# Patient Record
Sex: Female | Born: 1950 | Race: White | Hispanic: No | Marital: Married | Smoking: Never smoker
Health system: Southern US, Community
[De-identification: ages and names within clinical notes are randomized; demographics above are authoritative.]

## PROBLEM LIST (undated history)

## (undated) DIAGNOSIS — E781 Pure hyperglyceridemia: Secondary | ICD-10-CM

## (undated) DIAGNOSIS — H269 Unspecified cataract: Secondary | ICD-10-CM

## (undated) DIAGNOSIS — F329 Major depressive disorder, single episode, unspecified: Secondary | ICD-10-CM

## (undated) DIAGNOSIS — Z87442 Personal history of urinary calculi: Secondary | ICD-10-CM

## (undated) DIAGNOSIS — E119 Type 2 diabetes mellitus without complications: Secondary | ICD-10-CM

## (undated) DIAGNOSIS — L409 Psoriasis, unspecified: Secondary | ICD-10-CM

## (undated) DIAGNOSIS — G709 Myoneural disorder, unspecified: Secondary | ICD-10-CM

## (undated) DIAGNOSIS — I10 Essential (primary) hypertension: Secondary | ICD-10-CM

## (undated) DIAGNOSIS — K635 Polyp of colon: Secondary | ICD-10-CM

## (undated) DIAGNOSIS — K760 Fatty (change of) liver, not elsewhere classified: Secondary | ICD-10-CM

## (undated) DIAGNOSIS — K219 Gastro-esophageal reflux disease without esophagitis: Secondary | ICD-10-CM

## (undated) DIAGNOSIS — C801 Malignant (primary) neoplasm, unspecified: Secondary | ICD-10-CM

## (undated) DIAGNOSIS — K589 Irritable bowel syndrome without diarrhea: Secondary | ICD-10-CM

## (undated) DIAGNOSIS — E785 Hyperlipidemia, unspecified: Secondary | ICD-10-CM

## (undated) DIAGNOSIS — E669 Obesity, unspecified: Secondary | ICD-10-CM

## (undated) DIAGNOSIS — C4490 Unspecified malignant neoplasm of skin, unspecified: Secondary | ICD-10-CM

## (undated) DIAGNOSIS — T7840XA Allergy, unspecified, initial encounter: Secondary | ICD-10-CM

## (undated) DIAGNOSIS — R911 Solitary pulmonary nodule: Secondary | ICD-10-CM

## (undated) DIAGNOSIS — J449 Chronic obstructive pulmonary disease, unspecified: Secondary | ICD-10-CM

## (undated) DIAGNOSIS — R011 Cardiac murmur, unspecified: Secondary | ICD-10-CM

## (undated) DIAGNOSIS — M1990 Unspecified osteoarthritis, unspecified site: Secondary | ICD-10-CM

## (undated) DIAGNOSIS — F32A Depression, unspecified: Secondary | ICD-10-CM

## (undated) DIAGNOSIS — F419 Anxiety disorder, unspecified: Secondary | ICD-10-CM

## (undated) DIAGNOSIS — N393 Stress incontinence (female) (male): Secondary | ICD-10-CM

## (undated) HISTORY — DX: Personal history of urinary calculi: Z87.442

## (undated) HISTORY — PX: UPPER GI ENDOSCOPY: SHX6162

## (undated) HISTORY — DX: Cardiac murmur, unspecified: R01.1

## (undated) HISTORY — PX: NASAL SINUS SURGERY: SHX719

## (undated) HISTORY — DX: Allergy, unspecified, initial encounter: T78.40XA

## (undated) HISTORY — DX: Essential (primary) hypertension: I10

## (undated) HISTORY — DX: Irritable bowel syndrome, unspecified: K58.9

## (undated) HISTORY — DX: Myoneural disorder, unspecified: G70.9

## (undated) HISTORY — DX: Depression, unspecified: F32.A

## (undated) HISTORY — DX: Chronic obstructive pulmonary disease, unspecified: J44.9

## (undated) HISTORY — DX: Stress incontinence (female) (male): N39.3

## (undated) HISTORY — PX: TONSILLECTOMY: SUR1361

## (undated) HISTORY — DX: Anxiety disorder, unspecified: F41.9

## (undated) HISTORY — DX: Unspecified cataract: H26.9

## (undated) HISTORY — DX: Obesity, unspecified: E66.9

## (undated) HISTORY — DX: Unspecified osteoarthritis, unspecified site: M19.90

## (undated) HISTORY — DX: Hyperlipidemia, unspecified: E78.5

## (undated) HISTORY — PX: VENTRAL HERNIA REPAIR: SHX424

## (undated) HISTORY — DX: Polyp of colon: K63.5

## (undated) HISTORY — DX: Major depressive disorder, single episode, unspecified: F32.9

## (undated) HISTORY — DX: Psoriasis, unspecified: L40.9

## (undated) HISTORY — PX: SHOULDER SURGERY: SHX246

## (undated) HISTORY — PX: MOUTH SURGERY: SHX715

## (undated) HISTORY — PX: HERNIA REPAIR: SHX51

## (undated) HISTORY — DX: Fatty (change of) liver, not elsewhere classified: K76.0

## (undated) HISTORY — DX: Gastro-esophageal reflux disease without esophagitis: K21.9

---

## 1999-03-19 ENCOUNTER — Other Ambulatory Visit: Admission: RE | Admit: 1999-03-19 | Discharge: 1999-03-19 | Payer: Self-pay | Admitting: *Deleted

## 2000-12-16 ENCOUNTER — Encounter: Admission: RE | Admit: 2000-12-16 | Discharge: 2000-12-16 | Payer: Self-pay | Admitting: Family Medicine

## 2000-12-16 ENCOUNTER — Encounter: Payer: Self-pay | Admitting: Family Medicine

## 2002-05-27 ENCOUNTER — Encounter: Admission: RE | Admit: 2002-05-27 | Discharge: 2002-05-27 | Payer: Self-pay | Admitting: Family Medicine

## 2002-05-27 ENCOUNTER — Encounter: Payer: Self-pay | Admitting: Family Medicine

## 2003-07-15 ENCOUNTER — Encounter: Admission: RE | Admit: 2003-07-15 | Discharge: 2003-07-15 | Payer: Self-pay | Admitting: Family Medicine

## 2003-07-15 ENCOUNTER — Encounter: Payer: Self-pay | Admitting: Family Medicine

## 2003-11-16 ENCOUNTER — Ambulatory Visit (HOSPITAL_COMMUNITY): Admission: RE | Admit: 2003-11-16 | Discharge: 2003-11-16 | Payer: Self-pay | Admitting: Chiropractic Medicine

## 2005-05-30 ENCOUNTER — Ambulatory Visit: Payer: Self-pay | Admitting: Internal Medicine

## 2005-06-03 ENCOUNTER — Ambulatory Visit: Payer: Self-pay | Admitting: Cardiology

## 2005-06-03 ENCOUNTER — Encounter: Payer: Self-pay | Admitting: Internal Medicine

## 2005-06-14 ENCOUNTER — Ambulatory Visit: Payer: Self-pay | Admitting: Internal Medicine

## 2005-06-20 ENCOUNTER — Ambulatory Visit: Payer: Self-pay | Admitting: Internal Medicine

## 2005-07-25 ENCOUNTER — Ambulatory Visit: Payer: Self-pay | Admitting: Internal Medicine

## 2005-12-30 ENCOUNTER — Encounter: Admission: RE | Admit: 2005-12-30 | Discharge: 2005-12-30 | Payer: Self-pay | Admitting: Family Medicine

## 2007-01-01 ENCOUNTER — Encounter: Admission: RE | Admit: 2007-01-01 | Discharge: 2007-01-01 | Payer: Self-pay | Admitting: Family Medicine

## 2008-02-17 ENCOUNTER — Encounter (INDEPENDENT_AMBULATORY_CARE_PROVIDER_SITE_OTHER): Payer: Self-pay | Admitting: General Surgery

## 2008-02-17 ENCOUNTER — Ambulatory Visit (HOSPITAL_COMMUNITY): Admission: RE | Admit: 2008-02-17 | Discharge: 2008-02-18 | Payer: Self-pay | Admitting: General Surgery

## 2008-03-24 ENCOUNTER — Encounter: Payer: Self-pay | Admitting: Internal Medicine

## 2008-05-12 ENCOUNTER — Telehealth: Payer: Self-pay | Admitting: Internal Medicine

## 2008-05-17 DIAGNOSIS — E7800 Pure hypercholesterolemia, unspecified: Secondary | ICD-10-CM | POA: Insufficient documentation

## 2008-05-17 DIAGNOSIS — E785 Hyperlipidemia, unspecified: Secondary | ICD-10-CM | POA: Insufficient documentation

## 2008-05-17 DIAGNOSIS — K7689 Other specified diseases of liver: Secondary | ICD-10-CM

## 2008-05-17 DIAGNOSIS — I10 Essential (primary) hypertension: Secondary | ICD-10-CM | POA: Insufficient documentation

## 2008-05-17 DIAGNOSIS — N39498 Other specified urinary incontinence: Secondary | ICD-10-CM | POA: Insufficient documentation

## 2008-05-17 DIAGNOSIS — K649 Unspecified hemorrhoids: Secondary | ICD-10-CM | POA: Insufficient documentation

## 2008-05-17 DIAGNOSIS — L408 Other psoriasis: Secondary | ICD-10-CM | POA: Insufficient documentation

## 2008-05-17 DIAGNOSIS — R197 Diarrhea, unspecified: Secondary | ICD-10-CM | POA: Insufficient documentation

## 2008-05-17 DIAGNOSIS — K219 Gastro-esophageal reflux disease without esophagitis: Secondary | ICD-10-CM | POA: Insufficient documentation

## 2008-05-17 DIAGNOSIS — M1990 Unspecified osteoarthritis, unspecified site: Secondary | ICD-10-CM | POA: Insufficient documentation

## 2008-05-18 ENCOUNTER — Ambulatory Visit: Payer: Self-pay | Admitting: Internal Medicine

## 2008-05-18 DIAGNOSIS — K589 Irritable bowel syndrome without diarrhea: Secondary | ICD-10-CM | POA: Insufficient documentation

## 2008-06-04 HISTORY — PX: COLONOSCOPY: SHX174

## 2008-06-07 ENCOUNTER — Ambulatory Visit: Payer: Self-pay | Admitting: Internal Medicine

## 2008-06-07 ENCOUNTER — Encounter: Payer: Self-pay | Admitting: Internal Medicine

## 2008-06-07 DIAGNOSIS — K635 Polyp of colon: Secondary | ICD-10-CM

## 2008-06-07 HISTORY — DX: Polyp of colon: K63.5

## 2008-06-09 ENCOUNTER — Encounter: Payer: Self-pay | Admitting: Internal Medicine

## 2008-07-12 ENCOUNTER — Telehealth: Payer: Self-pay | Admitting: Internal Medicine

## 2008-08-01 ENCOUNTER — Ambulatory Visit: Payer: Self-pay | Admitting: Cardiology

## 2008-08-04 ENCOUNTER — Ambulatory Visit: Payer: Self-pay

## 2008-08-18 ENCOUNTER — Ambulatory Visit: Payer: Self-pay | Admitting: Cardiology

## 2008-10-13 ENCOUNTER — Encounter: Admission: RE | Admit: 2008-10-13 | Discharge: 2008-10-13 | Payer: Self-pay | Admitting: Internal Medicine

## 2009-11-13 ENCOUNTER — Encounter: Payer: Self-pay | Admitting: Internal Medicine

## 2010-06-19 ENCOUNTER — Ambulatory Visit: Payer: Self-pay

## 2010-12-06 NOTE — Medication Information (Signed)
Summary: Pantoprazole-Approved/[facility]  Pantoprazole-Approved/[facility]   Imported By: Sherian Rein 11/20/2009 09:50:54  _____________________________________________________________________  External Attachment:    Type:   Image     Comment:   External Document

## 2011-03-19 NOTE — Op Note (Signed)
Abigail [name], Abigail [name] NO.:  [id number]   MEDICAL RECORD NO.:  [id number]          PATIENT TYPE:  AMB   LOCATION:  DAY                          FACILITY:  [facility]   PHYSICIAN:  Adolph Pollack, M.D.DATE OF BIRTH:  Jun 02, 1951   DATE OF PROCEDURE:  02/17/2008  DATE OF DISCHARGE:                               OPERATIVE REPORT   PREOP DIAGNOSIS:  Ventral hernia.   POSTOPERATIVE DIAGNOSIS:  Ventral hernia.   PROCEDURE:  Laparoscopic ventral hernia repair with mesh.   SURGEON:  Adolph Pollack, M.D.   ANESTHESIA:  General.   INDICATIONS:  This is a 60 year old female who has had a primary ventral  hernia containing some fat and mesenteric transverse colon.  It is  becoming larger and she is now presents for repair.  We discussed the  procedure, risks and aftercare preoperatively.   TECHNIQUE:  She is seen in the holding area and then brought to the  operating room, placed supine on the operating room table and a general  anesthetic was administered.  A Foley catheter was placed in the bladder  and the abdominal wall was sterilely prepped and draped.  A 5 mm  incision was made in the left upper quadrant and using a 5 mm OptiVu  trocar access was gained to the peritoneal cavity and pneumoperitoneum  was created.  The laparoscope was inserted and no underlying visceral  injury or bleeding was noted.  I could see the hernia defect in the  epigastric area of abdominal wall containing a part of  mid transverse  colon and omentum.  A 10 mm trocar was placed in the left mid lateral  abdomen and using careful blunt dissection I grasped the omentum and was  able to reduce the hernia contents in its entirety which included part  of the transverse colon as well as omentum.  I then used the harmonic  scalpel to divide the falciform ligament and dissect it free from the  anterior abdominal wall.  I examined the transverse colon and omentum  and no bleeding or bowel injury  was noted.   Using a spinal needle I marked the four quadrants of the hernia and  measured 4 cm away from this.  I then brought a 15 x 20 cm piece of  Parietex mesh with a nonadherent barrier one side with nonadherent  barrier  into the field and cut it to appropriate size and placed four  anchoring sutures of #1 Novofil at the four quadrants of the mesh.  The  mesh was hydrated and then placed into the abdominal cavity and then  positioned so that the rough side was facing the abdominal wall and the  smooth nonadherent side was facing the viscera.  I made four stab  incisions in four quadrants in the abdominal wall and brought up the  anchoring sutures across the fascial bridge and tied these down  initially anchoring the mesh to the abdominal wall with adequate  coverage of the hernia and good overlap.  I then used spiral tacker to  further anchor the mesh with  an outer and inner layer.  I had reduced  and excised some of the hernia sac and this was sent to pathology.   Following this I inspected all quadrants and the hernia repair and the  area underneath it and no bleeding or visceral injury was noted.  I then  released the CO2 gas and watched the mesh approximate the omentum.  The  trocars were removed.   All incisions were then closed with 4-0 Monocryl subcuticular stitches.  Steri-Strips and sterile dressings were applied.   Of note was that she had some problems with her O2 saturations during  anesthesia and had some wheezing.  This was controlled with some  Ventolin.  She subsequently was extubated and taken to the recovery room  in satisfactory condition.      Adolph Pollack, M.D.  Electronically Signed     TJR/MEDQ  D:  02/17/2008  T:  02/17/2008  Job:  [id number]   cc:   Hedwig Morton. Juanda Chance, MD  [address]  [place]  [place] [address]

## 2011-03-19 NOTE — Assessment & Plan Note (Signed)
[facility]                            CARDIOLOGY OFFICE NOTE   Abigail [name], Abigail [name]                        MRN:          [id number]  DATE:08/18/2008                            DOB:          03-28-1951    Abigail [name] returns today for her dyspnea on exertion and multiple  cardiac risk factors.   We performed an exercise rest stress Myoview.  Her EF was 75% with no  scar ischemia.   However, her blood pressure jumped up to 208 systolic with a baseline of  130.  This was within the first stage of her treadmill.  Her exercise  tolerance is also significantly reduced at 5 minutes.   I have explained her that her symptoms of dyspnea on exertion is  multifactorial.  Some of this may be due to the hypertensive response,  but most of this from deconditioning in her weight.   At this point in time, I have added amlodipine 5 mg daily for __________  her hypertensive response.  This will also help her hypertension.  She  remained on Crestor with Dr. Vear Clock, which I highly approved.  She  will also stay on aspirin 81 mg a day.   Her blood pressure today was 135/75, pulse 87 and regular.   PLAN:  1. Add amlodipine 5 mg a day.  2. See [place] back p.r.n.  3. Continued risk factor modification with Dr. Loma Sender.     Thomas C. Daleen Squibb, MD, [facility]  Electronically Signed    TCW/MedQ  DD: 08/18/2008  DT: 08/18/2008  Job #: [id number]   cc:   Loma Sender

## 2011-03-19 NOTE — Assessment & Plan Note (Signed)
[facility]                            CARDIOLOGY OFFICE NOTE   NAME:WYRICKCassiopeia, Florentino                        MRN:          [id number]  DATE:08/01/2008                            DOB:          [birth year]    I was asked by Dr. Loma Sender to consult on [facility] with  dyspnea on exertion and chest pain.   HISTORY OF PRESENT ILLNESS:  Ms. Renbarger is a delightful 60 year old  married white female from [facility] who has been having some dyspnea  on exertion and some atypical chest pain.  She climbs up and down stairs  all day doing laundry.  She said she gets fairly short of breath.   She has had some sharp stabbing pain in the mid sternum and also under  her left breast.  These are usually intermittent, not exertion related,  and last only a few seconds.   She denies any orthopnea, PND, or peripheral edema.   She has multiple cardiac risk factors including age, hypertension,  hyperlipidemia, obesity, and sedentary lifestyle.  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   PAST MEDICAL HISTORY:  She does not smoke.  She does not drink.   She has had no history of DYE allergy.   CURRENT MEDICATIONS:  1. Crestor 10 mg a day.  2. Pantoprazole 40 mg a day.  3. Lisinopril and hydrochlorothiazide 20/12.5 daily.   She has no known drug allergies.   SURGICAL HISTORY:  1. Sinus surgery in 2002.  2. Shoulder surgery in 2007.  3. Abdominal hernia in 2009.   FAMILY HISTORY:  Negative for premature coronary artery disease in first-  degree relatives.   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  She  drives for [employer].  She is married and has 2 children.   REVIEW OF SYSTEMS:  History of seasonal allergies, hay fever,  constipation, stomach and bowel disorder, and reflux.  There is no other  positive review of systems.  Please refer to the HPI for others.   PHYSICAL EXAMINATION:   VITAL SIGNS:  Her blood pressure is 140/80.  Her  pulse is 73 and regular.  Her weight is 222.  She is 5 feet 2.  HEENT:  Normocephalic, atraumatic.  PERRLA.  Extraocular movements  intact.  Sclerae are clear.  Facial symmetry is normal.  NECK:  Carotids were equal bilateral without bruits.  No JVD.  Thyroid  is not enlarged.  Neck is supple.  Oral mucosa is normal.  LUNGS:  Clear to auscultation and percussion.  HEART:  A nondisplaced PMI.  Normal S1 and S2.  No gallop, rub, or  murmur.  ABDOMEN:  Soft.  Good bowel sounds.  No obvious midline bruit or  pulsatile mass.  EXTREMITIES:  Some minimal varicose veins.  No sign of DVT.  Pulses were  brisk, both dorsalis pedis and posterior tibial.  NEURO:  Intact.  SKIN:  Unremarkable.   Electrocardiogram shows sinus rhythm with poor progression in the  anterior pericardium.  ASSESSMENT AND PLAN:  1. Dyspnea on exertion.  2. Atypical chest pain.  3. Ms. Belli has multiple cardiac risk factors.   PLAN:  Exercise rest stress Myoview to reproduce her symptoms to see and  to rule out any obstructive coronary artery disease.     Thomas C. Daleen Squibb, MD, [facility]  Electronically Signed    TCW/MedQ  DD: 08/01/2008  DT: 08/02/2008  Job #: [id number]   cc:   Loma Sender

## 2011-03-20 ENCOUNTER — Ambulatory Visit: Payer: Self-pay | Admitting: Internal Medicine

## 2011-07-30 LAB — CBC
HCT: 41.5
MCV: 85
Platelets: 209
RBC: 4.88
WBC: 7

## 2011-07-30 LAB — DIFFERENTIAL
Basophils Absolute: 0
Basophils Relative: 0
Eosinophils Absolute: 0.2
Eosinophils Relative: 3
Lymphocytes Relative: 29
Lymphs Abs: 2
Monocytes Absolute: 0.5
Monocytes Relative: 7
Neutro Abs: 4.2
Neutrophils Relative %: 61

## 2011-07-30 LAB — COMPREHENSIVE METABOLIC PANEL WITH GFR
ALT: 47 — ABNORMAL HIGH
AST: 45 — ABNORMAL HIGH
Albumin: 3.7
Alkaline Phosphatase: 68
BUN: 11
CO2: 27
Calcium: 9.4
Chloride: 105
Creatinine, Ser: 0.68
GFR calc non Af Amer: >60
Glucose, Bld: 109 — ABNORMAL HIGH
Potassium: 4
Sodium: 141
Total Bilirubin: 1.2
Total Protein: 6.8

## 2011-10-14 ENCOUNTER — Ambulatory Visit (INDEPENDENT_AMBULATORY_CARE_PROVIDER_SITE_OTHER): Payer: BC Managed Care – PPO

## 2011-10-14 DIAGNOSIS — R0602 Shortness of breath: Secondary | ICD-10-CM

## 2011-10-14 DIAGNOSIS — K219 Gastro-esophageal reflux disease without esophagitis: Secondary | ICD-10-CM

## 2011-10-14 DIAGNOSIS — R05 Cough: Secondary | ICD-10-CM

## 2012-04-10 ENCOUNTER — Other Ambulatory Visit: Payer: Self-pay | Admitting: Physician Assistant

## 2012-04-10 ENCOUNTER — Ambulatory Visit (INDEPENDENT_AMBULATORY_CARE_PROVIDER_SITE_OTHER): Payer: BC Managed Care – PPO | Admitting: Family Medicine

## 2012-04-10 VITALS — BP 150/81 | HR 96 | Temp 98.6°F | Resp 18 | Ht 64.0 in | Wt 214.0 lb

## 2012-04-10 DIAGNOSIS — L408 Other psoriasis: Secondary | ICD-10-CM

## 2012-04-10 DIAGNOSIS — L409 Psoriasis, unspecified: Secondary | ICD-10-CM

## 2012-04-10 DIAGNOSIS — K219 Gastro-esophageal reflux disease without esophagitis: Secondary | ICD-10-CM

## 2012-04-10 DIAGNOSIS — J029 Acute pharyngitis, unspecified: Secondary | ICD-10-CM

## 2012-04-10 DIAGNOSIS — R509 Fever, unspecified: Secondary | ICD-10-CM

## 2012-04-10 DIAGNOSIS — K589 Irritable bowel syndrome without diarrhea: Secondary | ICD-10-CM

## 2012-04-10 DIAGNOSIS — J329 Chronic sinusitis, unspecified: Secondary | ICD-10-CM

## 2012-04-10 MED ORDER — OMEPRAZOLE 20 MG PO CPDR: 20.0000 mg | DELAYED_RELEASE_CAPSULE | Freq: Every day | ORAL | Status: DC

## 2012-04-10 MED ORDER — DICYCLOMINE HCL 20 MG PO TABS: 20.0000 mg | ORAL_TABLET | Freq: Four times a day (QID) | ORAL | Status: DC

## 2012-04-10 MED ORDER — AMOXICILLIN 875 MG PO TABS: 875.0000 mg | ORAL_TABLET | Freq: Two times a day (BID) | ORAL | Status: AC

## 2012-04-10 MED ORDER — BETAMETHASONE DIPROPIONATE 0.05 % EX LOTN: TOPICAL_LOTION | CUTANEOUS | Status: DC

## 2012-04-10 NOTE — Progress Notes (Signed)
This 60 year old woman with several problems. First of all she complains of fever overnight with sore throat and cough. She says that her throat and bronchial tubes overall. She's had a cough in the past and been diagnosed with pneumonia but has been doing well since she had her GERD treated by Dr. Chilton Si.  Patient also has IBS and would like a refill on his eye Cyclomen. She's had minimal nausea, no blood per rectum, and no abdominal pain.  Objective: Patient is in no acute distress HEENT: Unremarkable except for mucopurulent nasal discharge, she does have a rash in her left and right ear canals with scaling Chest: Clear to auscultation Heart: Regular no murmur Abdomen: Soft nontender Extremities: No edema Skin: Diffuse psoriatic rash.  Assessment: Mild psoriasis, bothersome in the ear on the right. URI symptoms consistent with the onset of bronchitis which she's had repeatedly in the past. GERD is adequately treated with the omeprazole and the IBS is adequately treated with the dicyclomine.  Plan: Amoxicillin for the upper respiratory infection symptoms Refill dicyclomine and omeprazole Betamethasone solution to the ear for psoriasis 1. GERD (gastroesophageal reflux disease)  omeprazole (PRILOSEC) 20 MG capsule  2. IBS (irritable bowel syndrome)  dicyclomine (BENTYL) 20 MG tablet  3. Sinusitis  amoxicillin (AMOXIL) 875 MG tablet  4. Fever  amoxicillin (AMOXIL) 875 MG tablet  5. Sore throat    6. Psoriasis  betamethasone dipropionate 0.05 % lotion

## 2012-04-15 NOTE — Progress Notes (Signed)
Completed prior auth for pt's omeprazole over the phone and received approval through 04/15/13. Faxed approval notice to pharm

## 2012-07-13 ENCOUNTER — Telehealth: Payer: Self-pay | Admitting: Internal Medicine

## 2012-07-13 DIAGNOSIS — Z8719 Personal history of other diseases of the digestive system: Secondary | ICD-10-CM

## 2012-07-13 DIAGNOSIS — K589 Irritable bowel syndrome without diarrhea: Secondary | ICD-10-CM

## 2012-07-13 DIAGNOSIS — K760 Fatty (change of) liver, not elsewhere classified: Secondary | ICD-10-CM

## 2012-07-13 NOTE — Telephone Encounter (Signed)
Please get CBC,C-met, amy, lipase before her appointment

## 2012-07-13 NOTE — Telephone Encounter (Signed)
Left a message for patient to call me. 

## 2012-07-13 NOTE — Telephone Encounter (Signed)
Patient calling to report pain in stomach LUQ , acid reflux and diarrhea alternating with constipation. States she may have diarrhea then is ok for 2-3 weeks then it comes back. She takes Dicyclomine prn for diarrhea which helps. She is taking Zantac prn acid reflux. She is interested in getting OV set up to get "check out and get medications refills" since she has not been here is quiet some time.Hx IBS, GERD Scheduled patient on 07/28/12 at 2:30 PM with Dr. Juanda Chance. Does she need any labs prior to OV?

## 2012-07-14 NOTE — Telephone Encounter (Signed)
Labs in [facility] and patient aware.

## 2012-07-17 ENCOUNTER — Encounter: Payer: Self-pay | Admitting: *Deleted

## 2012-07-23 ENCOUNTER — Other Ambulatory Visit (INDEPENDENT_AMBULATORY_CARE_PROVIDER_SITE_OTHER): Payer: Self-pay

## 2012-07-23 DIAGNOSIS — K589 Irritable bowel syndrome without diarrhea: Secondary | ICD-10-CM

## 2012-07-23 DIAGNOSIS — Z8719 Personal history of other diseases of the digestive system: Secondary | ICD-10-CM

## 2012-07-23 DIAGNOSIS — K760 Fatty (change of) liver, not elsewhere classified: Secondary | ICD-10-CM

## 2012-07-23 DIAGNOSIS — K7689 Other specified diseases of liver: Secondary | ICD-10-CM

## 2012-07-23 LAB — CBC WITH DIFFERENTIAL/PLATELET
Basophils Absolute: 0 10*3/uL (ref 0.0–0.1)
Lymphocytes Relative: 31.9 % (ref 12.0–46.0)
Lymphs Abs: 2.3 10*3/uL (ref 0.7–4.0)
Monocytes Relative: 7.5 % (ref 3.0–12.0)
Neutrophils Relative %: 54.9 % (ref 43.0–77.0)
Platelets: 197 10*3/uL (ref 150.0–400.0)
RDW: 12.8 % (ref 11.5–14.6)
WBC: 7.1 10*3/uL (ref 4.5–10.5)

## 2012-07-23 LAB — COMPREHENSIVE METABOLIC PANEL
ALT: 39 U/L — ABNORMAL HIGH (ref 0–35)
Albumin: 3.9 g/dL (ref 3.5–5.2)
CO2: 25 mEq/L (ref 19–32)
Chloride: 104 mEq/L (ref 96–112)
GFR: 86.92 mL/min (ref >60.00)
Potassium: 3.6 mEq/L (ref 3.5–5.1)
Sodium: 139 mEq/L (ref 135–145)
Total Bilirubin: 0.7 mg/dL (ref 0.3–1.2)
Total Protein: 6.8 g/dL (ref 6.0–8.3)

## 2012-07-28 ENCOUNTER — Ambulatory Visit (INDEPENDENT_AMBULATORY_CARE_PROVIDER_SITE_OTHER): Payer: Self-pay | Admitting: Internal Medicine

## 2012-07-28 ENCOUNTER — Encounter: Payer: Self-pay | Admitting: Internal Medicine

## 2012-07-28 VITALS — BP 170/88 | HR 96 | Ht 62.75 in | Wt 218.0 lb

## 2012-07-28 DIAGNOSIS — K760 Fatty (change of) liver, not elsewhere classified: Secondary | ICD-10-CM

## 2012-07-28 DIAGNOSIS — K219 Gastro-esophageal reflux disease without esophagitis: Secondary | ICD-10-CM

## 2012-07-28 DIAGNOSIS — K7689 Other specified diseases of liver: Secondary | ICD-10-CM

## 2012-07-28 DIAGNOSIS — R932 Abnormal findings on diagnostic imaging of liver and biliary tract: Secondary | ICD-10-CM

## 2012-07-28 DIAGNOSIS — K3189 Other diseases of stomach and duodenum: Secondary | ICD-10-CM

## 2012-07-28 MED ORDER — FLUCONAZOLE 100 MG PO TABS: 100.0000 mg | ORAL_TABLET | Freq: Every day | ORAL | Status: DC

## 2012-07-28 MED ORDER — OMEPRAZOLE 40 MG PO CPDR: 40.0000 mg | DELAYED_RELEASE_CAPSULE | Freq: Every day | ORAL | Status: DC

## 2012-07-28 NOTE — Progress Notes (Signed)
Abigail [name] [birth year] MRN [id number]  History of Present Illness:  This is a 61 year old white female with an gastroesophageal reflux exacerbation  with dyspepsia, bloating, cough and heartburn. She also has a history of irritable bowel syndrome with alternating diarrhea and constipation for which she takes Bentyl 20 mg twice a day. She was given Prilosec 20 mg a day by Dr. Chilton [name] (out of the prescription). She is currently taking ranitidine 150 mg when necessary. We have seen her in the past for ventral hernia which was repaired by Dr. Abbey [name] in 2009. She had a colonoscopy in August 2009 with findings of a hyperplastic polyp and lipoma. A CT scan of the abdomen in July 2006 showed fatty liver and a left renal adenoma as well as a thickened gallbladder wall, supraventral umbilical hernia and right inguinal hernia. Her recent blood tests showed mild elevation of AST at 37 and ALT at 39, with normal hemoglobin of 15 and hematocrit at 41.5 and normal amylase and lipase.   Past Medical History  Diagnosis Date  . Hemorrhoids   . Psoriasis   . Arthritis   . Female stress incontinence   . Fatty liver   . Hypertension   . Hyperlipidemia   . GERD (gastroesophageal reflux disease)   . IBS (irritable bowel syndrome)   . Hyperplastic colon polyp   . COPD (chronic obstructive pulmonary disease)   . Obesity    Past Surgical History  Procedure Date  . Shoulder surgery     right  . Ventral hernia repair   . Nasal sinus surgery   . Tonsillectomy   . Mouth surgery     reports that she has never smoked. She has never used smokeless tobacco. She reports that she does not drink alcohol or use illicit drugs. family history includes Breast cancer in her mother; Colon polyps in her maternal aunt; and Heart disease in her maternal grandfather. No Known Allergies      Review of Systems: Denies dysphagia. Positive for cough and regurgitation. Positive for diarrhea constipation  The remainder of the  10 point ROS is negative except as outlined in H&P   Physical Exam: General appearance  Well developed, in no distress. Overweight Eyes- non icteric. HEENT nontraumatic, normocephalic. Mouth no lesions, tongue papillated, no cheilosis. Neck supple without adenopathy, thyroid not enlarged, no carotid bruits, no JVD. Lungs Clear to auscultation bilaterally. Cor normal S1, normal S2, regular rhythm, no murmur,  quiet precordium. Abdomen: Obese, soft with tenderness in midepigastrium and left lower quadrant. Normoactive bowel sounds. No mass. Rectal: External hemorrhoidal tags tender anal canal. Soft Hemoccult negative stool. Extremities no pedal edema. Skin psoriasis on the buttocks and the abdomen, extensive candida dermatitis under both breast  Neurological alert and oriented x 3. Psychological normal mood and affect.  Assessment and Plan:  Problem #1 Increased gastroesophageal reflux poorly controlled on ranitidine 150 mg a day. We will switch her to omeprazole 40 mg daily and have advised her to follow strict antireflux measures which includes weight loss. We will schedule her for an upper endoscopy. Because of bloating and dyspepsia and abnormal thickened gallbladder wall on a previous CT scan of the abdomen, we will proceed with an upper abdominal ultrasound.  Problem #2 Fatty liver by history documented on a CT scan of the abdomen in 2006. Mild elevation of transaminases are consistent with fatty liver. There are no stigmata of chronic liver disease on her physical exam.  Problem #3 Colorectal screening. Her last colonoscopy was in  August 2009. Patient had a hyperplastic polyp. A recall colonoscopy will be due 7-10 years from that date.  Problem #4 Irritable bowel syndrome. Patient is to continue Bentyl 20 mg twice a day.    07/28/2012 Abigail [name]

## 2012-07-28 NOTE — Patient Instructions (Addendum)
You have been scheduled for an endoscopy with propofol. Please follow written instructions given to you at your visit today. If you use inhalers (even only as needed), please bring them with you on the day of your procedure. You have been scheduled for an abdominal ultrasound at [facility] (1st floor of hospital) on 07-31-2012 at 9:30 am. Please arrive 15 minutes prior to your appointment for registration. Make certain not to have anything to eat or drink 6 hours prior to your appointment. Should you need to reschedule your appointment, please contact radiology at [phone]. We have sent the following medications to your pharmacy for you to pick up at your convenience:Diflucan and Omeprazole     CC: Dr Loma Sender, Dr Denny Levy

## 2012-07-31 ENCOUNTER — Ambulatory Visit (HOSPITAL_COMMUNITY)
Admission: RE | Admit: 2012-07-31 | Discharge: 2012-07-31 | Disposition: A | Discharge status: Home / Self Care | Payer: BC Managed Care – PPO | Source: Ambulatory Visit | Attending: Internal Medicine | Admitting: Internal Medicine

## 2012-07-31 DIAGNOSIS — K7689 Other specified diseases of liver: Secondary | ICD-10-CM | POA: Insufficient documentation

## 2012-07-31 DIAGNOSIS — R1013 Epigastric pain: Secondary | ICD-10-CM

## 2012-07-31 DIAGNOSIS — R932 Abnormal findings on diagnostic imaging of liver and biliary tract: Secondary | ICD-10-CM

## 2012-07-31 DIAGNOSIS — K219 Gastro-esophageal reflux disease without esophagitis: Secondary | ICD-10-CM

## 2012-08-12 ENCOUNTER — Ambulatory Visit (AMBULATORY_SURGERY_CENTER): Payer: BC Managed Care – PPO | Admitting: Internal Medicine

## 2012-08-12 ENCOUNTER — Other Ambulatory Visit: Payer: Self-pay | Admitting: Internal Medicine

## 2012-08-12 ENCOUNTER — Encounter: Payer: Self-pay | Admitting: Internal Medicine

## 2012-08-12 VITALS — BP 153/87 | HR 75 | Temp 98.6°F | Resp 16 | Ht 62.0 in | Wt 218.0 lb

## 2012-08-12 DIAGNOSIS — K219 Gastro-esophageal reflux disease without esophagitis: Secondary | ICD-10-CM

## 2012-08-12 DIAGNOSIS — K2960 Other gastritis without bleeding: Secondary | ICD-10-CM

## 2012-08-12 DIAGNOSIS — K2900 Acute gastritis without bleeding: Secondary | ICD-10-CM

## 2012-08-12 MED ORDER — SODIUM CHLORIDE 0.9 % IV SOLN: 500.0000 mL | INTRAVENOUS | Status: DC

## 2012-08-12 NOTE — Progress Notes (Signed)
Patient did not experience any of the following events: a burn prior to discharge; a fall within the facility; wrong site/side/patient/procedure/implant event; or a hospital transfer or hospital admission upon discharge from the facility. (G8907) Patient did not have preoperative order for IV antibiotic SSI prophylaxis. (G8918)  

## 2012-08-12 NOTE — Patient Instructions (Addendum)
Handouts were given to your care partner on gastritis and GERD.  You may resume your medications today.  Please call if any questions or concerns.  Continue your acid reducing medication per Dr. Juanda Chance.    YOU HAD AN ENDOSCOPIC PROCEDURE TODAY AT THE [facility]: Refer to the procedure report that was given to you for any specific questions about what was found during the examination.  If the procedure report does not answer your questions, please call your gastroenterologist to clarify.  If you requested that your care partner not be given the details of your procedure findings, then the procedure report has been included in a sealed envelope for you to review at your convenience later.  YOU SHOULD EXPECT: Some feelings of bloating in the abdomen. Passage of more gas than usual.  Walking can help get rid of the air that was put into your GI tract during the procedure and reduce the bloating. If you had a lower endoscopy (such as a colonoscopy or flexible sigmoidoscopy) you may notice spotting of blood in your stool or on the toilet paper. If you underwent a bowel prep for your procedure, then you may not have a normal bowel movement for a few days.  DIET: Your first meal following the procedure should be a light meal and then it is ok to progress to your normal diet.  A half-sandwich or bowl of soup is an example of a good first meal.  Heavy or fried foods are harder to digest and may make you feel nauseous or bloated.  Likewise meals heavy in dairy and vegetables can cause extra gas to form and this can also increase the bloating.  Drink plenty of fluids but you should avoid alcoholic beverages for 24 hours.  ACTIVITY: Your care partner should take you home directly after the procedure.  You should plan to take it easy, moving slowly for the rest of the day.  You can resume normal activity the day after the procedure however you should NOT DRIVE or use heavy machinery for 24 hours (because  of the sedation medicines used during the test).    SYMPTOMS TO REPORT IMMEDIATELY: A gastroenterologist can be reached at any hour.  During normal business hours, 8:30 AM to 5:00 PM Monday through Friday, call [phone].  After hours and on weekends, please call the GI answering service at [phone] who will take a message and have the physician on call contact you.     Following upper endoscopy (EGD)  Vomiting of blood or coffee ground material  New chest pain or pain under the shoulder blades  Painful or persistently difficult swallowing  New shortness of breath  Fever of 100F or higher  Black, tarry-looking stools  FOLLOW UP: If any biopsies were taken you will be contacted by phone or by letter within the next 1-3 weeks.  Call your gastroenterologist if you have not heard about the biopsies in 3 weeks.  Our staff will call the home number listed on your records the next business day following your procedure to check on you and address any questions or concerns that you may have at that time regarding the information given to you following your procedure. This is a courtesy call and so if there is no answer at the home number and we have not heard from you through the emergency physician on call, we will assume that you have returned to your regular daily activities without incident.  SIGNATURES/CONFIDENTIALITY: You and/or your  care partner have signed paperwork which will be entered into your electronic medical record.  These signatures attest to the fact that that the information above on your After Visit Summary has been reviewed and is understood.  Full responsibility of the confidentiality of this discharge information lies with you and/or your care-partner.

## 2012-08-12 NOTE — Op Note (Signed)
[facility] [address] [place], [address]   ENDOSCOPY PROCEDURE REPORT  PATIENT: Modest, Draeger  MR#: [id number] BIRTHDATE: 11/05/1950 , 61  yrs. old GENDER: Female ENDOSCOPIST: Hart Carwin, MD REFERRED BY:  Elvina Sidle, M.D. PROCEDURE DATE:  08/12/2012 PROCEDURE:  EGD w/ biopsy ASA CLASS:     Class II INDICATIONS:  dyspepsia.   heartburn.   improved since Prilosec 40 mg started, abd.  sono is negative. MEDICATIONS: MAC sedation, administered by CRNA and Propofol (Diprivan) 250 mg IV TOPICAL ANESTHETIC: Cetacaine Spray  DESCRIPTION OF PROCEDURE: After the risks benefits and alternatives of the procedure were thoroughly explained, informed consent was obtained.  The LB GIF-H180 [id number] endoscope was introduced through the mouth and advanced to the second portion of the duodenum. Without limitations.  The instrument was slowly withdrawn as the mucosa was fully examined.    Esophagus  Irregular z-line. , biopsies taken to r/o barrett's esophagus  STOMACH: Mild gastritis (inflammation) was found.  A biopsy was performed using cold forceps.  Sample obtained for helicobacter pylori testing.  Retroflexed views revealed no abnormalities. The scope was then withdrawn from the patient and the procedure completed.  COMPLICATIONS: There were no complications. ENDOSCOPIC IMPRESSION: 1.   Irregular z-line , biopsies taken to r/o barrett's esophagus 2.   Gastritis (inflammation) was found; biopsy to r/o H.pylori  RECOMMENDATIONS: 1.  Await pathology results 2.  continue PPI 3.  anti-reflux regimen to be follow  REPEAT EXAM:    no  recall  eSigned:  Hart Carwin, MD 08/12/2012 8:33 AM   CC:  PATIENT NAME:  Daryn, Pisani MR#: [id number]

## 2012-08-12 NOTE — Progress Notes (Signed)
No complaints noted in the recovery room. Maw   

## 2012-08-13 ENCOUNTER — Telehealth: Payer: Self-pay | Admitting: *Deleted

## 2012-08-13 NOTE — Telephone Encounter (Signed)
No identifier, no answer. Message left to call if questions or concerns.

## 2012-08-17 ENCOUNTER — Encounter: Payer: Self-pay | Admitting: Internal Medicine

## 2012-10-05 ENCOUNTER — Ambulatory Visit (HOSPITAL_COMMUNITY)
Admission: RE | Admit: 2012-10-05 | Discharge: 2012-10-05 | Disposition: A | Discharge status: Home / Self Care | Payer: BC Managed Care – PPO | Source: Ambulatory Visit | Attending: Family Medicine | Admitting: Family Medicine

## 2012-10-05 ENCOUNTER — Ambulatory Visit (INDEPENDENT_AMBULATORY_CARE_PROVIDER_SITE_OTHER): Payer: BC Managed Care – PPO | Admitting: Family Medicine

## 2012-10-05 ENCOUNTER — Other Ambulatory Visit: Payer: Self-pay | Admitting: *Deleted

## 2012-10-05 VITALS — BP 160/96 | HR 87 | Temp 98.1°F | Resp 18 | Wt 218.0 lb

## 2012-10-05 DIAGNOSIS — M79609 Pain in unspecified limb: Secondary | ICD-10-CM | POA: Insufficient documentation

## 2012-10-05 DIAGNOSIS — M79669 Pain in unspecified lower leg: Secondary | ICD-10-CM

## 2012-10-05 DIAGNOSIS — S86819A Strain of other muscle(s) and tendon(s) at lower leg level, unspecified leg, initial encounter: Secondary | ICD-10-CM

## 2012-10-05 DIAGNOSIS — S86119A Strain of other muscle(s) and tendon(s) of posterior muscle group at lower leg level, unspecified leg, initial encounter: Secondary | ICD-10-CM

## 2012-10-05 DIAGNOSIS — S838X9A Sprain of other specified parts of unspecified knee, initial encounter: Secondary | ICD-10-CM

## 2012-10-05 MED ORDER — TRAMADOL HCL 50 MG PO TABS: 50.0000 mg | ORAL_TABLET | Freq: Three times a day (TID) | ORAL | Status: DC | PRN

## 2012-10-05 NOTE — Patient Instructions (Signed)
Go to come in hospital 11:15 AM to get your ultrasound  Be cautious with avoiding excessive use of the leg, especially on stairs. Use a cane if necessary.  If the ibuprofen upsets her stomach, switch to Tylenol extra strength 2 tablets 3 times daily.  Use the tramadol for severe pain only  Return at any time if worse, otherwise in about one week if you're not improving.

## 2012-10-05 NOTE — Progress Notes (Signed)
Subjective: Patient has been having problems with some tightness of the muscle in the right calf. 9 days ago she ran up a bank in front of her house and felt a pop in the calf. It is continued to hurt quite a lot. It aches when she is often at night a little bit worse. She has no history of blood clots.  Objective: Medial thighs nontender. The calf is quite tender, especially of the low to mid gastrocnemius muscle. There is bruising down around the ankle. She can dorsiflex without problems, but plantar flexion causes pain. Motor strength is good  Assessment: Tear of gastrocnemius muscle Calf pain  Plan: Ultrasound calf to make sure she does not have a clot since she continues to hurt her  She has a history of gastritis, has been taking one ibuprofen 3 times a day. I told her she can do that but it might tend to upset her stomach if we put her on a higher dose of it. Therefore give her some Tylenol for when it is hurting badly. Advance activity cautiously. She is 24 steps or home, so needs to be slow and careful with this

## 2012-10-05 NOTE — Progress Notes (Signed)
VASCULAR LAB PRELIMINARY  PRELIMINARY  PRELIMINARY  PRELIMINARY  Right lower extremity venous Doppler completed.    Preliminary report:  There is no DVT or SVT noted in the right lower extremity.  There is a possible muscle tear mid calf with no active bleeding noted.  Katharine Rochefort, 10/05/2012, 11:56 AM

## 2012-10-09 ENCOUNTER — Telehealth: Payer: Self-pay | Admitting: Internal Medicine

## 2012-10-09 NOTE — Telephone Encounter (Signed)
Left a message for patient to call me. 

## 2012-10-09 NOTE — Telephone Encounter (Signed)
I agree with increasing Prilosec to 20 mg po bid, #60, 3 refills.

## 2012-10-09 NOTE — Telephone Encounter (Signed)
Patient had EGD on 08/12/12 and was told she had gastritis. She has been taking Prilosec 40 mg/daily. She is having acid reflux and nausea during the night. She reports one episode of reflux during the day. She will increase her Prilosec to BID. Please, advise of any other suggestions.

## 2012-10-12 MED ORDER — OMEPRAZOLE 40 MG PO CPDR: 40.0000 mg | DELAYED_RELEASE_CAPSULE | Freq: Two times a day (BID) | ORAL | Status: DC

## 2012-10-12 NOTE — Telephone Encounter (Signed)
Patient notified of Dr. Brodie's recommendations. 

## 2012-10-12 NOTE — Telephone Encounter (Signed)
Left a message for patient to call me. New rx sent to patient's pharmacy.

## 2013-02-18 ENCOUNTER — Telehealth: Payer: Self-pay | Admitting: Internal Medicine

## 2013-02-18 NOTE — Telephone Encounter (Signed)
Patient wants to decrease to Prilosec daily instead of BID.  She will try this and if this does not help she will return to BID

## 2013-05-20 ENCOUNTER — Ambulatory Visit (INDEPENDENT_AMBULATORY_CARE_PROVIDER_SITE_OTHER): Payer: BC Managed Care – PPO | Admitting: Family Medicine

## 2013-05-20 ENCOUNTER — Observation Stay (HOSPITAL_COMMUNITY)
Admission: EM | Admit: 2013-05-20 | Discharge: 2013-05-21 | Disposition: A | Discharge status: Home / Self Care | Payer: BC Managed Care – PPO | Attending: Cardiovascular Disease | Admitting: Cardiovascular Disease

## 2013-05-20 ENCOUNTER — Emergency Department (HOSPITAL_COMMUNITY): Payer: BC Managed Care – PPO

## 2013-05-20 ENCOUNTER — Observation Stay (HOSPITAL_COMMUNITY): Payer: BC Managed Care – PPO

## 2013-05-20 ENCOUNTER — Ambulatory Visit: Payer: BC Managed Care – PPO

## 2013-05-20 ENCOUNTER — Encounter (HOSPITAL_COMMUNITY): Payer: Self-pay | Admitting: *Deleted

## 2013-05-20 VITALS — BP 148/88 | HR 90 | Temp 97.9°F | Resp 18 | Ht 62.0 in | Wt 214.2 lb

## 2013-05-20 DIAGNOSIS — Z7982 Long term (current) use of aspirin: Secondary | ICD-10-CM | POA: Insufficient documentation

## 2013-05-20 DIAGNOSIS — R7989 Other specified abnormal findings of blood chemistry: Secondary | ICD-10-CM | POA: Diagnosis present

## 2013-05-20 DIAGNOSIS — H9209 Otalgia, unspecified ear: Secondary | ICD-10-CM | POA: Insufficient documentation

## 2013-05-20 DIAGNOSIS — R911 Solitary pulmonary nodule: Secondary | ICD-10-CM | POA: Insufficient documentation

## 2013-05-20 DIAGNOSIS — Z79899 Other long term (current) drug therapy: Secondary | ICD-10-CM | POA: Insufficient documentation

## 2013-05-20 DIAGNOSIS — R079 Chest pain, unspecified: Secondary | ICD-10-CM

## 2013-05-20 DIAGNOSIS — R0602 Shortness of breath: Secondary | ICD-10-CM | POA: Insufficient documentation

## 2013-05-20 DIAGNOSIS — R42 Dizziness and giddiness: Secondary | ICD-10-CM

## 2013-05-20 DIAGNOSIS — R202 Paresthesia of skin: Secondary | ICD-10-CM

## 2013-05-20 DIAGNOSIS — H9203 Otalgia, bilateral: Secondary | ICD-10-CM

## 2013-05-20 DIAGNOSIS — R0789 Other chest pain: Principal | ICD-10-CM | POA: Insufficient documentation

## 2013-05-20 DIAGNOSIS — J449 Chronic obstructive pulmonary disease, unspecified: Secondary | ICD-10-CM | POA: Insufficient documentation

## 2013-05-20 DIAGNOSIS — I10 Essential (primary) hypertension: Secondary | ICD-10-CM

## 2013-05-20 DIAGNOSIS — J4489 Other specified chronic obstructive pulmonary disease: Secondary | ICD-10-CM | POA: Insufficient documentation

## 2013-05-20 DIAGNOSIS — R9431 Abnormal electrocardiogram [ECG] [EKG]: Secondary | ICD-10-CM

## 2013-05-20 DIAGNOSIS — E781 Pure hyperglyceridemia: Secondary | ICD-10-CM | POA: Insufficient documentation

## 2013-05-20 DIAGNOSIS — R739 Hyperglycemia, unspecified: Secondary | ICD-10-CM

## 2013-05-20 HISTORY — DX: Malignant (primary) neoplasm, unspecified: C80.1

## 2013-05-20 HISTORY — DX: Unspecified malignant neoplasm of skin, unspecified: C44.90

## 2013-05-20 HISTORY — DX: Pure hyperglyceridemia: E78.1

## 2013-05-20 HISTORY — DX: Solitary pulmonary nodule: R91.1

## 2013-05-20 HISTORY — DX: Type 2 diabetes mellitus without complications: E11.9

## 2013-05-20 LAB — CBC WITH DIFFERENTIAL/PLATELET
Basophils Relative: 0 % (ref 0–1)
Eosinophils Absolute: 0.2 10*3/uL (ref 0.0–0.7)
HCT: 40.1 % (ref 36.0–46.0)
Hemoglobin: 14.7 g/dL (ref 12.0–15.0)
MCH: 30.5 pg (ref 26.0–34.0)
MCHC: 36.7 g/dL — ABNORMAL HIGH (ref 30.0–36.0)
Monocytes Absolute: 0.4 10*3/uL (ref 0.1–1.0)
Monocytes Relative: 7 % (ref 3–12)
Neutrophils Relative %: 54 % (ref 43–77)
RDW: 12.3 % (ref 11.5–15.5)

## 2013-05-20 LAB — BASIC METABOLIC PANEL
BUN: 11 mg/dL (ref 6–23)
Creatinine, Ser: 0.54 mg/dL (ref 0.50–1.10)
GFR calc non Af Amer: >90 mL/min (ref >90)

## 2013-05-20 LAB — POCT GLYCOSYLATED HEMOGLOBIN (HGB A1C): Hemoglobin A1C: 6.6

## 2013-05-20 LAB — LIPID PANEL
HDL: 41 mg/dL (ref >39)
Total CHOL/HDL Ratio: 6.5 Ratio

## 2013-05-20 LAB — POCT CBC
Lymph, poc: 2 (ref 0.6–3.4)
MCH, POC: 29.7 pg (ref 27–31.2)
MCHC: 33.1 g/dL (ref 31.8–35.4)
MID (cbc): 0.4 (ref 0–0.9)
MPV: 10.2 fL (ref 0–99.8)
POC MID %: 7.1 %M (ref 0–12)
Platelet Count, POC: 191 10*3/uL (ref 142–424)
WBC: 5.9 10*3/uL (ref 4.6–10.2)

## 2013-05-20 LAB — COMPREHENSIVE METABOLIC PANEL
ALT: 28 U/L (ref 0–35)
AST: 30 U/L (ref 0–37)
CO2: 25 mEq/L (ref 19–32)
Creat: 0.63 mg/dL (ref 0.50–1.10)
Total Bilirubin: 0.8 mg/dL (ref 0.3–1.2)

## 2013-05-20 LAB — GLUCOSE, CAPILLARY: Glucose-Capillary: 121 mg/dL — ABNORMAL HIGH (ref 70–99)

## 2013-05-20 LAB — D-DIMER, QUANTITATIVE: D-Dimer, Quant: 1.51 ug/mL-FEU — ABNORMAL HIGH (ref 0.00–0.48)

## 2013-05-20 LAB — TROPONIN I
Troponin I: <0.3 ng/mL (ref <0.30)
Troponin I: <0.3 ng/mL (ref <0.30)

## 2013-05-20 LAB — TSH: TSH: 1.088 u[IU]/mL (ref 0.350–4.500)

## 2013-05-20 LAB — POCT I-STAT TROPONIN I: Troponin i, poc: 0 ng/mL (ref 0.00–0.08)

## 2013-05-20 MED ORDER — AMOXICILLIN-POT CLAVULANATE 875-125 MG PO TABS
1.0000 | ORAL_TABLET | Freq: Two times a day (BID) | ORAL | Status: DC
  Administered 2013-05-20 – 2013-05-21 (×2): 1 via ORAL
  Filled 2013-05-20 (×3): qty 1

## 2013-05-20 MED ORDER — AMOXICILLIN 250 MG PO CHEW
875.0000 mg | CHEWABLE_TABLET | Freq: Two times a day (BID) | ORAL | Status: DC
  Filled 2013-05-20: qty 4

## 2013-05-20 MED ORDER — ASPIRIN 81 MG PO TABS: 81.0000 mg | ORAL_TABLET | Freq: Every day | ORAL | Status: DC

## 2013-05-20 MED ORDER — ASPIRIN EC 81 MG PO TBEC
81.0000 mg | DELAYED_RELEASE_TABLET | Freq: Every day | ORAL | Status: DC
  Administered 2013-05-21: 81 mg via ORAL
  Filled 2013-05-20: qty 1

## 2013-05-20 MED ORDER — NITROGLYCERIN 0.4 MG SL SUBL: 0.4000 mg | SUBLINGUAL_TABLET | SUBLINGUAL | Status: DC | PRN

## 2013-05-20 MED ORDER — HYDROCHLOROTHIAZIDE 12.5 MG PO CAPS
12.5000 mg | ORAL_CAPSULE | Freq: Every day | ORAL | Status: DC
  Administered 2013-05-20 – 2013-05-21 (×2): 12.5 mg via ORAL
  Filled 2013-05-20 (×2): qty 1

## 2013-05-20 MED ORDER — LISINOPRIL-HYDROCHLOROTHIAZIDE 10-12.5 MG PO TABS: 1.0000 | ORAL_TABLET | Freq: Every day | ORAL | Status: DC

## 2013-05-20 MED ORDER — DICYCLOMINE HCL 20 MG PO TABS
20.0000 mg | ORAL_TABLET | Freq: Four times a day (QID) | ORAL | Status: DC | PRN
  Filled 2013-05-20: qty 1

## 2013-05-20 MED ORDER — HEPARIN SODIUM (PORCINE) 5000 UNIT/ML IJ SOLN
5000.0000 [IU] | Freq: Three times a day (TID) | INTRAMUSCULAR | Status: DC
  Administered 2013-05-20 – 2013-05-21 (×2): 5000 [IU] via SUBCUTANEOUS
  Filled 2013-05-20 (×5): qty 1

## 2013-05-20 MED ORDER — ASPIRIN 81 MG PO CHEW: 324.0000 mg | CHEWABLE_TABLET | ORAL | Status: DC

## 2013-05-20 MED ORDER — LISINOPRIL 10 MG PO TABS
10.0000 mg | ORAL_TABLET | Freq: Every day | ORAL | Status: DC
  Administered 2013-05-20 – 2013-05-21 (×2): 10 mg via ORAL
  Filled 2013-05-20 (×2): qty 1

## 2013-05-20 MED ORDER — SODIUM CHLORIDE 0.9 % IJ SOLN
3.0000 mL | Freq: Two times a day (BID) | INTRAMUSCULAR | Status: DC
  Administered 2013-05-20: 3 mL via INTRAVENOUS

## 2013-05-20 MED ORDER — SODIUM CHLORIDE 0.9 % IV SOLN: 250.0000 mL | INTRAVENOUS | Status: DC | PRN

## 2013-05-20 MED ORDER — SODIUM CHLORIDE 0.9 % IV SOLN: INTRAVENOUS | Status: DC

## 2013-05-20 MED ORDER — ONDANSETRON HCL 4 MG/2ML IJ SOLN: 4.0000 mg | Freq: Four times a day (QID) | INTRAMUSCULAR | Status: DC | PRN

## 2013-05-20 MED ORDER — AMOXICILLIN 875 MG PO TABS: 875.0000 mg | ORAL_TABLET | Freq: Two times a day (BID) | ORAL | Status: DC

## 2013-05-20 MED ORDER — SODIUM CHLORIDE 0.9 % IJ SOLN: 3.0000 mL | INTRAMUSCULAR | Status: DC | PRN

## 2013-05-20 MED ORDER — ACETAMINOPHEN 325 MG PO TABS: 650.0000 mg | ORAL_TABLET | ORAL | Status: DC | PRN

## 2013-05-20 MED ORDER — IOHEXOL 350 MG/ML SOLN
100.0000 mL | Freq: Once | INTRAVENOUS | Status: AC | PRN
  Administered 2013-05-20: 100 mL via INTRAVENOUS

## 2013-05-20 MED ORDER — PANTOPRAZOLE SODIUM 40 MG PO TBEC
80.0000 mg | DELAYED_RELEASE_TABLET | Freq: Two times a day (BID) | ORAL | Status: DC
  Administered 2013-05-20 – 2013-05-21 (×2): 80 mg via ORAL
  Filled 2013-05-20 (×2): qty 2

## 2013-05-20 NOTE — Patient Instructions (Signed)
Can follow up with me after you are evaluated in the hospital to discuss your elevated blood sugar and tingling in feet, and to recheck your ears.

## 2013-05-20 NOTE — ED Notes (Signed)
Patient transported to X-ray 

## 2013-05-20 NOTE — ED Notes (Signed)
From Urgent care- C/o intermittent left side CP with radiation into LUE & neck x 6 months. Given NTG SL x1 with some relief & ASA 324mg 

## 2013-05-20 NOTE — Progress Notes (Signed)
Subjective:    Patient ID: Abigail [name], female    DOB: 05/09/1951, 62 y.o.   MRN: [id number]  HPI Abigail [name] is a 62 y.o. female Here for multiple concerns today. I am identified as PCP (last ov with me for cough in 2012), prior pt of Dr. Loma Sender, but no recent ov.     Main concern - tingling in toes - off and on for awhile - 6 months, and wants to have blood sugar checked.  No recent fasting labs. No known hx of DM2.  Is fasting this am.  Occasional blurry vision. no change in urination.    Chest pain - L side of breast - past 6 months. Takes Prilosec 1-2 times per day. Heartburn has been controlled. Hx of gastritis. Not able to reproduce pushing on it, but some soreness with movement at times. No direct FH of CAD - only in grandparent..   FH of breast cancer - mom, last MMG 3 years ago - normal.  Scheduled for 05/31/13. No n/v/sob.  Does note chest pain with exertion at times. Can last all day at times.  Sharp with breath at times. No heaviness/pressure.  Sweating all the time, not specifically associated with chest sx's. Does do a lot of lifting - seems more sore afterwards.  No hx of PUD. Takes 81mg  ASA QD, not taken yet today.   Hx of HTN - missed dose 2 days ago. Home bp's - 140-160/90's. Hx of heart murmur - unknown cause "told not to worry about it"   Hx of psoriasis - itching in ears, causing swelling in ears - sometimes flares to soreness, feels like infection in ears - past few weeks. Has dermatologist.  Has steroid cream? R greater than L ear, soreness. Has been using steroid cream - did not bring here today.   Vertigo - with ear infections in past - few spells few weeks ago - none today.  Prior sx's resolved with "dizziness" medicine.   Review of Systems  Constitutional: Positive for diaphoresis (chronic.). Negative for fever and chills.  HENT: Positive for hearing loss (feels blocked. ) and ear pain.   Respiratory: Negative for cough and shortness of breath.     Cardiovascular: Positive for chest pain. Negative for leg swelling.  Gastrointestinal: Negative for abdominal pain.  Genitourinary: Positive for frequency (chronic. ).  Skin: Negative for rash.       Objective:   Physical Exam  Vitals reviewed. Constitutional: She is oriented to person, place, and time. She appears well-developed and well-nourished.  HENT:  Head: Normocephalic and atraumatic.  Right Ear: Tympanic membrane is not erythematous. A middle ear effusion is present.  Left Ear: Tympanic membrane is not erythematous. A middle ear effusion is present.  Ears:  Mouth/Throat: Oropharynx is clear and moist.  Eyes: Conjunctivae and EOM are normal. Pupils are equal, round, and reactive to light.  Neck: Carotid bruit is not present. No thyromegaly present.  Cardiovascular: Normal rate, regular rhythm and intact distal pulses.   Murmur (2-3/6 at LUSB, systolic.) heard. Pulmonary/Chest: Effort normal and breath sounds normal. She exhibits tenderness. She exhibits no mass. Left breast exhibits no inverted nipple and no mass.    Abdominal: Soft. She exhibits no pulsatile midline mass. There is no tenderness.  Neurological: She is alert and oriented to person, place, and time.  Skin: Skin is warm and dry.  Psychiatric: She has a normal mood and affect. Her behavior is normal.    EKG: SR, probable  LVH, possible old inferior infarct. No prior EKG available for review.   [facility] reading (PRIMARY) by  Dr. Abel Presto: cardiomegaly. Decreased inspiration.   Results for orders placed in visit on 05/20/13  POCT CBC      Result Value Range   WBC 5.9  4.6 - 10.2 K/uL   Lymph, poc 2.0  0.6 - 3.4   POC LYMPH PERCENT 33.8  10 - 50 %L   MID (cbc) 0.4  0 - 0.9   POC MID % 7.1  0 - 12 %M   POC Granulocyte 3.5  2 - 6.9   Granulocyte percent 59.1  37 - 80 %G   RBC 5.22  4.04 - 5.48 M/uL   Hemoglobin 15.5  12.2 - 16.2 g/dL   HCT, POC 14.7  82.9 - 47.9 %   MCV 89.7  80 - 97 fL   MCH, POC 29.7   27 - 31.2 pg   MCHC 33.1  31.8 - 35.4 g/dL   RDW, POC 56.2     Platelet Count, POC 191  142 - 424 K/uL   MPV 10.2  0 - 99.8 fL  GLUCOSE, POCT (MANUAL RESULT ENTRY)      Result Value Range   POC Glucose 113 (*) 70 - 99 mg/dl  POCT GLYCOSYLATED HEMOGLOBIN (HGB A1C)      Result Value Range   Hemoglobin A1C 6.6          Assessment & Plan:  Abigail [name] is a 62 y.o. female  Chest pain - Plan: EKG 12-Lead, Comprehensive metabolic panel, TSH, DG Chest 2 View, Lipid panel  Ear pain, bilateral - Plan: POCT CBC, amoxicillin (AMOXIL) 875 MG tablet  Dizziness and giddiness - Plan: POCT glucose (manual entry)  HTN (hypertension)  Nonspecific abnormal electrocardiogram (ECG) (EKG)  Hyperglycemia - Plan: POCT glycosylated hemoglobin (Hb A1C)  Numbness and tingling in left arm  Otitis media, bilateral - Plan: amoxicillin (AMOXIL) 875 MG tablet   Chest pain - longstanding, past 6 months, but concerning with hx of exertional component, underlying uncontrolled HTN., and hyperglycemia, DM2 liklely with A1c 6.6. Initially planned eval at cardiology office today, but on discussion of plan - states in addition to chest pain (6/10), now has tingling going into neck and down L arm. Also notes tingling in L leg. Placed on monitor, O2nc, aspirin 324mg  chewable at 1015,  called EMS for transport to ER.   Ear pain, ETD vs early AOM with effusion - start amoxicillin 875mg  BID. contineu topical cream per derm recommendations, and recheck if not improving in next 1 week.   Dizziness - asx currently - prior vertigo. RTC/911/er precautions given if returns.    1030 addendum: BP 148/88.  EMS on scene - transfer of care.  They may give NTG in route. Charge nurse advised at [facility].    Patient Instructions  Can follow up with me after you are evaluated in the hospital to discuss your elevated blood sugar and tingling in feet, and to recheck your ears.

## 2013-05-20 NOTE — H&P (Signed)
History and Physical   Patient ID: Abigail [name] MRN: [id number], DOB/AGE: May 05, 1951 62 y.o. Date of Encounter: 05/20/2013  Primary Physician: Shade Flood, MD Primary Cardiologist: Daleen Squibb  Chief Complaint:  Chest pain on exertion w/ associated SOB.  HPI: Ms. Abigail [name] is a 62 y.o. y/o female w/ PMHx of HTN, HLD, GERD, fatty liver, COPD, IBS, Psoriasis, and obesity, presents to the ED w/ complaints of chest pain and associated SOB. She says she has noticed this pain off and on for 6 months or so, more associated with exertion, however she has also had it at rest. She describes it more as sharp in nature in the left sternal area that is also associated with movement and deep inspiration. The episode she had today was about a 6/10 in severity with associated numbness and tingling in his left arm. She describes SOB that is more with exertion and also associated with her pain. She denies any nausea, vomiting, dizziness, lightheadedness, LOC, diaphoresis, fever, or chills. She also does not claim to have any prior history with clotting issue or a family history of PE, DVT, or clotting problems.   Past Medical History  Diagnosis Date  . Hemorrhoids   . Psoriasis   . Arthritis   . Female stress incontinence   . Fatty liver   . Hypertension   . Hyperlipidemia   . GERD (gastroesophageal reflux disease)   . IBS (irritable bowel syndrome)   . Hyperplastic colon polyp   . COPD (chronic obstructive pulmonary disease)   . Obesity     Surgical History:  Past Surgical History  Procedure Laterality Date  . Shoulder surgery      right  . Ventral hernia repair    . Nasal sinus surgery    . Tonsillectomy    . Mouth surgery    . Hernia repair      I have reviewed the patient's current medications. Prior to Admission medications   Medication Sig Start Date End Date Taking? Authorizing Provider  aspirin 81 MG tablet Take 81 mg by mouth daily.   Yes Historical Provider, MD  dicyclomine  (BENTYL) 20 MG tablet Take 20 mg by mouth every 6 (six) hours as needed.    Yes Historical Provider, MD  lisinopril-hydrochlorothiazide (PRINZIDE,ZESTORETIC) 10-12.5 MG per tablet Take 1 tablet by mouth daily.     Yes Historical Provider, MD  omeprazole (PRILOSEC) 40 MG capsule Take 1 capsule (40 mg total) by mouth 2 (two) times daily. 10/12/12  Yes Hart Carwin, MD   Scheduled Meds:  Continuous Infusions: . sodium chloride     PRN Meds:.nitroGLYCERIN  Allergies: No Known Allergies  History   Social History  . Marital Status: Married    Spouse Name: N/A    Number of Children: 2  . Years of Education: N/A   Occupational History  . retired    Social History Main Topics  . Smoking status: Never Smoker   . Smokeless tobacco: Never Used  . Alcohol Use: No  . Drug Use: No  . Sexually Active: Yes    Birth Control/ Protection: None   Other Topics Concern  . Not on file   Social History Narrative  . No narrative on file    Family History  Problem Relation Age of Onset  . Heart disease Maternal Grandfather   . Colon polyps Maternal Aunt   . Breast cancer Mother   . Cancer Mother    Family Status  Relation Status Death Age  .  Mother Deceased   . Daughter Alive   . Son Alive     Review of Systems:  General: Denies fever, chills, diaphoresis, appetite change and fatigue.  HEENT: Denies change in vision, eye pain, redness, hearing loss, congestion, sore throat, rhinorrhea, sneezing, mouth sores, trouble swallowing, neck pain, neck stiffness and tinnitus.   Respiratory: SOB more with exertion. Denies cough or wheezing.   Cardiovascular: Claims to have chest pain, mostly with exertion. Denies palpitations and leg swelling.  Gastrointestinal: Denies nausea, vomiting, abdominal pain, diarrhea, constipation, blood in stool and abdominal distention.  Endocrine: Denies hot or cold intolerance, sweats, polyuria, polydipsia. Musculoskeletal: Denies myalgias, back pain, joint  swelling, arthralgias and gait problem.  Skin: Complains of ongoing problems with psoriasis, that she says are mostly present on her back. No other complaints of rashes or bruising. Neurological: Describes some numbness and tingling in her left arm associated with her chest pain. Denies dizziness, seizures, syncope, weakness, lightheadedness, and headaches.  Hematological: Denies adenopathy,easy bruising, personal or family bleeding history.  Psychiatric/Behavioral: Denies mood changes, confusion, nervousness, sleep disturbance and agitation.  Physical Exam: Blood pressure 143/70, pulse 62, temperature 98.6 F (37 C), temperature source Oral, resp. rate 14, height 5\' 2"  (1.575 m), weight 214 lb (97.07 kg), SpO2 97.00%. General: Vital signs reviewed.  Patient is a well-developed and well-nourished, in no acute distress and cooperative with exam. Alert and oriented x3.  Head: Normocephalic and atraumatic. Nose: No erythema or drainage noted.  Turbinates normal. Mouth: No erythema, exudates, sores, or ulcerations. Moist mucus membranes. Eyes: PERRL, EOMI, conjunctivae normal, No scleral icterus.  Neck: Supple, trachea midline, normal ROM, No JVD, masses, thyromegaly, or carotid bruit present.  Cardiovascular: RRR, S1 normal, S2 norma. II/VI systolic murmur present. No gallops, or rubs. Pulmonary/Chest: Normal respiratory effort, CTAB, no wheezes. Mild crackles in the lower lung fields. Abdominal: Soft. Non-tender, non-distended, bowel sounds are normal, no masses, organomegaly, or guarding present.  Musculoskeletal: No joint deformities, erythema, or stiffness, ROM full and no nontender. Extremities: No swelling or edema,  pulses symmetric and intact bilaterally. No cyanosis or clubbing. Hematology: no cervical, inginal, or axillary adenopathy.  Neurological: A&O x3, Strength is normal and symmetric bilaterally, cranial nerve II-XII are grossly intact, no focal motor deficit, sensory intact to  light touch bilaterally.  Skin: Warm, dry and intact. Psoriatic rash resent on the back. Psychiatric: Normal mood and affect. speech and behavior is normal. Cognition and memory are normal.   Labs:   Lab Results  Component Value Date   WBC 5.5 05/20/2013   HGB 14.7 05/20/2013   HCT 40.1 05/20/2013   MCV 83.2 05/20/2013   PLT 158 05/20/2013   No results found for this basename: INR,  in the last 72 hours  Recent Labs Lab 05/20/13 1130  NA 140  K 3.6  CL 104  CO2 24  BUN 11  CREATININE 0.54  CALCIUM 9.1  GLUCOSE 124*    Recent Labs  05/20/13 1128  TROPONINI <0.30    Recent Labs  05/20/13 1136  TROPIPOC 0.00   No results found for this basename: CHOL, HDL, LDLCALC, TRIG   No results found for this basename: probnp   No results found for this basename: DDIMER    Radiology/Studies: Dg Chest 2 View  05/20/2013   *RADIOLOGY REPORT*  Clinical Data: Chest pain  CHEST - 2 VIEW  Comparison: 02/15/2008  Findings: Mild left lower lobe airspace disease may represent pneumonia or atelectasis.  Right lung is clear.  Negative  for heart failure or effusion.  IMPRESSION: Left lower lobe atelectasis or pneumonia.   Original Report Authenticated By: Janeece Riggers, M.D.   Dg Chest 2 View  05/20/2013   *RADIOLOGY REPORT*  Clinical Data: Chest pain and shortness of breath.  CHEST - 2 VIEW  Comparison: No priors.  Findings: Lung volumes are low.  There are some basilar opacities favored to reflect subsegmental atelectasis.  No definite acute consolidated airspace disease.  No pleural effusions.  No evidence of pulmonary edema.  Heart size is normal.  Mediastinal contours are unremarkable.  Atherosclerosis in the thoracic aorta.  IMPRESSION: 1.  Low lung volumes with bibasilar subsegmental atelectasis. 2.  Atherosclerosis.  Clinically significant discrepancy from primary report, if provided: None   Original Report Authenticated By: Trudie Reed, M.D.     Cardiac Cath: No prior  catheterization.  Echo: None  ECG: NSR @ 73 BPM, Q-waves in II and aVF.  ASSESSMENT AND PLAN:   62 y.o. y/o female w/ PMHx of HTN, HLD, GERD, fatty liver, COPD, IBS, Psoriasis, and obesity, presents to the ED w/ complaints of chest pain and associated SOB. Her chest pain is a very atypical presentation and is most likely not cardiac related and her troponins have been negative up to this point.. However, due to her presentation it is important to rule out cardiac causes. At this point, we reccommend a Myoview stress test and a 2D ECHO, to assess the function of her heart at this point, especially given the presence of a systolic murmur heard on physical exam.  Signed, Lars Masson, MD 05/20/2013 2:07 PM  Patient examined chart reviewed. Discussed care with resident and family. Atypical pain last 6 months. No acute ECG changes and POC enzymes negative. Has SEM on exam.  Patient can walk well.  Will have myovue and echo in hospital.  Weight 214 sufficiently low for one day protocol per nuclear tech.    Charlton Haws

## 2013-05-20 NOTE — Progress Notes (Signed)
Rec'd a call from RN re: elevated d-dimer. Will order CTA chest.  Lab Results  Component Value Date   DDIMER 1.51* 05/20/2013

## 2013-05-20 NOTE — ED Notes (Signed)
Reports left side CP intermittent x 6 months. Sometimes occurs while at rest, episodes sometimes lasting more than 6 hrs. Describes pain as achey & at times sharp & at times radiation into LUE & left side neck. Denies SOB, n/v, diaphoresis. Increased pain with deep breaths, palpation & mvmt. States CP 6/10 then decreased to 2/10 after NTG x1.  Reports noticed CP upon awakening this morning.

## 2013-05-20 NOTE — ED Provider Notes (Signed)
History    CSN: [id number] Arrival date & time 05/20/13  1058  First MD Initiated Contact with Patient 05/20/13 1106     Chief Complaint  Patient presents with  . Chest Pain   (Consider location/radiation/quality/duration/timing/severity/associated sxs/prior Treatment) Patient is a 62 y.o. female presenting with chest pain. The history is provided by the patient and the EMS personnel.  Chest Pain Associated symptoms comment:  Abigail [name] is a 62 y.o. female h/o HTN here with exertional chest pain for the past month associated with SOB.  She denies diaphoresis or emesis.  Today, patient also experienced some L hand numbness/tingling which brought her to an urgent care an subsequently this ED.  Pain lasts a couple of hours and resolves on it own, it does sometimes come on at rest.  She denies fevers, cough, leg swelling/pain, or history of clots.  ROS is otherwise negative.  She received ASA and NTG en route which dropped her pain from 6 to 2.  Past Medical History  Diagnosis Date  . Hemorrhoids   . Psoriasis   . Arthritis   . Female stress incontinence   . Fatty liver   . Hypertension   . Hyperlipidemia   . GERD (gastroesophageal reflux disease)   . IBS (irritable bowel syndrome)   . Hyperplastic colon polyp   . COPD (chronic obstructive pulmonary disease)   . Obesity    Past Surgical History  Procedure Laterality Date  . Shoulder surgery      right  . Ventral hernia repair    . Nasal sinus surgery    . Tonsillectomy    . Mouth surgery    . Hernia repair     Family History  Problem Relation Age of Onset  . Heart disease Maternal Grandfather   . Colon polyps Maternal Aunt   . Breast cancer Mother   . Cancer Mother    History  Substance Use Topics  . Smoking status: Never Smoker   . Smokeless tobacco: Never Used  . Alcohol Use: No   OB History   Grav Para Term Preterm Abortions TAB SAB Ect Mult Living                 Review of Systems  Cardiovascular:  Positive for chest pain.  10 Systems reviewed and are negative for acute change except as noted in the HPI.   Allergies  Review of patient's allergies indicates no known allergies.  Home Medications   Current Outpatient Rx  Name  Route  Sig  Dispense  Refill  . amoxicillin (AMOXIL) 875 MG tablet   Oral   Take 1 tablet (875 mg total) by mouth 2 (two) times daily.   20 tablet   0   . aspirin 81 MG tablet   Oral   Take 81 mg by mouth daily.         Marland Kitchen dicyclomine (BENTYL) 20 MG tablet   Oral   Take 20 mg by mouth every 6 (six) hours.         Marland Kitchen lisinopril-hydrochlorothiazide (PRINZIDE,ZESTORETIC) 10-12.5 MG per tablet   Oral   Take 1 tablet by mouth daily.           Marland Kitchen omeprazole (PRILOSEC) 40 MG capsule   Oral   Take 1 capsule (40 mg total) by mouth 2 (two) times daily.   60 capsule   3   . traMADol (ULTRAM) 50 MG tablet   Oral   Take 1 tablet (50 mg total)  by mouth every 8 (eight) hours as needed for pain.   20 tablet   0    BP 156/59  Temp(Src) 98.6 F (37 C) (Oral)  Resp 18  Ht 5\' 2"  (1.575 m)  Wt 214 lb (97.07 kg)  BMI 39.13 kg/m2  SpO2 96% Physical Exam  Nursing note and vitals reviewed. Constitutional: She is oriented to person, place, and time. She appears well-developed and well-nourished. No distress.  HENT:  Head: Normocephalic and atraumatic.  Nose: Nose normal.  Mouth/Throat: Oropharynx is clear and moist. No oropharyngeal exudate.  Eyes: Conjunctivae and EOM are normal. Pupils are equal, round, and reactive to light. No scleral icterus.  Neck: Normal range of motion. Neck supple. No JVD present. No tracheal deviation present. No thyromegaly present.  Cardiovascular: Normal rate and regular rhythm.  Exam reveals no gallop and no friction rub.   Murmur heard. Pulmonary/Chest: Effort normal and breath sounds normal. No respiratory distress. She has no wheezes. She exhibits no tenderness.  Abdominal: Soft. Bowel sounds are normal. She exhibits no  distension and no mass. There is no tenderness. There is no rebound and no guarding.  Musculoskeletal: Normal range of motion. She exhibits no edema and no tenderness.  Lymphadenopathy:    She has no cervical adenopathy.  Neurological: She is alert and oriented to person, place, and time. No cranial nerve deficit.  Skin: Skin is warm and dry. Rash noted. No erythema. No pallor.  psoriatic rash noted on back    ED Course  Procedures (including critical care time) Labs Reviewed  CBC WITH DIFFERENTIAL  BASIC METABOLIC PANEL  TROPONIN I   Dg Chest 2 View  05/20/2013   *RADIOLOGY REPORT*  Clinical Data: Chest pain and shortness of breath.  CHEST - 2 VIEW  Comparison: No priors.  Findings: Lung volumes are low.  There are some basilar opacities favored to reflect subsegmental atelectasis.  No definite acute consolidated airspace disease.  No pleural effusions.  No evidence of pulmonary edema.  Heart size is normal.  Mediastinal contours are unremarkable.  Atherosclerosis in the thoracic aorta.  IMPRESSION: 1.  Low lung volumes with bibasilar subsegmental atelectasis. 2.  Atherosclerosis.  Clinically significant discrepancy from primary report, if provided: None   Original Report Authenticated By: Trudie Reed, M.D.   No diagnosis found.  Date: 05/20/2013  Rate: 73  Rhythm: normal sinus rhythm  QRS Axis: left  Intervals: normal  ST/T Wave abnormalities: normal  Conduction Disutrbances:none  Narrative Interpretation: Q waves present III aVF  Old EKG Reviewed: unchanged   MDM  Patient received aspirin and NTG en route, which dropped her pain from 6 to 2.  Patient has story concern ing for ACS.  Rec inpatient admission for continued work up.  1250 cards PA will evaluate patient at bedside for admission  1445 cards has accepted the patient for admission  Tomasita Crumble, MD 05/20/13 1444

## 2013-05-20 NOTE — Progress Notes (Addendum)
Per primary care note, pt was recommended to start amoxicillin 875mg  BID for ?ETD vs early AOM. [facility] does not carry an 875mg  tablet thus will use chewable tablets as inpatient but will need RX for 875mg  tab at discharge. Dayna Dunn PA-C

## 2013-05-21 ENCOUNTER — Observation Stay (HOSPITAL_COMMUNITY): Payer: BC Managed Care – PPO

## 2013-05-21 ENCOUNTER — Encounter (HOSPITAL_COMMUNITY): Payer: Self-pay | Admitting: Physician Assistant

## 2013-05-21 DIAGNOSIS — R911 Solitary pulmonary nodule: Secondary | ICD-10-CM | POA: Diagnosis present

## 2013-05-21 DIAGNOSIS — R7989 Other specified abnormal findings of blood chemistry: Secondary | ICD-10-CM | POA: Diagnosis present

## 2013-05-21 DIAGNOSIS — R072 Precordial pain: Secondary | ICD-10-CM

## 2013-05-21 DIAGNOSIS — R079 Chest pain, unspecified: Secondary | ICD-10-CM

## 2013-05-21 LAB — BASIC METABOLIC PANEL
Calcium: 9.6 mg/dL (ref 8.4–10.5)
GFR calc Af Amer: >90 mL/min (ref >90)
GFR calc non Af Amer: >90 mL/min (ref >90)
Potassium: 3.7 mEq/L (ref 3.5–5.1)
Sodium: 138 mEq/L (ref 135–145)

## 2013-05-21 LAB — TROPONIN I: Troponin I: <0.3 ng/mL (ref <0.30)

## 2013-05-21 LAB — CBC
MCH: 30.2 pg (ref 26.0–34.0)
MCHC: 35.8 g/dL (ref 30.0–36.0)
Platelets: 171 10*3/uL (ref 150–400)

## 2013-05-21 LAB — LIPID PANEL: HDL: 33 mg/dL — ABNORMAL LOW (ref >39)

## 2013-05-21 MED ORDER — AMOXICILLIN 875 MG PO TABS: 875.0000 mg | ORAL_TABLET | Freq: Two times a day (BID) | ORAL | Status: DC

## 2013-05-21 MED ORDER — TECHNETIUM TC 99M SESTAMIBI GENERIC - CARDIOLITE
30.0000 | Freq: Once | INTRAVENOUS | Status: AC | PRN
  Administered 2013-05-21: 30 via INTRAVENOUS

## 2013-05-21 MED ORDER — TECHNETIUM TC 99M SESTAMIBI GENERIC - CARDIOLITE
10.0000 | Freq: Once | INTRAVENOUS | Status: AC | PRN
  Administered 2013-05-21: 10 via INTRAVENOUS

## 2013-05-21 NOTE — ED Provider Notes (Signed)
I saw and evaluated the patient, reviewed the resident's note and I agree with the findings and plan. 62 yo F with HTN and exertional chest pain for several months. Mild pressure in ED which improve with meds. Given risk factors and concern for CAD will admit to cardiology.   Loren Racer, MD 05/21/13 [phone]

## 2013-05-21 NOTE — Progress Notes (Signed)
Stress myoview performed. Pt informed of CT findings and A1C 6.6 as outpatient. Dayna Dunn PA-C

## 2013-05-21 NOTE — Progress Notes (Signed)
*  PRELIMINARY RESULTS* Echocardiogram 2D Echocardiogram has been performed.  Abigail [name] 05/21/2013, 2:43 PM

## 2013-05-21 NOTE — Progress Notes (Signed)
UR Completed Joban Colledge Graves-Bigelow, RN,BSN [phone]  

## 2013-05-21 NOTE — Discharge Summary (Signed)
Discharge Summary   Patient ID: Abigail [name] MRN: [id number], DOB/AGE: [birth year] 64 y.o. Admit date: 05/20/2013 D/C date:     05/21/2013  Primary Cardiologist: Wall  Primary Discharge Diagnoses:  1. Chest pain, noncardiac - stress test negative this admission - CT angio negative for PE 2. HTN 3. 4mm left lung nodule by CT this admission 4. Ear pain ? ETD vs AOM 5. Hypertriglyceridemia  Secondary Discharge Diagnoses:  1. Hemorrhoids 2. Psoriasis 3. Arthritis 4. Female stress incontinence 5. Fatty liver 6. Hyperlipidemia 7. GERD 8. IBS 9. Hyperplastic colon polyp 10. COPD 11. Obesity 12. Cancer 13. Skin cancer  Hospital Course: Abigail [name] is a 62 y/o F with history of HTN, HLD, GERD, fatty liver, COPD, IBS, Psoriasis, and obesity who presented to the ER from urgent care with complaints of chest pain and associated SOB. She has noticed the pain off and on for 6 months, more associated with exertion, but has also had it at rest. She described it as sharp, left-sided, associated with movement and deep inspiration. On the day of admission she had an episode that was 6/0 in severity with associated SOB & left arm numbness/tingling. She denied any nausea, vomiting, dizziness, lightheadedness, LOC, diaphoresis, fever, or chills, prior history with clotting issue or a family history of PE, or DVT. EKG showed NSR @ 73 BPM, Q-waves in II and aVF. Initial troponin was negative and she was admitted to the hospital for further rule out. CXR suggested left lower lobe atelectasis or pneumonia - however, she was afebrile, WBC was normal, and did not have a cough. Due to pleuritic nature of chest pain, d-dimer was obtained which was elevated at 1.51. CT angio was performed which was negative for PE, but did show a 4mm nonspecific left lung nodule. Per radiology report, if the patient is at high risk for bronchogenic carcinoma, follow-up chest CT at 1 year is recommended. If the patient is at low risk,  no follow-up is needed. The patient was informed of this finding and instructed to discuss further evaluation with her PCP. Troponins remained completely negative. She underwent exercise nuclear stress testing and reached target HR. She had mild SOB but no chest discomfort. She did become hypertensive with the study thus tracer was injected at stage 3, but had not yet received her morning Lisinopril/HCTZ. The stress test was negative for any evidence of prior infarction or exercise-induced ischemia. EF was 69%. Due to presence of systolic murmur on exam, 2D echo was obtained showing aortic valve sclerosis without stenosis, and calcified mitral annulus. The images were quite limited but suggested that LV function was OK (as confirmed by nuclear stress testing). Today she is feeling well. Dr. Myrtis Ser has seen & examined her today and feels she is stable for discharge.   Of note, during her urgent care visit the day of admission it was recommended she be treated with amoxicillin 875mg  BID for question of acute otitis media. She was given a prescription to complete a 10 day course. Hemoglobin A1C was also drawn as an outpatient, elevated at 6.6 indicating new diagnosis of diabetes. Triglyceride level was also quite high. The patient was instructed to follow-up with PCP for these issues. She was also instructed to monitor blood pressure at home (given the evidence of hypertension during exercise) and alert her doctor if she is finding higher readings.  Discharge Vitals: Blood pressure 115/60, pulse 87, temperature 98.2 F (36.8 C), temperature source Oral, resp. rate 17, height 5\' 2"  (  1.575 m), weight 213 lb 1.6 oz (96.662 kg), SpO2 96.00%.  Labs: Lab Results  Component Value Date   WBC 5.3 05/21/2013   HGB 14.9 05/21/2013   HCT 41.6 05/21/2013   MCV 84.4 05/21/2013   PLT 171 05/21/2013    Recent Labs Lab 05/20/13 0926  05/21/13 0520  NA 139  < > 138  K 4.2  < > 3.7  CL 105  < > 101  CO2 25  < > 27  BUN  12  < > 13  CREATININE 0.63  < > 0.60  CALCIUM 9.3  < > 9.6  PROT 6.9  --   --   BILITOT 0.8  --   --   ALKPHOS 101  --   --   ALT 28  --   --   AST 30  --   --   GLUCOSE 136*  < > 175*  < > = values in this interval not displayed.  Recent Labs  05/20/13 1128 05/20/13 1659 05/20/13 2252 05/21/13 0520  TROPONINI <0.30 <0.30 <0.30 <0.30   Lab Results  Component Value Date   CHOL 271* 05/21/2013   HDL 33* 05/21/2013   LDLCALC UNABLE TO CALCULATE IF TRIGLYCERIDE OVER 400 mg/dL 0/45/4098   TRIG 119* 1/47/8295   Lab Results  Component Value Date   DDIMER 1.51* 05/20/2013    Diagnostic Studies/Procedures   Dg Chest 2 View 05/20/2013   *RADIOLOGY REPORT*  Clinical Data: Chest pain  CHEST - 2 VIEW  Comparison: 02/15/2008  Findings: Mild left lower lobe airspace disease may represent pneumonia or atelectasis.  Right lung is clear.  Negative for heart failure or effusion.  IMPRESSION: Left lower lobe atelectasis or pneumonia.   Original Report Authenticated By: Janeece Riggers, M.D.   Ct Angio Chest Pe W/cm &/or Wo Cm 05/20/2013   *RADIOLOGY REPORT*  Clinical Data: Shortness of breath and chest pain  CT ANGIOGRAPHY CHEST  Technique:  Multidetector CT imaging of the chest using the standard protocol during bolus administration of intravenous contrast. Multiplanar reconstructed images including MIPs were obtained and reviewed to evaluate the vascular anatomy.  Contrast: OMNIPAQUE IOHEXOL 350 MG/ML SOLN  Comparison: Chest radiograph 05/20/2013  Findings: The study has some technical limitations due to motion artifact but there is no evidence for pulmonary embolism.  Images of the upper abdomen are unremarkable.  No significant pericardial or pleural fluid.  No significant chest lymphadenopathy.  The trachea and mainstem bronchi are patent.  There is a 4 mm nodule in the left upper lobe on sequence #6, image 35.  There is mild dependent lung atelectasis.  No large areas of consolidation or  airspace disease.  No acute bony abnormality.  IMPRESSION: No acute chest abnormality.  No evidence for pulmonary embolism.  4 mm nodule in the left lung is nonspecific.  If the patient is at high risk for bronchogenic carcinoma, follow-up chest CT at 1 year is recommended.  If the patient is at low risk, no follow-up is needed.  This recommendation follows the consensus statement: Guidelines for Management of Small Pulmonary Nodules Detected on CT Scans:  A Statement from the Fleischner Society as published in Radiology 2005; 237:395-400.     Original Report Authenticated By: Richarda Overlie, M.D.   Nm Myocar Multi W/spect W/wall Motion / Ef 05/21/2013   *RADIOLOGY REPORT*  Clinical Data:  Chest pain, hypertension, hyperlipidemia, history of COPD and obesity  MYOCARDIAL IMAGING WITH SPECT (REST AND EXERCISE) GATED LEFT VENTRICULAR WALL  MOTION STUDY LEFT VENTRICULAR EJECTION FRACTION  Technique:  Standard myocardial SPECT imaging was performed after resting intravenous injection of  10 mCi Tc-44m sestamibi. Subsequently, exercise tolerance test was performed by the patient under the supervision of the Cardiology staff.  At peak-stress, 30 mCi Tc-35m sestamibiwas injected intravenously and standard myocardial SPECT imaging was performed.   Of note, the patient was able to achieve target heart rate (achieved heart rate - 136 beats per minute; target heart rate - 134 beats per minute) and was able to exercise without recurrence of chest pain.  Quantitative gated imaging was also performed to evaluate left ventricular wall motion, and estimate left ventricular ejection fraction.  Comparison:  Chest CT - 05/20/2013  Findings:  Review of the rotational raw images demonstrates mild to moderate breast attenuation.  There is very minimal patient motion on both the rest and stress images.  Mild grossly symmetric GI activity is identified on both the provided rest and stress images.  SPECT imaging demonstrates relative homogeneous  distribution of injector radiotracer throughout the left ventricular myocardium. There are no discrete areas of prior infarction or mismatched perfusion to suggest ischemia.  Quantitative gated analysis shows normal wall motion.  The resting left ventricular ejection fraction is 69% with end- diastolic volume of 63 ml and end-systolic volume of 20 ml.  IMPRESSION: 1.  No scintigraphic evidence of prior infarction or exercise- induced ischemia.  Note, the patient was able to achieve target heart rate. 2.  Normal wall motion.  Ejection fraction - 69%.   Original Report Authenticated By: Tacey Ruiz, MD    Discharge Medications     Medication List         amoxicillin 875 MG tablet  Commonly known as:  AMOXIL  Take 1 tablet (875 mg total) by mouth 2 (two) times daily.     aspirin 81 MG tablet  Take 81 mg by mouth daily.     dicyclomine 20 MG tablet  Commonly known as:  BENTYL  Take 20 mg by mouth every 6 (six) hours as needed.     lisinopril-hydrochlorothiazide 10-12.5 MG per tablet  Commonly known as:  PRINZIDE,ZESTORETIC  Take 1 tablet by mouth daily.     omeprazole 40 MG capsule  Commonly known as:  PRILOSEC  Take 1 capsule (40 mg total) by mouth 2 (two) times daily.        Disposition   The patient will be discharged in stable condition to home. Discharge Orders   Future Orders Complete By Expires     Diet - low sodium heart healthy  As directed     Discharge instructions  As directed     Comments:      Please monitor your blood pressure at home and call your doctor if it tends to run higher than 130 on the top number or 80 on the bottom number.    Increase activity slowly  As directed       Follow-up Information   Follow up with GREENE,Qianna Clagett R, MD. (Your chest CT showed a small 4mm lung nodule. This may or may not require follow-up imaging in 1 year. Your A1C was also 6.6 indicating probable diabetes. Your triglycerides are also high. Please call primary doctor to  discuss in a followup appointment.)    Contact information:   [address] [place] [address] [phone]       Follow up with Valera Castle, MD. (The office will call you for a follow-up appointment)  Contact information:   [address] [place] [address] [phone]         Duration of Discharge Encounter: Greater than 30 minutes including physician and PA time.  Signed, Ronie Spies PA-C 05/21/2013, 8:10 PM Patient seen and examined. I agree with the assessment and plan as detailed above. See also my additional thoughts below.   See my progress note from today. I reviewed all data with Dayna Dunn PA-C. Pt.s ready for discahrge.  Willa Rough, MD, [facility] 05/24/2013 7:06 AM

## 2013-05-21 NOTE — Progress Notes (Addendum)
Patient ID: Abigail [name], female   DOB: 02-10-1951, 62 y.o.   MRN: [id number]   SUBJECTIVE: Troponins are negative. D-dimer was elevated. CT scan showed no pulmonary embolus. There was a very small pulmonary nodule. If there is increased risk she will need a followup CT scan in one year. The plan is for 2-D echo and nuclear study in the hospital.  Patient says she's feeling better. She says she has had very significant urinary output. At this point I cannot find any recording of this in the computer.   Filed Vitals:   05/20/13 1600 05/20/13 1627 05/20/13 2106 05/21/13 0509  BP: 134/82 150/70 150/67 124/57  Pulse: 76 71 81 75  Temp:  97.9 F (36.6 C) 97.5 F (36.4 C) 98.3 F (36.8 C)  TempSrc:  Oral Axillary Oral  Resp: 20 20    Height:  5\' 2"  (1.575 m)    Weight:  213 lb 1.6 oz (96.662 kg)  213 lb 1.6 oz (96.662 kg)  SpO2: 97% 94% 95% 95%   No intake or output data in the 24 hours ending 05/21/13 0936  LABS: Basic Metabolic Panel:  Recent Labs  14/78/29 1130 05/21/13 0520  NA 140 138  K 3.6 3.7  CL 104 101  CO2 24 27  GLUCOSE 124* 175*  BUN 11 13  CREATININE 0.54 0.60  CALCIUM 9.1 9.6   Liver Function Tests:  Recent Labs  05/20/13 0926  AST 30  ALT 28  ALKPHOS 101  BILITOT 0.8  PROT 6.9  ALBUMIN 4.3   No results found for this basename: LIPASE, AMYLASE,  in the last 72 hours CBC:  Recent Labs  05/20/13 1130 05/21/13 0520  WBC 5.5 5.3  NEUTROABS 2.9  --   HGB 14.7 14.9  HCT 40.1 41.6  MCV 83.2 84.4  PLT 158 171   Cardiac Enzymes:  Recent Labs  05/20/13 1659 05/20/13 2252 05/21/13 0520  TROPONINI <0.30 <0.30 <0.30   BNP: No components found with this basename: POCBNP,  D-Dimer:  Recent Labs  05/20/13 1659  DDIMER 1.51*   Hemoglobin A1C:  Recent Labs  05/20/13 0957  HGBA1C 6.6   Fasting Lipid Panel:  Recent Labs  05/21/13 0520  CHOL 271*  HDL 33*  LDLCALC UNABLE TO CALCULATE IF TRIGLYCERIDE OVER 400 mg/dL  TRIG 562*   CHOLHDL 8.2   Thyroid Function Tests:  Recent Labs  05/20/13 0926  TSH 1.088    RADIOLOGY: Dg Chest 2 View  05/20/2013   *RADIOLOGY REPORT*  Clinical Data: Chest pain  CHEST - 2 VIEW  Comparison: 02/15/2008  Findings: Mild left lower lobe airspace disease may represent pneumonia or atelectasis.  Right lung is clear.  Negative for heart failure or effusion.  IMPRESSION: Left lower lobe atelectasis or pneumonia.   Original Report Authenticated By: Janeece Riggers, M.D.   Dg Chest 2 View  05/20/2013   *RADIOLOGY REPORT*  Clinical Data: Chest pain and shortness of breath.  CHEST - 2 VIEW  Comparison: No priors.  Findings: Lung volumes are low.  There are some basilar opacities favored to reflect subsegmental atelectasis.  No definite acute consolidated airspace disease.  No pleural effusions.  No evidence of pulmonary edema.  Heart size is normal.  Mediastinal contours are unremarkable.  Atherosclerosis in the thoracic aorta.  IMPRESSION: 1.  Low lung volumes with bibasilar subsegmental atelectasis. 2.  Atherosclerosis.  Clinically significant discrepancy from primary report, if provided: None   Original Report Authenticated By: Trudie Reed, M.D.  Ct Angio Chest Pe W/cm &/or Wo Cm  05/20/2013   *RADIOLOGY REPORT*  Clinical Data: Shortness of breath and chest pain  CT ANGIOGRAPHY CHEST  Technique:  Multidetector CT imaging of the chest using the standard protocol during bolus administration of intravenous contrast. Multiplanar reconstructed images including MIPs were obtained and reviewed to evaluate the vascular anatomy.  Contrast: OMNIPAQUE IOHEXOL 350 MG/ML SOLN  Comparison: Chest radiograph 05/20/2013  Findings: The study has some technical limitations due to motion artifact but there is no evidence for pulmonary embolism.  Images of the upper abdomen are unremarkable.  No significant pericardial or pleural fluid.  No significant chest lymphadenopathy.  The trachea and mainstem bronchi are  patent.  There is a 4 mm nodule in the left upper lobe on sequence #6, image 35.  There is mild dependent lung atelectasis.  No large areas of consolidation or airspace disease.  No acute bony abnormality.  IMPRESSION: No acute chest abnormality.  No evidence for pulmonary embolism.  4 mm nodule in the left lung is nonspecific.  If the patient is at high risk for bronchogenic carcinoma, follow-up chest CT at 1 year is recommended.  If the patient is at low risk, no follow-up is needed.  This recommendation follows the consensus statement: Guidelines for Management of Small Pulmonary Nodules Detected on CT Scans:  A Statement from the Fleischner Society as published in Radiology 2005; 237:395-400.     Original Report Authenticated By: Richarda Overlie, M.D.    PHYSICAL EXAM patient is oriented to person time and place. Affect is normal. There is no jugulovenous distention. Lungs reveal scattered rhonchi. I believe there are rales at the bases. The abdomen is soft. There is no significant peripheral edema. Cardiac exam reveals S1 and S2.   TELEMETRY: I have reviewed telemetry today May 21, 2013. There is sinus rhythm. There has been some sinus tachycardia.   ASSESSMENT AND PLAN:    Chest pain   Workup is underway with plans for 2-D echo and nuclear stress today.  I wonder if some of her symptoms could possibly be from volume overload. We will await the results of her echo and nuclear study for more information.      Pulmonary nodule     If she is at increased risk she will need a followup scan in one year.    Elevated d-dimer    CT scan has shown no pulmonary embolus. No further workup.   Willa Rough 05/21/2013 9:36 AM

## 2013-05-28 ENCOUNTER — Ambulatory Visit (INDEPENDENT_AMBULATORY_CARE_PROVIDER_SITE_OTHER): Payer: BC Managed Care – PPO | Admitting: Family Medicine

## 2013-05-28 VITALS — BP 122/74 | HR 78 | Temp 97.8°F | Resp 18 | Ht 63.0 in | Wt 213.0 lb

## 2013-05-28 DIAGNOSIS — R739 Hyperglycemia, unspecified: Secondary | ICD-10-CM

## 2013-05-28 DIAGNOSIS — R079 Chest pain, unspecified: Secondary | ICD-10-CM

## 2013-05-28 DIAGNOSIS — H9209 Otalgia, unspecified ear: Secondary | ICD-10-CM

## 2013-05-28 DIAGNOSIS — R911 Solitary pulmonary nodule: Secondary | ICD-10-CM

## 2013-05-28 DIAGNOSIS — E781 Pure hyperglyceridemia: Secondary | ICD-10-CM

## 2013-05-28 DIAGNOSIS — R7309 Other abnormal glucose: Secondary | ICD-10-CM

## 2013-05-28 DIAGNOSIS — H9203 Otalgia, bilateral: Secondary | ICD-10-CM

## 2013-05-28 MED ORDER — FREESTYLE SYSTEM KIT: 1.0000 | PACK | Status: AC | PRN

## 2013-05-28 NOTE — Progress Notes (Signed)
Subjective:    Patient ID: Abigail Abigail [name], Abigail [name]    DOB: [birth year], 62 y.o.   MRN: [id number]  HPI Abigail Abigail [name] is a 62 y.o. Abigail [name] See last ov- 05/20/13 - sent to ER for chest pain - longstanding, past 6 months, but concerning at that time with hx of exertional component, underlying uncontrolled HTN., and hyperglycemia. Admitted for chest pain, but troponins negative. d-dimer was obtained which was elevated at 1.51. CT angio was performed which was negative for PE, but did show a 4mm nonspecific left lung nodule. Per radiology report, if the patient is at high risk for bronchogenic carcinoma, follow-up chest CT at 1 year is recommended. If the patient is at low risk, no follow-up is needed. She underwent exercise nuclear stress testing. The stress test was negative for any evidence of prior infarction or exercise-induced ischemia. EF was 69%. Due to presence of systolic murmur on exam, 2D echo was obtained showing aortic valve sclerosis without stenosis, and calcified mitral annulus. The images were quite limited but suggested that LV function was OK (as confirmed by nuclear stress testing). Still some soreness in chest. Has MMG scheduled this Monday.  Also noted to have hypertriglyceridemia - 947 on 05/21/13 in hospital. Prior off cholesterol meds for 6-8 months.  Restarted  Zocor 20mg  qd after hospital discharge. Some aches at times, not as bad as Lipitor. Notes in shoulders and back at times. walking around farm, up and down stairs at times.  Elevated glucose, with Hgb A1c 6.6 at last ov. Glucose 113 at that time.  Had been drinking sodas.   Lung nodule - nonsmoker, but has had 2nd hand smoke over the years. planning on repeat CT in 1 year.   Ear pain, ETD vs early AOM with effusion at last ov - rx  amoxicillin 875mg  BID - still taking this.  Seems better.  Still some sinus drainage. No fever. Still feels some fluid. No current allergy meds. Seen by ENT in past for this.     Review of Systems    Constitutional: Negative for fever and chills.  HENT: Positive for ear pain (fullness. ). Negative for ear discharge.   Respiratory: Negative for shortness of breath and wheezing.   Cardiovascular: Positive for chest pain (intremittent. no worsening. ).  Gastrointestinal: Negative for abdominal pain.  Genitourinary: Negative for difficulty urinating.  Musculoskeletal: Positive for myalgias.       Objective:   Physical Exam  Vitals reviewed. Constitutional: She is oriented to person, place, and time. She appears well-developed and well-nourished.  HENT:  Head: Normocephalic and atraumatic.  Right Ear: Hearing normal. A middle ear effusion is present.  Left Ear: Hearing normal. A middle ear effusion (clear - white effusion bilaterally, no tm erythema, canal clear. ) is present.  Eyes: Conjunctivae and EOM are normal. Pupils are equal, round, and reactive to light.  Neck: Carotid bruit is not present.  Cardiovascular: Normal rate, regular rhythm, normal heart sounds and intact distal pulses.   Pulmonary/Chest: Effort normal and breath sounds normal.    Abdominal: Soft. She exhibits no pulsatile midline mass. There is no tenderness.  Neurological: She is alert and oriented to person, place, and time.  Skin: Skin is warm and dry.  Psychiatric: She has a normal mood and affect. Her behavior is normal.        Assessment & Plan:  Abigail Abigail [name] is a 62 y.o. Abigail [name] Hyperglycemia - Plan: glucose monitoring kit (FREESTYLE) monitoring kit - rx given, prior  A1C 6.6. Early DM2/prediabetes. Plan on diet control for now, work on weight loss keep record of home cbg's and recheck to retest fasting glucose/  Chest pain - stale. Improved. S/p stress test, chest CT - reassuring overall, unlikely cardiac source.  Suspect chest wall pain, but has MMG pending next week as well.  Sx care with tylenol as needed, rtc precautions.   Otalgia of both ears - still on amoxicillin. If not clearing in next  week, may need ent eval.  Lung nodule seen on imaging study - likely incidental finding, nonsmoker, but possible secondary smoke exposure prior. Discussed repeat imagine in 1 year as possible recommendation.   Hypertriglyceridemia - had been off statin.  Restarted in hospital, plan on repeat fasting labs in 4-6 weeks.   Meds ordered this encounter  . glucose monitoring kit (FREESTYLE) monitoring kit    Sig: 1 each by Does not apply route as needed for other. Dispense one glucometer of choice, testing strips/lancets for once per day glucose checks.  QS for 1 month, 11 refills.    Dispense:  1 each    Refill:  0    Patient Instructions  Check your blood sugar once per day - fasting or 2 hours after meals. claritin over the counter once per day for ears - we can recheck this in 1 week. Tylenol if needed for chest wall pain. Return to the clinic or go to the nearest emergency room if any of your symptoms worsen or new symptoms occur. Cut back on soda use.

## 2013-05-28 NOTE — Patient Instructions (Signed)
Check your blood sugar once per day - fasting or 2 hours after meals. claritin over the counter once per day for ears - we can recheck this in 1 week. Tylenol if needed for chest wall pain. Return to the clinic or go to the nearest emergency room if any of your symptoms worsen or new symptoms occur. Cut back on soda use.

## 2013-05-31 ENCOUNTER — Ambulatory Visit: Payer: Self-pay | Admitting: Family Medicine

## 2013-05-31 LAB — HM MAMMOGRAPHY

## 2013-06-03 ENCOUNTER — Telehealth: Payer: Self-pay

## 2013-06-03 NOTE — Telephone Encounter (Signed)
Pt would like to know when she should check her blood sugar. Best# [phone]

## 2013-06-05 NOTE — Telephone Encounter (Signed)
I printed this information on her AVS, but here it is again if she needs it: Check your blood sugar once per day - fasting or 2 hours after meals. She can just pick one of these times every few days to minimize the frequency of testing.  4-5 readings per week is fine, but at various times above.

## 2013-06-05 NOTE — Telephone Encounter (Signed)
Patient notified and voiced understanding.

## 2013-06-15 ENCOUNTER — Encounter: Payer: Self-pay | Admitting: Radiology

## 2013-07-03 ENCOUNTER — Other Ambulatory Visit: Payer: Self-pay | Admitting: Internal Medicine

## 2013-07-09 ENCOUNTER — Ambulatory Visit: Payer: BC Managed Care – PPO

## 2013-07-09 ENCOUNTER — Ambulatory Visit (INDEPENDENT_AMBULATORY_CARE_PROVIDER_SITE_OTHER): Payer: BC Managed Care – PPO | Admitting: Family Medicine

## 2013-07-09 VITALS — BP 142/78 | HR 82 | Temp 98.2°F | Resp 16 | Ht 62.7 in | Wt 213.8 lb

## 2013-07-09 DIAGNOSIS — M542 Cervicalgia: Secondary | ICD-10-CM

## 2013-07-09 DIAGNOSIS — M791 Myalgia, unspecified site: Secondary | ICD-10-CM

## 2013-07-09 DIAGNOSIS — IMO0001 Reserved for inherently not codable concepts without codable children: Secondary | ICD-10-CM

## 2013-07-09 DIAGNOSIS — R739 Hyperglycemia, unspecified: Secondary | ICD-10-CM

## 2013-07-09 DIAGNOSIS — E785 Hyperlipidemia, unspecified: Secondary | ICD-10-CM

## 2013-07-09 DIAGNOSIS — R7309 Other abnormal glucose: Secondary | ICD-10-CM

## 2013-07-09 LAB — COMPREHENSIVE METABOLIC PANEL
ALT: 30 U/L (ref 0–35)
AST: 30 U/L (ref 0–37)
Albumin: 4.2 g/dL (ref 3.5–5.2)
Alkaline Phosphatase: 87 U/L (ref 39–117)
Potassium: 4 mEq/L (ref 3.5–5.3)
Sodium: 141 mEq/L (ref 135–145)
Total Protein: 6.6 g/dL (ref 6.0–8.3)

## 2013-07-09 LAB — GLUCOSE, POCT (MANUAL RESULT ENTRY): POC Glucose: 149 mg/dl — AB (ref 70–99)

## 2013-07-09 LAB — POCT GLYCOSYLATED HEMOGLOBIN (HGB A1C): Hemoglobin A1C: 6.5

## 2013-07-09 LAB — LIPID PANEL: LDL Cholesterol: 99 mg/dL (ref 0–99)

## 2013-07-09 LAB — CK: Total CK: 91 U/L (ref 7–177)

## 2013-07-09 MED ORDER — METFORMIN HCL 500 MG PO TABS: 500.0000 mg | ORAL_TABLET | Freq: Every day | ORAL | Status: DC

## 2013-07-09 NOTE — Progress Notes (Signed)
Subjective:    Patient ID: Abigail [name], female    DOB: 03/20/1951, 62 y.o.   MRN: [id number]  HPI Abigail [name] is a 62 y.o. female  Hypertriglyceridemia - 947 on 05/21/13 in hospital. Prior off cholesterol meds for 6-8 months.  restarted Zocor in July.  Fasting since last night. Taking simvastatin 20mg  qd.  Feels fatigued with this. Has had some neck and shoulder soreness. Taking tylenol every day.  Has taken crestor in past - some soreness in muscles, but less than lipitor. Not sure if better than current med.  No new abd pain.   Hyperglycemia/prediabetes. Hgb A1c 6.6 at 05/20/13 ov. Home meter: fasting: 133, 145, 153, 163.  Highest: 191.  2 hour PP about the same. Lowest 104.  Trying to change diet, less sodas, more water. Weight 213 last ov and today.  Reading book on diabetes.  Doing more walking for exercise.   Lung nodule -4mm nonspecific left lung nodule on 7/14 CT.see prior ov - nonsmoker, but has had 2nd hand smoke over the years. planning on repeat CT in 1 year.   Neck pain - longstanding. For years.  Noted before chol med.  Pain in shoulders, no arm weakness. Takes tylenol about once per day.    Review of Systems  Constitutional: Negative for fever and chills.  Respiratory: Negative for cough and shortness of breath.   Cardiovascular: Negative for chest pain.  Gastrointestinal: Positive for abdominal pain (intermittent, no recent changes. ).       Lower abdomen/bladder area  Genitourinary: Negative for difficulty urinating.  Musculoskeletal: Positive for arthralgias (neck to shoulders as above. ). Negative for back pain.  Skin: Negative for rash.       Objective:   Physical Exam  Vitals reviewed. Constitutional: She is oriented to person, place, and time. She appears well-developed and well-nourished.  HENT:  Head: Normocephalic and atraumatic.  Mouth/Throat: Oropharynx is clear and moist.  Eyes: Conjunctivae and EOM are normal. Pupils are equal, round, and reactive to  light.  Neck: Normal range of motion. No thyromegaly present.  Cardiovascular: Normal rate, regular rhythm, normal heart sounds and intact distal pulses.   No murmur heard. Pulmonary/Chest: Effort normal. She has no wheezes. She has no rales.  Abdominal: Soft. Normal appearance. She exhibits no abdominal bruit and no pulsatile midline mass. There is no tenderness.  Musculoskeletal:       Cervical back: She exhibits decreased range of motion, tenderness (mid c spine, slight spasm paraspinals to trapezius. ) and spasm.  Neurological: She is alert and oriented to person, place, and time.  Microfilament testing WNL bilat feet  Skin: Skin is warm and dry.  Psychiatric: She has a normal mood and affect. Her behavior is normal.   [facility] reading (PRIMARY) by  Dr. Neva Seat: C spine: degenerative changes upper greater than lower.   Results for orders placed in visit on 07/09/13  GLUCOSE, POCT (MANUAL RESULT ENTRY)      Result Value Range   POC Glucose 149 (*) 70 - 99 mg/dl  POCT GLYCOSYLATED HEMOGLOBIN (HGB A1C)      Result Value Range   Hemoglobin A1C 6.5        Assessment & Plan:  Abigail [name] is a 62 y.o. female Hyperglycemia - Plan: POCT glucose (manual entry), POCT glycosylated hemoglobin (Hb A1C) - prediabetes. improved diet, exercise - discussed continuing these changes, but may benefit from metformin 500mg  qd.   Hyperlipidemia - Plan: Comprehensive metabolic panel, Lipid panel pending.  Continue simvastatin 20mg  qd. CK level pending, but doubt neck pain from statin.  Consider Crestor if levels elevated, but may have had SE's of this in past. If stable, will need refill of simvastatin.   Neck pain - Plan: DG Cervical Spine Complete.  Degenerative disease. Trial of heat, gentle stretches/ROM, tylenol up to 4 times per day. If not improving in next 2 weeks, consider PT or ortho eval.   Meds ordered this encounter  Medications  . glucose blood (FREESTYLE LITE) test strip    Sig: 1 each by  Other route daily. Use as instructed  . DISCONTD: metFORMIN (GLUCOPHAGE) 500 MG tablet    Sig: Take 1 tablet (500 mg total) by mouth daily with breakfast.    Dispense:  180 tablet    Refill:  3  . metFORMIN (GLUCOPHAGE) 500 MG tablet    Sig: Take 1 tablet (500 mg total) by mouth daily with breakfast.    Dispense:  90 tablet    Refill:  1   Patient Instructions  You should receive a call or letter about your lab results within the next week to 10 days.  Start metformin once per day for blood sugar.  Keep working on diet/exercise.  Try tylenol over the counter up to 4 times per day, heat,, gentle range of motion and see the back care manual for other information. If not improving in next few weeks, can call to discuss physical therapy or orthopaedic doctor.

## 2013-07-09 NOTE — Patient Instructions (Signed)
You should receive a call or letter about your lab results within the next week to 10 days.  Start metformin once per day for blood sugar.  Keep working on diet/exercise.  Try tylenol over the counter up to 4 times per day, heat,, gentle range of motion and see the back care manual for other information. If not improving in next few weeks, can call to discuss physical therapy or orthopaedic doctor.

## 2013-07-22 ENCOUNTER — Telehealth: Payer: Self-pay

## 2013-07-22 NOTE — Telephone Encounter (Signed)
Patient wanted to ask Dr. Neva Seat about some stomach pain she has been experiencing with the Metformin she has been given. Please advise.   Best: [phone]

## 2013-07-23 NOTE — Telephone Encounter (Signed)
The gi discomfort should resolve. She should take metformin with a full meal. Have called patient to advise. No answer.

## 2013-07-24 NOTE — Telephone Encounter (Signed)
Pt states that she has been taking the Metformin for 1 and 1/2 weeks and she has been having alternating constipation and diarrhea.  She takes it after she eats.  Her fasting sugars have been running between 130s-140s.

## 2013-07-26 ENCOUNTER — Telehealth: Payer: Self-pay

## 2013-07-26 NOTE — Telephone Encounter (Signed)
Called patient to advise. Left message for her to call me back  

## 2013-07-26 NOTE — Telephone Encounter (Signed)
PT STATES AMY HAD CALLED HER EARLIER THIS MORNING AND SHE IS RETURNING THE CALL. PLEASE CALL [phone]

## 2013-07-26 NOTE — Telephone Encounter (Signed)
Abigail [name]   Patient RT your call   [phone]

## 2013-07-26 NOTE — Telephone Encounter (Signed)
It is not uncommon to have some GI intolerance to the metformin.can try to decrease to 1/2 pill per day or change timing of dose.   If still intolerant to this med, can discontinue for now and really work on diet, with recheck in next month or two with record of home blood sugar readings.

## 2013-07-27 NOTE — Telephone Encounter (Signed)
Patient advised to decrease the metformin to 1/2 a pill with her largest meal. When her body adjusts to this she can increase to 1 pill with her largest meal. She is advised to call me back if she can not tolerate the 1/2 pill dose. She agrees.

## 2013-10-06 ENCOUNTER — Ambulatory Visit: Payer: BC Managed Care – PPO | Admitting: Podiatry

## 2013-12-06 DIAGNOSIS — K2940 Chronic atrophic gastritis without bleeding: Secondary | ICD-10-CM | POA: Insufficient documentation

## 2013-12-09 ENCOUNTER — Encounter: Payer: Self-pay | Admitting: Internal Medicine

## 2013-12-09 NOTE — Progress Notes (Signed)
Patient's omeprazole twice daily has been approved untl 11/2014. Approval # is [id number].

## 2014-03-15 ENCOUNTER — Other Ambulatory Visit: Payer: Self-pay | Admitting: Internal Medicine

## 2014-04-21 ENCOUNTER — Other Ambulatory Visit: Payer: Self-pay | Admitting: Internal Medicine

## 2014-05-12 ENCOUNTER — Ambulatory Visit (INDEPENDENT_AMBULATORY_CARE_PROVIDER_SITE_OTHER): Payer: BC Managed Care – PPO | Admitting: Family Medicine

## 2014-05-12 VITALS — BP 144/82 | HR 84 | Temp 98.4°F | Resp 16 | Ht 63.0 in | Wt 207.0 lb

## 2014-05-12 DIAGNOSIS — R059 Cough, unspecified: Secondary | ICD-10-CM

## 2014-05-12 DIAGNOSIS — R911 Solitary pulmonary nodule: Secondary | ICD-10-CM

## 2014-05-12 DIAGNOSIS — I10 Essential (primary) hypertension: Secondary | ICD-10-CM

## 2014-05-12 DIAGNOSIS — R209 Unspecified disturbances of skin sensation: Secondary | ICD-10-CM

## 2014-05-12 DIAGNOSIS — E785 Hyperlipidemia, unspecified: Secondary | ICD-10-CM

## 2014-05-12 DIAGNOSIS — R202 Paresthesia of skin: Secondary | ICD-10-CM

## 2014-05-12 DIAGNOSIS — R05 Cough: Secondary | ICD-10-CM

## 2014-05-12 DIAGNOSIS — J309 Allergic rhinitis, unspecified: Secondary | ICD-10-CM

## 2014-05-12 DIAGNOSIS — R7309 Other abnormal glucose: Secondary | ICD-10-CM

## 2014-05-12 DIAGNOSIS — R7303 Prediabetes: Secondary | ICD-10-CM

## 2014-05-12 DIAGNOSIS — R739 Hyperglycemia, unspecified: Secondary | ICD-10-CM

## 2014-05-12 LAB — COMPREHENSIVE METABOLIC PANEL
ALBUMIN: 4.5 g/dL (ref 3.5–5.2)
ALT: 27 U/L (ref 0–35)
AST: 25 U/L (ref 0–37)
Alkaline Phosphatase: 81 U/L (ref 39–117)
BUN: 14 mg/dL (ref 6–23)
CALCIUM: 9.2 mg/dL (ref 8.4–10.5)
CHLORIDE: 106 meq/L (ref 96–112)
CO2: 20 meq/L (ref 19–32)
Creat: 0.64 mg/dL (ref 0.50–1.10)
GLUCOSE: 128 mg/dL — AB (ref 70–99)
POTASSIUM: 4 meq/L (ref 3.5–5.3)
Sodium: 140 mEq/L (ref 135–145)
Total Bilirubin: 0.6 mg/dL (ref 0.2–1.2)
Total Protein: 6.4 g/dL (ref 6.0–8.3)

## 2014-05-12 LAB — POCT GLYCOSYLATED HEMOGLOBIN (HGB A1C): Hemoglobin A1C: 6.3

## 2014-05-12 LAB — LIPID PANEL
CHOLESTEROL: 241 mg/dL — AB (ref 0–200)
HDL: 42 mg/dL (ref >39)
LDL Cholesterol: 145 mg/dL — ABNORMAL HIGH (ref 0–99)
Total CHOL/HDL Ratio: 5.7 Ratio
Triglycerides: 270 mg/dL — ABNORMAL HIGH (ref <150)
VLDL: 54 mg/dL — ABNORMAL HIGH (ref 0–40)

## 2014-05-12 LAB — GLUCOSE, POCT (MANUAL RESULT ENTRY)

## 2014-05-12 LAB — TSH: TSH: 0.702 u[IU]/mL (ref 0.350–4.500)

## 2014-05-12 MED ORDER — METFORMIN HCL 500 MG PO TABS: 500.0000 mg | ORAL_TABLET | Freq: Every day | ORAL | Status: DC

## 2014-05-12 MED ORDER — FLUTICASONE PROPIONATE 50 MCG/ACT NA SUSP: 2.0000 | Freq: Every day | NASAL | Status: DC

## 2014-05-12 NOTE — Patient Instructions (Addendum)
You should receive a call or letter about your lab results within the next week to 10 days. Can determine then if cholesterol med recommended.  Start flonase nasal spray and claritin once per needed for allergies.  If cough not improving in next 4-6 weeks - return to discuss further.  Return in next 4-6 weeks to discuss tingling in toes further, as well as other concerns at that time. Blood sugar improving. Continue diet changes, exercise 150 minutes per week.   We will schedule repeat chest CT for lung nodule.  Return to the clinic or go to the nearest emergency room if any of your symptoms worsen or new symptoms occur.

## 2014-05-12 NOTE — Progress Notes (Signed)
Subjective:    Patient ID: Abigail [name], female    DOB: [birth year], 63 y.o.   MRN: [id number]  HPI Abigail [name] is a 63 y.o. female YSH:UOHFGB,MSXJDBZ R, MD  Here for multiple concerns.   Prediabetes/Hyperglycemia - A1c 6.5 in 07/2013. Started metformin 572m QD, then later decreased to 1/2 per day d/t GI intolerance, then able to increase back to full pill.  Home blood sugars 128-168 on 1/2 pill. Not checking since on whole pill.  Weight down from 213 to 207 today.  No regular exercise at this point, but walking up steps throughout day and walking around farm. Cut back on sugar containing drinks, more water, more veggies. Fast food once per week.  Occasional diarrhea every few days.   Started on Humira from dermatologist - CZannie Kehron LLander For her psoriasis.  1 shot every other week. Concerned about possible side effects- slight cough every now and then.  Started in beginning of spring. Has had some sinus issues at times. Occasional phlegm.    Hyperlipidemia - on Zocor 297mqd prior, but off meds at times prior to last ov. Neck and shoulder soreness at last ov. CK level normal, Lft's normal. Stopped taking about 4 months ago.  "Didn't like it", but no specific side effects. Concerned about what heard on TV. Fasting today,   Lab Results  Component Value Date   CHOL 184 07/09/2013   HDL 44 07/09/2013   LDLCALC 99 07/09/2013   TRIG 205* 07/09/2013   CHOLHDL 4.2 07/09/2013   Lung nodule -24m38monspecific left lung nodule on 7/14 CT. see prior ov - nonsmoker, but has had 2nd hand smoke over the years. planning on repeat CT in 1 year. Has not had this scheduled yet.   Patient Active Problem List   Diagnosis Date Noted  . 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 Pulmonary nodule 05/21/2013  . Chest pain 05/20/2013  . IRRITABLE BOWEL SYNDROME 05/18/2008  . HYPERLIPIDEMIA 05/17/2008  . HYPERTENSION 05/17/2008  . HEMORRHOIDS 05/17/2008  . GERD 05/17/2008  . FATTY LIVER DISEASE 05/17/2008    . PSORIASIS 05/17/2008  . ARTHRITIS 05/17/2008  . DIARRHEA 05/17/2008  . STRESS INCONTINENCE 05/17/2008   Past Medical History  Diagnosis Date  . Hemorrhoids   . Psoriasis   . Arthritis   . Female stress incontinence   . Fatty liver   . Hypertension   . Hyperlipidemia   . GERD (gastroesophageal reflux disease)   . IBS (irritable bowel syndrome)   . Hyperplastic colon polyp   . COPD (chronic obstructive pulmonary disease)   . Obesity   . Cancer   . Skin cancer   . Lung nodule     a. 24mm66mft lung nodule by CT 05/2013.  . HyMarland Kitchenertriglyceridemia   . Diabetes mellitus     a. A1c 6.6 in 05/2013 indicating new dx.  . Heart murmur    Past Surgical History  Procedure Laterality Date  . Shoulder surgery      right  . Ventral hernia repair    . Nasal sinus surgery    . Tonsillectomy    . Mouth surgery    . Hernia repair     No Known Allergies Prior to Admission medications   Medication Sig Start Date End Date Taking? Authorizing Provider  aspirin 81 MG tablet Take 81 mg by mouth daily.   Yes Historical Provider, MD  dicyclomine (BENTYL) 20 MG tablet Take 20 mg by mouth every 6 (six) hours  as needed.     Historical Provider, MD  glucose blood (FREESTYLE LITE) test strip 1 each by Other route daily. Use as instructed   Yes Historical Provider, MD  glucose monitoring kit (FREESTYLE) monitoring kit 1 each by Does not apply route as needed for other. Dispense one glucometer of choice, testing strips/lancets for once per day glucose checks.  QS for 1 month, 11 refills. 05/28/13  Yes Wendie Agreste, MD  lisinopril-hydrochlorothiazide (PRINZIDE,ZESTORETIC) 10-12.5 MG per tablet Take 1 tablet by mouth daily.     Yes Historical Provider, MD  metFORMIN (GLUCOPHAGE) 500 MG tablet Take 1 tablet (500 mg total) by mouth daily with breakfast. 07/09/13  Yes Wendie Agreste, MD  omeprazole (PRILOSEC) 40 MG capsule TAKE ONE CAPSULE TWICE A DAY 03/15/14  Yes Lafayette Dragon, MD   History   Social  History  . Marital Status: Married    Spouse Name: N/A    Number of Children: 2  . Years of Education: N/A   Occupational History  . retired    Social History Main Topics  . Smoking status: Never Smoker   . Smokeless tobacco: Never Used  . Alcohol Use: No  . Drug Use: No  . Sexual Activity: Yes    Birth Control/ Protection: None   Other Topics Concern  . Not on file   Social History Narrative  . No narrative on file       Review of Systems  Constitutional: Negative for fatigue and unexpected weight change.  HENT: Positive for congestion and sinus pressure.   Respiratory: Positive for cough. Negative for chest tightness and shortness of breath.   Cardiovascular: Negative for chest pain, palpitations and leg swelling.  Gastrointestinal: Negative for abdominal pain and blood in stool.  Neurological: Negative for dizziness, syncope, light-headedness and headaches.       Tingling/cramps in feet at times.        Objective:   Physical Exam  Vitals reviewed. Constitutional: She is oriented to person, place, and time. She appears well-developed and well-nourished.  HENT:  Head: Normocephalic and atraumatic.  Eyes: Conjunctivae and EOM are normal. Pupils are equal, round, and reactive to light.  Neck: Carotid bruit is not present.  Cardiovascular: Normal rate, regular rhythm, normal heart sounds and intact distal pulses.   Pulmonary/Chest: Effort normal and breath sounds normal.  Abdominal: Soft. She exhibits no pulsatile midline mass. There is no tenderness.  Neurological: She is alert and oriented to person, place, and time.  Skin: Skin is warm and dry.  Psychiatric: She has a normal mood and affect. Her behavior is normal.   Filed Vitals:   05/12/14 0828  BP: 144/82  Pulse: 84  Temp: 98.4 F (36.9 C)  Resp: 16  Height: 5' 3" (1.6 m)  Weight: 207 lb (93.895 kg)  SpO2: 95%    Results for orders placed in visit on 05/12/14  GLUCOSE, POCT (MANUAL RESULT ENTRY)        Result Value Ref Range   POC Glucose 184f 70 - 99 mg/dl  POCT GLYCOSYLATED HEMOGLOBIN (HGB A1C)      Result Value Ref Range   Hemoglobin A1C 6.3         Assessment & Plan:   Abigail ROEBUCKis a 63y.o. female Hyperlipemia - Plan: POCT glucose (manual entry), POCT glycosylated hemoglobin (Hb A1C), Comprehensive metabolic panel, Lipid panel, TSH - lipids pending. Calculate ASCVD risk off meds, then can discuss med options.   Essential hypertension -  Plan: Comprehensive metabolic panel, Lipid panel, TSH - stable/borderline. No new changes.   Tingling - Plan: TSH.  Follow up to discuss further.   Prediabetes/Hyperglycemia - Plan: metFORMIN (GLUCOPHAGE) 500 MG tablet   - Plan: POCT glucose (manual entry), POCT glycosylated hemoglobin (Hb A1C) - improved - cont metformin, wt loss, exercise.   Cough - Plan: Comprehensive metabolic panel, Lipid panel, TSH - allergic rhinitis possible. claritin otc, and flonase if needed.  If cough persists - consider ACE-I cough. rtc precautions.   Lung nodule - Plan: CT Chest Wo Contrast - to recheck.  No he moptysis, nt sweats, f/c.   Recheck in 4-6 weeks.    Meds ordered this encounter  Medications  . metFORMIN (GLUCOPHAGE) 500 MG tablet    Sig: Take 1 tablet (500 mg total) by mouth daily with breakfast.    Dispense:  90 tablet    Refill:  1   Patient Instructions  You should receive a call or letter about your lab results within the next week to 10 days. Can determine then if cholesterol med recommended.  Start flonase nasal spray and claritin once per needed for allergies.  If cough not improving in next 4-6 weeks - return to discuss further.  Return in next 4-6 weeks to discuss tingling in toes further, as well as other concerns at that time. Blood sugar improving. Continue diet changes, exercise 150 minutes per week.   We will schedule repeat chest CT for lung nodule.  Return to the clinic or go to the nearest emergency room if any of  your symptoms worsen or new symptoms occur.

## 2014-05-13 ENCOUNTER — Telehealth: Payer: Self-pay

## 2014-05-13 NOTE — Telephone Encounter (Signed)
Please call [facility]  At [phone] with id number [id number] to give clinicals for ct chest wo

## 2014-05-17 NOTE — Telephone Encounter (Signed)
Spoke to [employer], this has already been authorized.

## 2014-05-23 ENCOUNTER — Ambulatory Visit
Admission: RE | Admit: 2014-05-23 | Discharge: 2014-05-23 | Disposition: A | Discharge status: Home / Self Care | Payer: BC Managed Care – PPO | Source: Ambulatory Visit | Attending: Family Medicine | Admitting: Family Medicine

## 2014-05-25 ENCOUNTER — Other Ambulatory Visit: Payer: Self-pay | Admitting: Family Medicine

## 2014-05-25 DIAGNOSIS — E785 Hyperlipidemia, unspecified: Secondary | ICD-10-CM

## 2014-05-25 MED ORDER — ROSUVASTATIN CALCIUM 10 MG PO TABS: 10.0000 mg | ORAL_TABLET | Freq: Every day | ORAL | Status: DC

## 2014-06-14 DIAGNOSIS — Z09 Encounter for follow-up examination after completed treatment for conditions other than malignant neoplasm: Secondary | ICD-10-CM | POA: Diagnosis not present

## 2014-06-14 DIAGNOSIS — Z79899 Other long term (current) drug therapy: Secondary | ICD-10-CM | POA: Diagnosis not present

## 2014-06-14 DIAGNOSIS — L408 Other psoriasis: Secondary | ICD-10-CM | POA: Diagnosis not present

## 2014-07-19 ENCOUNTER — Other Ambulatory Visit: Payer: Self-pay

## 2014-07-19 MED ORDER — GLUCOSE BLOOD VI STRP: ORAL_STRIP | Status: DC

## 2014-08-17 ENCOUNTER — Ambulatory Visit: Payer: Self-pay | Admitting: Family Medicine

## 2014-08-17 DIAGNOSIS — Z1231 Encounter for screening mammogram for malignant neoplasm of breast: Secondary | ICD-10-CM | POA: Diagnosis not present

## 2014-08-22 DIAGNOSIS — H2513 Age-related nuclear cataract, bilateral: Secondary | ICD-10-CM | POA: Diagnosis not present

## 2014-08-22 DIAGNOSIS — E119 Type 2 diabetes mellitus without complications: Secondary | ICD-10-CM | POA: Diagnosis not present

## 2014-08-29 ENCOUNTER — Encounter (HOSPITAL_COMMUNITY): Payer: Self-pay | Admitting: Pharmacy Technician

## 2014-09-05 ENCOUNTER — Encounter (HOSPITAL_COMMUNITY): Admission: RE | Admit: 2014-09-05 | Payer: BC Managed Care – PPO | Source: Ambulatory Visit

## 2014-09-13 DIAGNOSIS — Z09 Encounter for follow-up examination after completed treatment for conditions other than malignant neoplasm: Secondary | ICD-10-CM | POA: Diagnosis not present

## 2014-09-13 DIAGNOSIS — L409 Psoriasis, unspecified: Secondary | ICD-10-CM | POA: Diagnosis not present

## 2014-09-13 DIAGNOSIS — Z5181 Encounter for therapeutic drug level monitoring: Secondary | ICD-10-CM | POA: Diagnosis not present

## 2014-09-13 DIAGNOSIS — L570 Actinic keratosis: Secondary | ICD-10-CM | POA: Diagnosis not present

## 2014-09-13 DIAGNOSIS — Z79899 Other long term (current) drug therapy: Secondary | ICD-10-CM | POA: Diagnosis not present

## 2014-09-26 DIAGNOSIS — H2512 Age-related nuclear cataract, left eye: Secondary | ICD-10-CM | POA: Diagnosis not present

## 2014-09-27 NOTE — Patient Instructions (Signed)
Your procedure is scheduled on: 10/06/2014  Report to [facility] at  79  AM.  Call this number if you have problems the morning of surgery: [phone]   Do not eat food or drink liquids :After Midnight.      Take these medicines the morning of surgery with A SIP OF WATER: lisinopril, prilosec   Do not wear jewelry, make-up or nail polish.  Do not wear lotions, powders, or perfumes.   Do not shave 48 hours prior to surgery.  Do not bring valuables to the hospital.  Contacts, dentures or bridgework may not be worn into surgery.  Leave suitcase in the car. After surgery it may be brought to your room.  For patients admitted to the hospital, checkout time is 11:00 AM the day of discharge.   Patients discharged the day of surgery will not be allowed to drive home.  :     Please read over the following fact sheets that you were given: Coughing and Deep Breathing, Surgical Site Infection Prevention, Anesthesia Post-op Instructions and Care and Recovery After Surgery    Cataract A cataract is a clouding of the lens of the eye. When a lens becomes cloudy, vision is reduced based on the degree and nature of the clouding. Many cataracts reduce vision to some degree. Some cataracts make people more near-sighted as they develop. Other cataracts increase glare. Cataracts that are ignored and become worse can sometimes look white. The white color can be seen through the pupil. CAUSES   Aging. However, cataracts may occur at any age, even in newborns.   Certain drugs.   Trauma to the eye.   Certain diseases such as diabetes.   Specific eye diseases such as chronic inflammation inside the eye or a sudden attack of a rare form of glaucoma.   Inherited or acquired medical problems.  SYMPTOMS   Gradual, progressive drop in vision in the affected eye.   Severe, rapid visual loss. This most often happens when trauma is the cause.  DIAGNOSIS  To detect a cataract, an eye doctor examines the lens.  Cataracts are best diagnosed with an exam of the eyes with the pupils enlarged (dilated) by drops.  TREATMENT  For an early cataract, vision may improve by using different eyeglasses or stronger lighting. If that does not help your vision, surgery is the only effective treatment. A cataract needs to be surgically removed when vision loss interferes with your everyday activities, such as driving, reading, or watching TV. A cataract may also have to be removed if it prevents examination or treatment of another eye problem. Surgery removes the cloudy lens and usually replaces it with a substitute lens (intraocular lens, IOL).  At a time when both you and your doctor agree, the cataract will be surgically removed. If you have cataracts in both eyes, only one is usually removed at a time. This allows the operated eye to heal and be out of danger from any possible problems after surgery (such as infection or poor wound healing). In rare cases, a cataract may be doing damage to your eye. In these cases, your caregiver may advise surgical removal right away. The vast majority of people who have cataract surgery have better vision afterward. HOME CARE INSTRUCTIONS  If you are not planning surgery, you may be asked to do the following:  Use different eyeglasses.   Use stronger or brighter lighting.   Ask your eye doctor about reducing your medicine dose or changing medicines  if it is thought that a medicine caused your cataract. Changing medicines does not make the cataract go away on its own.   Become familiar with your surroundings. Poor vision can lead to injury. Avoid bumping into things on the affected side. You are at a higher risk for tripping or falling.   Exercise extreme care when driving or operating machinery.   Wear sunglasses if you are sensitive to bright light or experiencing problems with glare.  SEEK IMMEDIATE MEDICAL CARE IF:   You have a worsening or sudden vision loss.   You notice  redness, swelling, or increasing pain in the eye.   You have a fever.  Document Released: 10/21/2005 Document Revised: 10/10/2011 Document Reviewed: 06/14/2011 [facility] Patient Information 2012 [place].PATIENT INSTRUCTIONS POST-ANESTHESIA  IMMEDIATELY FOLLOWING SURGERY:  Do not drive or operate machinery for the first twenty four hours after surgery.  Do not make any important decisions for twenty four hours after surgery or while taking narcotic pain medications or sedatives.  If you develop intractable nausea and vomiting or a severe headache please notify your doctor immediately.  FOLLOW-UP:  Please make an appointment with your surgeon as instructed. You do not need to follow up with anesthesia unless specifically instructed to do so.  WOUND CARE INSTRUCTIONS (if applicable):  Keep a dry clean dressing on the anesthesia/puncture wound site if there is drainage.  Once the wound has quit draining you may leave it open to air.  Generally you should leave the bandage intact for twenty four hours unless there is drainage.  If the epidural site drains for more than 36-48 hours please call the anesthesia department.  QUESTIONS?:  Please feel free to call your physician or the hospital operator if you have any questions, and they will be happy to assist you.

## 2014-09-30 ENCOUNTER — Other Ambulatory Visit (HOSPITAL_COMMUNITY): Payer: Medicare Other

## 2014-10-03 ENCOUNTER — Encounter (HOSPITAL_COMMUNITY)
Admission: RE | Admit: 2014-10-03 | Discharge: 2014-10-03 | Disposition: A | Discharge status: Home / Self Care | Payer: Medicare Other | Source: Ambulatory Visit | Attending: Ophthalmology | Admitting: Ophthalmology

## 2014-10-03 ENCOUNTER — Other Ambulatory Visit: Payer: Self-pay

## 2014-10-03 ENCOUNTER — Encounter (HOSPITAL_COMMUNITY): Payer: Self-pay

## 2014-10-03 DIAGNOSIS — N39498 Other specified urinary incontinence: Secondary | ICD-10-CM | POA: Diagnosis not present

## 2014-10-03 DIAGNOSIS — Z85828 Personal history of other malignant neoplasm of skin: Secondary | ICD-10-CM | POA: Diagnosis not present

## 2014-10-03 DIAGNOSIS — I10 Essential (primary) hypertension: Secondary | ICD-10-CM | POA: Diagnosis not present

## 2014-10-03 DIAGNOSIS — E119 Type 2 diabetes mellitus without complications: Secondary | ICD-10-CM | POA: Diagnosis not present

## 2014-10-03 DIAGNOSIS — L408 Other psoriasis: Secondary | ICD-10-CM | POA: Diagnosis not present

## 2014-10-03 DIAGNOSIS — K589 Irritable bowel syndrome without diarrhea: Secondary | ICD-10-CM | POA: Diagnosis not present

## 2014-10-03 DIAGNOSIS — K219 Gastro-esophageal reflux disease without esophagitis: Secondary | ICD-10-CM | POA: Diagnosis not present

## 2014-10-03 DIAGNOSIS — K2940 Chronic atrophic gastritis without bleeding: Secondary | ICD-10-CM | POA: Diagnosis not present

## 2014-10-03 DIAGNOSIS — M129 Arthropathy, unspecified: Secondary | ICD-10-CM | POA: Diagnosis not present

## 2014-10-03 DIAGNOSIS — Z803 Family history of malignant neoplasm of breast: Secondary | ICD-10-CM | POA: Diagnosis not present

## 2014-10-03 DIAGNOSIS — K649 Unspecified hemorrhoids: Secondary | ICD-10-CM | POA: Diagnosis not present

## 2014-10-03 DIAGNOSIS — E785 Hyperlipidemia, unspecified: Secondary | ICD-10-CM | POA: Diagnosis not present

## 2014-10-03 DIAGNOSIS — H2512 Age-related nuclear cataract, left eye: Secondary | ICD-10-CM | POA: Diagnosis not present

## 2014-10-03 DIAGNOSIS — K7689 Other specified diseases of liver: Secondary | ICD-10-CM | POA: Diagnosis not present

## 2014-10-03 DIAGNOSIS — Z8371 Family history of colonic polyps: Secondary | ICD-10-CM | POA: Diagnosis not present

## 2014-10-03 DIAGNOSIS — R011 Cardiac murmur, unspecified: Secondary | ICD-10-CM | POA: Diagnosis not present

## 2014-10-03 DIAGNOSIS — Z8249 Family history of ischemic heart disease and other diseases of the circulatory system: Secondary | ICD-10-CM | POA: Diagnosis not present

## 2014-10-03 DIAGNOSIS — R911 Solitary pulmonary nodule: Secondary | ICD-10-CM | POA: Diagnosis not present

## 2014-10-03 LAB — BASIC METABOLIC PANEL
Anion gap: 13 (ref 5–15)
BUN: 14 mg/dL (ref 6–23)
CO2: 25 mEq/L (ref 19–32)
CREATININE: 0.66 mg/dL (ref 0.50–1.10)
Calcium: 9 mg/dL (ref 8.4–10.5)
Chloride: 104 mEq/L (ref 96–112)
GFR calc Af Amer: >90 mL/min (ref >90)
Glucose, Bld: 117 mg/dL — ABNORMAL HIGH (ref 70–99)
POTASSIUM: 4.2 meq/L (ref 3.7–5.3)
Sodium: 142 mEq/L (ref 137–147)

## 2014-10-03 LAB — HEMOGLOBIN AND HEMATOCRIT, BLOOD
HCT: 42.3 % (ref 36.0–46.0)
HEMOGLOBIN: 14.5 g/dL (ref 12.0–15.0)

## 2014-10-06 ENCOUNTER — Encounter (HOSPITAL_COMMUNITY): Payer: Self-pay | Admitting: *Deleted

## 2014-10-06 ENCOUNTER — Ambulatory Visit (HOSPITAL_COMMUNITY)
Admission: RE | Admit: 2014-10-06 | Discharge: 2014-10-06 | Disposition: A | Discharge status: Home / Self Care | Payer: Medicare Other | Source: Ambulatory Visit | Attending: Ophthalmology | Admitting: Ophthalmology

## 2014-10-06 ENCOUNTER — Ambulatory Visit (HOSPITAL_COMMUNITY): Payer: Medicare Other | Admitting: Anesthesiology

## 2014-10-06 ENCOUNTER — Encounter (HOSPITAL_COMMUNITY)
Admission: RE | Disposition: A | Discharge status: Home / Self Care | Payer: Self-pay | Source: Ambulatory Visit | Attending: Ophthalmology

## 2014-10-06 DIAGNOSIS — Z8249 Family history of ischemic heart disease and other diseases of the circulatory system: Secondary | ICD-10-CM | POA: Insufficient documentation

## 2014-10-06 DIAGNOSIS — E785 Hyperlipidemia, unspecified: Secondary | ICD-10-CM | POA: Diagnosis not present

## 2014-10-06 DIAGNOSIS — K589 Irritable bowel syndrome without diarrhea: Secondary | ICD-10-CM | POA: Insufficient documentation

## 2014-10-06 DIAGNOSIS — Z8371 Family history of colonic polyps: Secondary | ICD-10-CM | POA: Insufficient documentation

## 2014-10-06 DIAGNOSIS — K649 Unspecified hemorrhoids: Secondary | ICD-10-CM | POA: Diagnosis not present

## 2014-10-06 DIAGNOSIS — N39498 Other specified urinary incontinence: Secondary | ICD-10-CM | POA: Insufficient documentation

## 2014-10-06 DIAGNOSIS — H2512 Age-related nuclear cataract, left eye: Secondary | ICD-10-CM | POA: Diagnosis not present

## 2014-10-06 DIAGNOSIS — K219 Gastro-esophageal reflux disease without esophagitis: Secondary | ICD-10-CM | POA: Diagnosis not present

## 2014-10-06 DIAGNOSIS — M129 Arthropathy, unspecified: Secondary | ICD-10-CM | POA: Insufficient documentation

## 2014-10-06 DIAGNOSIS — K7689 Other specified diseases of liver: Secondary | ICD-10-CM | POA: Insufficient documentation

## 2014-10-06 DIAGNOSIS — Z803 Family history of malignant neoplasm of breast: Secondary | ICD-10-CM | POA: Insufficient documentation

## 2014-10-06 DIAGNOSIS — H259 Unspecified age-related cataract: Secondary | ICD-10-CM | POA: Diagnosis not present

## 2014-10-06 DIAGNOSIS — Z85828 Personal history of other malignant neoplasm of skin: Secondary | ICD-10-CM | POA: Insufficient documentation

## 2014-10-06 DIAGNOSIS — I10 Essential (primary) hypertension: Secondary | ICD-10-CM | POA: Diagnosis not present

## 2014-10-06 DIAGNOSIS — E119 Type 2 diabetes mellitus without complications: Secondary | ICD-10-CM | POA: Insufficient documentation

## 2014-10-06 DIAGNOSIS — R011 Cardiac murmur, unspecified: Secondary | ICD-10-CM | POA: Insufficient documentation

## 2014-10-06 DIAGNOSIS — L408 Other psoriasis: Secondary | ICD-10-CM | POA: Insufficient documentation

## 2014-10-06 DIAGNOSIS — K2940 Chronic atrophic gastritis without bleeding: Secondary | ICD-10-CM | POA: Insufficient documentation

## 2014-10-06 DIAGNOSIS — R911 Solitary pulmonary nodule: Secondary | ICD-10-CM | POA: Insufficient documentation

## 2014-10-06 HISTORY — PX: CATARACT EXTRACTION W/PHACO: SHX586

## 2014-10-06 LAB — GLUCOSE, CAPILLARY: Glucose-Capillary: 134 mg/dL — ABNORMAL HIGH (ref 70–99)

## 2014-10-06 SURGERY — PHACOEMULSIFICATION, CATARACT, WITH IOL INSERTION
Anesthesia: Monitor Anesthesia Care | Site: Eye | Laterality: Left

## 2014-10-06 MED ORDER — LIDOCAINE HCL 3.5 % OP GEL
1.0000 "application " | Freq: Once | OPHTHALMIC | Status: AC
  Administered 2014-10-06: 1 via OPHTHALMIC

## 2014-10-06 MED ORDER — EPINEPHRINE HCL 1 MG/ML IJ SOLN
INTRAMUSCULAR | Status: AC
  Filled 2014-10-06: qty 1

## 2014-10-06 MED ORDER — LACTATED RINGERS IV SOLN
INTRAVENOUS | Status: DC | PRN
Start: 2014-10-06 — End: 2014-10-06
  Administered 2014-10-06: 09:00:00 via INTRAVENOUS

## 2014-10-06 MED ORDER — FENTANYL CITRATE 0.05 MG/ML IJ SOLN
25.0000 ug | INTRAMUSCULAR | Status: AC
  Administered 2014-10-06 (×2): 25 ug via INTRAVENOUS

## 2014-10-06 MED ORDER — LACTATED RINGERS IV SOLN
INTRAVENOUS | Status: DC
  Administered 2014-10-06: 09:00:00 via INTRAVENOUS

## 2014-10-06 MED ORDER — KETOROLAC TROMETHAMINE 0.5 % OP SOLN: 1.0000 [drp] | OPHTHALMIC | Status: DC

## 2014-10-06 MED ORDER — PROVISC 10 MG/ML IO SOLN
INTRAOCULAR | Status: DC | PRN
  Administered 2014-10-06: 0.85 mL via INTRAOCULAR

## 2014-10-06 MED ORDER — BSS IO SOLN
INTRAOCULAR | Status: DC | PRN
  Administered 2014-10-06: 15 mL

## 2014-10-06 MED ORDER — TETRACAINE HCL 0.5 % OP SOLN
1.0000 [drp] | OPHTHALMIC | Status: AC | PRN
  Administered 2014-10-06 (×3): 1 [drp] via OPHTHALMIC

## 2014-10-06 MED ORDER — POVIDONE-IODINE 5 % OP SOLN
OPHTHALMIC | Status: DC | PRN
  Administered 2014-10-06: 1 via OPHTHALMIC

## 2014-10-06 MED ORDER — BSS IO SOLN
INTRAOCULAR | Status: DC | PRN
  Administered 2014-10-06: 500 mL

## 2014-10-06 MED ORDER — MIDAZOLAM HCL 2 MG/2ML IJ SOLN
INTRAMUSCULAR | Status: AC
Start: 2014-10-06 — End: 2014-10-06
  Filled 2014-10-06: qty 2

## 2014-10-06 MED ORDER — NEOMYCIN-POLYMYXIN-DEXAMETH 3.5-10000-0.1 OP SUSP
OPHTHALMIC | Status: DC | PRN
  Administered 2014-10-06: 2 [drp] via OPHTHALMIC

## 2014-10-06 MED ORDER — LIDOCAINE HCL (PF) 1 % IJ SOLN
INTRAMUSCULAR | Status: DC | PRN
  Administered 2014-10-06: .6 mL

## 2014-10-06 MED ORDER — PHENYLEPHRINE HCL 2.5 % OP SOLN
1.0000 [drp] | OPHTHALMIC | Status: AC | PRN
  Administered 2014-10-06 (×3): 1 [drp] via OPHTHALMIC

## 2014-10-06 MED ORDER — CYCLOPENTOLATE-PHENYLEPHRINE 0.2-1 % OP SOLN
1.0000 [drp] | OPHTHALMIC | Status: AC
  Administered 2014-10-06 (×3): 1 [drp] via OPHTHALMIC

## 2014-10-06 MED ORDER — FENTANYL CITRATE 0.05 MG/ML IJ SOLN
INTRAMUSCULAR | Status: AC
  Filled 2014-10-06: qty 2

## 2014-10-06 MED ORDER — MIDAZOLAM HCL 2 MG/2ML IJ SOLN
1.0000 mg | INTRAMUSCULAR | Status: DC | PRN
  Administered 2014-10-06: 2 mg via INTRAVENOUS

## 2014-10-06 SURGICAL SUPPLY — 33 items
CAPSULAR TENSION RING-AMO (OPHTHALMIC RELATED) IMPLANT
CLOTH BEACON ORANGE TIMEOUT ST (SAFETY) ×2 IMPLANT
EYE SHIELD UNIVERSAL CLEAR (GAUZE/BANDAGES/DRESSINGS) ×2 IMPLANT
GLOVE BIO SURGEON STRL SZ 6.5 (GLOVE) IMPLANT
GLOVE BIOGEL PI IND STRL 6.5 (GLOVE) ×2 IMPLANT
GLOVE BIOGEL PI IND STRL 7.0 (GLOVE) IMPLANT
GLOVE BIOGEL PI IND STRL 7.5 (GLOVE) IMPLANT
GLOVE BIOGEL PI INDICATOR 6.5 (GLOVE) ×2
GLOVE BIOGEL PI INDICATOR 7.0 (GLOVE)
GLOVE BIOGEL PI INDICATOR 7.5 (GLOVE)
GLOVE ECLIPSE 6.5 STRL STRAW (GLOVE) IMPLANT
GLOVE ECLIPSE 7.0 STRL STRAW (GLOVE) IMPLANT
GLOVE ECLIPSE 7.5 STRL STRAW (GLOVE) IMPLANT
GLOVE EXAM NITRILE LRG STRL (GLOVE) IMPLANT
GLOVE EXAM NITRILE MD LF STRL (GLOVE) IMPLANT
GLOVE SKINSENSE NS SZ6.5 (GLOVE)
GLOVE SKINSENSE NS SZ7.0 (GLOVE)
GLOVE SKINSENSE STRL SZ6.5 (GLOVE) IMPLANT
GLOVE SKINSENSE STRL SZ7.0 (GLOVE) IMPLANT
KIT VITRECTOMY (OPHTHALMIC RELATED) IMPLANT
PAD ARMBOARD 7.5X6 YLW CONV (MISCELLANEOUS) ×2 IMPLANT
PROC W NO LENS (INTRAOCULAR LENS)
PROC W SPEC LENS (INTRAOCULAR LENS)
PROCESS W NO LENS (INTRAOCULAR LENS) IMPLANT
PROCESS W SPEC LENS (INTRAOCULAR LENS) IMPLANT
RETRACTOR IRIS SIGHTPATH (OPHTHALMIC RELATED) IMPLANT
RING MALYGIN (MISCELLANEOUS) IMPLANT
SIGHTPATH CAT PROC W REG LENS (Ophthalmic Related) ×2 IMPLANT
SYRINGE LUER LOK 1CC (MISCELLANEOUS) ×2 IMPLANT
TAPE SURG TRANSPORE 1 IN (GAUZE/BANDAGES/DRESSINGS) ×1 IMPLANT
TAPE SURGICAL TRANSPORE 1 IN (GAUZE/BANDAGES/DRESSINGS) ×1
VISCOELASTIC ADDITIONAL (OPHTHALMIC RELATED) IMPLANT
WATER STERILE IRR 250ML POUR (IV SOLUTION) ×2 IMPLANT

## 2014-10-06 NOTE — H&P (Signed)
I have reviewed the H&P, the patient was re-examined, and I have identified no interval changes in medical condition and plan of care since the history and physical of record  

## 2014-10-06 NOTE — Anesthesia Procedure Notes (Signed)
Procedure Name: MAC Date/Time: 10/06/2014 9:32 AM Performed by: Andree Elk, AMY A Pre-anesthesia Checklist: Patient identified, Timeout performed, Emergency Drugs available, Suction available and Patient being monitored Oxygen Delivery Method: Nasal cannula

## 2014-10-06 NOTE — Op Note (Signed)
Date of Admission: 10/06/2014  Date of Surgery: 10/06/2014   Pre-Op Dx: Cataract Left Eye  Post-Op Dx: Senile Nuclear Cataract Left  Eye,  Dx Code H25.12  Surgeon: Tonny Branch, M.D.  Assistants: None  Anesthesia: Topical with MAC  Indications: Painless, progressive loss of vision with compromise of daily activities.  Surgery: Cataract Extraction with Intraocular lens Implant Left Eye  Discription: The patient had dilating drops and viscous lidocaine placed into the Left eye in the pre-op holding area. After transfer to the operating room, a time out was performed. The patient was then prepped and draped. Beginning with a 80 degree blade a paracentesis port was made at the surgeon's 2 o'clock position. The anterior chamber was then filled with 1% non-preserved lidocaine. This was followed by filling the anterior chamber with Provisc.  A 2.70mm keratome blade was used to make a clear corneal incision at the temporal limbus.  A bent cystatome needle was used to create a continuous tear capsulotomy. Hydrodissection was performed with balanced salt solution on a Fine canula. The lens nucleus was then removed using the phacoemulsification handpiece. Residual cortex was removed with the I&A handpiece. The anterior chamber and capsular bag were refilled with Provisc. A posterior chamber intraocular lens was placed into the capsular bag with it's injector. The implant was positioned with the Kuglan hook. The Provisc was then removed from the anterior chamber and capsular bag with the I&A handpiece. Stromal hydration of the main incision and paracentesis port was performed with BSS on a Fine canula. The wounds were tested for leak which was negative. The patient tolerated the procedure well. There were no operative complications. The patient was then transferred to the recovery room in stable condition.  Complications: None  Specimen: None  EBL: None  Prosthetic device: Hoya iSert 250, power 21.5 D, SN  [id number].

## 2014-10-06 NOTE — Transfer of Care (Signed)
Immediate Anesthesia Transfer of Care Note  Patient: Abigail [name]  Procedure(s) Performed: Procedure(s) with comments: CATARACT EXTRACTION PHACO AND INTRAOCULAR LENS PLACEMENT LEFT EYE (Left) - CDE 7.35  Patient Location: Short Stay  Anesthesia Type:MAC  Level of Consciousness: awake, alert , oriented and patient cooperative  Airway & Oxygen Therapy: Patient Spontanous Breathing  Post-op Assessment: Report given to PACU RN and Post -op Vital signs reviewed and stable  Post vital signs: Reviewed and stable  Complications: No apparent anesthesia complications

## 2014-10-06 NOTE — Anesthesia Postprocedure Evaluation (Signed)
  Anesthesia Post-op Note  Patient: Abigail [name]  Procedure(s) Performed: Procedure(s) with comments: CATARACT EXTRACTION PHACO AND INTRAOCULAR LENS PLACEMENT LEFT EYE (Left) - CDE 7.35  Patient Location: Short Stay  Anesthesia Type:MAC  Level of Consciousness: awake, alert , oriented and patient cooperative  Airway and Oxygen Therapy: Patient Spontanous Breathing  Post-op Pain: none  Post-op Assessment: Post-op Vital signs reviewed, Patient's Cardiovascular Status Stable, Respiratory Function Stable, Patent Airway, No signs of Nausea or vomiting and Pain level controlled  Post-op Vital Signs: Reviewed and stable  Last Vitals:  Filed Vitals:   10/06/14 0930  BP: 120/68  Pulse:   Temp:   Resp: 19    Complications: No apparent anesthesia complications

## 2014-10-06 NOTE — Discharge Instructions (Signed)

## 2014-10-06 NOTE — Anesthesia Preprocedure Evaluation (Signed)
Anesthesia Evaluation  Patient identified by MRN, date of birth, ID band Patient awake    Reviewed: Allergy & Precautions, H&P , NPO status , Patient's Chart, lab work & pertinent test results  Airway Mallampati: II  TM Distance: >3 FB     Dental  (+) Teeth Intact, Implants   Pulmonary Pneumonia: lung nodule., COPD breath sounds clear to auscultation        Cardiovascular hypertension, Pt. on medications Rhythm:Regular Rate:Normal     Neuro/Psych    GI/Hepatic PUD, GERD-  Medicated,  Endo/Other  diabetes, Type 2, Oral Hypoglycemic Agents  Renal/GU      Musculoskeletal  (+) Arthritis -,   Abdominal   Peds  Hematology   Anesthesia Other Findings   Reproductive/Obstetrics                             Anesthesia Physical Anesthesia Plan  ASA: III  Anesthesia Plan: MAC   Post-op Pain Management:    Induction: Intravenous  Airway Management Planned: Nasal Cannula  Additional Equipment:   Intra-op Plan:   Post-operative Plan:   Informed Consent: I have reviewed the patients History and Physical, chart, labs and discussed the procedure including the risks, benefits and alternatives for the proposed anesthesia with the patient or authorized representative who has indicated his/her understanding and acceptance.     Plan Discussed with:   Anesthesia Plan Comments:         Anesthesia Quick Evaluation  

## 2014-10-07 ENCOUNTER — Encounter (HOSPITAL_COMMUNITY): Payer: Self-pay | Admitting: Ophthalmology

## 2014-10-10 DIAGNOSIS — K219 Gastro-esophageal reflux disease without esophagitis: Secondary | ICD-10-CM | POA: Diagnosis not present

## 2014-10-10 DIAGNOSIS — J329 Chronic sinusitis, unspecified: Secondary | ICD-10-CM | POA: Diagnosis not present

## 2014-10-10 DIAGNOSIS — E119 Type 2 diabetes mellitus without complications: Secondary | ICD-10-CM | POA: Diagnosis not present

## 2014-10-10 DIAGNOSIS — R05 Cough: Secondary | ICD-10-CM | POA: Diagnosis not present

## 2014-10-24 DIAGNOSIS — H2511 Age-related nuclear cataract, right eye: Secondary | ICD-10-CM | POA: Diagnosis not present

## 2014-11-09 ENCOUNTER — Encounter (HOSPITAL_COMMUNITY)
Admission: RE | Admit: 2014-11-09 | Discharge: 2014-11-09 | Disposition: A | Discharge status: Home / Self Care | Payer: Medicare Other | Source: Ambulatory Visit | Attending: Ophthalmology | Admitting: Ophthalmology

## 2014-11-09 ENCOUNTER — Encounter (HOSPITAL_COMMUNITY): Payer: Self-pay

## 2014-11-10 MED ORDER — KETOROLAC TROMETHAMINE 0.5 % OP SOLN: 1.0000 [drp] | OPHTHALMIC | Status: DC

## 2014-11-11 MED ORDER — NEOMYCIN-POLYMYXIN-DEXAMETH 3.5-10000-0.1 OP SUSP
OPHTHALMIC | Status: AC
  Filled 2014-11-11: qty 5

## 2014-11-11 MED ORDER — TETRACAINE HCL 0.5 % OP SOLN
OPHTHALMIC | Status: AC
  Filled 2014-11-11: qty 2

## 2014-11-11 MED ORDER — CYCLOPENTOLATE-PHENYLEPHRINE OP SOLN OPTIME - NO CHARGE
OPHTHALMIC | Status: AC
  Filled 2014-11-11: qty 2

## 2014-11-11 MED ORDER — PHENYLEPHRINE HCL 2.5 % OP SOLN
OPHTHALMIC | Status: AC
Start: 2014-11-11 — End: 2014-11-11
  Filled 2014-11-11: qty 15

## 2014-11-14 ENCOUNTER — Ambulatory Visit (HOSPITAL_COMMUNITY)
Admission: RE | Admit: 2014-11-14 | Discharge: 2014-11-14 | Disposition: A | Discharge status: Home / Self Care | Payer: Medicare Other | Source: Ambulatory Visit | Attending: Ophthalmology | Admitting: Ophthalmology

## 2014-11-14 ENCOUNTER — Ambulatory Visit (HOSPITAL_COMMUNITY): Payer: Medicare Other | Admitting: Anesthesiology

## 2014-11-14 ENCOUNTER — Encounter (HOSPITAL_COMMUNITY)
Admission: RE | Disposition: A | Discharge status: Home / Self Care | Payer: Self-pay | Source: Ambulatory Visit | Attending: Ophthalmology

## 2014-11-14 ENCOUNTER — Encounter (HOSPITAL_COMMUNITY): Payer: Self-pay

## 2014-11-14 DIAGNOSIS — H2511 Age-related nuclear cataract, right eye: Secondary | ICD-10-CM | POA: Diagnosis not present

## 2014-11-14 DIAGNOSIS — H259 Unspecified age-related cataract: Secondary | ICD-10-CM | POA: Diagnosis not present

## 2014-11-14 DIAGNOSIS — H269 Unspecified cataract: Secondary | ICD-10-CM | POA: Diagnosis present

## 2014-11-14 HISTORY — PX: CATARACT EXTRACTION W/PHACO: SHX586

## 2014-11-14 LAB — GLUCOSE, CAPILLARY: GLUCOSE-CAPILLARY: 131 mg/dL — AB (ref 70–99)

## 2014-11-14 SURGERY — PHACOEMULSIFICATION, CATARACT, WITH IOL INSERTION
Anesthesia: Monitor Anesthesia Care | Site: Eye | Laterality: Right

## 2014-11-14 MED ORDER — EPINEPHRINE HCL 1 MG/ML IJ SOLN
INTRAMUSCULAR | Status: AC
  Filled 2014-11-14: qty 1

## 2014-11-14 MED ORDER — NEOMYCIN-POLYMYXIN-DEXAMETH 3.5-10000-0.1 OP SUSP
OPHTHALMIC | Status: DC | PRN
  Administered 2014-11-14: 2 [drp] via OPHTHALMIC

## 2014-11-14 MED ORDER — EPINEPHRINE HCL 1 MG/ML IJ SOLN
INTRAOCULAR | Status: DC | PRN
  Administered 2014-11-14: 11:00:00

## 2014-11-14 MED ORDER — LACTATED RINGERS IV SOLN
INTRAVENOUS | Status: DC | PRN
  Administered 2014-11-14: 10:00:00 via INTRAVENOUS

## 2014-11-14 MED ORDER — ONDANSETRON HCL 4 MG/2ML IJ SOLN: 4.0000 mg | Freq: Once | INTRAMUSCULAR | Status: DC | PRN

## 2014-11-14 MED ORDER — TETRACAINE HCL 0.5 % OP SOLN
1.0000 [drp] | OPHTHALMIC | Status: AC
  Administered 2014-11-14 (×3): 1 [drp] via OPHTHALMIC

## 2014-11-14 MED ORDER — MIDAZOLAM HCL 2 MG/2ML IJ SOLN
1.0000 mg | INTRAMUSCULAR | Status: DC | PRN
  Administered 2014-11-14: 2 mg via INTRAVENOUS

## 2014-11-14 MED ORDER — LIDOCAINE HCL 3.5 % OP GEL: 1.0000 "application " | Freq: Once | OPHTHALMIC | Status: DC

## 2014-11-14 MED ORDER — FENTANYL CITRATE 0.05 MG/ML IJ SOLN
INTRAMUSCULAR | Status: AC
  Filled 2014-11-14: qty 2

## 2014-11-14 MED ORDER — CYCLOPENTOLATE-PHENYLEPHRINE 0.2-1 % OP SOLN
1.0000 [drp] | OPHTHALMIC | Status: AC
  Administered 2014-11-14 (×3): 1 [drp] via OPHTHALMIC

## 2014-11-14 MED ORDER — FENTANYL CITRATE 0.05 MG/ML IJ SOLN
25.0000 ug | INTRAMUSCULAR | Status: DC | PRN
Start: 2014-11-14 — End: 2014-11-14

## 2014-11-14 MED ORDER — PROVISC 10 MG/ML IO SOLN
INTRAOCULAR | Status: DC | PRN
  Administered 2014-11-14: 0.85 mL via INTRAOCULAR

## 2014-11-14 MED ORDER — POVIDONE-IODINE 5 % OP SOLN
OPHTHALMIC | Status: DC | PRN
  Administered 2014-11-14: 1 via OPHTHALMIC

## 2014-11-14 MED ORDER — LACTATED RINGERS IV SOLN
INTRAVENOUS | Status: DC
  Administered 2014-11-14: 10:00:00 via INTRAVENOUS

## 2014-11-14 MED ORDER — FENTANYL CITRATE 0.05 MG/ML IJ SOLN
25.0000 ug | INTRAMUSCULAR | Status: AC
  Administered 2014-11-14 (×2): 25 ug via INTRAVENOUS

## 2014-11-14 MED ORDER — MIDAZOLAM HCL 2 MG/2ML IJ SOLN
INTRAMUSCULAR | Status: AC
  Filled 2014-11-14: qty 2

## 2014-11-14 MED ORDER — PHENYLEPHRINE HCL 2.5 % OP SOLN
1.0000 [drp] | OPHTHALMIC | Status: AC
  Administered 2014-11-14 (×3): 1 [drp] via OPHTHALMIC

## 2014-11-14 MED ORDER — LIDOCAINE HCL (PF) 1 % IJ SOLN
INTRAMUSCULAR | Status: DC | PRN
  Administered 2014-11-14: .4 mL

## 2014-11-14 MED ORDER — BSS IO SOLN
INTRAOCULAR | Status: DC | PRN
  Administered 2014-11-14: 15 mL via INTRAOCULAR

## 2014-11-14 MED ORDER — LIDOCAINE 3.5 % OP GEL OPTIME - NO CHARGE
OPHTHALMIC | Status: DC | PRN
  Administered 2014-11-14: 1 [drp] via OPHTHALMIC

## 2014-11-14 SURGICAL SUPPLY — 33 items
CAPSULAR TENSION RING-AMO (OPHTHALMIC RELATED) IMPLANT
CLOTH BEACON ORANGE TIMEOUT ST (SAFETY) ×2 IMPLANT
EYE SHIELD UNIVERSAL CLEAR (GAUZE/BANDAGES/DRESSINGS) ×2 IMPLANT
GLOVE BIO SURGEON STRL SZ 6.5 (GLOVE) IMPLANT
GLOVE BIOGEL PI IND STRL 6.5 (GLOVE) ×1 IMPLANT
GLOVE BIOGEL PI IND STRL 7.0 (GLOVE) IMPLANT
GLOVE BIOGEL PI IND STRL 7.5 (GLOVE) IMPLANT
GLOVE BIOGEL PI INDICATOR 6.5 (GLOVE) ×1
GLOVE BIOGEL PI INDICATOR 7.0 (GLOVE)
GLOVE BIOGEL PI INDICATOR 7.5 (GLOVE)
GLOVE ECLIPSE 6.5 STRL STRAW (GLOVE) IMPLANT
GLOVE ECLIPSE 7.0 STRL STRAW (GLOVE) IMPLANT
GLOVE ECLIPSE 7.5 STRL STRAW (GLOVE) IMPLANT
GLOVE EXAM NITRILE LRG STRL (GLOVE) ×2 IMPLANT
GLOVE EXAM NITRILE MD LF STRL (GLOVE) IMPLANT
GLOVE SKINSENSE NS SZ6.5 (GLOVE)
GLOVE SKINSENSE NS SZ7.0 (GLOVE)
GLOVE SKINSENSE STRL SZ6.5 (GLOVE) IMPLANT
GLOVE SKINSENSE STRL SZ7.0 (GLOVE) IMPLANT
KIT VITRECTOMY (OPHTHALMIC RELATED) IMPLANT
PAD ARMBOARD 7.5X6 YLW CONV (MISCELLANEOUS) ×2 IMPLANT
PROC W NO LENS (INTRAOCULAR LENS)
PROC W SPEC LENS (INTRAOCULAR LENS)
PROCESS W NO LENS (INTRAOCULAR LENS) IMPLANT
PROCESS W SPEC LENS (INTRAOCULAR LENS) IMPLANT
RETRACTOR IRIS SIGHTPATH (OPHTHALMIC RELATED) IMPLANT
RING MALYGIN (MISCELLANEOUS) IMPLANT
SIGHTPATH CAT PROC W REG LENS (Ophthalmic Related) ×2 IMPLANT
SYRINGE LUER LOK 1CC (MISCELLANEOUS) ×2 IMPLANT
TAPE SURG TRANSPARENT 2IN (GAUZE/BANDAGES/DRESSINGS) ×1 IMPLANT
TAPE TRANSPARENT 2IN (GAUZE/BANDAGES/DRESSINGS) ×1
VISCOELASTIC ADDITIONAL (OPHTHALMIC RELATED) IMPLANT
WATER STERILE IRR 250ML POUR (IV SOLUTION) ×2 IMPLANT

## 2014-11-14 NOTE — Anesthesia Preprocedure Evaluation (Signed)
Anesthesia Evaluation  Patient identified by MRN, date of birth, ID band Patient awake    Reviewed: Allergy & Precautions, H&P , NPO status , Patient's Chart, lab work & pertinent test results  Airway Mallampati: II  TM Distance: >3 FB     Dental  (+) Teeth Intact, Implants   Pulmonary Pneumonia: lung nodule., COPD breath sounds clear to auscultation        Cardiovascular hypertension, Pt. on medications Rhythm:Regular Rate:Normal     Neuro/Psych    GI/Hepatic PUD, GERD-  Medicated,  Endo/Other  diabetes, Type 2, Oral Hypoglycemic Agents  Renal/GU      Musculoskeletal  (+) Arthritis -,   Abdominal   Peds  Hematology   Anesthesia Other Findings   Reproductive/Obstetrics                             Anesthesia Physical Anesthesia Plan  ASA: III  Anesthesia Plan: MAC   Post-op Pain Management:    Induction: Intravenous  Airway Management Planned: Nasal Cannula  Additional Equipment:   Intra-op Plan:   Post-operative Plan:   Informed Consent: I have reviewed the patients History and Physical, chart, labs and discussed the procedure including the risks, benefits and alternatives for the proposed anesthesia with the patient or authorized representative who has indicated his/her understanding and acceptance.     Plan Discussed with:   Anesthesia Plan Comments:         Anesthesia Quick Evaluation

## 2014-11-14 NOTE — Op Note (Signed)
Date of Admission: 11/14/2014  Date of Surgery: 11/14/2014   Pre-Op Dx: Cataract Right Eye  Post-Op Dx: Senile Nuclear Cataract Right  Eye,  Dx Code H25.11  Surgeon: Tonny Branch, M.D.  Assistants: None  Anesthesia: Topical with MAC  Indications: Painless, progressive loss of vision with compromise of daily activities.  Surgery: Cataract Extraction with Intraocular lens Implant Right Eye  Discription: The patient had dilating drops and viscous lidocaine placed into the Right eye in the pre-op holding area. After transfer to the operating room, a time out was performed. The patient was then prepped and draped. Beginning with a 15 degree blade a paracentesis port was made at the surgeon's 2 o'clock position. The anterior chamber was then filled with 1% non-preserved lidocaine. This was followed by filling the anterior chamber with Provisc.  A 2.34mm keratome blade was used to make a clear corneal incision at the temporal limbus.  A bent cystatome needle was used to create a continuous tear capsulotomy. Hydrodissection was performed with balanced salt solution on a Fine canula. The lens nucleus was then removed using the phacoemulsification handpiece. Residual cortex was removed with the I&A handpiece. The anterior chamber and capsular bag were refilled with Provisc. A posterior chamber intraocular lens was placed into the capsular bag with it's injector. The implant was positioned with the Kuglan hook. The Provisc was then removed from the anterior chamber and capsular bag with the I&A handpiece. Stromal hydration of the main incision and paracentesis port was performed with BSS on a Fine canula. The wounds were tested for leak which was negative. The patient tolerated the procedure well. There were no operative complications. The patient was then transferred to the recovery room in stable condition.  Complications: None  Specimen: None  EBL: None  Prosthetic device: Hoya iSert 250, power 22.0 D,  SN [id number].

## 2014-11-14 NOTE — H&P (Signed)
I have reviewed the H&P, the patient was re-examined, and I have identified no interval changes in medical condition and plan of care since the history and physical of record  

## 2014-11-14 NOTE — Discharge Instructions (Signed)

## 2014-11-14 NOTE — Anesthesia Postprocedure Evaluation (Signed)
  Anesthesia Post-op Note  Patient: Abigail [name]  Procedure(s) Performed: Procedure(s) with comments: CATARACT EXTRACTION PHACO AND INTRAOCULAR LENS PLACEMENT RIGHT EYE (Right) - CDE:6.39  Patient Location: Short Stay  Anesthesia Type:MAC  Level of Consciousness: awake, alert  and oriented  Airway and Oxygen Therapy: Patient Spontanous Breathing  Post-op Pain: none  Post-op Assessment: Post-op Vital signs reviewed, Patient's Cardiovascular Status Stable, Respiratory Function Stable, Patent Airway and No signs of Nausea or vomiting  Post-op Vital Signs: Reviewed and stable  Last Vitals:  Filed Vitals:   11/14/14 1030  BP: 118/64  Pulse:   Temp:   Resp: 19    Complications: No apparent anesthesia complications

## 2014-11-14 NOTE — Transfer of Care (Signed)
Immediate Anesthesia Transfer of Care Note  Patient: Abigail [name]  Procedure(s) Performed: Procedure(s) with comments: CATARACT EXTRACTION PHACO AND INTRAOCULAR LENS PLACEMENT RIGHT EYE (Right) - CDE:6.39  Patient Location: Short Stay  Anesthesia Type:MAC  Level of Consciousness: awake  Airway & Oxygen Therapy: Patient Spontanous Breathing  Post-op Assessment: Report given to PACU RN  Post vital signs: Reviewed  Complications: No apparent anesthesia complications

## 2014-11-15 ENCOUNTER — Encounter (HOSPITAL_COMMUNITY): Payer: Self-pay | Admitting: Ophthalmology

## 2014-11-22 DIAGNOSIS — K589 Irritable bowel syndrome without diarrhea: Secondary | ICD-10-CM | POA: Diagnosis not present

## 2014-11-22 DIAGNOSIS — J329 Chronic sinusitis, unspecified: Secondary | ICD-10-CM | POA: Diagnosis not present

## 2014-11-22 DIAGNOSIS — H6593 Unspecified nonsuppurative otitis media, bilateral: Secondary | ICD-10-CM | POA: Diagnosis not present

## 2014-12-06 ENCOUNTER — Other Ambulatory Visit: Payer: Self-pay | Admitting: Internal Medicine

## 2014-12-23 DIAGNOSIS — L409 Psoriasis, unspecified: Secondary | ICD-10-CM | POA: Diagnosis not present

## 2014-12-23 DIAGNOSIS — Z79899 Other long term (current) drug therapy: Secondary | ICD-10-CM | POA: Diagnosis not present

## 2014-12-25 ENCOUNTER — Emergency Department: Payer: Self-pay | Admitting: Emergency Medicine

## 2014-12-25 DIAGNOSIS — I10 Essential (primary) hypertension: Secondary | ICD-10-CM | POA: Diagnosis not present

## 2014-12-25 DIAGNOSIS — M79675 Pain in left toe(s): Secondary | ICD-10-CM | POA: Diagnosis not present

## 2014-12-25 DIAGNOSIS — R200 Anesthesia of skin: Secondary | ICD-10-CM | POA: Diagnosis not present

## 2014-12-25 DIAGNOSIS — E119 Type 2 diabetes mellitus without complications: Secondary | ICD-10-CM | POA: Diagnosis not present

## 2014-12-25 DIAGNOSIS — Z79899 Other long term (current) drug therapy: Secondary | ICD-10-CM | POA: Diagnosis not present

## 2015-01-02 ENCOUNTER — Ambulatory Visit (INDEPENDENT_AMBULATORY_CARE_PROVIDER_SITE_OTHER): Payer: Medicare Other | Admitting: Podiatry

## 2015-01-02 ENCOUNTER — Ambulatory Visit (INDEPENDENT_AMBULATORY_CARE_PROVIDER_SITE_OTHER): Payer: Medicare Other

## 2015-01-02 DIAGNOSIS — E119 Type 2 diabetes mellitus without complications: Secondary | ICD-10-CM | POA: Diagnosis not present

## 2015-01-02 DIAGNOSIS — M7730 Calcaneal spur, unspecified foot: Secondary | ICD-10-CM

## 2015-01-02 NOTE — Progress Notes (Signed)
   Subjective:    Patient ID: Abigail [name], female    DOB: 1951-10-23, 64 y.o.   MRN: [id number]  HPI Comments: "I had a pain in this toe"  Patient c/o sharp, needle-like sensation, sometimes itching 1st toe left about 1 week ago. The pain started suddenly only lasting about 3 days. It has since went away. She went to the ER and they gave her Naprosyn thinking just muscle pain. She did indicate her sugars have not been under good control. Her last A1C is unknown, but her fasting glucose this AM was 142.      Review of Systems  Constitutional: Positive for diaphoresis.  HENT: Positive for ear pain and sinus pressure.   Respiratory: Positive for shortness of breath.   Gastrointestinal: Positive for abdominal pain, diarrhea and abdominal distention.  Endocrine: Positive for polyphagia.  Musculoskeletal: Positive for myalgias, arthralgias and gait problem.  Neurological: Positive for dizziness, tremors and headaches.  All other systems reviewed and are negative.      Objective:   Physical Exam: I have reviewed her past medical history medications allergies surgery social history and review of systems. Pulses are strongly palpable bilateral. Neurologic sensorium is slightly diminished per Semmes-Weinstein monofilament to the hallux left. Deep tendon reflexes intact bilateral muscle strength is 5 over 5 dorsiflexion plantar flexors and inverters everters all his musculatures intact. Orthopedic evaluation of his roots all joints distal to the angle full range of motion without crepitation. Cutaneous evaluation and straight supple well-hydrated cutis no erythema edema cellulitis drainage or odor. Radiographic evaluation does not demonstrate any type of osseus abnormalities.        Assessment & Plan:  Assessment: I do not think this is diabetic peripheral neuropathy however based on her history I am concerned that this very well may be a radiculopathy associated with a spinal impingement of her  left side which is resulting in her sciatica as well as her paresthesias in her hallux left.  Plan: Suggest she follow up with her back doctor.

## 2015-01-04 DIAGNOSIS — R51 Headache: Secondary | ICD-10-CM | POA: Diagnosis not present

## 2015-01-04 DIAGNOSIS — J329 Chronic sinusitis, unspecified: Secondary | ICD-10-CM | POA: Diagnosis not present

## 2015-01-31 ENCOUNTER — Other Ambulatory Visit: Payer: Self-pay | Admitting: Family Medicine

## 2015-02-03 DIAGNOSIS — M542 Cervicalgia: Secondary | ICD-10-CM | POA: Diagnosis not present

## 2015-02-03 DIAGNOSIS — M25512 Pain in left shoulder: Secondary | ICD-10-CM | POA: Diagnosis not present

## 2015-02-07 DIAGNOSIS — E559 Vitamin D deficiency, unspecified: Secondary | ICD-10-CM | POA: Diagnosis not present

## 2015-02-07 DIAGNOSIS — I10 Essential (primary) hypertension: Secondary | ICD-10-CM | POA: Diagnosis not present

## 2015-02-07 DIAGNOSIS — E119 Type 2 diabetes mellitus without complications: Secondary | ICD-10-CM | POA: Diagnosis not present

## 2015-02-07 DIAGNOSIS — K219 Gastro-esophageal reflux disease without esophagitis: Secondary | ICD-10-CM | POA: Diagnosis not present

## 2015-02-08 DIAGNOSIS — R51 Headache: Secondary | ICD-10-CM | POA: Diagnosis not present

## 2015-02-08 DIAGNOSIS — J320 Chronic maxillary sinusitis: Secondary | ICD-10-CM | POA: Diagnosis not present

## 2015-02-08 DIAGNOSIS — J301 Allergic rhinitis due to pollen: Secondary | ICD-10-CM | POA: Diagnosis not present

## 2015-02-24 DIAGNOSIS — M542 Cervicalgia: Secondary | ICD-10-CM | POA: Diagnosis not present

## 2015-02-24 DIAGNOSIS — M25512 Pain in left shoulder: Secondary | ICD-10-CM | POA: Diagnosis not present

## 2015-02-24 DIAGNOSIS — M25532 Pain in left wrist: Secondary | ICD-10-CM | POA: Diagnosis not present

## 2015-03-07 DIAGNOSIS — L570 Actinic keratosis: Secondary | ICD-10-CM | POA: Diagnosis not present

## 2015-03-07 DIAGNOSIS — L409 Psoriasis, unspecified: Secondary | ICD-10-CM | POA: Diagnosis not present

## 2015-04-27 ENCOUNTER — Encounter: Payer: Self-pay | Admitting: Internal Medicine

## 2015-05-09 ENCOUNTER — Other Ambulatory Visit: Payer: Self-pay | Admitting: Family Medicine

## 2015-05-23 DIAGNOSIS — M25512 Pain in left shoulder: Secondary | ICD-10-CM | POA: Diagnosis not present

## 2015-06-05 ENCOUNTER — Other Ambulatory Visit: Payer: Self-pay | Admitting: Physician Assistant

## 2015-06-05 NOTE — H&P (Signed)
This is a pleasant 64 year-old female who presents to our clinic today with continued pain to the left wrist.  Abigail [name] has struggled with de Quervain's tenosynovitis over the past several months.  We saw her back in late April of this past year where we proceeded with a Cortisone injection into the de Quervain's tendon sheath.  She notes moderate relief of symptoms, however this only lasted for a few weeks.  The pain has since returned and has been progressively worsening.  She notes an occasional numbness to the top of her hand.  Nothing into the fingers.  She has now started to have pain over the radial tunnel, as well as the lateral epicondyle.  He is also beginning to have not only pain with activity, but pain at rest and at night.   Past medical, social and family history reviewed in detail on the patient questionnaire and signed.  Review of systems: As detailed in HPI.  All others reviewed and are negative.   EXAMINATION: Well-developed, well-nourished female in no acute distress.  Alert and oriented x 3.  Examination of her left wrist reveals marked tenderness over the de Quervain's tendon sheath.  Moderate tenderness over the lateral epicondyle greater than the radial tunnel.  Decreased grip strength.  Pain with flexion and supination of the wrist.  She is neurovascularly intact distally.    IMPRESSION: De Quervain's tenosynovitis, left wrist.    PLAN: Abigail [name] has tried conservative intervention such as over the counter anti-inflammatories, as well as Corticosteroid injection at the de Quervain's tendon sheath.  Both of these have failed and we feel it is most appropriate to proceed with operative intervention.  We are going to proceed with a compartment release of the de Quervain's tendon sheath on the left wrist.  Risks, benefits and possible complications of surgery have been reviewed.  Rehab and recovery time discussed. All questions answered.  Paperwork complete.  We will see Abigail [name] at the time of  operative intervention.    Ninetta Lights, M.D.

## 2015-06-07 ENCOUNTER — Ambulatory Visit (INDEPENDENT_AMBULATORY_CARE_PROVIDER_SITE_OTHER): Payer: Medicare Other | Admitting: Podiatry

## 2015-06-07 ENCOUNTER — Encounter: Payer: Self-pay | Admitting: Podiatry

## 2015-06-07 VITALS — BP 147/77 | HR 75 | Resp 16

## 2015-06-07 DIAGNOSIS — G5790 Unspecified mononeuropathy of unspecified lower limb: Secondary | ICD-10-CM | POA: Diagnosis not present

## 2015-06-07 DIAGNOSIS — E1142 Type 2 diabetes mellitus with diabetic polyneuropathy: Secondary | ICD-10-CM

## 2015-06-07 MED ORDER — GABAPENTIN 100 MG PO CAPS: ORAL_CAPSULE | ORAL | Status: DC

## 2015-06-07 NOTE — Progress Notes (Signed)
She presents today for follow-up of pain to her toes. She states they're keeping her awake at night. She states that they seem to be getting worse from last visit she never did follow up with her back surgeon.  Objective: Vital signs are stable alert and oriented 3. Pulses are palpable. Much decrease in sensorium per Semmes-Weinstein monofilament in the tips of the toes to the metatarsophalangeal joints. Orthopedic evaluation demonstrates all joints is of ankle full range of motion without crepitation. 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  Plan: I started her on gabapentin 100 mg 1 by mouth daily at bedtime at nighttime for 1 week to progress to 1 in the morning and 1 at night and I will follow-up with her in 1 month.

## 2015-06-12 ENCOUNTER — Encounter (HOSPITAL_BASED_OUTPATIENT_CLINIC_OR_DEPARTMENT_OTHER): Payer: Self-pay | Admitting: *Deleted

## 2015-06-12 ENCOUNTER — Other Ambulatory Visit: Payer: Self-pay | Admitting: Physician Assistant

## 2015-06-12 NOTE — H&P (Signed)
This is a pleasant 64 year-old female who presents to our clinic today with continued pain to the left wrist.  Abigail [name] has struggled with de Quervain's tenosynovitis over the past several months.  We saw her back in late April of this past year where we proceeded with a Cortisone injection into the de Quervain's tendon sheath.  She notes moderate relief of symptoms, however this only lasted for a few weeks.  The pain has since returned and has been progressively worsening.  She notes an occasional numbness to the top of her hand.  Nothing into the fingers.  She has now started to have pain over the radial tunnel, as well as the lateral epicondyle.  He is also beginning to have not only pain with activity, but pain at rest and at night.   Past medical, social and family history reviewed in detail on the patient questionnaire and signed.  Review of systems: As detailed in HPI.  All others reviewed and are negative.   EXAMINATION: Well-developed, well-nourished female in no acute distress.  Alert and oriented x 3.  Examination of her left wrist reveals marked tenderness over the de Quervain's tendon sheath.  Moderate tenderness over the lateral epicondyle greater than the radial tunnel.  Decreased grip strength.  Pain with flexion and supination of the wrist.  She is neurovascularly intact distally.    IMPRESSION: De Quervain's tenosynovitis, left wrist.    PLAN: [name] has tried conservative intervention such as over the counter anti-inflammatories, as well as Corticosteroid injection at the de Quervain's tendon sheath.  Both of these have failed and we feel it is most appropriate to proceed with operative intervention.  We are going to proceed with a compartment release of the de Quervain's tendon sheath on the left wrist.  Risks, benefits and possible complications of surgery have been reviewed.  Rehab and recovery time discussed. All questions answered.  Paperwork complete.  We will see Abigail [name] at the time of  operative intervention.    Ninetta Lights, M.D.

## 2015-06-13 ENCOUNTER — Encounter (HOSPITAL_BASED_OUTPATIENT_CLINIC_OR_DEPARTMENT_OTHER)
Admission: RE | Admit: 2015-06-13 | Discharge: 2015-06-13 | Disposition: A | Discharge status: Home / Self Care | Payer: Medicare Other | Source: Ambulatory Visit | Attending: Orthopedic Surgery | Admitting: Orthopedic Surgery

## 2015-06-13 DIAGNOSIS — Z7982 Long term (current) use of aspirin: Secondary | ICD-10-CM | POA: Diagnosis not present

## 2015-06-13 DIAGNOSIS — K219 Gastro-esophageal reflux disease without esophagitis: Secondary | ICD-10-CM | POA: Diagnosis not present

## 2015-06-13 DIAGNOSIS — E119 Type 2 diabetes mellitus without complications: Secondary | ICD-10-CM | POA: Diagnosis not present

## 2015-06-13 DIAGNOSIS — I10 Essential (primary) hypertension: Secondary | ICD-10-CM | POA: Diagnosis not present

## 2015-06-13 DIAGNOSIS — M654 Radial styloid tenosynovitis [de Quervain]: Secondary | ICD-10-CM | POA: Diagnosis not present

## 2015-06-13 LAB — BASIC METABOLIC PANEL
Anion gap: 9 (ref 5–15)
BUN: 8 mg/dL (ref 6–20)
CALCIUM: 8.8 mg/dL — AB (ref 8.9–10.3)
CHLORIDE: 106 mmol/L (ref 101–111)
CO2: 25 mmol/L (ref 22–32)
CREATININE: 0.63 mg/dL (ref 0.44–1.00)
GFR calc Af Amer: >60 mL/min (ref >60)
Glucose, Bld: 171 mg/dL — ABNORMAL HIGH (ref 65–99)
POTASSIUM: 3.7 mmol/L (ref 3.5–5.1)
Sodium: 140 mmol/L (ref 135–145)

## 2015-06-15 ENCOUNTER — Ambulatory Visit (HOSPITAL_BASED_OUTPATIENT_CLINIC_OR_DEPARTMENT_OTHER)
Admission: RE | Admit: 2015-06-15 | Discharge: 2015-06-15 | Disposition: A | Discharge status: Home / Self Care | Payer: Medicare Other | Source: Ambulatory Visit | Attending: Orthopedic Surgery | Admitting: Orthopedic Surgery

## 2015-06-15 ENCOUNTER — Encounter (HOSPITAL_BASED_OUTPATIENT_CLINIC_OR_DEPARTMENT_OTHER)
Admission: RE | Disposition: A | Discharge status: Home / Self Care | Payer: Self-pay | Source: Ambulatory Visit | Attending: Orthopedic Surgery

## 2015-06-15 ENCOUNTER — Ambulatory Visit (HOSPITAL_BASED_OUTPATIENT_CLINIC_OR_DEPARTMENT_OTHER): Payer: Medicare Other | Admitting: Anesthesiology

## 2015-06-15 ENCOUNTER — Encounter (HOSPITAL_BASED_OUTPATIENT_CLINIC_OR_DEPARTMENT_OTHER): Payer: Self-pay | Admitting: Anesthesiology

## 2015-06-15 DIAGNOSIS — Z7982 Long term (current) use of aspirin: Secondary | ICD-10-CM | POA: Insufficient documentation

## 2015-06-15 DIAGNOSIS — M654 Radial styloid tenosynovitis [de Quervain]: Secondary | ICD-10-CM | POA: Diagnosis not present

## 2015-06-15 DIAGNOSIS — J449 Chronic obstructive pulmonary disease, unspecified: Secondary | ICD-10-CM | POA: Diagnosis not present

## 2015-06-15 DIAGNOSIS — E119 Type 2 diabetes mellitus without complications: Secondary | ICD-10-CM | POA: Diagnosis not present

## 2015-06-15 DIAGNOSIS — I10 Essential (primary) hypertension: Secondary | ICD-10-CM | POA: Insufficient documentation

## 2015-06-15 DIAGNOSIS — C801 Malignant (primary) neoplasm, unspecified: Secondary | ICD-10-CM | POA: Diagnosis not present

## 2015-06-15 DIAGNOSIS — K219 Gastro-esophageal reflux disease without esophagitis: Secondary | ICD-10-CM | POA: Diagnosis not present

## 2015-06-15 HISTORY — PX: DORSAL COMPARTMENT RELEASE: SHX5039

## 2015-06-15 LAB — GLUCOSE, CAPILLARY
GLUCOSE-CAPILLARY: 130 mg/dL — AB (ref 65–99)
Glucose-Capillary: 158 mg/dL — ABNORMAL HIGH (ref 65–99)

## 2015-06-15 LAB — POCT HEMOGLOBIN-HEMACUE: HEMOGLOBIN: 15.2 g/dL — AB (ref 12.0–15.0)

## 2015-06-15 SURGERY — RELEASE, FIRST DORSAL COMPARTMENT, HAND
Anesthesia: General | Site: Wrist | Laterality: Left

## 2015-06-15 MED ORDER — BUPIVACAINE HCL (PF) 0.25 % IJ SOLN
INTRAMUSCULAR | Status: AC
  Filled 2015-06-15: qty 30

## 2015-06-15 MED ORDER — ONDANSETRON HCL 4 MG PO TABS: 4.0000 mg | ORAL_TABLET | Freq: Three times a day (TID) | ORAL | Status: DC | PRN

## 2015-06-15 MED ORDER — MEPERIDINE HCL 25 MG/ML IJ SOLN: 6.2500 mg | INTRAMUSCULAR | Status: DC | PRN

## 2015-06-15 MED ORDER — KETOROLAC TROMETHAMINE 30 MG/ML IJ SOLN
INTRAMUSCULAR | Status: DC | PRN
  Administered 2015-06-15: 30 mg via INTRAVENOUS

## 2015-06-15 MED ORDER — SCOPOLAMINE 1 MG/3DAYS TD PT72: 1.0000 | MEDICATED_PATCH | Freq: Once | TRANSDERMAL | Status: DC | PRN

## 2015-06-15 MED ORDER — GLYCOPYRROLATE 0.2 MG/ML IJ SOLN: 0.2000 mg | Freq: Once | INTRAMUSCULAR | Status: DC | PRN

## 2015-06-15 MED ORDER — FENTANYL CITRATE (PF) 100 MCG/2ML IJ SOLN
INTRAMUSCULAR | Status: AC
  Filled 2015-06-15: qty 6

## 2015-06-15 MED ORDER — OXYCODONE-ACETAMINOPHEN 5-325 MG PO TABS: 1.0000 | ORAL_TABLET | ORAL | Status: DC | PRN

## 2015-06-15 MED ORDER — FENTANYL CITRATE (PF) 100 MCG/2ML IJ SOLN
50.0000 ug | INTRAMUSCULAR | Status: DC | PRN
  Administered 2015-06-15: 50 ug via INTRAVENOUS

## 2015-06-15 MED ORDER — BUPIVACAINE HCL (PF) 0.25 % IJ SOLN
INTRAMUSCULAR | Status: DC | PRN
Start: 2015-06-15 — End: 2015-06-15
  Administered 2015-06-15: 3 mL

## 2015-06-15 MED ORDER — ONDANSETRON HCL 4 MG/2ML IJ SOLN: 4.0000 mg | Freq: Once | INTRAMUSCULAR | Status: DC | PRN

## 2015-06-15 MED ORDER — LIDOCAINE HCL (CARDIAC) 20 MG/ML IV SOLN
INTRAVENOUS | Status: DC | PRN
  Administered 2015-06-15: 50 mg via INTRAVENOUS

## 2015-06-15 MED ORDER — MIDAZOLAM HCL 2 MG/2ML IJ SOLN
1.0000 mg | INTRAMUSCULAR | Status: DC | PRN
  Administered 2015-06-15: 1 mg via INTRAVENOUS

## 2015-06-15 MED ORDER — MIDAZOLAM HCL 2 MG/2ML IJ SOLN
INTRAMUSCULAR | Status: AC
  Filled 2015-06-15: qty 2

## 2015-06-15 MED ORDER — ONDANSETRON HCL 4 MG/2ML IJ SOLN
INTRAMUSCULAR | Status: DC | PRN
  Administered 2015-06-15: 4 mg via INTRAVENOUS

## 2015-06-15 MED ORDER — CHLORHEXIDINE GLUCONATE 4 % EX LIQD: 60.0000 mL | Freq: Once | CUTANEOUS | Status: DC

## 2015-06-15 MED ORDER — OXYCODONE-ACETAMINOPHEN 5-325 MG PO TABS
ORAL_TABLET | ORAL | Status: AC
  Filled 2015-06-15: qty 1

## 2015-06-15 MED ORDER — DEXAMETHASONE SODIUM PHOSPHATE 4 MG/ML IJ SOLN
INTRAMUSCULAR | Status: DC | PRN
  Administered 2015-06-15: 10 mg via INTRAVENOUS

## 2015-06-15 MED ORDER — OXYCODONE-ACETAMINOPHEN 5-325 MG PO TABS
1.0000 | ORAL_TABLET | Freq: Once | ORAL | Status: AC
  Administered 2015-06-15: 1 via ORAL

## 2015-06-15 MED ORDER — CEFAZOLIN SODIUM-DEXTROSE 2-3 GM-% IV SOLR
2.0000 g | INTRAVENOUS | Status: AC
  Administered 2015-06-15: 2 g via INTRAVENOUS

## 2015-06-15 MED ORDER — PROPOFOL 10 MG/ML IV BOLUS
INTRAVENOUS | Status: DC | PRN
Start: 2015-06-15 — End: 2015-06-15
  Administered 2015-06-15: 180 mg via INTRAVENOUS

## 2015-06-15 MED ORDER — LACTATED RINGERS IV SOLN
INTRAVENOUS | Status: DC
  Administered 2015-06-15: 10:00:00 via INTRAVENOUS

## 2015-06-15 MED ORDER — LACTATED RINGERS IV SOLN
INTRAVENOUS | Status: DC
Start: 2015-06-15 — End: 2015-06-15

## 2015-06-15 MED ORDER — CEFAZOLIN SODIUM-DEXTROSE 2-3 GM-% IV SOLR
INTRAVENOUS | Status: AC
  Filled 2015-06-15: qty 50

## 2015-06-15 MED ORDER — HYDROMORPHONE HCL 1 MG/ML IJ SOLN: 0.2500 mg | INTRAMUSCULAR | Status: DC | PRN

## 2015-06-15 SURGICAL SUPPLY — 40 items
BANDAGE ELASTIC 3 VELCRO ST LF (GAUZE/BANDAGES/DRESSINGS) ×2 IMPLANT
BLADE SURG 15 STRL LF DISP TIS (BLADE) ×1 IMPLANT
BLADE SURG 15 STRL SS (BLADE) ×2
BNDG CMPR 9X4 STRL LF SNTH (GAUZE/BANDAGES/DRESSINGS) ×1
BNDG COHESIVE 3X5 TAN STRL LF (GAUZE/BANDAGES/DRESSINGS) ×2 IMPLANT
BNDG ESMARK 4X9 LF (GAUZE/BANDAGES/DRESSINGS) ×2 IMPLANT
CORDS BIPOLAR (ELECTRODE) IMPLANT
COVER BACK TABLE 60X90IN (DRAPES) ×2 IMPLANT
COVER MAYO STAND STRL (DRAPES) ×2 IMPLANT
CUFF TOURNIQUET SINGLE 18IN (TOURNIQUET CUFF) ×2 IMPLANT
DRAPE EXTREMITY T 121X128X90 (DRAPE) ×2 IMPLANT
DRAPE SURG 17X23 STRL (DRAPES) ×2 IMPLANT
DURAPREP 26ML APPLICATOR (WOUND CARE) ×2 IMPLANT
GAUZE SPONGE 4X4 12PLY STRL (GAUZE/BANDAGES/DRESSINGS) ×2 IMPLANT
GAUZE XEROFORM 1X8 LF (GAUZE/BANDAGES/DRESSINGS) ×2 IMPLANT
GLOVE BIOGEL PI IND STRL 7.0 (GLOVE) ×3 IMPLANT
GLOVE BIOGEL PI INDICATOR 7.0 (GLOVE) ×3
GLOVE ECLIPSE 6.5 STRL STRAW (GLOVE) ×2 IMPLANT
GLOVE ECLIPSE 7.0 STRL STRAW (GLOVE) ×2 IMPLANT
GLOVE ORTHO TXT STRL SZ7.5 (GLOVE) IMPLANT
GLOVE SURG ORTHO 8.0 STRL STRW (GLOVE) ×2 IMPLANT
GOWN STRL REUS W/ TWL LRG LVL3 (GOWN DISPOSABLE) ×2 IMPLANT
GOWN STRL REUS W/ TWL XL LVL3 (GOWN DISPOSABLE) ×1 IMPLANT
GOWN STRL REUS W/TWL LRG LVL3 (GOWN DISPOSABLE) ×4
GOWN STRL REUS W/TWL XL LVL3 (GOWN DISPOSABLE) ×2
NEEDLE HYPO 25X1 1.5 SAFETY (NEEDLE) ×2 IMPLANT
NS IRRIG 1000ML POUR BTL (IV SOLUTION) ×2 IMPLANT
PACK BASIN DAY SURGERY FS (CUSTOM PROCEDURE TRAY) ×2 IMPLANT
PAD CAST 3X4 CTTN HI CHSV (CAST SUPPLIES) ×1 IMPLANT
PADDING CAST ABS 3INX4YD NS (CAST SUPPLIES) ×1
PADDING CAST ABS COTTON 3X4 (CAST SUPPLIES) ×1 IMPLANT
PADDING CAST COTTON 3X4 STRL (CAST SUPPLIES) ×2
SPLINT PLASTER CAST XFAST 3X15 (CAST SUPPLIES) ×10 IMPLANT
SPLINT PLASTER XTRA FASTSET 3X (CAST SUPPLIES) ×10
STOCKINETTE 4X48 STRL (DRAPES) ×2 IMPLANT
SUT ETHILON 3 0 PS 1 (SUTURE) ×2 IMPLANT
SYR BULB 3OZ (MISCELLANEOUS) ×2 IMPLANT
SYR CONTROL 10ML LL (SYRINGE) ×2 IMPLANT
TOWEL OR 17X24 6PK STRL BLUE (TOWEL DISPOSABLE) ×2 IMPLANT
UNDERPAD 30X30 (UNDERPADS AND DIAPERS) IMPLANT

## 2015-06-15 NOTE — H&P (View-Only) (Signed)
This is a pleasant 64 year-old female who presents to our clinic today with continued pain to the left wrist.  Abigail [name] has struggled with de Quervain's tenosynovitis over the past several months.  We saw her back in late April of this past year where we proceeded with a Cortisone injection into the de Quervain's tendon sheath.  She notes moderate relief of symptoms, however this only lasted for a few weeks.  The pain has since returned and has been progressively worsening.  She notes an occasional numbness to the top of her hand.  Nothing into the fingers.  She has now started to have pain over the radial tunnel, as well as the lateral epicondyle.  He is also beginning to have not only pain with activity, but pain at rest and at night.   Past medical, social and family history reviewed in detail on the patient questionnaire and signed.  Review of systems: As detailed in HPI.  All others reviewed and are negative.   EXAMINATION: Well-developed, well-nourished female in no acute distress.  Alert and oriented x 3.  Examination of her left wrist reveals marked tenderness over the de Quervain's tendon sheath.  Moderate tenderness over the lateral epicondyle greater than the radial tunnel.  Decreased grip strength.  Pain with flexion and supination of the wrist.  She is neurovascularly intact distally.    IMPRESSION: De Quervain's tenosynovitis, left wrist.    PLAN: Kyan has tried conservative intervention such as over the counter anti-inflammatories, as well as Corticosteroid injection at the de Quervain's tendon sheath.  Both of these have failed and we feel it is most appropriate to proceed with operative intervention.  We are going to proceed with a compartment release of the de Quervain's tendon sheath on the left wrist.  Risks, benefits and possible complications of surgery have been reviewed.  Rehab and recovery time discussed. All questions answered.  Paperwork complete.  We will see Abigail [name] at the time of  operative intervention.    Abigail [name], M.D.

## 2015-06-15 NOTE — Interval H&P Note (Signed)
History and Physical Interval Note:  06/15/2015 7:28 AM  Abigail [name]  has presented today for surgery, with the diagnosis of RADIAL STYLOID TENOSYNOVITIS, DE QUERVAIN  The various methods of treatment have been discussed with the patient and family. After consideration of risks, benefits and other options for treatment, the patient has consented to  Procedure(s): LEFT WRIST DEQUERVAINS (Left) as a surgical intervention .  The patient's history has been reviewed, patient examined, no change in status, stable for surgery.  I have reviewed the patient's chart and labs.  Questions were answered to the patient's satisfaction.     Ninetta Lights

## 2015-06-15 NOTE — Anesthesia Procedure Notes (Signed)
Procedure Name: LMA Insertion Performed by: Nelia Rogoff W Pre-anesthesia Checklist: Patient identified, Timeout performed, Emergency Drugs available, Suction available and Patient being monitored Patient Re-evaluated:Patient Re-evaluated prior to inductionOxygen Delivery Method: Circle system utilized Preoxygenation: Pre-oxygenation with 100% oxygen Intubation Type: IV induction Ventilation: Mask ventilation without difficulty LMA: LMA inserted LMA Size: 4.0 Number of attempts: 1 Placement Confirmation: positive ETCO2 and breath sounds checked- equal and bilateral Tube secured with: Tape Dental Injury: Teeth and Oropharynx as per pre-operative assessment      

## 2015-06-15 NOTE — Discharge Instructions (Signed)
°  Care After Refer to this sheet in the next few weeks. These discharge instructions provide you with general information on caring for yourself after you leave the hospital. Your caregiver may also give you specific instructions. Your treatment has been planned according to the most current medical practices available, but unavoidable complications sometimes occur. If you have any problems or questions after discharge, please call your caregiver. HOME INSTRUCTIONS You may resume a normal diet and activities as directed.  Do NOT get cast wet.  Do NOT remove dressing until you come back to the doctor Only take over-the-counter or prescription medicines for pain, discomfort, or fever as directed by your caregiver.  Eat a well-balanced diet.  Avoid lifting or driving until you are instructed otherwise.  Make an appointment to see your caregiver for stitches (suture) or staple removal as directed.   SEEK MEDICAL CARE IF: You have swelling of your calf or leg.  You develop shortness of breath or chest pain.  You have redness, swelling, or increasing pain in the wound.  There is pus or any unusual drainage coming from the surgical site.  You notice a bad smell coming from the surgical site or dressing.  The surgical site breaks open after sutures or staples have been removed.  There is persistent bleeding from the suture or staple line.  You are getting worse or are not improving.  You have any other questions or concerns.  SEEK IMMEDIATE MEDICAL CARE IF:  You have a fever.  You develop a rash.  You have difficulty breathing.  You develop any reaction or side effects to medicines given.  Your knee motion is decreasing rather than improving.  MAKE SURE YOU:  Understand these instructions.  Will watch your condition.  Will get help right away if you are not doing well or get worse.     Post Anesthesia Home Care Instructions  Activity: Get plenty of rest for the remainder of the day. A  responsible adult should stay with you for 24 hours following the procedure.  For the next 24 hours, DO NOT: -Drive a car -Paediatric nurse -Drink alcoholic beverages -Take any medication unless instructed by your physician -Make any legal decisions or sign important papers.  Meals: Start with liquid foods such as gelatin or soup. Progress to regular foods as tolerated. Avoid greasy, spicy, heavy foods. If nausea and/or vomiting occur, drink only clear liquids until the nausea and/or vomiting subsides. Call your physician if vomiting continues.  Special Instructions/Symptoms: Your throat may feel dry or sore from the anesthesia or the breathing tube placed in your throat during surgery. If this causes discomfort, gargle with warm salt water. The discomfort should disappear within 24 hours.  If you had a scopolamine patch placed behind your ear for the management of post- operative nausea and/or vomiting:  1. The medication in the patch is effective for 72 hours, after which it should be removed.  Wrap patch in a tissue and discard in the trash. Wash hands thoroughly with soap and water. 2. You may remove the patch earlier than 72 hours if you experience unpleasant side effects which may include dry mouth, dizziness or visual disturbances. 3. Avoid touching the patch. Wash your hands with soap and water after contact with the patch.

## 2015-06-15 NOTE — Anesthesia Preprocedure Evaluation (Signed)
Anesthesia Evaluation  Patient identified by MRN, date of birth, ID band Patient awake    Reviewed: Allergy & Precautions, NPO status , Patient's Chart, lab work & pertinent test results  Airway Mallampati: I  TM Distance: >3 FB Neck ROM: Full    Dental   Pulmonary    Pulmonary exam normal       Cardiovascular hypertension, Pt. on medications Normal cardiovascular exam    Neuro/Psych    GI/Hepatic GERD-  Medicated and Controlled,  Endo/Other  diabetes, Type 2, Oral Hypoglycemic Agents  Renal/GU      Musculoskeletal   Abdominal   Peds  Hematology   Anesthesia Other Findings   Reproductive/Obstetrics                             Anesthesia Physical Anesthesia Plan  ASA: III  Anesthesia Plan: General   Post-op Pain Management:    Induction: Intravenous  Airway Management Planned: LMA  Additional Equipment:   Intra-op Plan:   Post-operative Plan: Extubation in OR  Informed Consent: I have reviewed the patients History and Physical, chart, labs and discussed the procedure including the risks, benefits and alternatives for the proposed anesthesia with the patient or authorized representative who has indicated his/her understanding and acceptance.     Plan Discussed with: CRNA and Surgeon  Anesthesia Plan Comments:         Anesthesia Quick Evaluation

## 2015-06-15 NOTE — Transfer of Care (Signed)
Immediate Anesthesia Transfer of Care Note  Patient: Abigail [name]  Procedure(s) Performed: Procedure(s): LEFT WRIST DEQUERVAINS (Left)  Patient Location: PACU  Anesthesia Type:General  Level of Consciousness: awake, alert  and oriented  Airway & Oxygen Therapy: Patient Spontanous Breathing and Patient connected to face mask oxygen  Post-op Assessment: Report given to RN and Post -op Vital signs reviewed and stable  Post vital signs: Reviewed and stable  Last Vitals:  Filed Vitals:   06/15/15 0919  BP: 106/77  Pulse: 72  Temp: 37 C  Resp: 20    Complications: No apparent anesthesia complications

## 2015-06-15 NOTE — Anesthesia Postprocedure Evaluation (Signed)
Anesthesia Post Note  Patient: Abigail [name]  Procedure(s) Performed: Procedure(s) (LRB): LEFT WRIST DEQUERVAINS (Left)  Anesthesia type: general  Patient location: PACU  Post pain: Pain level controlled  Post assessment: Patient's Cardiovascular Status Stable  Last Vitals:  Filed Vitals:   06/15/15 1115  BP: 121/54  Pulse: 71  Temp: 36.7 C  Resp: 20    Post vital signs: Reviewed and stable  Level of consciousness: sedated  Complications: No apparent anesthesia complications

## 2015-06-16 ENCOUNTER — Encounter (HOSPITAL_BASED_OUTPATIENT_CLINIC_OR_DEPARTMENT_OTHER): Payer: Self-pay | Admitting: Orthopedic Surgery

## 2015-06-16 NOTE — Op Note (Signed)
NAME:  MAJESTY, STEHLIN NO.:  [id number]  MEDICAL RECORD NO.:  [id number]  LOCATION:                               FACILITY:  [facility]  PHYSICIAN:  Ninetta Lights, M.D. DATE OF BIRTH:  24-Feb-1951  DATE OF PROCEDURE:  06/15/2015 DATE OF DISCHARGE:  06/15/2015                              OPERATIVE REPORT   PREOPERATIVE DIAGNOSIS:  Tennis Must Quervain's tenosynovitis, left wrist.  POSTOPERATIVE DIAGNOSIS:  De Quervain's tenosynovitis, left wrist.  PROCEDURE:  Release of first dorsal compartment, left wrist.  SURGEON:  Ninetta Lights, M.D.  ASSISTANT:  Elmyra Ricks, PA.  ANESTHESIA:  General.  BLOOD LOSS:  Minimal.  SPECIMENS:  None.  CULTURES:  None.  COMPLICATIONS:  None.  DRESSINGS:  Soft compressive thumb spica splint.  TOURNIQUET TIME:  30 minutes.  DESCRIPTION OF PROCEDURE:  The patient was brought to the operating room, placed on the operating table in a supine position.  After an adequate anesthesia had been obtained, prepped and draped in usual sterile fashion.  Exsanguinated with elevation of Esmarch.  Tourniquet inflated to 250 mmHg.  A small transverse incision over the first dorsal compartment.  Superficial branch radial nerve identified and protected. Retinaculum incised well proximal and distal.  Three separate compartments identified, all released.  Once I confirmed good release throughout, wound was irrigated and closed with nylon.  Margins were injected with Marcaine.  Sterile compressive dressing, thumb spica splint applied.  Tourniquet deflated and removed.  Anesthesia reversed. Brought to the recovery room.  Tolerated the surgery well.  No complications.     Ninetta Lights, M.D.     DFM/MEDQ  D:  06/15/2015  T:  06/16/2015  Job:  [id number]

## 2015-06-23 DIAGNOSIS — M654 Radial styloid tenosynovitis [de Quervain]: Secondary | ICD-10-CM | POA: Diagnosis not present

## 2015-07-04 DIAGNOSIS — M654 Radial styloid tenosynovitis [de Quervain]: Secondary | ICD-10-CM | POA: Diagnosis not present

## 2015-07-05 ENCOUNTER — Encounter: Payer: Self-pay | Admitting: Podiatry

## 2015-07-05 ENCOUNTER — Ambulatory Visit (INDEPENDENT_AMBULATORY_CARE_PROVIDER_SITE_OTHER): Payer: Medicare Other | Admitting: Podiatry

## 2015-07-05 VITALS — BP 134/71 | HR 82 | Resp 16

## 2015-07-05 DIAGNOSIS — E1142 Type 2 diabetes mellitus with diabetic polyneuropathy: Secondary | ICD-10-CM | POA: Diagnosis not present

## 2015-07-05 MED ORDER — GABAPENTIN 300 MG PO CAPS: 300.0000 mg | ORAL_CAPSULE | Freq: Two times a day (BID) | ORAL | Status: DC

## 2015-07-05 NOTE — Progress Notes (Signed)
She presents today for follow-up of her diabetic peripheral neuropathy and possible radiculopathies. She states that after taking the Neurontin she says my toes do not burn quite as bad however I still have the tightness around the toes and concerned about. She states that she continues to take the Neurontin 100 mg capsules 1 capsule twice daily. She denies any complications with the medications.  Objective: Pulses are strongly palpable bilateral. Neurologic sensorium is unchanged from previous visits.  Assessment: Diabetic peripheral neuropathy.  Plan: Increased her gabapentin 300 mg morning and evening and will follow up with her in 1 month.

## 2015-07-26 DIAGNOSIS — R5383 Other fatigue: Secondary | ICD-10-CM | POA: Diagnosis not present

## 2015-07-26 DIAGNOSIS — E559 Vitamin D deficiency, unspecified: Secondary | ICD-10-CM | POA: Diagnosis not present

## 2015-07-26 DIAGNOSIS — E780 Pure hypercholesterolemia: Secondary | ICD-10-CM | POA: Diagnosis not present

## 2015-07-26 DIAGNOSIS — I10 Essential (primary) hypertension: Secondary | ICD-10-CM | POA: Diagnosis not present

## 2015-07-28 DIAGNOSIS — L409 Psoriasis, unspecified: Secondary | ICD-10-CM | POA: Diagnosis not present

## 2015-08-02 ENCOUNTER — Ambulatory Visit (INDEPENDENT_AMBULATORY_CARE_PROVIDER_SITE_OTHER): Payer: Medicare Other

## 2015-08-02 ENCOUNTER — Ambulatory Visit (INDEPENDENT_AMBULATORY_CARE_PROVIDER_SITE_OTHER): Payer: Medicare Other | Admitting: Podiatry

## 2015-08-02 ENCOUNTER — Encounter: Payer: Self-pay | Admitting: Podiatry

## 2015-08-02 VITALS — BP 144/72 | HR 95 | Resp 16

## 2015-08-02 DIAGNOSIS — S92302A Fracture of unspecified metatarsal bone(s), left foot, initial encounter for closed fracture: Secondary | ICD-10-CM

## 2015-08-02 DIAGNOSIS — M779 Enthesopathy, unspecified: Secondary | ICD-10-CM

## 2015-08-02 DIAGNOSIS — L600 Ingrowing nail: Secondary | ICD-10-CM | POA: Diagnosis not present

## 2015-08-02 DIAGNOSIS — E1142 Type 2 diabetes mellitus with diabetic polyneuropathy: Secondary | ICD-10-CM | POA: Diagnosis not present

## 2015-08-02 MED ORDER — GABAPENTIN 300 MG PO CAPS: ORAL_CAPSULE | ORAL | Status: DC

## 2015-08-02 NOTE — Progress Notes (Signed)
She presents today for follow-up of her gabapentin. She states that she is doing much better and still is only approximately 50% improved. She still takes the medication 300 mg twice daily. She states that it makes her a little tired and lazy. She's also complaining of a painful fifth metatarsal distally and relates no trauma. States this been swollen like this for the past couple of weeks.  Objective: Vital signs are stable alert and oriented 3. Pulses are strongly palpable. Neurologic sensorium is unchanged edema to the left foot not to the right foot overlying particularly the fifth metatarsal of the left foot. She has severe pain on palpation fifth metatarsal neck area of the left foot. Radiographs 3 views of the left foot demonstrates an osseously mature individual with what appears to be a stress fracture of the fifth metatarsal neck left. No displacement and no comminution.  Assessment: Diabetic peripheral neuropathy approximate 50% improved with 300 mg of gabapentin twice daily. Fracture nondisplaced stress fracture fifth metatarsal left foot.  Plan: We will increase her gabapentin to 600 mg twice a day. I also dispensed a Darco shoe and she will utilize that for another month I will follow-up with her in a month for set of x-rays and to discuss her medication.

## 2015-08-18 DIAGNOSIS — M654 Radial styloid tenosynovitis [de Quervain]: Secondary | ICD-10-CM | POA: Diagnosis not present

## 2015-09-06 ENCOUNTER — Ambulatory Visit (INDEPENDENT_AMBULATORY_CARE_PROVIDER_SITE_OTHER): Payer: Medicare Other | Admitting: Podiatry

## 2015-09-06 ENCOUNTER — Encounter: Payer: Self-pay | Admitting: Podiatry

## 2015-09-06 ENCOUNTER — Ambulatory Visit (INDEPENDENT_AMBULATORY_CARE_PROVIDER_SITE_OTHER): Payer: Medicare Other

## 2015-09-06 VITALS — BP 157/80 | HR 90 | Resp 18

## 2015-09-06 DIAGNOSIS — M79673 Pain in unspecified foot: Secondary | ICD-10-CM

## 2015-09-06 DIAGNOSIS — L600 Ingrowing nail: Secondary | ICD-10-CM

## 2015-09-06 DIAGNOSIS — E1142 Type 2 diabetes mellitus with diabetic polyneuropathy: Secondary | ICD-10-CM | POA: Diagnosis not present

## 2015-09-06 DIAGNOSIS — R52 Pain, unspecified: Secondary | ICD-10-CM

## 2015-09-06 DIAGNOSIS — S92302A Fracture of unspecified metatarsal bone(s), left foot, initial encounter for closed fracture: Secondary | ICD-10-CM | POA: Diagnosis not present

## 2015-09-06 MED ORDER — NEOMYCIN-POLYMYXIN-HC 3.5-10000-1 OT SOLN: OTIC | Status: DC

## 2015-09-06 NOTE — Patient Instructions (Signed)

## 2015-09-07 NOTE — Progress Notes (Signed)
She presents today for follow-up of her diabetic polyneuropathy as well as for chief complaint of a painful ingrown toenail second digit of the right foot. She states that she is approximately 80% improved by taking the gabapentin 600 mg twice daily. She states that she's had significant pain to the second digit of the right foot would like to have this toenail permanently removed she states her blood sugars have been good running 140 to 170 mg/dL. She denies changes in her past medical history medications or allergies.  Objective: Vital signs are stable she is alert and oriented 3. Pulses are strongly palpable. Neurologic sensorium is diminished per Semmes-Weinstein monofilament. Deep tendon reflexes are intact. Cutaneous evaluation demonstrates distal onychocryptosis second digit right foot which are rated now margins and painful on palpation.  Assessment diabetic polyneuropathy improving 600 mg of gabapentin twice daily. Ingrown nail second digit right foot.  Plan: Continue medication as he is at this point in time. We also wrote a prescription for Cortisporin Otic to be applied after removal of her toenail and after soaking twice daily. We removed her second toenail right foot permanently today with matrixectomy after local anesthesia was administered. She tolerated the procedure well. She was provided with both oral and written home-going instructions for the care and soaking of her toe I will follow-up with her in 1 week to make sure she is doing well.  Roselind Messier DPM

## 2015-09-13 ENCOUNTER — Ambulatory Visit (INDEPENDENT_AMBULATORY_CARE_PROVIDER_SITE_OTHER): Payer: Medicare Other | Admitting: Podiatry

## 2015-09-13 ENCOUNTER — Encounter: Payer: Self-pay | Admitting: Podiatry

## 2015-09-13 DIAGNOSIS — L600 Ingrowing nail: Secondary | ICD-10-CM

## 2015-09-14 NOTE — Progress Notes (Signed)
She presents today for follow-up of a total nail avulsion matrixectomy second digit right foot. She states is a little red but is not sore any longer. She continues to soak twice daily.  Objective: Vital signs are stable she is alert and oriented 3. Pulses are strongly palpable. Neurologic sensorium is intact per Semmes-Weinstein monofilament. Deep tendon reflexes are intact. Cutaneous evaluation second digit right foot demonstrates supple well-hydrated cutis see no signs of infection.  Assessment: Well-healing's toe second digit right foot  Plan: Continue to soak in Epsom salts and warm water. Watch for signs and symptoms of infection notify us if there are any I will follow up with her on an as-needed basis. I encouraged her soak this until it has completely resolved.

## 2015-10-04 ENCOUNTER — Ambulatory Visit (INDEPENDENT_AMBULATORY_CARE_PROVIDER_SITE_OTHER): Payer: Medicare Other | Admitting: Podiatry

## 2015-10-04 ENCOUNTER — Ambulatory Visit (INDEPENDENT_AMBULATORY_CARE_PROVIDER_SITE_OTHER): Payer: Medicare Other

## 2015-10-04 ENCOUNTER — Encounter: Payer: Self-pay | Admitting: Podiatry

## 2015-10-04 VITALS — BP 135/71 | HR 84 | Resp 16

## 2015-10-04 DIAGNOSIS — E1142 Type 2 diabetes mellitus with diabetic polyneuropathy: Secondary | ICD-10-CM | POA: Diagnosis not present

## 2015-10-04 DIAGNOSIS — S92302D Fracture of unspecified metatarsal bone(s), left foot, subsequent encounter for fracture with routine healing: Secondary | ICD-10-CM

## 2015-10-04 DIAGNOSIS — L600 Ingrowing nail: Secondary | ICD-10-CM

## 2015-10-04 MED ORDER — GABAPENTIN 300 MG PO CAPS: ORAL_CAPSULE | ORAL | Status: DC

## 2015-10-04 MED ORDER — AMOXICILLIN-POT CLAVULANATE 875-125 MG PO TABS: 1.0000 | ORAL_TABLET | Freq: Two times a day (BID) | ORAL | Status: DC

## 2015-10-04 MED ORDER — MUPIROCIN 2 % EX OINT: TOPICAL_OINTMENT | CUTANEOUS | Status: DC

## 2015-10-04 NOTE — Progress Notes (Signed)
She presents today for follow-up of a fifth metatarsal fracture left foot. She states that it seems to be doing so much better. She does present with her Darco shoe. She is also complaining of a continued painful second digit status post matrixectomy second toe right foot. She states that she still cannot wear regular shoe and is sore. She continues to soak at least daily and apply Cortisporin otic. She is also complaining of neuropathy and stating that the 200 mg of gabapentin twice daily is not helping. She does not recall what her A1c is currently.  Objective: Vital signs are stable she is alert and oriented 3 pulses are strongly palpable. She has no reproducible pain on palpation fifth metatarsal left foot. Radiographs demonstrate no fracture. She has erythema and edema to the second digit of the right foot consistent with mild paronychia/cellulitis status post matrixectomy second digit right foot. No purulence and no drainage noted.  Assessment: Diabetic peripheral neuropathy. Well healing fracture fifth met base left. Cellulitis paronychia status post matrixectomy second digit right foot.  Plan: Discussed etiology pathology conservative versus surgical therapies. Encouraged her to soak her right foot in essence also warm water twice daily. I also started her on Augmentin 875 mg 1 by mouth twice a day. Wrote prescription for Bactroban ointment to be applied twice daily after soaking. I also increased her gabapentin 300 mg 3 times daily. I will follow-up with her in 2 weeks to reevaluate her cellulitis.  Roselind Messier DPM

## 2015-10-18 ENCOUNTER — Telehealth: Payer: Self-pay | Admitting: *Deleted

## 2015-10-18 MED ORDER — FLUCONAZOLE 150 MG PO TABS: 150.0000 mg | ORAL_TABLET | Freq: Once | ORAL | Status: DC

## 2015-10-18 NOTE — Telephone Encounter (Signed)
Pt states she is going to get her antibiotic refilled and forgot to tell Dr. Milinda Pointer they gave her a yeast infection.  Dr. Milinda Pointer ordered Diflucan 150 mg #1 take as directed.  Orders to [facility] and left message for pt.

## 2015-10-25 ENCOUNTER — Ambulatory Visit: Payer: Medicare Other | Admitting: Podiatry

## 2015-11-22 DIAGNOSIS — M654 Radial styloid tenosynovitis [de Quervain]: Secondary | ICD-10-CM | POA: Diagnosis not present

## 2015-11-30 ENCOUNTER — Encounter: Payer: Self-pay | Admitting: Physician Assistant

## 2015-12-05 DIAGNOSIS — L409 Psoriasis, unspecified: Secondary | ICD-10-CM | POA: Diagnosis not present

## 2015-12-05 DIAGNOSIS — L570 Actinic keratosis: Secondary | ICD-10-CM | POA: Diagnosis not present

## 2015-12-05 DIAGNOSIS — Z79899 Other long term (current) drug therapy: Secondary | ICD-10-CM | POA: Diagnosis not present

## 2015-12-14 DIAGNOSIS — S83242A Other tear of medial meniscus, current injury, left knee, initial encounter: Secondary | ICD-10-CM | POA: Diagnosis not present

## 2015-12-20 ENCOUNTER — Encounter: Payer: Self-pay | Admitting: *Deleted

## 2015-12-26 ENCOUNTER — Encounter: Payer: Self-pay | Admitting: Physician Assistant

## 2015-12-26 ENCOUNTER — Ambulatory Visit (INDEPENDENT_AMBULATORY_CARE_PROVIDER_SITE_OTHER): Payer: Medicare Other | Admitting: Physician Assistant

## 2015-12-26 ENCOUNTER — Other Ambulatory Visit (INDEPENDENT_AMBULATORY_CARE_PROVIDER_SITE_OTHER): Payer: Medicare Other

## 2015-12-26 VITALS — BP 140/84 | HR 76 | Ht 62.0 in | Wt 203.4 lb

## 2015-12-26 DIAGNOSIS — R197 Diarrhea, unspecified: Secondary | ICD-10-CM

## 2015-12-26 DIAGNOSIS — K760 Fatty (change of) liver, not elsewhere classified: Secondary | ICD-10-CM

## 2015-12-26 DIAGNOSIS — K589 Irritable bowel syndrome without diarrhea: Secondary | ICD-10-CM | POA: Diagnosis not present

## 2015-12-26 LAB — CBC WITH DIFFERENTIAL/PLATELET
Basophils Absolute: 0.1 10*3/uL (ref 0.0–0.1)
Basophils Relative: 0.9 % (ref 0.0–3.0)
EOS PCT: 5.7 % — AB (ref 0.0–5.0)
Eosinophils Absolute: 0.4 10*3/uL (ref 0.0–0.7)
HCT: 42.5 % (ref 36.0–46.0)
Hemoglobin: 14.8 g/dL (ref 12.0–15.0)
Lymphocytes Relative: 34.5 % (ref 12.0–46.0)
Lymphs Abs: 2.5 10*3/uL (ref 0.7–4.0)
MCHC: 34.8 g/dL (ref 30.0–36.0)
MCV: 84.7 fl (ref 78.0–100.0)
Monocytes Absolute: 0.5 10*3/uL (ref 0.1–1.0)
Monocytes Relative: 6.7 % (ref 3.0–12.0)
Neutro Abs: 3.8 10*3/uL (ref 1.4–7.7)
Neutrophils Relative %: 52.2 % (ref 43.0–77.0)
Platelets: 171 10*3/uL (ref 150.0–400.0)
RBC: 5.03 Mil/uL (ref 3.87–5.11)
RDW: 13.1 % (ref 11.5–15.5)
WBC: 7.3 10*3/uL (ref 4.0–10.5)

## 2015-12-26 LAB — COMPREHENSIVE METABOLIC PANEL
ALT: 37 U/L — ABNORMAL HIGH (ref 0–35)
AST: 40 U/L — ABNORMAL HIGH (ref 0–37)
Albumin: 4.4 g/dL (ref 3.5–5.2)
Alkaline Phosphatase: 79 U/L (ref 39–117)
BUN: 15 mg/dL (ref 6–23)
CHLORIDE: 106 meq/L (ref 96–112)
CO2: 26 mEq/L (ref 19–32)
Calcium: 9.2 mg/dL (ref 8.4–10.5)
Creatinine, Ser: 0.56 mg/dL (ref 0.40–1.20)
GFR: 114.93 mL/min (ref >60.00)
Glucose, Bld: 141 mg/dL — ABNORMAL HIGH (ref 70–99)
Potassium: 4 mEq/L (ref 3.5–5.1)
SODIUM: 141 meq/L (ref 135–145)
Total Bilirubin: 0.9 mg/dL (ref 0.2–1.2)
Total Protein: 6.9 g/dL (ref 6.0–8.3)

## 2015-12-26 LAB — C-REACTIVE PROTEIN: CRP: 0.4 mg/dL — ABNORMAL LOW (ref 0.5–20.0)

## 2015-12-26 MED ORDER — HYOSCYAMINE SULFATE 0.125 MG SL SUBL: 0.1250 mg | SUBLINGUAL_TABLET | Freq: Four times a day (QID) | SUBLINGUAL | Status: DC | PRN

## 2015-12-26 NOTE — Progress Notes (Signed)
Patient ID: Abigail [name], female   DOB: 08-20-1951, 65 y.o.   MRN: [id number]   Subjective:    Patient ID: Abigail [name], female    DOB: 1951/08/02, 65 y.o.   MRN: [id number]  HPI  Abigail [name] is a pleasant 65 year old white female previously known to Dr. Delfin Edis who was last seen in our office in 2013. She has history of GERD and IBS and previously documented fatty liver. Other medical problems include adult-onset diabetes mellitus with polyneuropathy, COPD, psoriasis for which she is on Humira, and hypertension/hyperlipidemia. Patient had undergone an EGD in 2013 with finding of mild gastritis biopsies were done to rule out Barrett's and these were negative. Biopsies of the stomach showed severe chronic active gastritis consistent with findings of H. pylori however no H. pylori organisms seen. Colonoscopy last done in August 2009 with a lipoma in the cecum and a 3 mm sigmoid colon polyp which was removed and found to be hyperplastic. She was recommended for 10 year interval follow-up. Patient says she has had a long history of IBS manifested with periodic episodes of diarrhea days and then resolved. She says she had an episode over the past couple of months with 6 weeks of continuous diarrhea with 4-5 bowel movements per day. This was associated with some abdominal cramping. No fever or chills nausea vomiting etc. She says last week after she made this appointment the diarrhea resolved. She is back to having normal bowel movements at this point. Is complaining of abdominal bloating and some urgency. She had taken a course of antibiotics in early December for an infected toenail. She has not started on any other new medications or had any changes in medication dosages. She says she is not taking Prevacid on a regular basis anymore, she weaned herself off and has been doing fine. She says she will take it occasionally if she has heartburn but otherwise is not requiring it regularly. She has no complaints of  dysphagia or odynophagia.  Review of Systems Pertinent positive and negative review of systems were noted in the above HPI section.  All other review of systems was otherwise negative.  Outpatient Encounter Prescriptions as of 12/26/2015  Medication Sig  . Adalimumab (HUMIRA) 40 MG/0.8ML PSKT Inject 40 mg into the skin every 14 (fourteen) days.  Marland Kitchen aspirin 81 MG tablet Take 81 mg by mouth daily.  . fluticasone (FLONASE) 50 MCG/ACT nasal spray 2 sprays daily.  Marland Kitchen gabapentin (NEURONTIN) 300 MG capsule Take two tablets by mouth three times daily.  Marland Kitchen glucose blood (FREESTYLE LITE) test strip Test blood sugar daily. Dx code: 790.29  . glucose monitoring kit (FREESTYLE) monitoring kit 1 each by Does not apply route as needed for other. Dispense one glucometer of choice, testing strips/lancets for once per day glucose checks.  QS for 1 month, 11 refills.  Marland Kitchen ibuprofen (ADVIL,MOTRIN) 200 MG tablet Take 600 mg by mouth daily as needed for headache or moderate pain.   Marland Kitchen lisinopril-hydrochlorothiazide (PRINZIDE,ZESTORETIC) 10-12.5 MG per tablet Take 1 tablet by mouth daily.    . metFORMIN (GLUCOPHAGE) 500 MG tablet Take 1 tablet (500 mg total) by mouth daily with breakfast. PATIENT NEEDS OFFICE VISIT FOR ADDITIONAL REFILLS  . mupirocin ointment (BACTROBAN) 2 % Apply to wound twice a day.  . hyoscyamine (LEVSIN/SL) 0.125 MG SL tablet Place 1 tablet (0.125 mg total) under the tongue every 6 (six) hours as needed.  . [DISCONTINUED] amoxicillin-clavulanate (AUGMENTIN) 875-125 MG tablet Take 1 tablet by mouth 2 (two)  times daily. (Patient not taking: Reported on 12/26/2015)  . [DISCONTINUED] cetirizine (ZYRTEC) 10 MG tablet Take 10 mg by mouth daily. Reported on 12/26/2015  . [DISCONTINUED] ezetimibe (ZETIA) 10 MG tablet Take 10 mg by mouth daily. Reported on 12/26/2015  . [DISCONTINUED] fenofibrate (TRICOR) 145 MG tablet Take 145 mg by mouth daily. Reported on 12/26/2015  . [DISCONTINUED] fluconazole (DIFLUCAN) 150 MG  tablet Take 1 tablet (150 mg total) by mouth once. (Patient not taking: Reported on 12/26/2015)  . [DISCONTINUED] gabapentin (NEURONTIN) 100 MG capsule Take 1 tablet at bedtime x 1 week, then take 1 twice daily (morning and night) thereafter. (Patient not taking: Reported on 12/26/2015)  . [DISCONTINUED] gabapentin (NEURONTIN) 300 MG capsule Take 1 capsule (300 mg total) by mouth 2 (two) times daily. (Patient not taking: Reported on 12/26/2015)  . [DISCONTINUED] gabapentin (NEURONTIN) 300 MG capsule Take two tablets by mouth twice daily (morning and bed time). (Patient not taking: Reported on 12/26/2015)  . [DISCONTINUED] neomycin-polymyxin-hydrocortisone (CORTISPORIN) otic solution Apply one to two drops to toe after soaking twice daily. (Patient not taking: Reported on 12/26/2015)  . [DISCONTINUED] omeprazole (PRILOSEC) 40 MG capsule Take 40 mg by mouth daily. Reported on 12/26/2015  . [DISCONTINUED] ondansetron (ZOFRAN) 4 MG tablet Take 1 tablet (4 mg total) by mouth every 8 (eight) hours as needed for nausea or vomiting. (Patient not taking: Reported on 12/26/2015)  . [DISCONTINUED] oxyCODONE-acetaminophen (ROXICET) 5-325 MG per tablet Take 1-2 tablets by mouth every 4 (four) hours as needed. (Patient not taking: Reported on 12/26/2015)   No facility-administered encounter medications on file as of 12/26/2015.   No Known Allergies Patient Active Problem List   Diagnosis Date Noted  . 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 Pulmonary nodule 05/21/2013  . Chest pain 05/20/2013  . IRRITABLE BOWEL SYNDROME 05/18/2008  . HYPERLIPIDEMIA 05/17/2008  . HYPERTENSION 05/17/2008  . HEMORRHOIDS 05/17/2008  . GERD 05/17/2008  . FATTY LIVER DISEASE 05/17/2008  . PSORIASIS 05/17/2008  . ARTHRITIS 05/17/2008  . DIARRHEA 05/17/2008  . STRESS INCONTINENCE 05/17/2008   Social History   Social History  . Marital Status: Married    Spouse Name: N/A  . Number of Children: 2  . Years of Education: N/A    Occupational History  . retired    Social History Main Topics  . Smoking status: Never Smoker   . Smokeless tobacco: Never Used  . Alcohol Use: No  . Drug Use: No  . Sexual Activity: Yes    Birth Control/ Protection: None   Other Topics Concern  . Not on file   Social History Narrative    Ms. Grizzell's family history includes Breast cancer in her mother; Cancer in her mother; Colon polyps in her maternal aunt; Heart disease in her maternal grandfather.      Objective:    Filed Vitals:   12/26/15 0924  BP: 140/84  Pulse: 76    Physical Exam  well-developed older white female in no acute distress, pleasant blood pressure 140/84 pulse 76 height 5 foot 2 weight 203. HEENT; nontraumatic normal cephalic EOMI PERRLA sclera anicteric, Cardiovascular ;regular rate and rhythm with S1-S2 no murmur or gallop, Pulmonary; clear bilaterally, Abdomen; large soft nontender nondistended bowel sounds are active there is no palpable mass or hepatosplenomegaly, Rectal ;exam not done, Ext; no clubbing cyanosis or edema skin warm and dry, Neuropsych ;mood and affect appropriate     Assessment & Plan:   #1 65 yo female with IBS -D with recent prolonged episode of  diarrhea lasting 6 weeks and now resolved -suspect antibiotic related #2 colon neo[lasia surveillance- last colon 06/2008- one hyperplastic poly and lipoma of cecum- due for f/u 2019 #3 GERD  #4 psoriasis- on Humira #5 COPD #6 AODM  Plan; start Align one daily  Levsin sl q 6 hours prn  Plan follow up colonoscopy 2019 with Dr. Silverio Decamp  Pt will call for problems with recurrent diarrhea CBC,CMET   Iyah Laguna S Henry Demeritt PA-C 12/26/2015   Cc: Delia Chimes, NP

## 2015-12-26 NOTE — Patient Instructions (Signed)
Please go to the basement level to have your labs drawn.  Start Align probiotic, Take 1 tablet by mouth daily. We have given you a coupon.   We sent a prescription for Levsin SL. Take as directed.  [place].   You will be established here with Dr. Silverio Decamp.  Your next recall colonoscopy should be in 2019.

## 2015-12-27 ENCOUNTER — Other Ambulatory Visit: Payer: Medicare Other

## 2015-12-27 ENCOUNTER — Encounter: Payer: Self-pay | Admitting: Physician Assistant

## 2015-12-27 ENCOUNTER — Other Ambulatory Visit: Payer: Self-pay

## 2015-12-27 DIAGNOSIS — K7689 Other specified diseases of liver: Secondary | ICD-10-CM | POA: Diagnosis not present

## 2015-12-27 DIAGNOSIS — R7989 Other specified abnormal findings of blood chemistry: Secondary | ICD-10-CM | POA: Diagnosis not present

## 2015-12-27 DIAGNOSIS — K760 Fatty (change of) liver, not elsewhere classified: Secondary | ICD-10-CM | POA: Diagnosis not present

## 2015-12-27 DIAGNOSIS — R109 Unspecified abdominal pain: Secondary | ICD-10-CM

## 2015-12-27 DIAGNOSIS — R945 Abnormal results of liver function studies: Secondary | ICD-10-CM

## 2015-12-27 DIAGNOSIS — R197 Diarrhea, unspecified: Secondary | ICD-10-CM

## 2015-12-27 NOTE — Progress Notes (Signed)
Reviewed and agree with documentation and assessment and plan. K. Veena Kauri Garson , MD   

## 2015-12-28 LAB — HEPATITIS PANEL, ACUTE
Hep A IgM: NEGATIVE
Hep B C IgM: NEGATIVE
Hepatitis B Surface Ag: NEGATIVE

## 2015-12-28 LAB — ANA: ANA: NEGATIVE

## 2015-12-28 LAB — ANTI-SMOOTH MUSCLE ANTIBODY, IGG: SMOOTH MUSCLE AB: 4 U (ref 0–19)

## 2016-01-01 ENCOUNTER — Telehealth: Payer: Self-pay | Admitting: Physician Assistant

## 2016-01-10 DIAGNOSIS — S83242D Other tear of medial meniscus, current injury, left knee, subsequent encounter: Secondary | ICD-10-CM | POA: Diagnosis not present

## 2016-02-07 ENCOUNTER — Ambulatory Visit: Payer: Medicare Other | Admitting: Podiatry

## 2016-03-13 ENCOUNTER — Ambulatory Visit: Payer: Medicare Other | Admitting: Podiatry

## 2016-03-18 ENCOUNTER — Other Ambulatory Visit: Payer: Self-pay | Admitting: Podiatry

## 2016-03-18 MED ORDER — GABAPENTIN 300 MG PO CAPS: ORAL_CAPSULE | ORAL | Status: DC

## 2016-04-03 ENCOUNTER — Ambulatory Visit (INDEPENDENT_AMBULATORY_CARE_PROVIDER_SITE_OTHER): Payer: Medicare Other | Admitting: Podiatry

## 2016-04-03 ENCOUNTER — Encounter: Payer: Self-pay | Admitting: Podiatry

## 2016-04-03 DIAGNOSIS — E1142 Type 2 diabetes mellitus with diabetic polyneuropathy: Secondary | ICD-10-CM

## 2016-04-03 DIAGNOSIS — S40269A Insect bite (nonvenomous) of unspecified shoulder, initial encounter: Secondary | ICD-10-CM | POA: Diagnosis not present

## 2016-04-03 DIAGNOSIS — L03011 Cellulitis of right finger: Secondary | ICD-10-CM

## 2016-04-03 MED ORDER — GABAPENTIN 300 MG PO CAPS: ORAL_CAPSULE | ORAL | Status: DC

## 2016-04-03 NOTE — Progress Notes (Signed)
She presents today for follow-up of her diabetic peripheral neuropathy. She states the toe is doing great, we performed a matrixectomy to the second digit of the right foot. She states that her neuropathy is still bothering her. She has been taking 3 300 mg Neurontin tablets twice daily rather than to 300 mg Neurontin tablets 3 times daily. She states that she really has not noticed any difference taking a higher dose of medication.  Objective: Vital signs are stable she is alert and oriented 3. Pulses are palpable. No change in neurologic status. Cutaneous evaluation demonstrates that the toenail and toe second digit right foot appears to be healing uneventfully no sign of infection.  Assessment: Diabetic peripheral neuropathy bilateral not well controlled.  Plan: I started her on 900 mg of gabapentin 3 times a day. Follow up with her in 6 weeks for a check.

## 2016-05-22 ENCOUNTER — Ambulatory Visit: Payer: Medicare Other | Admitting: Podiatry

## 2016-05-28 ENCOUNTER — Other Ambulatory Visit: Payer: Self-pay | Admitting: Family Medicine

## 2016-06-05 ENCOUNTER — Encounter: Payer: Self-pay | Admitting: Podiatry

## 2016-06-05 ENCOUNTER — Ambulatory Visit (INDEPENDENT_AMBULATORY_CARE_PROVIDER_SITE_OTHER): Payer: Medicare Other | Admitting: Podiatry

## 2016-06-05 VITALS — BP 126/68 | HR 74 | Resp 12

## 2016-06-05 DIAGNOSIS — G5761 Lesion of plantar nerve, right lower limb: Secondary | ICD-10-CM | POA: Diagnosis not present

## 2016-06-05 DIAGNOSIS — E1142 Type 2 diabetes mellitus with diabetic polyneuropathy: Secondary | ICD-10-CM | POA: Diagnosis not present

## 2016-06-05 DIAGNOSIS — G5781 Other specified mononeuropathies of right lower limb: Secondary | ICD-10-CM

## 2016-06-06 NOTE — Progress Notes (Signed)
She presents today for follow-up of her diabetic peripheral neuropathy and states the majority of her pain is nocturnal but she still feels it during the day as well she also has times or she cannot sleep because of pain in the foot. States that the majority of the pain is in the right foot as opposed to the left. She does relate that the third and fourth toes are most painful.  Objective: Vital signs are stable alert and oriented 3. Pulses are palpable bilateral. She does have pain on palpation with a palpable Mulder's click to the third interdigital space of the right foot. Patient relates that this is the painful area.  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  Plan: I injected the neuroma today with Kenalog and local anesthetic. I recommended that we consider pain clinic in the future should this not alleviate her symptoms.

## 2016-07-10 ENCOUNTER — Ambulatory Visit: Payer: Medicare Other | Admitting: Podiatry

## 2016-07-24 ENCOUNTER — Encounter: Payer: Self-pay | Admitting: Podiatry

## 2016-07-24 ENCOUNTER — Ambulatory Visit (INDEPENDENT_AMBULATORY_CARE_PROVIDER_SITE_OTHER): Payer: Medicare Other | Admitting: Podiatry

## 2016-07-24 ENCOUNTER — Telehealth: Payer: Self-pay | Admitting: *Deleted

## 2016-07-24 DIAGNOSIS — G5781 Other specified mononeuropathies of right lower limb: Secondary | ICD-10-CM

## 2016-07-24 DIAGNOSIS — E1142 Type 2 diabetes mellitus with diabetic polyneuropathy: Secondary | ICD-10-CM | POA: Diagnosis not present

## 2016-07-24 DIAGNOSIS — G5761 Lesion of plantar nerve, right lower limb: Secondary | ICD-10-CM | POA: Diagnosis not present

## 2016-07-24 NOTE — Telephone Encounter (Addendum)
-----   Message from [place] sent at 07/24/2016  1:58 PM EDT ----- Regarding: Pain clinic referral  Orders are in! Referral to Dr. Primus Bravo in [place]. Thanks!! 07/26/2016-Faxed required referral form, pt clinicals and demographics.

## 2016-07-24 NOTE — Progress Notes (Signed)
She presents today for a follow-up of her neuropathy and her and her neuromas bilaterally. Currently she is taking 2700 mg of gabapentin daily.  Objective: Vital signs are stable alert and oriented 3. Pulses are palpable. He no longer has pain on palpation to the third interdigital space of the right foot. That she does have a palpable Mulder's click to the third interspace of the left foot. With pain.  Assessment: Pain in limb secondary to diabetic peripheral neuropathy as well as resolving neuroma third interspace right current neuroma third interspace left.  Plan: Reinjected the left neuroma today with her first dose of dehydrated alcohol follow-up with her in 1 month I did refer her to pain management.

## 2016-07-29 ENCOUNTER — Telehealth: Payer: Self-pay | Admitting: Podiatry

## 2016-07-29 ENCOUNTER — Telehealth: Payer: Self-pay | Admitting: *Deleted

## 2016-07-29 DIAGNOSIS — R52 Pain, unspecified: Secondary | ICD-10-CM

## 2016-07-29 NOTE — Telephone Encounter (Signed)
Blanch Media with [facility] called to say Dr. Primus Bravo is no longer with their practice. Patient can either see another doctor there or see Dr. Primus Bravo in [place]. His new Phone number is [phone]

## 2016-09-04 ENCOUNTER — Ambulatory Visit: Payer: Medicare Other | Admitting: Podiatry

## 2016-09-11 DIAGNOSIS — E119 Type 2 diabetes mellitus without complications: Secondary | ICD-10-CM | POA: Diagnosis not present

## 2016-09-11 DIAGNOSIS — E559 Vitamin D deficiency, unspecified: Secondary | ICD-10-CM | POA: Diagnosis not present

## 2016-09-11 DIAGNOSIS — I10 Essential (primary) hypertension: Secondary | ICD-10-CM | POA: Diagnosis not present

## 2016-09-11 DIAGNOSIS — K219 Gastro-esophageal reflux disease without esophagitis: Secondary | ICD-10-CM | POA: Diagnosis not present

## 2016-09-11 DIAGNOSIS — R5383 Other fatigue: Secondary | ICD-10-CM | POA: Diagnosis not present

## 2016-10-07 DIAGNOSIS — H26493 Other secondary cataract, bilateral: Secondary | ICD-10-CM | POA: Diagnosis not present

## 2016-10-07 DIAGNOSIS — E119 Type 2 diabetes mellitus without complications: Secondary | ICD-10-CM | POA: Diagnosis not present

## 2016-10-07 DIAGNOSIS — Z961 Presence of intraocular lens: Secondary | ICD-10-CM | POA: Diagnosis not present

## 2016-10-09 DIAGNOSIS — J019 Acute sinusitis, unspecified: Secondary | ICD-10-CM | POA: Diagnosis not present

## 2016-10-09 DIAGNOSIS — M7710 Lateral epicondylitis, unspecified elbow: Secondary | ICD-10-CM | POA: Diagnosis not present

## 2016-10-23 DIAGNOSIS — H6690 Otitis media, unspecified, unspecified ear: Secondary | ICD-10-CM | POA: Diagnosis not present

## 2016-11-20 ENCOUNTER — Ambulatory Visit: Payer: Self-pay | Admitting: Podiatry

## 2016-12-04 ENCOUNTER — Ambulatory Visit (INDEPENDENT_AMBULATORY_CARE_PROVIDER_SITE_OTHER): Payer: Medicare (Managed Care) | Admitting: Podiatry

## 2016-12-04 ENCOUNTER — Ambulatory Visit: Payer: Self-pay | Admitting: Podiatry

## 2016-12-04 ENCOUNTER — Telehealth: Payer: Self-pay | Admitting: Physician Assistant

## 2016-12-04 DIAGNOSIS — D3613 Benign neoplasm of peripheral nerves and autonomic nervous system of lower limb, including hip: Secondary | ICD-10-CM | POA: Diagnosis not present

## 2016-12-04 NOTE — Progress Notes (Signed)
She presents today for follow-up of her neuroma bilateral second and third interdigital spaces. She states that she's doing quite the same with burning and numbness and tingling in the bilateral foot. She states that she did not go to the pain clinic because she was nervous system what they would do.  Objective: Vital signs are stable alert and oriented 3. Pulses are palpable. Neurologic sensorium is diminished persons once the monofilament. She has palpable Mulder's click to the third and second interdigital spaces bilateral. Deep tendon reflexes are intact.  Assessment bilateral peripheral neuropathy. Neuroma second and third interdigital spaces bilateral.  Plan: Reinjected the second interdigital space today with another dose of dehydrated alcohol. I'll walk with her in 1 month. I did requests that she follow-up with pain management.

## 2016-12-04 NOTE — Telephone Encounter (Signed)
Discussed with Nicoletta Ba PA and unable to write a jury excuse for IBS.  Patient notified

## 2016-12-05 ENCOUNTER — Telehealth: Payer: Self-pay | Admitting: Podiatry

## 2016-12-05 DIAGNOSIS — G629 Polyneuropathy, unspecified: Secondary | ICD-10-CM

## 2016-12-05 DIAGNOSIS — D3610 Benign neoplasm of peripheral nerves and autonomic nervous system, unspecified: Secondary | ICD-10-CM

## 2016-12-05 NOTE — Telephone Encounter (Signed)
Pt called with the [facility] id # [id number].She said she was to call to give it to you guys for her visit yesterday

## 2016-12-05 NOTE — Telephone Encounter (Addendum)
-----   Message from [place], [facility] sent at 12/04/2016  1:38 PM EST ----- Regarding: Pain clinic Referral to Pain Clinic. We did this once before and she had an appt to see Dr. Primus Bravo but she never went. She needs to be set back up. Thanks!! 12/05/2016-Faxed required form, demographics and clinicals to Dr. Primus Bravo.

## 2016-12-06 DIAGNOSIS — Z79899 Other long term (current) drug therapy: Secondary | ICD-10-CM | POA: Diagnosis not present

## 2016-12-06 DIAGNOSIS — D229 Melanocytic nevi, unspecified: Secondary | ICD-10-CM | POA: Diagnosis not present

## 2016-12-06 DIAGNOSIS — M79645 Pain in left finger(s): Secondary | ICD-10-CM | POA: Diagnosis not present

## 2016-12-06 DIAGNOSIS — L570 Actinic keratosis: Secondary | ICD-10-CM | POA: Diagnosis not present

## 2016-12-06 DIAGNOSIS — L409 Psoriasis, unspecified: Secondary | ICD-10-CM | POA: Diagnosis not present

## 2016-12-11 ENCOUNTER — Other Ambulatory Visit: Payer: Self-pay | Admitting: Orthopedic Surgery

## 2016-12-11 ENCOUNTER — Ambulatory Visit
Admission: RE | Admit: 2016-12-11 | Discharge: 2016-12-11 | Disposition: A | Discharge status: Home / Self Care | Payer: Medicare Other | Source: Ambulatory Visit | Attending: Orthopedic Surgery | Admitting: Orthopedic Surgery

## 2016-12-11 DIAGNOSIS — M1712 Unilateral primary osteoarthritis, left knee: Secondary | ICD-10-CM | POA: Insufficient documentation

## 2016-12-11 DIAGNOSIS — S83242D Other tear of medial meniscus, current injury, left knee, subsequent encounter: Secondary | ICD-10-CM | POA: Diagnosis not present

## 2016-12-11 DIAGNOSIS — R52 Pain, unspecified: Secondary | ICD-10-CM

## 2016-12-11 DIAGNOSIS — M25562 Pain in left knee: Secondary | ICD-10-CM | POA: Diagnosis present

## 2017-01-01 ENCOUNTER — Ambulatory Visit: Payer: Medicare Other | Admitting: Podiatry

## 2017-01-14 ENCOUNTER — Emergency Department: Payer: Medicare Other

## 2017-01-14 ENCOUNTER — Encounter: Payer: Self-pay | Admitting: Emergency Medicine

## 2017-01-14 ENCOUNTER — Emergency Department
Admission: EM | Admit: 2017-01-14 | Discharge: 2017-01-14 | Disposition: A | Discharge status: Home / Self Care | Payer: Medicare Other | Attending: Emergency Medicine | Admitting: Emergency Medicine

## 2017-01-14 DIAGNOSIS — Z791 Long term (current) use of non-steroidal anti-inflammatories (NSAID): Secondary | ICD-10-CM | POA: Insufficient documentation

## 2017-01-14 DIAGNOSIS — Z79899 Other long term (current) drug therapy: Secondary | ICD-10-CM | POA: Insufficient documentation

## 2017-01-14 DIAGNOSIS — J449 Chronic obstructive pulmonary disease, unspecified: Secondary | ICD-10-CM | POA: Diagnosis not present

## 2017-01-14 DIAGNOSIS — E119 Type 2 diabetes mellitus without complications: Secondary | ICD-10-CM | POA: Insufficient documentation

## 2017-01-14 DIAGNOSIS — I10 Essential (primary) hypertension: Secondary | ICD-10-CM | POA: Diagnosis not present

## 2017-01-14 DIAGNOSIS — Z7984 Long term (current) use of oral hypoglycemic drugs: Secondary | ICD-10-CM | POA: Diagnosis not present

## 2017-01-14 DIAGNOSIS — Z7982 Long term (current) use of aspirin: Secondary | ICD-10-CM | POA: Insufficient documentation

## 2017-01-14 DIAGNOSIS — N202 Calculus of kidney with calculus of ureter: Secondary | ICD-10-CM | POA: Diagnosis not present

## 2017-01-14 DIAGNOSIS — N200 Calculus of kidney: Secondary | ICD-10-CM | POA: Insufficient documentation

## 2017-01-14 DIAGNOSIS — R109 Unspecified abdominal pain: Secondary | ICD-10-CM | POA: Diagnosis present

## 2017-01-14 LAB — BASIC METABOLIC PANEL
Anion gap: 8 (ref 5–15)
BUN: 16 mg/dL (ref 6–20)
CHLORIDE: 105 mmol/L (ref 101–111)
CO2: 27 mmol/L (ref 22–32)
Calcium: 9.1 mg/dL (ref 8.9–10.3)
Creatinine, Ser: 0.71 mg/dL (ref 0.44–1.00)
GFR calc non Af Amer: >60 mL/min (ref >60)
Glucose, Bld: 196 mg/dL — ABNORMAL HIGH (ref 65–99)
Potassium: 4 mmol/L (ref 3.5–5.1)
SODIUM: 140 mmol/L (ref 135–145)

## 2017-01-14 LAB — URINALYSIS, COMPLETE (UACMP) WITH MICROSCOPIC
Bacteria, UA: NONE SEEN
Bilirubin Urine: NEGATIVE
GLUCOSE, UA: NEGATIVE mg/dL
KETONES UR: NEGATIVE mg/dL
LEUKOCYTES UA: NEGATIVE
NITRITE: NEGATIVE
PROTEIN: 30 mg/dL — AB
Specific Gravity, Urine: 1.018 (ref 1.005–1.030)
pH: 5 (ref 5.0–8.0)

## 2017-01-14 LAB — CBC WITH DIFFERENTIAL/PLATELET
BASOS PCT: 0 %
Basophils Absolute: 0 10*3/uL (ref 0–0.1)
EOS ABS: 0.3 10*3/uL (ref 0–0.7)
Eosinophils Relative: 3 %
HCT: 41.2 % (ref 35.0–47.0)
HEMOGLOBIN: 14.4 g/dL (ref 12.0–16.0)
LYMPHS ABS: 2.7 10*3/uL (ref 1.0–3.6)
Lymphocytes Relative: 26 %
MCH: 30.4 pg (ref 26.0–34.0)
MCHC: 34.9 g/dL (ref 32.0–36.0)
MCV: 87.1 fL (ref 80.0–100.0)
Monocytes Absolute: 0.7 10*3/uL (ref 0.2–0.9)
Monocytes Relative: 7 %
NEUTROS ABS: 6.7 10*3/uL — AB (ref 1.4–6.5)
NEUTROS PCT: 64 %
Platelets: 194 10*3/uL (ref 150–440)
RBC: 4.73 MIL/uL (ref 3.80–5.20)
RDW: 12.8 % (ref 11.5–14.5)
WBC: 10.4 10*3/uL (ref 3.6–11.0)

## 2017-01-14 MED ORDER — KETOROLAC TROMETHAMINE 30 MG/ML IJ SOLN
10.0000 mg | Freq: Once | INTRAMUSCULAR | Status: AC
  Administered 2017-01-14: 9.9 mg via INTRAVENOUS
  Filled 2017-01-14: qty 1

## 2017-01-14 MED ORDER — ONDANSETRON HCL 4 MG/2ML IJ SOLN
4.0000 mg | Freq: Once | INTRAMUSCULAR | Status: AC
  Administered 2017-01-14: 4 mg via INTRAVENOUS
  Filled 2017-01-14: qty 2

## 2017-01-14 MED ORDER — OXYCODONE HCL 5 MG PO TABS
5.0000 mg | ORAL_TABLET | Freq: Once | ORAL | Status: AC
  Administered 2017-01-14: 5 mg via ORAL
  Filled 2017-01-14: qty 1

## 2017-01-14 MED ORDER — TAMSULOSIN HCL 0.4 MG PO CAPS
0.4000 mg | ORAL_CAPSULE | Freq: Once | ORAL | Status: AC
  Administered 2017-01-14: 0.4 mg via ORAL
  Filled 2017-01-14: qty 1

## 2017-01-14 MED ORDER — MORPHINE SULFATE (PF) 4 MG/ML IV SOLN
INTRAVENOUS | Status: AC
  Filled 2017-01-14: qty 1

## 2017-01-14 MED ORDER — MORPHINE SULFATE (PF) 4 MG/ML IV SOLN
4.0000 mg | Freq: Once | INTRAVENOUS | Status: AC
  Administered 2017-01-14: 4 mg via INTRAVENOUS
  Filled 2017-01-14: qty 1

## 2017-01-14 MED ORDER — ONDANSETRON HCL 4 MG PO TABS: 4.0000 mg | ORAL_TABLET | Freq: Three times a day (TID) | ORAL | 0 refills | Status: DC | PRN

## 2017-01-14 MED ORDER — TAMSULOSIN HCL 0.4 MG PO CAPS: 0.4000 mg | ORAL_CAPSULE | Freq: Every day | ORAL | 0 refills | Status: DC

## 2017-01-14 MED ORDER — ACETAMINOPHEN 500 MG PO TABS
1000.0000 mg | ORAL_TABLET | Freq: Once | ORAL | Status: AC
  Administered 2017-01-14: 1000 mg via ORAL
  Filled 2017-01-14: qty 2

## 2017-01-14 MED ORDER — MORPHINE SULFATE (PF) 4 MG/ML IV SOLN
4.0000 mg | Freq: Once | INTRAVENOUS | Status: AC
  Administered 2017-01-14: 4 mg via INTRAVENOUS

## 2017-01-14 MED ORDER — OXYCODONE-ACETAMINOPHEN 5-325 MG PO TABS: 1.0000 | ORAL_TABLET | Freq: Four times a day (QID) | ORAL | 0 refills | Status: DC | PRN

## 2017-01-14 MED ORDER — SODIUM CHLORIDE 0.9 % IV BOLUS (SEPSIS)
500.0000 mL | Freq: Once | INTRAVENOUS | Status: AC
Start: 2017-01-14 — End: 2017-01-14
  Administered 2017-01-14: 500 mL via INTRAVENOUS

## 2017-01-14 NOTE — ED Triage Notes (Signed)
Pt in with co left flank pain that started suddenly prior to coming in, did vomit twice. Pt has no hx of kidney stones.

## 2017-01-14 NOTE — Discharge Instructions (Signed)
You have been seen in the Emergency Department (ED)  Today and was diagnosed with kidney stones. While the stone is traveling through the ureter, which is the tube that carries urine from the kidney to the bladder, you will probably feel pain. The pain may be mild or very severe. You may also have some blood in your urine. As soon as the stone reaches the bladder, any intense pain should go away. If a stone is too large to pass on its own, you may need a medical procedure to help you pass the stone.  ° °As we have discussed, please drink plenty of fluids and use a urinary strainer to attempt to capture the stone.  Please make a follow up appointment with Urology in the next week by calling the number below and bring the stone with you. ° °Take ibuprofen 600mg every 6 hours for the pain. If the pain is not well controlled with ibuprofen you may take one percocet every 4 hours. Do not take tylenol while taking percocet. Please also take your prescribed flomax daily. Check with your doctor if you have a history of gastritis, stomach ulcers, renal failure or impaired kidney function as you may not be able to take ibuprofen/ motrin. Your doctor can give you a different prescription for pain control. ° °Follow-up with your doctor or return to the ER in 12-24 hours if your pain is not well controlled, if you develop pain or burning with urination, or if you develop a fever. Otherwise follow up in 3-5 days with your doctor. ° °When should you call for help?  °Call your doctor now or seek immediate medical care if:  °You cannot keep down fluids.  °Your pain gets worse.  °You have a fever or chills.  °You have new or worse pain in your back just below your rib cage (the flank area).  °You have new or more blood in your urine. °You have pain or burning with urination °You are unable to urinate °You have abdominal pain ° °Watch closely for changes in your health, and be sure to contact your doctor if:  °You do not get better as  expected ° °How can you care for yourself at home?  °Drink plenty of fluids, enough so that your urine is light yellow or clear like water. If you have kidney, heart, or liver disease and have to limit fluids, talk with your doctor before you increase the amount of fluids you drink.  °Take pain medicines exactly as directed. Call your doctor if you think you are having a problem with your medicine.  °If the doctor gave you a prescription medicine for pain, take it as prescribed.  °If you are not taking a prescription pain medicine, ask your doctor if you can take an over-the-counter medicine. Read and follow all instructions on the label. °Your doctor may ask you to strain your urine so that you can collect your kidney stone when it passes. You can use a kitchen strainer or a tea strainer to catch the stone. Store it in a plastic bag until you see your doctor again. ° °Preventing future kidney stones  °Some changes in your diet may help prevent kidney stones. Depending on the cause of your stones, your doctor may recommend that you:  °Drink plenty of fluids, enough so that your urine is light yellow or clear like water. If you have kidney, heart, or liver disease and have to limit fluids, talk with your doctor before you increase   the amount of fluids you drink.  °Limit coffee, tea, and alcohol. Also avoid grapefruit juice.  °Do not take more than the recommended daily dose of vitamins C and D.  °Avoid antacids such as Gaviscon, Maalox, Mylanta, or Tums.  °Limit the amount of salt (sodium) in your diet.  °Eat a balanced diet that is not too high in protein.  °Limit foods that are high in a substance called oxalate, which can cause kidney stones. These foods include dark green vegetables, rhubarb, chocolate, wheat bran, nuts, cranberries, and beans. ° ° ° ° °

## 2017-01-14 NOTE — ED Provider Notes (Signed)
[facility] Emergency Department Provider Note  ____________________________________________  Time seen: Approximately 7:44 PM  I have reviewed the triage vital signs and the nursing notes.   HISTORY  Chief Complaint Flank Pain   HPI Abigail [name] is a 66 y.o. female with a history of diabetes, COPD, hypertension, hyperlipidemia, and psoriasis who presents for evaluation of left flank pain. Patient reports sudden onset of severe sharp left flank pain at 5 PM radiating to her left lower quadrant and associated with nausea and 2 episodes of nonbloody nonbilious emesis. Patient reports that her pain is 10 out of 10. She has never had pain like this before. She has had chills but no fever, no abdominal pain, no diarrhea, no hematuria, no dysuria. Patient denies prior history of kidney stones. She has no chest pain or shortness of breath.  Past Medical History:  Diagnosis Date  . Arthritis   . Cancer ([place])   . COPD (chronic obstructive pulmonary disease) ([place])   . Diabetes mellitus ([place])    a. A1c 6.6 in 05/2013 indicating new dx.  . Fatty liver   . Female stress incontinence   . GERD (gastroesophageal reflux disease)   . Heart murmur   . Hemorrhoids   . Hyperlipidemia   . Hyperplastic colon polyp 06/07/2008  . Hypertension   . Hypertriglyceridemia   . IBS (irritable bowel syndrome)   . Lung nodule    a. 78m left lung nodule by CT 05/2013.  . Obesity   . Psoriasis   . Skin cancer     Patient Active Problem List   Diagnosis Date Noted  . 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 Pulmonary nodule 05/21/2013  . Chest pain 05/20/2013  . IRRITABLE BOWEL SYNDROME 05/18/2008  . HYPERLIPIDEMIA 05/17/2008  . HYPERTENSION 05/17/2008  . HEMORRHOIDS 05/17/2008  . GERD 05/17/2008  . FATTY LIVER DISEASE 05/17/2008  . PSORIASIS 05/17/2008  . ARTHRITIS 05/17/2008  . DIARRHEA 05/17/2008  . STRESS INCONTINENCE 05/17/2008    Past Surgical History:  Procedure  Laterality Date  . CATARACT EXTRACTION W/PHACO Left 10/06/2014   Procedure: CATARACT EXTRACTION PHACO AND INTRAOCULAR LENS PLACEMENT LEFT EYE;  Surgeon: KTonny Branch MD;  Location: [facility];  Service: Ophthalmology;  Laterality: Left;  CDE 7.35  . CATARACT EXTRACTION W/PHACO Right 11/14/2014   Procedure: CATARACT EXTRACTION PHACO AND INTRAOCULAR LENS PLACEMENT RIGHT EYE;  Surgeon: KTonny Branch MD;  Location: [facility];  Service: Ophthalmology;  Laterality: Right;  CDE:6.39  . DORSAL COMPARTMENT RELEASE Left 06/15/2015   Procedure: LEFT WRIST DEQUERVAINS;  Surgeon: DNinetta Lights MD;  [place] [facility]  Service: Orthopedics;  Laterality: Left;  . HERNIA REPAIR    . MOUTH SURGERY    . NASAL SINUS SURGERY    . SHOULDER SURGERY     right  . TONSILLECTOMY    . VENTRAL HERNIA REPAIR      Prior to Admission medications   Medication Sig Start Date End Date Taking? Authorizing Provider  Adalimumab (HUMIRA) 40 MG/0.8ML PSKT Inject 40 mg into the skin every 14 (fourteen) days. 09/22/14   Historical Provider, MD  aspirin 81 MG tablet Take 81 mg by mouth daily.    Historical Provider, MD  fluticasone (FLONASE) 50 MCG/ACT nasal spray 2 sprays daily. 09/23/14   Historical Provider, MD  gabapentin (NEURONTIN) 300 MG capsule take 2 tablets by mouth three times a day 03/18/16   Max T Hyatt, DPM  gabapentin (NEURONTIN) 300 MG capsule Take two tablets by  mouth three times daily. 03/18/16   Max T Hyatt, DPM  gabapentin (NEURONTIN) 300 MG capsule Take Three Capsules  (900 mg) by Mouth Three (3) Times Daily. 04/03/16   Max T Hyatt, DPM  glucose blood (FREESTYLE LITE) test strip Test blood sugar daily. Dx code: 790.29 07/19/14   Wendie Agreste, MD  glucose monitoring kit (FREESTYLE) monitoring kit 1 each by Does not apply route as needed for other. Dispense one glucometer of choice, testing strips/lancets for once per day glucose checks.  QS for 1 month, 11 refills. 05/28/13   Wendie Agreste, MD    hyoscyamine (LEVSIN/SL) 0.125 MG SL tablet Place 1 tablet (0.125 mg total) under the tongue every 6 (six) hours as needed. 12/26/15   Amy S Esterwood, PA-C  ibuprofen (ADVIL,MOTRIN) 200 MG tablet Take 600 mg by mouth daily as needed for headache or moderate pain.     Historical Provider, MD  lisinopril-hydrochlorothiazide (PRINZIDE,ZESTORETIC) 10-12.5 MG per tablet Take 1 tablet by mouth daily.      Historical Provider, MD  metFORMIN (GLUCOPHAGE) 500 MG tablet Take 1 tablet (500 mg total) by mouth daily with breakfast. PATIENT NEEDS OFFICE VISIT FOR ADDITIONAL REFILLS 01/31/15   Jaynee Eagles, PA-C  mupirocin ointment (BACTROBAN) 2 % Apply to wound twice a day. 10/04/15   Max T Hyatt, DPM  ondansetron (ZOFRAN) 4 MG tablet Take 1 tablet (4 mg total) by mouth every 8 (eight) hours as needed for nausea or vomiting. 01/14/17   Rudene Re, MD  oxyCODONE-acetaminophen (ROXICET) 5-325 MG tablet Take 1 tablet by mouth every 6 (six) hours as needed. 01/14/17 01/14/18  Rudene Re, MD  tamsulosin (FLOMAX) 0.4 MG CAPS capsule Take 1 capsule (0.4 mg total) by mouth daily. 01/14/17 01/19/17  Rudene Re, MD    Allergies Patient has no known allergies.  Family History  Problem Relation Age of Onset  . Breast cancer Mother   . Cancer Mother   . Heart disease Maternal Grandfather   . Colon polyps Maternal Aunt     Social History Social History  Substance Use Topics  . Smoking status: Never Smoker  . Smokeless tobacco: Never Used  . Alcohol use No    Review of Systems  Constitutional: Negative for fever. Eyes: Negative for visual changes. ENT: Negative for sore throat. Neck: No neck pain  Cardiovascular: Negative for chest pain. Respiratory: Negative for shortness of breath. Gastrointestinal: Negative for abdominal pain,  Diarrhea. + N/V Genitourinary: Negative for dysuria. + L flank pain Musculoskeletal: Negative for back pain. Skin: Negative for rash. Neurological: Negative for  headaches, weakness or numbness. Psych: No SI or HI  ____________________________________________   PHYSICAL EXAM:  VITAL SIGNS: ED Triage Vitals  Enc Vitals Group     BP 01/14/17 1931 (!) 164/69     Pulse Rate 01/14/17 1931 79     Resp 01/14/17 1931 18     Temp 01/14/17 1931 98.9 F (37.2 C)     Temp Source 01/14/17 1931 Oral     SpO2 01/14/17 1931 95 %     Weight 01/14/17 1932 199 lb (90.3 kg)     Height 01/14/17 1932 _0  (1.575 m)     Head Circumference --      Peak Flow --      Pain Score 01/14/17 1932 9     Pain Loc --      Pain Edu? --      Excl. in [place]? --     Constitutional: Alert and  oriented, mild distress due to pain.  HEENT:      Head: Normocephalic and atraumatic.         Eyes: Conjunctivae are normal. Sclera is non-icteric. EOMI. PERRL      Mouth/Throat: Mucous membranes are moist.       Neck: Supple with no signs of meningismus. Cardiovascular: Regular rate and rhythm. No murmurs, gallops, or rubs. 2+ symmetrical distal pulses are present in all extremities. No JVD. Respiratory: Normal respiratory effort. Lungs are clear to auscultation bilaterally. No wheezes, crackles, or rhonchi.  Gastrointestinal: Soft, non tender, and non distended with positive bowel sounds. No rebound or guarding. Genitourinary: L CVA tenderness. Musculoskeletal: Nontender with normal range of motion in all extremities. No edema, cyanosis, or erythema of extremities. Neurologic: Normal speech and language. Face is symmetric. Moving all extremities. No gross focal neurologic deficits are appreciated. Skin: Skin is warm, dry and intact. No rash noted. Psychiatric: Mood and affect are normal. Speech and behavior are normal.  ____________________________________________   LABS (all labs ordered are listed, but only abnormal results are displayed)  Labs Reviewed  CBC WITH DIFFERENTIAL/PLATELET - Abnormal; Notable for the following:       Result Value   Neutro Abs 6.7 (*)    All  other components within normal limits  BASIC METABOLIC PANEL - Abnormal; Notable for the following:    Glucose, Bld 196 (*)    All other components within normal limits  URINALYSIS, COMPLETE (UACMP) WITH MICROSCOPIC - Abnormal; Notable for the following:    Color, Urine YELLOW (*)    APPearance CLOUDY (*)    Hgb urine dipstick LARGE (*)    Protein, ur 30 (*)    Squamous Epithelial / LPF 0-5 (*)    All other components within normal limits  URINE CULTURE   ____________________________________________  EKG  none  ____________________________________________  RADIOLOGY  CT renal: 1. Bilateral obstructive uropathy worse on the left. Left UVJ 5 x 8 mm calculus. Right proximal ureter 3 mm calculus. No intrarenal calculi identified. 2. Calcified aortic atherosclerosis. 3. Chronic fatty liver disease. 4. Previous ventral abdominal hernia repair with mesh.  ____________________________________________   PROCEDURES  Procedure(s) performed: None Procedures Critical Care performed:  None ____________________________________________   INITIAL IMPRESSION / ASSESSMENT AND PLAN / ED COURSE  66 y.o. female with a history of diabetes, COPD, hypertension, hyperlipidemia, and psoriasis who presents for evaluation of sudden onset severe left flank pain associated with nausea and vomiting. Patient is in obvious distress due to the pain, she has significant left flank tenderness. Her vital signs are within normal limits. Abdomen is soft and nontender throughout. Presentation concerning for kidney stone. We'll check basic blood work, urinalysis, urine culture, send patient for CT renal protocol. We'll give morphine, IV fluids and Zofran for symptoms.  Clinical Course as of Jan 14 2114  Tue Jan 14, 2017  2107 CT concerning for 8 millimeters stone in the left UVJ and 3 mm stone on the right ureter. Patient's pain is well controlled. No evidence of a KI. No evidence of UTI. She is tolerating by  mouth. We'll discharge home with Flomax, ibuprofen, Percocet, Zofran, and referral to urology.  [CV]    Clinical Course User Index [CV] Rudene Re, MD    Pertinent labs & imaging results that were available during my care of the patient were reviewed by me and considered in my medical decision making (see chart for details).    ____________________________________________   FINAL CLINICAL IMPRESSION(S) / ED DIAGNOSES  Final diagnoses:  Kidney stone      NEW MEDICATIONS STARTED DURING THIS VISIT:  New Prescriptions   ONDANSETRON (ZOFRAN) 4 MG TABLET    Take 1 tablet (4 mg total) by mouth every 8 (eight) hours as needed for nausea or vomiting.   OXYCODONE-ACETAMINOPHEN (ROXICET) 5-325 MG TABLET    Take 1 tablet by mouth every 6 (six) hours as needed.   TAMSULOSIN (FLOMAX) 0.4 MG CAPS CAPSULE    Take 1 capsule (0.4 mg total) by mouth daily.     Note:  This document was prepared using Dragon voice recognition software and may include unintentional dictation errors.    Rudene Re, MD 01/14/17 2115

## 2017-01-14 NOTE — ED Notes (Signed)
Pt reports that at 530pm she started with severe back and flank pain with vomiting - at this time pt has CVA tenderness - MD has already assessed pt

## 2017-01-16 ENCOUNTER — Ambulatory Visit (INDEPENDENT_AMBULATORY_CARE_PROVIDER_SITE_OTHER): Payer: Medicare Other | Admitting: Urology

## 2017-01-16 ENCOUNTER — Ambulatory Visit
Admission: RE | Admit: 2017-01-16 | Discharge: 2017-01-16 | Disposition: A | Discharge status: Home / Self Care | Payer: Medicare Other | Source: Ambulatory Visit | Attending: Urology | Admitting: Urology

## 2017-01-16 ENCOUNTER — Telehealth: Payer: Self-pay | Admitting: Urology

## 2017-01-16 ENCOUNTER — Encounter: Payer: Self-pay | Admitting: Urology

## 2017-01-16 VITALS — BP 166/85 | HR 85 | Ht 62.0 in | Wt 202.0 lb

## 2017-01-16 DIAGNOSIS — N200 Calculus of kidney: Secondary | ICD-10-CM | POA: Diagnosis not present

## 2017-01-16 DIAGNOSIS — N1330 Unspecified hydronephrosis: Secondary | ICD-10-CM | POA: Diagnosis not present

## 2017-01-16 DIAGNOSIS — N201 Calculus of ureter: Secondary | ICD-10-CM | POA: Diagnosis not present

## 2017-01-16 LAB — URINALYSIS, COMPLETE
Bilirubin, UA: NEGATIVE
GLUCOSE, UA: NEGATIVE
Ketones, UA: NEGATIVE
Nitrite, UA: NEGATIVE
PH UA: 5 (ref 5.0–7.5)
Specific Gravity, UA: 1.025 (ref 1.005–1.030)
Urobilinogen, Ur: 0.2 mg/dL (ref 0.2–1.0)

## 2017-01-16 LAB — MICROSCOPIC EXAMINATION

## 2017-01-16 LAB — URINE CULTURE

## 2017-01-16 NOTE — Telephone Encounter (Signed)
KUB reviewed today. Unfortunate, the stones are not easily visualized on x-ray which made shockwave lithotripsy may be off the table.  Recommend getting a renal ultrasound either tomorrow or early next week to reassess for residual hydronephrosis to see if she is by chance pass the stones.  Please hold off on looking the procedure until we've reviewed these renal ultrasound results.  Hollice Espy, MD

## 2017-01-16 NOTE — Progress Notes (Signed)
01/16/2017 9:56 AM   Abigail [name] [birth year] [id number]  Referring provider: Alycia Rossetti, MD [address] [place], [place] [address]  Chief Complaint  Patient presents with  . Nephrolithiasis    New Patient    HPI: 66 year old female who presents today for evaluation of bilateral ureteral calculi. She was seen in the emergency room on 01/14/2017 with acute onset bilateral flank pain.  At that time, she was found to have 8 x 5 mm left UVJ calculus as well as a 3 mm right proximal ureteral calculus. There were no additional intrarenal calculi appreciated. She had no evidence of leukocytosis, acute kidney injury, or UTI. Her pain was able to be controlled and she was discharged home with urologic follow-up.  She continues to have left-sided flank pain. She has been able to tolerate fluids. No fevers or chills.  No dysuria or gross hematuria.  She denies previous history of kidney stones.   PMH: Past Medical History:  Diagnosis Date  . Arthritis   . Cancer ([place])   . COPD (chronic obstructive pulmonary disease) ([place])   . Diabetes mellitus ([place])    a. A1c 6.6 in 05/2013 indicating new dx.  . Fatty liver   . Female stress incontinence   . GERD (gastroesophageal reflux disease)   . Heart murmur   . Hemorrhoids   . Hyperlipidemia   . Hyperplastic colon polyp 06/07/2008  . Hypertension   . Hypertriglyceridemia   . IBS (irritable bowel syndrome)   . Lung nodule    a. 7m left lung nodule by CT 05/2013.  . Obesity   . Psoriasis   . Skin cancer     Surgical History: Past Surgical History:  Procedure Laterality Date  . CATARACT EXTRACTION W/PHACO Left 10/06/2014   Procedure: CATARACT EXTRACTION PHACO AND INTRAOCULAR LENS PLACEMENT LEFT EYE;  Surgeon: KTonny Branch MD;  Location: [facility];  Service: Ophthalmology;  Laterality: Left;  CDE 7.35  . CATARACT EXTRACTION W/PHACO Right 11/14/2014   Procedure: CATARACT EXTRACTION PHACO AND INTRAOCULAR LENS PLACEMENT RIGHT EYE;   Surgeon: KTonny Branch MD;  Location: [facility];  Service: Ophthalmology;  Laterality: Right;  CDE:6.39  . DORSAL COMPARTMENT RELEASE Left 06/15/2015   Procedure: LEFT WRIST DEQUERVAINS;  Surgeon: DNinetta Lights MD;  Location: [facility]  Service: Orthopedics;  Laterality: Left;  . HERNIA REPAIR    . MOUTH SURGERY    . NASAL SINUS SURGERY    . SHOULDER SURGERY     right  . TONSILLECTOMY    . VENTRAL HERNIA REPAIR      Home Medications:  Allergies as of 01/16/2017   No Known Allergies     Medication List       Accurate as of 01/16/17 11:59 PM. Always use your most recent med list.          aspirin 81 MG tablet Take 81 mg by mouth daily.   fluticasone 50 MCG/ACT nasal spray Commonly known as:  FLONASE 2 sprays daily.   gabapentin 300 MG capsule Commonly known as:  NEURONTIN Take Three Capsules  (900 mg) by Mouth Three (3) Times Daily.   glucose monitoring kit monitoring kit 1 each by Does not apply route as needed for other. Dispense one glucometer of choice, testing strips/lancets for once per day glucose checks.  QS for 1 month, 11 refills.   HUMIRA 40 MG/0.8ML Pskt Generic drug:  Adalimumab Inject 40 mg into the skin every 14 (fourteen) days.  hyoscyamine 0.125 MG SL tablet Commonly known as:  LEVSIN/SL Place 1 tablet (0.125 mg total) under the tongue every 6 (six) hours as needed.   ibuprofen 200 MG tablet Commonly known as:  ADVIL,MOTRIN Take 600 mg by mouth daily as needed for headache or moderate pain.   lisinopril-hydrochlorothiazide 10-12.5 MG tablet Commonly known as:  PRINZIDE,ZESTORETIC Take 1 tablet by mouth daily.   metFORMIN 500 MG tablet Commonly known as:  GLUCOPHAGE Take 1 tablet (500 mg total) by mouth daily with breakfast. PATIENT NEEDS OFFICE VISIT FOR ADDITIONAL REFILLS   mupirocin ointment 2 % Commonly known as:  BACTROBAN Apply to wound twice a day.   ondansetron 4 MG tablet Commonly known as:  ZOFRAN Take 1 tablet (4  mg total) by mouth every 8 (eight) hours as needed for nausea or vomiting.   oxyCODONE-acetaminophen 5-325 MG tablet Commonly known as:  ROXICET Take 1 tablet by mouth every 6 (six) hours as needed.   tamsulosin 0.4 MG Caps capsule Commonly known as:  FLOMAX Take 1 capsule (0.4 mg total) by mouth daily.       Allergies: No Known Allergies  Family History: Family History  Problem Relation Age of Onset  . Breast cancer Mother   . Cancer Mother   . Heart disease Maternal Grandfather   . Colon polyps Maternal Aunt     Social History:  reports that she has never smoked. She has never used smokeless tobacco. She reports that she does not drink alcohol or use drugs.  ROS: UROLOGY Frequent Urination?: Yes Hard to postpone urination?: Yes Burning/pain with urination?: No Get up at night to urinate?: Yes Leakage of urine?: Yes Urine stream starts and stops?: No Trouble starting stream?: No Do you have to strain to urinate?: No Blood in urine?: No Urinary tract infection?: No Sexually transmitted disease?: No Injury to kidneys or bladder?: No Painful intercourse?: No Weak stream?: No Currently pregnant?: No Vaginal bleeding?: No Last menstrual period?: n  Gastrointestinal Nausea?: Yes Vomiting?: Yes Indigestion/heartburn?: No Diarrhea?: Yes Constipation?: No  Constitutional Fever: No Night sweats?: No Weight loss?: No Fatigue?: No  Skin Skin rash/lesions?: No Itching?: No  Eyes Blurred vision?: Yes Double vision?: No  Ears/Nose/Throat Sore throat?: No Sinus problems?: Yes  Hematologic/Lymphatic Swollen glands?: No Easy bruising?: No  Cardiovascular Leg swelling?: No Chest pain?: No  Respiratory Cough?: Yes Shortness of breath?: Yes  Endocrine Excessive thirst?: No  Musculoskeletal Back pain?: No Joint pain?: Yes  Neurological Headaches?: No Dizziness?: No  Psychologic Depression?: No Anxiety?: No  Physical Exam: BP (!) 166/85    Pulse 85   Ht _0  (1.575 m)   Wt 202 lb (91.6 kg)   BMI 36.95 kg/m   Constitutional:  Alert and oriented, No acute distress. HEENT: [place] AT, moist mucus membranes.  Trachea midline, no masses. Cardiovascular: No clubbing, cyanosis, or edema. Respiratory: Normal respiratory effort, no increased work of breathing. GI: Abdomen is soft, nontender, nondistended, no abdominal masses GU: No CVA tenderness.  Skin: No rashes, bruises or suspicious lesions. Lymph: No cervical or inguinal adenopathy. Neurologic: Grossly intact, no focal deficits, moving all 4 extremities. Psychiatric: Normal mood and affect.  Laboratory Data: Lab Results  Component Value Date   WBC 8.1 01/18/2017   HGB 13.2 01/18/2017   HCT 37.9 01/18/2017   MCV 86.5 01/18/2017   PLT 169 01/18/2017    Lab Results  Component Value Date   CREATININE 0.60 01/18/2017    Lab Results  Component Value Date  HGBA1C 6.3 05/12/2014    Urinalysis Component     Latest Ref Rng & Units 01/14/2017  Color, Urine     YELLOW YELLOW (A)  Appearance     CLEAR CLOUDY (A)  Specific Gravity, Urine     1.005 - 1.030 1.018  pH     5.0 - 8.0 5.0  Glucose     NEGATIVE mg/dL NEGATIVE  Hgb urine dipstick     NEGATIVE LARGE (A)  Bilirubin Urine     NEGATIVE NEGATIVE  Ketones, ur     NEGATIVE mg/dL NEGATIVE  Protein     NEGATIVE mg/dL 30 (A)  Nitrite     NEGATIVE NEGATIVE  Leukocytes, UA     NEGATIVE NEGATIVE  RBC / HPF     0 - 5 RBC/hpf TOO NUMEROUS TO COUNT  WBC, UA     0 - 5 WBC/hpf 0-5  Bacteria, UA     NONE SEEN NONE SEEN  Squamous Epithelial / LPF     NONE SEEN 0-5 (A)  Mucous      PRESENT    Pertinent Imaging: CLINICAL DATA:  66 year old female left flank pain acute onset today. Subsequent vomiting. Initial encounter.  EXAM: CT ABDOMEN AND PELVIS WITHOUT CONTRAST  TECHNIQUE: Multidetector CT imaging of the abdomen and pelvis was performed following the standard protocol without IV  contrast.  COMPARISON:  [facility] CT Abdomen and Pelvis 06/03/2005  FINDINGS: Lower chest: Chronic elevation of the right hemidiaphragm. Mildly lower lung volumes today. Similar lung base atelectasis. No pleural effusion. Mild cardiomegaly. No pericardial effusion.  Hepatobiliary: Chronic hepatic steatosis. Mild fatty sparing at the gallbladder fossa. Negative gallbladder. No biliary ductal enlargement.  Pancreas: Negative.  Spleen: Negative.  Adrenals/Urinary Tract: Chronic left adrenal adenoma has not significantly changed since 2006 and measures -0.2 Hounsfield units. Normal right adrenal gland.  Mild to moderate right hydronephrosis with a 3 mm proximal right ureteral calculus located about 2 cm from the right ureteral pelvic junction as seen on coronal image 101. Distal to this calculus the right ureter is normal to the urinary bladder.  The urinary bladder is diminutive and unremarkable.  There is also mild to moderate left hydronephrosis, with mild left perinephric and proximal left periureteral stranding which is greater than that on the right. There is a left read row pelvic junction level calculus within irregular-shaped measuring 5 x 8 mm. See coronal image 93. Distal to this calculus the left ureter appears normal to the UVJ.  There are small pelvic phleboliths. No intrarenal calculi identified.  No abdominal free fluid.  Stomach/Bowel: Retained stool in the rectum. Mostly decompressed sigmoid colon with some redundancy. Occasional sigmoid diverticula without active inflammation. Negative left colon. Retained stool in the transverse and right colon. Normal appendix. Negative terminal ileum. No dilated small bowel. Decompressed and negative stomach. Negative duodenum aside from a 2.7 cm duodenum diverticulum at the third portion (series 2, image 40).  Vascular/Lymphatic: Calcified aortic atherosclerosis. Vascular patency is not  evaluated in the absence of IV contrast.  Reproductive: Negative noncontrast uterus and adnexa.  Other: No pelvic free fluid. Previous ventral hernia repair with mesh. Small fat containing right inguinal hernia. Small fat containing umbilical hernia. Second small midline fat containing hernia or diastases located about 7 cm cephalad of the umbilicus (sagittal image 106).  Musculoskeletal: Osteopenia. Mild lumbar scoliosis. No acute osseous abnormality identified.  IMPRESSION: 1. Bilateral obstructive uropathy worse on the left. Left UVJ 5 x 8 mm calculus. Right proximal ureter 3  mm calculus. No intrarenal calculi identified. 2.  Calcified aortic atherosclerosis. 3. Chronic fatty liver disease. 4. Previous ventral abdominal hernia repair with mesh.   Electronically Signed   By: Genevie Ann M.D.   On: 01/14/2017 20:12  Assessment & Plan:    1. Left ureteral stone 8 mm left UPJ stone, symptomatic without signs or symptoms of infection. We discussed the given the size of the stone, she has approximate 50% chance of passing it spontaneously with medical expulsive therapy.  We discussed various treatment options including ESWL vs. ureteroscopy, laser lithotripsy, and stent. We discussed the risks and benefits of both including bleeding, infection, damage to surrounding structures, efficacy with need for possible further intervention, and need for temporary ureteral stent.   She is most interested shockwave lithotripsy. She was sent over following clinic for KUB in the stone was not able to be appreciated.    We will arrange for follow-up renal ultrasound to assess whether or not there is residual hydronephrosis. In this situation, she may consider proceeding with left ureteroscopy versus medical expulsive therapy.  All of her answer. We will call the patient with her renal ultrasound results.  Warning symptoms for urgent medical attention were reviewed today in detail.  This  includes low or diminished urine output.  - Urinalysis, Complete - Abdomen 1 view (KUB); Future  2. Right ureteral stone 3 mm right proximal ureteral stone.  Based on the size of the stone, she will likely pass this spontaneously. Continue Flomax  3. Bilateral hydronephrosis Secondary to bilateral stones, renal function preserved therefore likely only partially obstructive   Will call with renal ultrasound results  Hollice Espy, MD  [facility] [address], [place], [place] [address] [phone]

## 2017-01-16 NOTE — Telephone Encounter (Signed)
Done ° ° °michelle °

## 2017-01-17 ENCOUNTER — Ambulatory Visit
Admission: RE | Admit: 2017-01-17 | Discharge: 2017-01-17 | Disposition: A | Discharge status: Home / Self Care | Payer: Medicare Other | Source: Ambulatory Visit | Attending: Urology | Admitting: Urology

## 2017-01-17 ENCOUNTER — Telehealth: Payer: Self-pay | Admitting: Urology

## 2017-01-17 DIAGNOSIS — N132 Hydronephrosis with renal and ureteral calculous obstruction: Secondary | ICD-10-CM | POA: Diagnosis not present

## 2017-01-17 DIAGNOSIS — N1330 Unspecified hydronephrosis: Secondary | ICD-10-CM | POA: Diagnosis not present

## 2017-01-17 DIAGNOSIS — N201 Calculus of ureter: Secondary | ICD-10-CM | POA: Diagnosis present

## 2017-01-17 MED ORDER — TAMSULOSIN HCL 0.4 MG PO CAPS: 0.4000 mg | ORAL_CAPSULE | Freq: Every day | ORAL | 0 refills | Status: DC

## 2017-01-17 NOTE — Telephone Encounter (Signed)
Patient has been notified   Abigail [name]

## 2017-01-17 NOTE — Telephone Encounter (Signed)
Renal ultrasound shows persistent bilateral hydronephrosis, left greater than right. She continues to have mild left flank pain. Stones not visualized on KUB.  Lengthy discussion today by telephone regarding management options. These include continued surveillance with extreme precaution given bilateral nature of the stone, or proceeding with ureteroscopy. The procedure was reviewed again today in detail. All questions were answered.  She would like to continue to strain her urine, take Flomax, and hope that the stones pass. She was able to verbalize all precautions.  We'll have her follow-up in approximate 10 days to reassess.  Hollice Espy, MD

## 2017-01-18 ENCOUNTER — Emergency Department
Admission: EM | Admit: 2017-01-18 | Discharge: 2017-01-18 | Disposition: A | Discharge status: Home / Self Care | Payer: Medicare Other | Attending: Emergency Medicine | Admitting: Emergency Medicine

## 2017-01-18 ENCOUNTER — Encounter: Payer: Self-pay | Admitting: Emergency Medicine

## 2017-01-18 DIAGNOSIS — R109 Unspecified abdominal pain: Secondary | ICD-10-CM | POA: Diagnosis present

## 2017-01-18 DIAGNOSIS — J449 Chronic obstructive pulmonary disease, unspecified: Secondary | ICD-10-CM | POA: Diagnosis not present

## 2017-01-18 DIAGNOSIS — E119 Type 2 diabetes mellitus without complications: Secondary | ICD-10-CM | POA: Insufficient documentation

## 2017-01-18 DIAGNOSIS — I10 Essential (primary) hypertension: Secondary | ICD-10-CM | POA: Insufficient documentation

## 2017-01-18 DIAGNOSIS — N200 Calculus of kidney: Secondary | ICD-10-CM | POA: Insufficient documentation

## 2017-01-18 DIAGNOSIS — Z7984 Long term (current) use of oral hypoglycemic drugs: Secondary | ICD-10-CM | POA: Diagnosis not present

## 2017-01-18 DIAGNOSIS — Z791 Long term (current) use of non-steroidal anti-inflammatories (NSAID): Secondary | ICD-10-CM | POA: Insufficient documentation

## 2017-01-18 DIAGNOSIS — Z79899 Other long term (current) drug therapy: Secondary | ICD-10-CM | POA: Insufficient documentation

## 2017-01-18 LAB — URINALYSIS, COMPLETE (UACMP) WITH MICROSCOPIC
BILIRUBIN URINE: NEGATIVE
Bacteria, UA: NONE SEEN
Glucose, UA: 50 mg/dL — AB
KETONES UR: NEGATIVE mg/dL
NITRITE: NEGATIVE
PH: 5 (ref 5.0–8.0)
Protein, ur: NEGATIVE mg/dL
Specific Gravity, Urine: 1.014 (ref 1.005–1.030)

## 2017-01-18 LAB — COMPREHENSIVE METABOLIC PANEL
ALBUMIN: 3.9 g/dL (ref 3.5–5.0)
ALT: 29 U/L (ref 14–54)
ANION GAP: 6 (ref 5–15)
AST: 63 U/L — AB (ref 15–41)
Alkaline Phosphatase: 57 U/L (ref 38–126)
BILIRUBIN TOTAL: 0.8 mg/dL (ref 0.3–1.2)
BUN: 16 mg/dL (ref 6–20)
CHLORIDE: 106 mmol/L (ref 101–111)
CO2: 26 mmol/L (ref 22–32)
Calcium: 8.7 mg/dL — ABNORMAL LOW (ref 8.9–10.3)
Creatinine, Ser: 0.6 mg/dL (ref 0.44–1.00)
GFR calc Af Amer: >60 mL/min (ref >60)
GFR calc non Af Amer: >60 mL/min (ref >60)
GLUCOSE: 164 mg/dL — AB (ref 65–99)
POTASSIUM: 3.6 mmol/L (ref 3.5–5.1)
SODIUM: 138 mmol/L (ref 135–145)
Total Protein: 6.9 g/dL (ref 6.5–8.1)

## 2017-01-18 LAB — CBC WITH DIFFERENTIAL/PLATELET
BASOS ABS: 0 10*3/uL (ref 0–0.1)
Basophils Relative: 1 %
EOS PCT: 1 %
Eosinophils Absolute: 0.1 10*3/uL (ref 0–0.7)
HEMATOCRIT: 37.9 % (ref 35.0–47.0)
Hemoglobin: 13.2 g/dL (ref 12.0–16.0)
LYMPHS ABS: 1.6 10*3/uL (ref 1.0–3.6)
LYMPHS PCT: 19 %
MCH: 30.2 pg (ref 26.0–34.0)
MCHC: 35 g/dL (ref 32.0–36.0)
MCV: 86.5 fL (ref 80.0–100.0)
MONO ABS: 0.6 10*3/uL (ref 0.2–0.9)
MONOS PCT: 7 %
NEUTROS ABS: 5.9 10*3/uL (ref 1.4–6.5)
Neutrophils Relative %: 72 %
PLATELETS: 169 10*3/uL (ref 150–440)
RBC: 4.38 MIL/uL (ref 3.80–5.20)
RDW: 12.6 % (ref 11.5–14.5)
WBC: 8.1 10*3/uL (ref 3.6–11.0)

## 2017-01-18 MED ORDER — SODIUM CHLORIDE 0.9 % IV BOLUS (SEPSIS)
1000.0000 mL | Freq: Once | INTRAVENOUS | Status: AC
  Administered 2017-01-18: 1000 mL via INTRAVENOUS

## 2017-01-18 MED ORDER — MORPHINE SULFATE (PF) 4 MG/ML IV SOLN
4.0000 mg | Freq: Once | INTRAVENOUS | Status: AC
  Administered 2017-01-18: 4 mg via INTRAVENOUS
  Filled 2017-01-18: qty 1

## 2017-01-18 MED ORDER — ONDANSETRON HCL 4 MG/2ML IJ SOLN
4.0000 mg | Freq: Once | INTRAMUSCULAR | Status: AC
  Administered 2017-01-18: 4 mg via INTRAVENOUS
  Filled 2017-01-18: qty 2

## 2017-01-18 NOTE — ED Triage Notes (Addendum)
Pt to ed with c/o left flank pain that started last night around 1230.  Pt states she was seen here on Tuesday for same, dx with kidney stones.  No difficulty with urination at this time.

## 2017-01-18 NOTE — ED Notes (Signed)
Pt. States she went to urologist this week and that the urologist saw "Swelling on her kidney" and that she is scheduled for stent placement next week. Pt said she took oxycodone at 0030 this morning when it got too painful, and she took another oxycodone 0530 this morning but it did not touch her pain. Pt states she had a BM yesterday as she had previously been constipated.

## 2017-01-18 NOTE — ED Notes (Signed)
NAD noted at time of D/C. Pt denies questions or concerns. Pt ambulatory to the lobby at this time. Pt refused wheelchair to the lobby.  

## 2017-01-18 NOTE — ED Provider Notes (Signed)
[facility] Emergency Department Provider Note  ____________________________________________   First MD Initiated Contact with Patient 01/18/17 1039     (approximate)  I have reviewed the triage vital signs and the nursing notes.   HISTORY  Chief Complaint Flank Pain   HPI Abigail [name] is a 66 y.o. female with a recent visit for kidney stones was presenting with recurrent left flank pain. She rates her pain as 6 out of 10 at this time and is associated with nausea. She describes the pain as sharp.She was seen in this emergency department on March 13 and was found to have a 5 x 8 mm left-sided UVJ stone. Her pain was well controlled with morphine. She says that she has only taken one dose of hydrocodone at home and it was this morning when the pain returned. She also had an ultrasound on March 16 which showed moderate left-sided hydronephrosis and right-sided wild to moderate hydronephrosis.   Past Medical History:  Diagnosis Date  . Arthritis   . Cancer ([place])   . COPD (chronic obstructive pulmonary disease) ([place])   . Diabetes mellitus ([place])    a. A1c 6.6 in 05/2013 indicating new dx.  . Fatty liver   . Female stress incontinence   . GERD (gastroesophageal reflux disease)   . Heart murmur   . Hemorrhoids   . Hyperlipidemia   . Hyperplastic colon polyp 06/07/2008  . Hypertension   . Hypertriglyceridemia   . IBS (irritable bowel syndrome)   . Lung nodule    a. 70m left lung nodule by CT 05/2013.  . Obesity   . Psoriasis   . Skin cancer     Patient Active Problem List   Diagnosis Date Noted  . 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 Pulmonary nodule 05/21/2013  . Chest pain 05/20/2013  . IRRITABLE BOWEL SYNDROME 05/18/2008  . HYPERLIPIDEMIA 05/17/2008  . HYPERTENSION 05/17/2008  . HEMORRHOIDS 05/17/2008  . GERD 05/17/2008  . FATTY LIVER DISEASE 05/17/2008  . PSORIASIS 05/17/2008  . ARTHRITIS 05/17/2008  . DIARRHEA 05/17/2008  . STRESS  INCONTINENCE 05/17/2008    Past Surgical History:  Procedure Laterality Date  . CATARACT EXTRACTION W/PHACO Left 10/06/2014   Procedure: CATARACT EXTRACTION PHACO AND INTRAOCULAR LENS PLACEMENT LEFT EYE;  Surgeon: KTonny Branch MD;  Location: [facility];  Service: Ophthalmology;  Laterality: Left;  CDE 7.35  . CATARACT EXTRACTION W/PHACO Right 11/14/2014   Procedure: CATARACT EXTRACTION PHACO AND INTRAOCULAR LENS PLACEMENT RIGHT EYE;  Surgeon: KTonny Branch MD;  Location: [facility];  Service: Ophthalmology;  Laterality: Right;  CDE:6.39  . DORSAL COMPARTMENT RELEASE Left 06/15/2015   Procedure: LEFT WRIST DEQUERVAINS;  Surgeon: DNinetta Lights MD;  [facility]  Service: Orthopedics;  Laterality: Left;  . HERNIA REPAIR    . MOUTH SURGERY    . NASAL SINUS SURGERY    . SHOULDER SURGERY     right  . TONSILLECTOMY    . VENTRAL HERNIA REPAIR      Prior to Admission medications   Medication Sig Start Date End Date Taking? Authorizing Provider  Adalimumab (HUMIRA) 40 MG/0.8ML PSKT Inject 40 mg into the skin every 14 (fourteen) days. 09/22/14  Yes Historical Provider, MD  aspirin 81 MG tablet Take 81 mg by mouth daily.   Yes Historical Provider, MD  fluticasone (FLONASE) 50 MCG/ACT nasal spray 2 sprays daily. 09/23/14  Yes Historical Provider, MD  gabapentin (NEURONTIN) 300 MG capsule Take Three Capsules  (900 mg) by  Mouth Three (3) Times Daily. 04/03/16  Yes Max T Hyatt, DPM  hyoscyamine (LEVSIN/SL) 0.125 MG SL tablet Place 1 tablet (0.125 mg total) under the tongue every 6 (six) hours as needed. 12/26/15  Yes Amy S Esterwood, PA-C  ibuprofen (ADVIL,MOTRIN) 200 MG tablet Take 600 mg by mouth daily as needed for headache or moderate pain.    Yes Historical Provider, MD  lisinopril-hydrochlorothiazide (PRINZIDE,ZESTORETIC) 10-12.5 MG per tablet Take 1 tablet by mouth daily.     Yes Historical Provider, MD  metFORMIN (GLUCOPHAGE) 500 MG tablet Take 1 tablet (500 mg total) by mouth daily  with breakfast. PATIENT NEEDS OFFICE VISIT FOR ADDITIONAL REFILLS 01/31/15  Yes Jaynee Eagles, PA-C  ondansetron (ZOFRAN) 4 MG tablet Take 1 tablet (4 mg total) by mouth every 8 (eight) hours as needed for nausea or vomiting. 01/14/17  Yes Rudene Re, MD  oxyCODONE-acetaminophen (ROXICET) 5-325 MG tablet Take 1 tablet by mouth every 6 (six) hours as needed. 01/14/17 01/14/18 Yes Rudene Re, MD  tamsulosin (FLOMAX) 0.4 MG CAPS capsule Take 1 capsule (0.4 mg total) by mouth daily. 01/17/17 01/22/17 Yes Hollice Espy, MD  glucose monitoring kit (FREESTYLE) monitoring kit 1 each by Does not apply route as needed for other. Dispense one glucometer of choice, testing strips/lancets for once per day glucose checks.  QS for 1 month, 11 refills. 05/28/13   Wendie Agreste, MD  mupirocin ointment (BACTROBAN) 2 % Apply to wound twice a day. Patient not taking: Reported on 01/18/2017 10/04/15   Max T Hyatt, DPM    Allergies Patient has no known allergies.  Family History  Problem Relation Age of Onset  . Breast cancer Mother   . Cancer Mother   . Heart disease Maternal Grandfather   . Colon polyps Maternal Aunt     Social History Social History  Substance Use Topics  . Smoking status: Never Smoker  . Smokeless tobacco: Never Used  . Alcohol use No    Review of Systems Constitutional: No fever/chills Eyes: No visual changes. ENT: No sore throat. Cardiovascular: Denies chest pain. Respiratory: Denies shortness of breath. Gastrointestinal:  No nausea, no vomiting.  No diarrhea.  No constipation. Genitourinary: Negative for dysuria. Musculoskeletal:left flank pain Skin: Negative for rash. Neurological: Negative for headaches, focal weakness or numbness.  10-point ROS otherwise negative.  ____________________________________________   PHYSICAL EXAM:  VITAL SIGNS: ED Triage Vitals  Enc Vitals Group     BP 01/18/17 1036 (!) 155/62     Pulse Rate 01/18/17 1036 77     Resp  01/18/17 1036 20     Temp 01/18/17 1036 98.5 F (36.9 C)     Temp Source 01/18/17 1036 Oral     SpO2 01/18/17 1036 95 %     Weight 01/18/17 1004 199 lb (90.3 kg)     Height 01/18/17 1004 _0  (1.575 m)     Head Circumference --      Peak Flow --      Pain Score 01/18/17 1004 7     Pain Loc --      Pain Edu? --      Excl. in [place]? --     Constitutional: Alert and oriented. Appears uncomfortable.   Eyes: Conjunctivae are normal. PERRL. EOMI. Head: Atraumatic. Nose: No congestion/rhinnorhea. Mouth/Throat: Mucous membranes are moist.  Neck: No stridor.   Cardiovascular: Normal rate, regular rhythm. Grossly normal heart sounds.  Respiratory: Normal respiratory effort.  No retractions. Lungs CTAB. Gastrointestinal: Soft and nontender. No distention. Left  sided CVA tenderness to palpation. Musculoskeletal: No lower extremity tenderness nor edema.  No joint effusions. Neurologic:  Normal speech and language. No gross focal neurologic deficits are appreciated.  Skin:  Skin is warm, dry and intact. No rash noted. Psychiatric: Mood and affect are normal. Speech and behavior are normal.  ____________________________________________   LABS (all labs ordered are listed, but only abnormal results are displayed)  Labs Reviewed  URINALYSIS, COMPLETE (UACMP) WITH MICROSCOPIC - Abnormal; Notable for the following:       Result Value   Color, Urine YELLOW (*)    APPearance CLEAR (*)    Glucose, UA 50 (*)    Hgb urine dipstick LARGE (*)    Leukocytes, UA TRACE (*)    Squamous Epithelial / LPF 0-5 (*)    All other components within normal limits  COMPREHENSIVE METABOLIC PANEL - Abnormal; Notable for the following:    Glucose, Bld 164 (*)    Calcium 8.7 (*)    AST 63 (*)    All other components within normal limits  CBC WITH DIFFERENTIAL/PLATELET    ____________________________________________  EKG   ____________________________________________  RADIOLOGY   ____________________________________________   PROCEDURES  Procedure(s) performed:   Procedures  Critical Care performed:   ____________________________________________   INITIAL IMPRESSION / ASSESSMENT AND PLAN / ED COURSE  Pertinent labs & imaging results that were available during my care of the patient were reviewed by me and considered in my medical decision making (see chart for details).  ----------------------------------------- 1:28 PM on 01/18/2017 -----------------------------------------  Patient resting comfortably and is pain-free at this time. We discussed taking 2 hydrocodone at a time if she feels the pain is bad enough as well as to return to the emergency department immediately for any worsening or concerning symptoms. Patient's labs very reassuring with a normal kidney function as well as a normal white blood cell count. Plan is for the patient to have expectant management as an outpatient with stenting with Dr. [name] on the 22nd unless worsening symptoms. Patient is understanding of this plan and willing to comply.      ____________________________________________   FINAL CLINICAL IMPRESSION(S) / ED DIAGNOSES  Kidney stones.    NEW MEDICATIONS STARTED DURING THIS VISIT:  New Prescriptions   No medications on file     Note:  This document was prepared using Dragon voice recognition software and may include unintentional dictation errors.    Orbie Pyo, MD 01/18/17 1330

## 2017-01-18 NOTE — ED Notes (Signed)
Pt resting in bed at this time with family at bedside. Pt states relief from pain. Pt is visualized in NAD at this time. Will continue to monitor for further patient needs.

## 2017-01-20 NOTE — Telephone Encounter (Signed)
Patient called back and states she would like to go ahead with URS per her conversation with Dr. [name]. Patient was booked for 01-28-17 with Dr. [name] for a Left URS with holmium and stent placement right retrograde. Called patient to notify her of dates and she states she was in terrible pain over the weekend and had to go to the ER she would like to move surgery up to this Tuesday as previously offered. Surgery was moved to 01-21-17 and patient was told to NPO from midnight on and to bring medications to pre-admit in the the morning at 9am. Patient verbalized agreement with this plan.

## 2017-01-21 ENCOUNTER — Encounter
Admission: RE | Disposition: A | Discharge status: Home / Self Care | Payer: Self-pay | Source: Ambulatory Visit | Attending: Urology

## 2017-01-21 ENCOUNTER — Ambulatory Visit: Payer: Medicare Other | Admitting: Anesthesiology

## 2017-01-21 ENCOUNTER — Encounter: Payer: Self-pay | Admitting: *Deleted

## 2017-01-21 ENCOUNTER — Ambulatory Visit
Admission: RE | Admit: 2017-01-21 | Discharge: 2017-01-21 | Disposition: A | Discharge status: Home / Self Care | Payer: Medicare Other | Source: Ambulatory Visit | Attending: Urology | Admitting: Urology

## 2017-01-21 DIAGNOSIS — Z961 Presence of intraocular lens: Secondary | ICD-10-CM | POA: Diagnosis not present

## 2017-01-21 DIAGNOSIS — Z803 Family history of malignant neoplasm of breast: Secondary | ICD-10-CM | POA: Insufficient documentation

## 2017-01-21 DIAGNOSIS — E781 Pure hyperglyceridemia: Secondary | ICD-10-CM | POA: Diagnosis not present

## 2017-01-21 DIAGNOSIS — K219 Gastro-esophageal reflux disease without esophagitis: Secondary | ICD-10-CM | POA: Insufficient documentation

## 2017-01-21 DIAGNOSIS — E785 Hyperlipidemia, unspecified: Secondary | ICD-10-CM | POA: Diagnosis not present

## 2017-01-21 DIAGNOSIS — E669 Obesity, unspecified: Secondary | ICD-10-CM | POA: Insufficient documentation

## 2017-01-21 DIAGNOSIS — J449 Chronic obstructive pulmonary disease, unspecified: Secondary | ICD-10-CM | POA: Insufficient documentation

## 2017-01-21 DIAGNOSIS — Z9842 Cataract extraction status, left eye: Secondary | ICD-10-CM | POA: Diagnosis not present

## 2017-01-21 DIAGNOSIS — N132 Hydronephrosis with renal and ureteral calculous obstruction: Secondary | ICD-10-CM | POA: Insufficient documentation

## 2017-01-21 DIAGNOSIS — K760 Fatty (change of) liver, not elsewhere classified: Secondary | ICD-10-CM | POA: Insufficient documentation

## 2017-01-21 DIAGNOSIS — Z6836 Body mass index (BMI) 36.0-36.9, adult: Secondary | ICD-10-CM | POA: Diagnosis not present

## 2017-01-21 DIAGNOSIS — Z7951 Long term (current) use of inhaled steroids: Secondary | ICD-10-CM | POA: Diagnosis not present

## 2017-01-21 DIAGNOSIS — K589 Irritable bowel syndrome without diarrhea: Secondary | ICD-10-CM | POA: Insufficient documentation

## 2017-01-21 DIAGNOSIS — Z8719 Personal history of other diseases of the digestive system: Secondary | ICD-10-CM | POA: Diagnosis not present

## 2017-01-21 DIAGNOSIS — Z85828 Personal history of other malignant neoplasm of skin: Secondary | ICD-10-CM | POA: Insufficient documentation

## 2017-01-21 DIAGNOSIS — I700 Atherosclerosis of aorta: Secondary | ICD-10-CM | POA: Insufficient documentation

## 2017-01-21 DIAGNOSIS — Z7984 Long term (current) use of oral hypoglycemic drugs: Secondary | ICD-10-CM | POA: Diagnosis not present

## 2017-01-21 DIAGNOSIS — M1991 Primary osteoarthritis, unspecified site: Secondary | ICD-10-CM | POA: Insufficient documentation

## 2017-01-21 DIAGNOSIS — Z8249 Family history of ischemic heart disease and other diseases of the circulatory system: Secondary | ICD-10-CM | POA: Insufficient documentation

## 2017-01-21 DIAGNOSIS — Z9841 Cataract extraction status, right eye: Secondary | ICD-10-CM | POA: Diagnosis not present

## 2017-01-21 DIAGNOSIS — Z9889 Other specified postprocedural states: Secondary | ICD-10-CM | POA: Insufficient documentation

## 2017-01-21 DIAGNOSIS — N201 Calculus of ureter: Secondary | ICD-10-CM | POA: Diagnosis not present

## 2017-01-21 DIAGNOSIS — I10 Essential (primary) hypertension: Secondary | ICD-10-CM | POA: Insufficient documentation

## 2017-01-21 DIAGNOSIS — Z7982 Long term (current) use of aspirin: Secondary | ICD-10-CM | POA: Insufficient documentation

## 2017-01-21 DIAGNOSIS — Z79899 Other long term (current) drug therapy: Secondary | ICD-10-CM | POA: Diagnosis not present

## 2017-01-21 DIAGNOSIS — Z8371 Family history of colonic polyps: Secondary | ICD-10-CM | POA: Insufficient documentation

## 2017-01-21 DIAGNOSIS — E119 Type 2 diabetes mellitus without complications: Secondary | ICD-10-CM | POA: Diagnosis not present

## 2017-01-21 DIAGNOSIS — Z79891 Long term (current) use of opiate analgesic: Secondary | ICD-10-CM | POA: Diagnosis not present

## 2017-01-21 HISTORY — PX: CYSTOSCOPY W/ RETROGRADES: SHX1426

## 2017-01-21 HISTORY — PX: CYSTOSCOPY/URETEROSCOPY/HOLMIUM LASER/STENT PLACEMENT: SHX6546

## 2017-01-21 LAB — GLUCOSE, CAPILLARY
GLUCOSE-CAPILLARY: 129 mg/dL — AB (ref 65–99)
GLUCOSE-CAPILLARY: 150 mg/dL — AB (ref 65–99)

## 2017-01-21 SURGERY — CYSTOSCOPY/URETEROSCOPY/HOLMIUM LASER/STENT PLACEMENT
Anesthesia: General | Laterality: Right | Wound class: Clean Contaminated

## 2017-01-21 MED ORDER — FENTANYL CITRATE (PF) 100 MCG/2ML IJ SOLN
INTRAMUSCULAR | Status: DC | PRN
  Administered 2017-01-21: 100 ug via INTRAVENOUS

## 2017-01-21 MED ORDER — ONDANSETRON HCL 4 MG/2ML IJ SOLN
INTRAMUSCULAR | Status: DC | PRN
  Administered 2017-01-21: 4 mg via INTRAVENOUS

## 2017-01-21 MED ORDER — LIDOCAINE HCL (CARDIAC) 20 MG/ML IV SOLN
INTRAVENOUS | Status: DC | PRN
  Administered 2017-01-21: 50 mg via INTRAVENOUS

## 2017-01-21 MED ORDER — SUGAMMADEX SODIUM 500 MG/5ML IV SOLN
INTRAVENOUS | Status: DC | PRN
  Administered 2017-01-21: 180.6 mg via INTRAVENOUS

## 2017-01-21 MED ORDER — ROCURONIUM BROMIDE 50 MG/5ML IV SOLN
INTRAVENOUS | Status: AC
  Filled 2017-01-21: qty 1

## 2017-01-21 MED ORDER — MIDAZOLAM HCL 2 MG/2ML IJ SOLN
INTRAMUSCULAR | Status: AC
  Filled 2017-01-21: qty 2

## 2017-01-21 MED ORDER — OXYCODONE HCL 5 MG/5ML PO SOLN: 5.0000 mg | Freq: Once | ORAL | Status: AC | PRN

## 2017-01-21 MED ORDER — CEFAZOLIN SODIUM-DEXTROSE 2-4 GM/100ML-% IV SOLN
2.0000 g | Freq: Once | INTRAVENOUS | Status: AC
  Administered 2017-01-21: 2 g via INTRAVENOUS

## 2017-01-21 MED ORDER — OXYCODONE-ACETAMINOPHEN 5-325 MG PO TABS: 1.0000 | ORAL_TABLET | Freq: Four times a day (QID) | ORAL | 0 refills | Status: DC | PRN

## 2017-01-21 MED ORDER — LABETALOL HCL 5 MG/ML IV SOLN
INTRAVENOUS | Status: DC | PRN
  Administered 2017-01-21 (×3): 5 mg via INTRAVENOUS

## 2017-01-21 MED ORDER — MEPERIDINE HCL 50 MG/ML IJ SOLN: 6.2500 mg | INTRAMUSCULAR | Status: DC | PRN

## 2017-01-21 MED ORDER — FENTANYL CITRATE (PF) 100 MCG/2ML IJ SOLN: 25.0000 ug | INTRAMUSCULAR | Status: DC | PRN

## 2017-01-21 MED ORDER — DEXAMETHASONE SODIUM PHOSPHATE 10 MG/ML IJ SOLN
INTRAMUSCULAR | Status: DC | PRN
  Administered 2017-01-21: 10 mg via INTRAVENOUS

## 2017-01-21 MED ORDER — LABETALOL HCL 5 MG/ML IV SOLN
INTRAVENOUS | Status: AC
  Filled 2017-01-21: qty 4

## 2017-01-21 MED ORDER — OXYBUTYNIN CHLORIDE 5 MG PO TABS
5.0000 mg | ORAL_TABLET | Freq: Three times a day (TID) | ORAL | Status: DC | PRN
  Administered 2017-01-21: 5 mg via ORAL
  Filled 2017-01-21 (×2): qty 1

## 2017-01-21 MED ORDER — OXYCODONE HCL 5 MG PO TABS
ORAL_TABLET | ORAL | Status: AC
  Filled 2017-01-21: qty 1

## 2017-01-21 MED ORDER — SODIUM CHLORIDE 0.9 % IV SOLN
INTRAVENOUS | Status: DC
  Administered 2017-01-21: 10:00:00 via INTRAVENOUS

## 2017-01-21 MED ORDER — OXYCODONE HCL 5 MG PO TABS
5.0000 mg | ORAL_TABLET | Freq: Once | ORAL | Status: AC | PRN
  Administered 2017-01-21: 5 mg via ORAL

## 2017-01-21 MED ORDER — FENTANYL CITRATE (PF) 100 MCG/2ML IJ SOLN
INTRAMUSCULAR | Status: AC
  Filled 2017-01-21: qty 2

## 2017-01-21 MED ORDER — ROCURONIUM BROMIDE 100 MG/10ML IV SOLN
INTRAVENOUS | Status: DC | PRN
  Administered 2017-01-21: 40 mg via INTRAVENOUS

## 2017-01-21 MED ORDER — SUGAMMADEX SODIUM 200 MG/2ML IV SOLN
INTRAVENOUS | Status: AC
  Filled 2017-01-21: qty 2

## 2017-01-21 MED ORDER — PROPOFOL 10 MG/ML IV BOLUS
INTRAVENOUS | Status: AC
  Filled 2017-01-21: qty 20

## 2017-01-21 MED ORDER — OXYBUTYNIN CHLORIDE 5 MG PO TABS: 5.0000 mg | ORAL_TABLET | Freq: Three times a day (TID) | ORAL | 0 refills | Status: DC | PRN

## 2017-01-21 MED ORDER — ONDANSETRON HCL 4 MG/2ML IJ SOLN
INTRAMUSCULAR | Status: AC
  Filled 2017-01-21: qty 2

## 2017-01-21 MED ORDER — TAMSULOSIN HCL 0.4 MG PO CAPS: 0.4000 mg | ORAL_CAPSULE | Freq: Every day | ORAL | 0 refills | Status: AC

## 2017-01-21 MED ORDER — MIDAZOLAM HCL 2 MG/2ML IJ SOLN
INTRAMUSCULAR | Status: DC | PRN
Start: 2017-01-21 — End: 2017-01-21
  Administered 2017-01-21: 2 mg via INTRAVENOUS

## 2017-01-21 MED ORDER — PROPOFOL 10 MG/ML IV BOLUS
INTRAVENOUS | Status: DC | PRN
  Administered 2017-01-21: 150 mg via INTRAVENOUS

## 2017-01-21 MED ORDER — DEXAMETHASONE SODIUM PHOSPHATE 10 MG/ML IJ SOLN
INTRAMUSCULAR | Status: AC
  Filled 2017-01-21: qty 1

## 2017-01-21 MED ORDER — PROMETHAZINE HCL 25 MG/ML IJ SOLN: 6.2500 mg | INTRAMUSCULAR | Status: DC | PRN

## 2017-01-21 MED ORDER — IOTHALAMATE MEGLUMINE 43 % IV SOLN
INTRAVENOUS | Status: DC | PRN
  Administered 2017-01-21: 15 mL

## 2017-01-21 SURGICAL SUPPLY — 34 items
BAG DRAIN CYSTO-URO LG1000N (MISCELLANEOUS) ×3 IMPLANT
BASKET ZERO TIP 1.9FR (BASKET) ×3 IMPLANT
BRUSH SCRUB EZ 1% IODOPHOR (MISCELLANEOUS) ×3 IMPLANT
BSKT STON RTRVL ZERO TP 1.9FR (BASKET) ×2
CATH ROBINSON RED A/P 16FR (CATHETERS) ×3 IMPLANT
CATH URETL 5X70 OPEN END (CATHETERS) ×3 IMPLANT
CNTNR SPEC 2.5X3XGRAD LEK (MISCELLANEOUS)
CONRAY 43 FOR UROLOGY 50M (MISCELLANEOUS) ×3 IMPLANT
CONT SPEC 4OZ STER OR WHT (MISCELLANEOUS)
CONT SPEC 4OZ STRL OR WHT (MISCELLANEOUS)
CONTAINER SPEC 2.5X3XGRAD LEK (MISCELLANEOUS) IMPLANT
DRAPE UTILITY 15X26 TOWEL STRL (DRAPES) ×3 IMPLANT
FIBER LASER LITHO 273 (Laser) ×3 IMPLANT
GLOVE BIO SURGEON STRL SZ 6.5 (GLOVE) ×3 IMPLANT
GOWN STRL REUS W/ TWL LRG LVL3 (GOWN DISPOSABLE) ×4 IMPLANT
GOWN STRL REUS W/TWL LRG LVL3 (GOWN DISPOSABLE) ×6
GUIDEWIRE GREEN .038 145CM (MISCELLANEOUS) ×3 IMPLANT
GUIDEWIRE SUPER STIFF (WIRE) IMPLANT
INFUSOR MANOMETER BAG 3000ML (MISCELLANEOUS) ×3 IMPLANT
INTRODUCER DILATOR DOUBLE (INTRODUCER) IMPLANT
KIT RM TURNOVER CYSTO AR (KITS) ×3 IMPLANT
PACK CYSTO AR (MISCELLANEOUS) ×3 IMPLANT
SCRUB POVIDONE IODINE 4 OZ (MISCELLANEOUS) ×3 IMPLANT
SENSORWIRE 0.038 NOT ANGLED (WIRE) ×6
SET CYSTO W/LG BORE CLAMP LF (SET/KITS/TRAYS/PACK) ×3 IMPLANT
SHEATH URETERAL 12FRX35CM (MISCELLANEOUS) IMPLANT
SOL .9 NS 3000ML IRR  AL (IV SOLUTION) ×1
SOL .9 NS 3000ML IRR AL (IV SOLUTION) ×2
SOL .9 NS 3000ML IRR UROMATIC (IV SOLUTION) ×2 IMPLANT
STENT URET 6FRX24 CONTOUR (STENTS) ×6 IMPLANT
STENT URET 6FRX26 CONTOUR (STENTS) IMPLANT
SURGILUBE 2OZ TUBE FLIPTOP (MISCELLANEOUS) ×3 IMPLANT
WATER STERILE IRR 1000ML POUR (IV SOLUTION) ×3 IMPLANT
WIRE SENSOR 0.038 NOT ANGLED (WIRE) ×4 IMPLANT

## 2017-01-21 NOTE — Anesthesia Procedure Notes (Signed)
Procedure Name: Intubation Date/Time: 01/21/2017 11:56 AM Performed by: Jonna Clark Pre-anesthesia Checklist: Patient identified, Patient being monitored, Timeout performed, Emergency Drugs available and Suction available Patient Re-evaluated:Patient Re-evaluated prior to inductionOxygen Delivery Method: Circle system utilized Preoxygenation: Pre-oxygenation with 100% oxygen Intubation Type: IV induction Ventilation: Mask ventilation without difficulty Laryngoscope Size: Mac and 3 Grade View: Grade II Tube type: Oral Tube size: 7.0 mm Number of attempts: 1 Placement Confirmation: ETT inserted through vocal cords under direct vision,  positive ETCO2 and breath sounds checked- equal and bilateral Secured at: 21 cm Tube secured with: Tape Dental Injury: Teeth and Oropharynx as per pre-operative assessment

## 2017-01-21 NOTE — Interval H&P Note (Signed)
History and Physical Interval Note:  01/21/2017 11:40 AM  Abigail [name]  has presented today for surgery, with the diagnosis of left ureteral stone  The various methods of treatment have been discussed with the patient and family. After consideration of risks, benefits and other options for treatment, the patient has consented to  Procedure(s): CYSTOSCOPY/URETEROSCOPY/HOLMIUM LASER/STENT PLACEMENT (Left) CYSTOSCOPY WITH RETROGRADE PYELOGRAM (Right) as a surgical intervention .  The patient's history has been reviewed, patient examined, no change in status, stable for surgery.  I have reviewed the patient's chart and labs.  Questions were answered to the patient's satisfaction.    RRR CTAB  Plan to treat left-sided stone. Additionally, I'll perform right-sided retrograde and if it appears the stone is still retained, we'll proceed with right ureteroscopy as well. The patient is agreeable with this plan.  Hollice Espy

## 2017-01-21 NOTE — Anesthesia Post-op Follow-up Note (Cosign Needed)
Anesthesia QCDR form completed.        

## 2017-01-21 NOTE — Anesthesia Postprocedure Evaluation (Signed)
Anesthesia Post Note  Patient: Abigail [name]  Procedure(s) Performed: Procedure(s) (LRB): CYSTOSCOPY/URETEROSCOPY/HOLMIUM LASER/STENT PLACEMENT (Left) CYSTOSCOPY WITH RETROGRADE PYELOGRAM (Right)  Patient location during evaluation: PACU Anesthesia Type: General Level of consciousness: awake and alert and oriented Pain management: pain level controlled Vital Signs Assessment: post-procedure vital signs reviewed and stable Respiratory status: spontaneous breathing, nonlabored ventilation and respiratory function stable Cardiovascular status: blood pressure returned to baseline and stable Postop Assessment: no signs of nausea or vomiting Anesthetic complications: no     Last Vitals:  Vitals:   01/21/17 1335 01/21/17 1340  BP:  (!) 151/58  Pulse: 76 79  Resp: 19 20  Temp:      Last Pain:  Vitals:   01/21/17 0957  TempSrc: Oral                 [name]

## 2017-01-21 NOTE — Op Note (Signed)
Date of procedure: 01/21/17  Preoperative diagnosis:  1. Bilateral ureteral calculi   Postoperative diagnosis:  1. Same as above   Procedure: 1. Bilateral ureteroscopy 2. Bilateral retrograde pyelogram 3. Left laser lithotripsy/basket extraction of Stone fragment 4. Bilateral ureteral stent placement  Surgeon: Hollice Espy, MD  Anesthesia: General  Complications: None  Intraoperative findings: 8 mm left proximal ureteral stone partially embedded into ureteral wall, impacted. Right proximal ureteral dilation concerning for retained stone, perinephric/ periureteral proximal ureteral extravasation on retrograde prior to any manipulation.  No stone identified.  EBL: Minimal  Specimens: Left ureteral stone fragment  Drains: 6 x 24 French double-J ureteral stent 2  Indication: Abigail [name] is a 66 y.o. patient with with bilateral ureteral stones, left greater than right with severe left flank pain.  After reviewing the management options for treatment, he elected to proceed with the above surgical procedure(s). We have discussed the potential benefits and risks of the procedure, side effects of the proposed treatment, the likelihood of the patient achieving the goals of the procedure, and any potential problems that might occur during the procedure or recuperation. Informed consent has been obtained.  Description of procedure:  The patient was taken to the operating room and general anesthesia was induced.  The patient was placed in the dorsal lithotomy position, prepped and draped in the usual sterile fashion, and preoperative antibiotics were administered. A preoperative time-out was performed.   A 21 French cystoscope was advanced per urethra into the bladder. Of note, she had fairly significant prolapse.  A 5 French open-ended ureteral catheter was placed just within the left UO. A gentle retrograde pyelogram was performed which showed mild left hydronephrosis with proximal  dilation to the proximal ureter concerning for retained stone on the side. It was not able to be appreciated on scout films. A sensor wire was then placed up to level kidney without difficulty. 4.5 semirigid ureteroscope was then able to be advanced all the way up to the transition point where the stone was encountered. It was irregular shape and partially embedded into the anterior and medial wall of the ureter.  A 273  laser fiber was then brought in and using the settings of 0.8 J and 10 Hz, the stone was fragmented into very small pieces. These were then extracted using a 1.9 [place] nitinol basket. Several of the stones were sent as stone fragments. I was able to advance the scope all the way up to the renal pelvis and no residual stone fragments were appreciated. The scope was then carefully backed down the length of the ureter inspecting along the way. There were no ureteral injuries or residual stone fragments appreciated. The wire was left in place as a safety wire at the very end of the case, a 6 x 24 French double-J ureteral stent was placed over the wire up to level of the renal pelvis. The wire was partially drawn until full coil was noted within the renal pelvis. The wire was then fully withdrawn and a full coil was noted within the bladder.  Next, using the semirigid ureteroscope, the tip of the scope was advanced just within the right UO at which time a gentle retrograde pyelogram was performed on this side. This revealed some narrowing at the proximal ureter where the stone had been seen on CT scan. There are some mild dilation of the collecting system as well as some extravasation of contrast both perinephric and within the proximal ureter. This was prior to any  manipulation. Due to concern for retained stone on this side, I did go ahead and place a sensor wire up to level of the kidney. This is In place as a safety wire. 4.5 French semirigid ureteroscope was then advanced up to level of the mid  ureter but was unable to pass it beyond this level. A second Super Stiff wire was then passed up to level of the kidney.  A 7 French flexible ureteroscope was then advanced all the way up to level of the kidney beyond the area of the suspected stone. Formal pyeloscopy was then performed which revealed no stones or stone fragments within the calyces or renal pelvis. With manipulation, additional extravasation was seen by was unclear of the actual location of sources no injury was appreciated. The scope was then backed through the UPJ down the length of the ureter inspecting along the way. There was a small ureteral abrasion with approximately ureter appreciated for presumed passage of the wire or scope, but otherwise no obstructing lesions or transfusions identified traveling through ureter. Given the presence of a extravasation, I did go ahead and place a 6 x 24 French double-J ureteral stent over the safety wire up to level of the renal pelvis. The wire was partially drawn until full coil was noted within the renal pelvis. The wire was then fully withdrawn and a full coil was noted within the bladder. The bladder was then drained using a 60 French red rubber catheter. The patient was then cleaned and dried, repositioned supine position, reversed anesthesia, taken the PACU in stable condition.  Plan: The patient will follow-up next week for cystoscopy, bilateral stent removal.  Hollice Espy, M.D.

## 2017-01-21 NOTE — Discharge Instructions (Signed)
You have a ureteral stent in place.  This is a tube that extends from your kidney to your bladder.  This may cause urinary bleeding, burning with urination, and urinary frequency.  Please call our office or present to the ED if you develop fevers >101 or pain which is not able to be controlled with oral pain medications.  You may be given either Flomax and/ or ditropan to help with bladder spasms and stent pain in addition to pain medications.   ° °[facility] °[address], [address] °[place], [place] [address] °[phone] °

## 2017-01-21 NOTE — Transfer of Care (Signed)
Immediate Anesthesia Transfer of Care Note  Patient: Abigail [name]  Procedure(s) Performed: Procedure(s): CYSTOSCOPY/URETEROSCOPY/HOLMIUM LASER/STENT PLACEMENT (Left) CYSTOSCOPY WITH RETROGRADE PYELOGRAM (Right)  Patient Location: PACU  Anesthesia Type:General  Level of Consciousness: awake, alert  and oriented  Airway & Oxygen Therapy: Patient Spontanous Breathing and Patient connected to nasal cannula oxygen  Post-op Assessment: Report given to RN and Post -op Vital signs reviewed and stable  Post vital signs: Reviewed and stable  Last Vitals:  Vitals:   01/21/17 0957  BP: (!) 170/75  Pulse: 72  Resp: 16  Temp: 36.8 C    Last Pain:  Vitals:   01/21/17 0957  TempSrc: Oral         Complications: No apparent anesthesia complications

## 2017-01-21 NOTE — H&P (View-Only) (Signed)
01/16/2017 9:56 AM   Abigail [name] [birth year] [id number]  Referring provider: Alycia Rossetti, MD [address] [place], [place] [address]  Chief Complaint  Patient presents with  . Nephrolithiasis    New Patient    HPI: 66 year old female who presents today for evaluation of bilateral ureteral calculi. She was seen in the emergency room on 01/14/2017 with acute onset bilateral flank pain.  At that time, she was found to have 8 x 5 mm left UVJ calculus as well as a 3 mm right proximal ureteral calculus. There were no additional intrarenal calculi appreciated. She had no evidence of leukocytosis, acute kidney injury, or UTI. Her pain was able to be controlled and she was discharged home with urologic follow-up.  She continues to have left-sided flank pain. She has been able to tolerate fluids. No fevers or chills.  No dysuria or gross hematuria.  She denies previous history of kidney stones.   PMH: Past Medical History:  Diagnosis Date  . Arthritis   . Cancer ([place])   . COPD (chronic obstructive pulmonary disease) ([place])   . Diabetes mellitus ([place])    a. A1c 6.6 in 05/2013 indicating new dx.  . Fatty liver   . Female stress incontinence   . GERD (gastroesophageal reflux disease)   . Heart murmur   . Hemorrhoids   . Hyperlipidemia   . Hyperplastic colon polyp 06/07/2008  . Hypertension   . Hypertriglyceridemia   . IBS (irritable bowel syndrome)   . Lung nodule    a. 7m left lung nodule by CT 05/2013.  . Obesity   . Psoriasis   . Skin cancer     Surgical History: Past Surgical History:  Procedure Laterality Date  . CATARACT EXTRACTION W/PHACO Left 10/06/2014   Procedure: CATARACT EXTRACTION PHACO AND INTRAOCULAR LENS PLACEMENT LEFT EYE;  Surgeon: KTonny Branch MD;  Location: [facility];  Service: Ophthalmology;  Laterality: Left;  CDE 7.35  . CATARACT EXTRACTION W/PHACO Right 11/14/2014   Procedure: CATARACT EXTRACTION PHACO AND INTRAOCULAR LENS PLACEMENT RIGHT EYE;   Surgeon: KTonny Branch MD;  Location: [facility];  Service: Ophthalmology;  Laterality: Right;  CDE:6.39  . DORSAL COMPARTMENT RELEASE Left 06/15/2015   Procedure: LEFT WRIST DEQUERVAINS;  Surgeon: DNinetta Lights MD;  [facility]  Service: Orthopedics;  Laterality: Left;  . HERNIA REPAIR    . MOUTH SURGERY    . NASAL SINUS SURGERY    . SHOULDER SURGERY     right  . TONSILLECTOMY    . VENTRAL HERNIA REPAIR      Home Medications:  Allergies as of 01/16/2017   No Known Allergies     Medication List       Accurate as of 01/16/17 11:59 PM. Always use your most recent med list.          aspirin 81 MG tablet Take 81 mg by mouth daily.   fluticasone 50 MCG/ACT nasal spray Commonly known as:  FLONASE 2 sprays daily.   gabapentin 300 MG capsule Commonly known as:  NEURONTIN Take Three Capsules  (900 mg) by Mouth Three (3) Times Daily.   glucose monitoring kit monitoring kit 1 each by Does not apply route as needed for other. Dispense one glucometer of choice, testing strips/lancets for once per day glucose checks.  QS for 1 month, 11 refills.   HUMIRA 40 MG/0.8ML Pskt Generic drug:  Adalimumab Inject 40 mg into the skin every 14 (fourteen) days.  hyoscyamine 0.125 MG SL tablet Commonly known as:  LEVSIN/SL Place 1 tablet (0.125 mg total) under the tongue every 6 (six) hours as needed.   ibuprofen 200 MG tablet Commonly known as:  ADVIL,MOTRIN Take 600 mg by mouth daily as needed for headache or moderate pain.   lisinopril-hydrochlorothiazide 10-12.5 MG tablet Commonly known as:  PRINZIDE,ZESTORETIC Take 1 tablet by mouth daily.   metFORMIN 500 MG tablet Commonly known as:  GLUCOPHAGE Take 1 tablet (500 mg total) by mouth daily with breakfast. PATIENT NEEDS OFFICE VISIT FOR ADDITIONAL REFILLS   mupirocin ointment 2 % Commonly known as:  BACTROBAN Apply to wound twice a day.   ondansetron 4 MG tablet Commonly known as:  ZOFRAN Take 1 tablet (4  mg total) by mouth every 8 (eight) hours as needed for nausea or vomiting.   oxyCODONE-acetaminophen 5-325 MG tablet Commonly known as:  ROXICET Take 1 tablet by mouth every 6 (six) hours as needed.   tamsulosin 0.4 MG Caps capsule Commonly known as:  FLOMAX Take 1 capsule (0.4 mg total) by mouth daily.       Allergies: No Known Allergies  Family History: Family History  Problem Relation Age of Onset  . Breast cancer Mother   . Cancer Mother   . Heart disease Maternal Grandfather   . Colon polyps Maternal Aunt     Social History:  reports that she has never smoked. She has never used smokeless tobacco. She reports that she does not drink alcohol or use drugs.  ROS: UROLOGY Frequent Urination?: Yes Hard to postpone urination?: Yes Burning/pain with urination?: No Get up at night to urinate?: Yes Leakage of urine?: Yes Urine stream starts and stops?: No Trouble starting stream?: No Do you have to strain to urinate?: No Blood in urine?: No Urinary tract infection?: No Sexually transmitted disease?: No Injury to kidneys or bladder?: No Painful intercourse?: No Weak stream?: No Currently pregnant?: No Vaginal bleeding?: No Last menstrual period?: n  Gastrointestinal Nausea?: Yes Vomiting?: Yes Indigestion/heartburn?: No Diarrhea?: Yes Constipation?: No  Constitutional Fever: No Night sweats?: No Weight loss?: No Fatigue?: No  Skin Skin rash/lesions?: No Itching?: No  Eyes Blurred vision?: Yes Double vision?: No  Ears/Nose/Throat Sore throat?: No Sinus problems?: Yes  Hematologic/Lymphatic Swollen glands?: No Easy bruising?: No  Cardiovascular Leg swelling?: No Chest pain?: No  Respiratory Cough?: Yes Shortness of breath?: Yes  Endocrine Excessive thirst?: No  Musculoskeletal Back pain?: No Joint pain?: Yes  Neurological Headaches?: No Dizziness?: No  Psychologic Depression?: No Anxiety?: No  Physical Exam: BP (!) 166/85    Pulse 85   Ht _0  (1.575 m)   Wt 202 lb (91.6 kg)   BMI 36.95 kg/m   Constitutional:  Alert and oriented, No acute distress. HEENT: [place] AT, moist mucus membranes.  Trachea midline, no masses. Cardiovascular: No clubbing, cyanosis, or edema. Respiratory: Normal respiratory effort, no increased work of breathing. GI: Abdomen is soft, nontender, nondistended, no abdominal masses GU: No CVA tenderness.  Skin: No rashes, bruises or suspicious lesions. Lymph: No cervical or inguinal adenopathy. Neurologic: Grossly intact, no focal deficits, moving all 4 extremities. Psychiatric: Normal mood and affect.  Laboratory Data: Lab Results  Component Value Date   WBC 8.1 01/18/2017   HGB 13.2 01/18/2017   HCT 37.9 01/18/2017   MCV 86.5 01/18/2017   PLT 169 01/18/2017    Lab Results  Component Value Date   CREATININE 0.60 01/18/2017    Lab Results  Component Value Date  HGBA1C 6.3 05/12/2014    Urinalysis Component     Latest Ref Rng & Units 01/14/2017  Color, Urine     YELLOW YELLOW (A)  Appearance     CLEAR CLOUDY (A)  Specific Gravity, Urine     1.005 - 1.030 1.018  pH     5.0 - 8.0 5.0  Glucose     NEGATIVE mg/dL NEGATIVE  Hgb urine dipstick     NEGATIVE LARGE (A)  Bilirubin Urine     NEGATIVE NEGATIVE  Ketones, ur     NEGATIVE mg/dL NEGATIVE  Protein     NEGATIVE mg/dL 30 (A)  Nitrite     NEGATIVE NEGATIVE  Leukocytes, UA     NEGATIVE NEGATIVE  RBC / HPF     0 - 5 RBC/hpf TOO NUMEROUS TO COUNT  WBC, UA     0 - 5 WBC/hpf 0-5  Bacteria, UA     NONE SEEN NONE SEEN  Squamous Epithelial / LPF     NONE SEEN 0-5 (A)  Mucous      PRESENT    Pertinent Imaging: CLINICAL DATA:  66 year old female left flank pain acute onset today. Subsequent vomiting. Initial encounter.  EXAM: CT ABDOMEN AND PELVIS WITHOUT CONTRAST  TECHNIQUE: Multidetector CT imaging of the abdomen and pelvis was performed following the standard protocol without IV  contrast.  COMPARISON:  [place] CT Abdomen and Pelvis 06/03/2005  FINDINGS: Lower chest: Chronic elevation of the right hemidiaphragm. Mildly lower lung volumes today. Similar lung base atelectasis. No pleural effusion. Mild cardiomegaly. No pericardial effusion.  Hepatobiliary: Chronic hepatic steatosis. Mild fatty sparing at the gallbladder fossa. Negative gallbladder. No biliary ductal enlargement.  Pancreas: Negative.  Spleen: Negative.  Adrenals/Urinary Tract: Chronic left adrenal adenoma has not significantly changed since 2006 and measures -0.2 Hounsfield units. Normal right adrenal gland.  Mild to moderate right hydronephrosis with a 3 mm proximal right ureteral calculus located about 2 cm from the right ureteral pelvic junction as seen on coronal image 101. Distal to this calculus the right ureter is normal to the urinary bladder.  The urinary bladder is diminutive and unremarkable.  There is also mild to moderate left hydronephrosis, with mild left perinephric and proximal left periureteral stranding which is greater than that on the right. There is a left read row pelvic junction level calculus within irregular-shaped measuring 5 x 8 mm. See coronal image 93. Distal to this calculus the left ureter appears normal to the UVJ.  There are small pelvic phleboliths. No intrarenal calculi identified.  No abdominal free fluid.  Stomach/Bowel: Retained stool in the rectum. Mostly decompressed sigmoid colon with some redundancy. Occasional sigmoid diverticula without active inflammation. Negative left colon. Retained stool in the transverse and right colon. Normal appendix. Negative terminal ileum. No dilated small bowel. Decompressed and negative stomach. Negative duodenum aside from a 2.7 cm duodenum diverticulum at the third portion (series 2, image 40).  Vascular/Lymphatic: Calcified aortic atherosclerosis. Vascular patency is not  evaluated in the absence of IV contrast.  Reproductive: Negative noncontrast uterus and adnexa.  Other: No pelvic free fluid. Previous ventral hernia repair with mesh. Small fat containing right inguinal hernia. Small fat containing umbilical hernia. Second small midline fat containing hernia or diastases located about 7 cm cephalad of the umbilicus (sagittal image 106).  Musculoskeletal: Osteopenia. Mild lumbar scoliosis. No acute osseous abnormality identified.  IMPRESSION: 1. Bilateral obstructive uropathy worse on the left. Left UVJ 5 x 8 mm calculus. Right proximal ureter 3  mm calculus. No intrarenal calculi identified. 2.  Calcified aortic atherosclerosis. 3. Chronic fatty liver disease. 4. Previous ventral abdominal hernia repair with mesh.   Electronically Signed   By: Genevie Ann M.D.   On: 01/14/2017 20:12  Assessment & Plan:    1. Left ureteral stone 8 mm left UPJ stone, symptomatic without signs or symptoms of infection. We discussed the given the size of the stone, she has approximate 50% chance of passing it spontaneously with medical expulsive therapy.  We discussed various treatment options including ESWL vs. ureteroscopy, laser lithotripsy, and stent. We discussed the risks and benefits of both including bleeding, infection, damage to surrounding structures, efficacy with need for possible further intervention, and need for temporary ureteral stent.   She is most interested shockwave lithotripsy. She was sent over following clinic for KUB in the stone was not able to be appreciated.    We will arrange for follow-up renal ultrasound to assess whether or not there is residual hydronephrosis. In this situation, she may consider proceeding with left ureteroscopy versus medical expulsive therapy.  All of her answer. We will call the patient with her renal ultrasound results.  Warning symptoms for urgent medical attention were reviewed today in detail.  This  includes low or diminished urine output.  - Urinalysis, Complete - Abdomen 1 view (KUB); Future  2. Right ureteral stone 3 mm right proximal ureteral stone.  Based on the size of the stone, she will likely pass this spontaneously. Continue Flomax  3. Bilateral hydronephrosis Secondary to bilateral stones, renal function preserved therefore likely only partially obstructive   Will call with renal ultrasound results  Hollice Espy, MD  [facility] [address], [place], [place] [address] [phone]

## 2017-01-21 NOTE — Anesthesia Preprocedure Evaluation (Signed)
Anesthesia Evaluation  Patient identified by MRN, date of birth, ID band Patient awake    Reviewed: Allergy & Precautions, NPO status , Patient's Chart, lab work & pertinent test results  History of Anesthesia Complications Negative for: history of anesthetic complications  Airway Mallampati: II  TM Distance: >3 FB Neck ROM: Full    Dental  (+) Poor Dentition   Pulmonary neg sleep apnea, COPD,    breath sounds clear to auscultation- rhonchi (-) wheezing      Cardiovascular hypertension, Pt. on medications (-) CAD and (-) Past MI  Rhythm:Regular Rate:Normal - Systolic murmurs and - Diastolic murmurs    Neuro/Psych negative neurological ROS  negative psych ROS   GI/Hepatic Neg liver ROS, PUD, GERD  ,  Endo/Other  diabetes, Oral Hypoglycemic Agents  Renal/GU negative Renal ROS     Musculoskeletal  (+) Arthritis ,   Abdominal (+) + obese,   Peds  Hematology negative hematology ROS (+)   Anesthesia Other Findings Past Medical History: No date: Arthritis No date: Cancer ([facility]) No date: COPD (chronic obstructive pulmonary disease) (* No date: Diabetes mellitus ([place])     Comment: a. A1c 6.6 in 05/2013 indicating new dx. No date: Fatty liver No date: Female stress incontinence No date: GERD (gastroesophageal reflux disease) No date: Heart murmur No date: Hemorrhoids No date: Hyperlipidemia 06/07/2008: Hyperplastic colon polyp No date: Hypertension No date: Hypertriglyceridemia No date: IBS (irritable bowel syndrome) No date: Lung nodule     Comment: a. 31mm left lung nodule by CT 05/2013. No date: Obesity No date: Psoriasis No date: Skin cancer   Reproductive/Obstetrics                             Anesthesia Physical Anesthesia Plan  ASA: III  Anesthesia Plan: General   Post-op Pain Management:    Induction: Intravenous  Airway Management Planned: Oral ETT  Additional  Equipment:   Intra-op Plan:   Post-operative Plan: Extubation in OR  Informed Consent: I have reviewed the patients History and Physical, chart, labs and discussed the procedure including the risks, benefits and alternatives for the proposed anesthesia with the patient or authorized representative who has indicated his/her understanding and acceptance.   Dental advisory given  Plan Discussed with: CRNA and Anesthesiologist  Anesthesia Plan Comments:         Anesthesia Quick Evaluation

## 2017-01-22 ENCOUNTER — Encounter: Payer: Self-pay | Admitting: Urology

## 2017-01-23 ENCOUNTER — Ambulatory Visit: Admit: 2017-01-23 | Payer: Medicare Other | Admitting: Urology

## 2017-01-23 SURGERY — LITHOTRIPSY, ESWL
Anesthesia: Moderate Sedation | Laterality: Left

## 2017-01-27 ENCOUNTER — Telehealth: Payer: Self-pay

## 2017-01-27 NOTE — Telephone Encounter (Signed)
Pt called requesting more pain medication for bladder spasms associated with stents. Reinforced with pt narcotics do not help with bladder spasms. Reinforced with pt to take oxybutynin, tylenol/ibuprofen, and can apply heat to the area. Pt voiced understanding.

## 2017-01-28 ENCOUNTER — Ambulatory Visit (INDEPENDENT_AMBULATORY_CARE_PROVIDER_SITE_OTHER): Payer: Medicare Other | Admitting: Urology

## 2017-01-28 DIAGNOSIS — N201 Calculus of ureter: Secondary | ICD-10-CM

## 2017-01-28 DIAGNOSIS — N3289 Other specified disorders of bladder: Secondary | ICD-10-CM | POA: Diagnosis not present

## 2017-01-28 DIAGNOSIS — N200 Calculus of kidney: Secondary | ICD-10-CM | POA: Diagnosis not present

## 2017-01-28 DIAGNOSIS — N3000 Acute cystitis without hematuria: Secondary | ICD-10-CM | POA: Diagnosis not present

## 2017-01-28 MED ORDER — SULFAMETHOXAZOLE-TRIMETHOPRIM 800-160 MG PO TABS: 1.0000 | ORAL_TABLET | Freq: Two times a day (BID) | ORAL | 0 refills | Status: DC

## 2017-01-28 NOTE — Progress Notes (Signed)
01/28/2017 4:18 PM   Abigail [name] [birth year] [id number]  Referring provider: Alycia Rossetti, MD [address] [place], [place] [address]  Chief Complaint  Patient presents with  . Cysto Stent Removal    HPI: 66 year old female with bilateral obstructing ureteral calculi status post bilateral ureteroscopy on 01/21/2017 who returns today for cystoscopy, stent removal. Intraoperatively, she was found to have an 8 mm left proximal ureteral stone which was partially embedded into the mucosa. Right ureteroscopy was negative but she did have an abnormal retrograde on the side with proximal ureteral extravasation of unclear etiology prior to any instrumentation.  She does report that she's had a significant amount of bladder pressure, pain, and urgency. She is also had some dysuria. No fevers or chills.  UA was unremarkable, urine culture with mixed colonies. She was not discharged home on antibiotics per guidelines.   PMH: Past Medical History:  Diagnosis Date  . Arthritis   . Cancer ([place])   . COPD (chronic obstructive pulmonary disease) ([place])   . Diabetes mellitus ([place])    a. A1c 6.6 in 05/2013 indicating new dx.  . Fatty liver   . Female stress incontinence   . GERD (gastroesophageal reflux disease)   . Heart murmur   . Hemorrhoids   . Hyperlipidemia   . Hyperplastic colon polyp 06/07/2008  . Hypertension   . Hypertriglyceridemia   . IBS (irritable bowel syndrome)   . Lung nodule    a. 44m left lung nodule by CT 05/2013.  . Obesity   . Psoriasis   . Skin cancer     Surgical History: Past Surgical History:  Procedure Laterality Date  . CATARACT EXTRACTION W/PHACO Left 10/06/2014   Procedure: CATARACT EXTRACTION PHACO AND INTRAOCULAR LENS PLACEMENT LEFT EYE;  Surgeon: KTonny Branch MD;  Location: [facility];  Service: Ophthalmology;  Laterality: Left;  CDE 7.35  . CATARACT EXTRACTION W/PHACO Right 11/14/2014   Procedure: CATARACT EXTRACTION PHACO AND INTRAOCULAR LENS  PLACEMENT RIGHT EYE;  Surgeon: KTonny Branch MD;  Location: [facility];  Service: Ophthalmology;  Laterality: Right;  CDE:6.39  . CYSTOSCOPY W/ RETROGRADES Right 01/21/2017   Procedure: CYSTOSCOPY WITH RETROGRADE PYELOGRAM;  Surgeon: AHollice Espy MD;  Location: [facility];  Service: Urology;  Laterality: Right;  . CYSTOSCOPY/URETEROSCOPY/HOLMIUM LASER/STENT PLACEMENT Left 01/21/2017   Procedure: CYSTOSCOPY/URETEROSCOPY/HOLMIUM LASER/STENT PLACEMENT;  Surgeon: AHollice Espy MD;  Location: [facility];  Service: Urology;  Laterality: Left;  . DORSAL COMPARTMENT RELEASE Left 06/15/2015   Procedure: LEFT WRIST DEQUERVAINS;  Surgeon: DNinetta Lights MD;  Location: [facility]  Service: Orthopedics;  Laterality: Left;  . HERNIA REPAIR    . MOUTH SURGERY    . NASAL SINUS SURGERY    . SHOULDER SURGERY     right  . TONSILLECTOMY    . VENTRAL HERNIA REPAIR      Home Medications:  Allergies as of 01/28/2017      Reactions   Cinnamon    Dry throat       Medication List       Accurate as of 01/28/17  4:18 PM. Always use your most recent med list.          fenofibrate 145 MG tablet Commonly known as:  TRICOR Take 145 mg by mouth at bedtime.   fluticasone 50 MCG/ACT nasal spray Commonly known as:  FLONASE Place 2 sprays into both nostrils daily as needed for allergies.   gabapentin 300 MG capsule Commonly known as:  NEURONTIN  Take Three Capsules  (900 mg) by Mouth Three (3) Times Daily.   glucose monitoring kit monitoring kit 1 each by Does not apply route as needed for other. Dispense one glucometer of choice, testing strips/lancets for once per day glucose checks.  QS for 1 month, 11 refills.   HUMIRA 40 MG/0.8ML Pskt Generic drug:  Adalimumab Inject 40 mg into the skin every 28 (twenty-eight) days.   hyoscyamine 0.125 MG SL tablet Commonly known as:  LEVSIN/SL Place 1 tablet (0.125 mg total) under the tongue every 6 (six) hours as needed.   ibuprofen 200 MG  tablet Commonly known as:  ADVIL,MOTRIN Take 600 mg by mouth daily as needed for headache or moderate pain.   lisinopril-hydrochlorothiazide 10-12.5 MG tablet Commonly known as:  PRINZIDE,ZESTORETIC Take 1 tablet by mouth at bedtime.   meloxicam 15 MG tablet Commonly known as:  MOBIC Take 15 mg by mouth daily as needed for pain.   metFORMIN 500 MG tablet Commonly known as:  GLUCOPHAGE Take 1 tablet (500 mg total) by mouth daily with breakfast. PATIENT NEEDS OFFICE VISIT FOR ADDITIONAL REFILLS   ondansetron 4 MG tablet Commonly known as:  ZOFRAN Take 1 tablet (4 mg total) by mouth every 8 (eight) hours as needed for nausea or vomiting.   oxybutynin 5 MG tablet Commonly known as:  DITROPAN Take 1 tablet (5 mg total) by mouth every 8 (eight) hours as needed for bladder spasms.   oxyCODONE-acetaminophen 5-325 MG tablet Commonly known as:  ROXICET Take 1 tablet by mouth every 6 (six) hours as needed.   ranitidine 150 MG tablet Commonly known as:  ZANTAC Take 150 mg by mouth daily as needed for heartburn.   sulfamethoxazole-trimethoprim 800-160 MG tablet Commonly known as:  BACTRIM DS,SEPTRA DS Take 1 tablet by mouth every 12 (twelve) hours.       Allergies:  Allergies  Allergen Reactions  . Cinnamon     Dry throat     Family History: Family History  Problem Relation Age of Onset  . Breast cancer Mother   . Cancer Mother   . Heart disease Maternal Grandfather   . Colon polyps Maternal Aunt     Social History:  reports that she has never smoked. She has never used smokeless tobacco. She reports that she does not drink alcohol or use drugs.  ROS: 12 point ROS negative other than as per HPI  Physical Exam: No vitals obtained today. Presents today with husband. Constitutional:  Alert and oriented, No acute distress. HEENT: [place] AT, moist mucus membranes.  Trachea midline, no masses. Cardiovascular: No clubbing, cyanosis, or edema. Respiratory: Normal respiratory  effort, no increased work of breathing. Neurologic: Grossly intact, no focal deficits, moving all 4 extremities. Psychiatric: Normal mood and affect.  Laboratory Data: Lab Results  Component Value Date   WBC 8.1 01/18/2017   HGB 13.2 01/18/2017   HCT 37.9 01/18/2017   MCV 86.5 01/18/2017   PLT 169 01/18/2017    Lab Results  Component Value Date   CREATININE 0.60 01/18/2017    Lab Results  Component Value Date   HGBA1C 6.3 05/12/2014    Urinalysis UA reviewed today, see epic Urine is positive for leukocyte and nitrite which is highly suspicous  Pertinent Imaging: n/a  Assessment & Plan:    1. Bilateral ureteral calculi Status post bilateral ureteroscopy Return today for stent removal, UA highly suspicious for UTI, nitrate positive Will treat as per #2, reschedule for next week Patient is agreeable with this  plan - Urinalysis, Complete - CULTURE, URINE COMPREHENSIVE - CULTURE, URINE COMPREHENSIVE  2. Acute cystitis without hematuria Symptomatic UTI with urgency, frequency, bladder spasms which made partially related to the stent Urine culture sent today Will treat presumptively with Bactrim DS twice a day 7 days, changed as needed based on culture and sensitivity data We will reschedule stent removal for early next week  3. Bladder spasm Minimal relief with Ditropan Patient given 1 week samples of Mybetriq 50 mg 7 days   Schedule cystoscopy, stent removal for next week  She'll need a follow-up renal ultrasound in 4-6 weeks following stent removal  Hollice Espy, MD  [facility] [address], [place], [place] [address] [phone]

## 2017-01-29 ENCOUNTER — Ambulatory Visit: Payer: Medicare Other | Admitting: Family Medicine

## 2017-01-29 LAB — STONE ANALYSIS
CA OXALATE, MONOHYDR.: 70 %
CA PHOS CRY STONE QL IR: 5 %
Ca Oxalate,Dihydrate: 25 %
Stone Weight KSTONE: 9.6 mg

## 2017-01-29 LAB — MICROSCOPIC EXAMINATION
Epithelial Cells (non renal): NONE SEEN /hpf (ref 0–10)
WBC, UA: NONE SEEN /hpf (ref 0–?)

## 2017-01-29 LAB — URINALYSIS, COMPLETE
BILIRUBIN UA: NEGATIVE
NITRITE UA: POSITIVE — AB
PH UA: 8.5 — AB (ref 5.0–7.5)

## 2017-01-30 ENCOUNTER — Ambulatory Visit (INDEPENDENT_AMBULATORY_CARE_PROVIDER_SITE_OTHER): Payer: Medicare Other

## 2017-01-30 ENCOUNTER — Telehealth: Payer: Self-pay | Admitting: Urology

## 2017-01-30 VITALS — BP 135/75 | HR 116 | Ht 62.0 in | Wt 193.4 lb

## 2017-01-30 DIAGNOSIS — N200 Calculus of kidney: Secondary | ICD-10-CM | POA: Diagnosis not present

## 2017-01-30 LAB — CULTURE, URINE COMPREHENSIVE

## 2017-01-30 NOTE — Telephone Encounter (Signed)
Pt was seen on the nurse schedule.

## 2017-01-30 NOTE — Telephone Encounter (Signed)
Patient is calling and asking for something for a yeast infection. She was given an abx and now is getting on from that. She uses [facility] on [address]. She would like a call when this is done so they can go pick it up.   Thanks,  Sharyn Lull

## 2017-01-30 NOTE — Progress Notes (Signed)
Bladder Scan: 200 Patient can void:  Performed By: Toniann Fail, LPN   Pt presents today with c/o not being able to urinate since starting myrbetriq and abx. Pt PVR 200. Spoke with Dr. Pilar Jarvis who stated there isnt much more we can do for the pt. Reinforced with pt to apply heat to the area. Pt requested pain medication. Reinforced with pt narcotics do not help with this type of pain, tylenol and ibuprofen are the best pain relievers. Pt got mad and walked out.   Blood pressure 135/75, pulse (!) 116, height 5\' 2"  (1.575 m), weight 193 lb 6.4 oz (87.7 kg).

## 2017-02-03 ENCOUNTER — Encounter: Payer: Self-pay | Admitting: Urology

## 2017-02-03 ENCOUNTER — Ambulatory Visit (INDEPENDENT_AMBULATORY_CARE_PROVIDER_SITE_OTHER): Payer: Medicare Other | Admitting: Urology

## 2017-02-03 VITALS — BP 111/68 | HR 98 | Ht 62.0 in | Wt 194.7 lb

## 2017-02-03 DIAGNOSIS — N200 Calculus of kidney: Secondary | ICD-10-CM | POA: Diagnosis not present

## 2017-02-03 LAB — MICROSCOPIC EXAMINATION
EPITHELIAL CELLS (NON RENAL): NONE SEEN /HPF (ref 0–10)
RBC, UA: >30 /hpf — ABNORMAL HIGH (ref 0–?)

## 2017-02-03 LAB — URINALYSIS, COMPLETE
Bilirubin, UA: NEGATIVE
Nitrite, UA: POSITIVE — AB
Specific Gravity, UA: 1.015 (ref 1.005–1.030)
Urobilinogen, Ur: 4 mg/dL — ABNORMAL HIGH (ref 0.2–1.0)
pH, UA: 5.5 (ref 5.0–7.5)

## 2017-02-03 NOTE — Progress Notes (Signed)
Patient still on antibiotic from last week from urinary tract infection,  no cipro given today for cystoscopy.

## 2017-02-03 NOTE — Progress Notes (Signed)
02/03/2017 10:47 AM   Abigail [name] 1950/12/16 [id number]  Referring provider: Alycia Rossetti, MD [address] [place], [place] [address]  CC: Cysto stent pull / FU nephrolithiasis  HPI:   1. Nephrolithiasis - s/p BILATERAL ureteroscopic stone manipulation to stone free for bilateral ureteral stones 01/2017. Composition CaOx.   PMH sig or obesity, COPD, IBS / humira, DM2.   Today "Abigail [name]" is seen in f/u above and cysto stent pull. She has had significant stent colic and did not do well with trial of antispasmodics. Most recnet UCX from last week non-clonal.      PMH: Past Medical History:  Diagnosis Date  . Arthritis   . Cancer ([place])   . COPD (chronic obstructive pulmonary disease) ([place])   . Diabetes mellitus ([place])    a. A1c 6.6 in 05/2013 indicating new dx.  . Fatty liver   . Female stress incontinence   . GERD (gastroesophageal reflux disease)   . Heart murmur   . Hemorrhoids   . Hyperlipidemia   . Hyperplastic colon polyp 06/07/2008  . Hypertension   . Hypertriglyceridemia   . IBS (irritable bowel syndrome)   . Lung nodule    a. 53m left lung nodule by CT 05/2013.  . Obesity   . Psoriasis   . Skin cancer     Surgical History: Past Surgical History:  Procedure Laterality Date  . CATARACT EXTRACTION W/PHACO Left 10/06/2014   Procedure: CATARACT EXTRACTION PHACO AND INTRAOCULAR LENS PLACEMENT LEFT EYE;  Surgeon: KTonny Branch MD;  [facility]: [facility];  Service: Ophthalmology;  Laterality: Left;  CDE 7.35  . CATARACT EXTRACTION W/PHACO Right 11/14/2014   Procedure: CATARACT EXTRACTION PHACO AND INTRAOCULAR LENS PLACEMENT RIGHT EYE;  Surgeon: KTonny Branch MD;  Location: [facility];  Service: Ophthalmology;  Laterality: Right;  CDE:6.39  . CYSTOSCOPY W/ RETROGRADES Right 01/21/2017   Procedure: CYSTOSCOPY WITH RETROGRADE PYELOGRAM;  Surgeon: AHollice Espy MD;  Location: [facility];  Service: Urology;  Laterality: Right;  . CYSTOSCOPY/URETEROSCOPY/HOLMIUM LASER/STENT  PLACEMENT Left 01/21/2017   Procedure: CYSTOSCOPY/URETEROSCOPY/HOLMIUM LASER/STENT PLACEMENT;  Surgeon: AHollice Espy MD;  Location: [facility];  Service: Urology;  Laterality: Left;  . DORSAL COMPARTMENT RELEASE Left 06/15/2015   Procedure: LEFT WRIST DEQUERVAINS;  Surgeon: DNinetta Lights MD;  [facility]  Service: Orthopedics;  Laterality: Left;  . HERNIA REPAIR    . MOUTH SURGERY    . NASAL SINUS SURGERY    . SHOULDER SURGERY     right  . TONSILLECTOMY    . VENTRAL HERNIA REPAIR      Home Medications:  Allergies as of 02/03/2017      Reactions   Cinnamon    Dry throat       Medication List       Accurate as of 02/03/17 10:47 AM. Always use your most recent med list.          fenofibrate 145 MG tablet Commonly known as:  TRICOR Take 145 mg by mouth at bedtime.   fluticasone 50 MCG/ACT nasal spray Commonly known as:  FLONASE Place 2 sprays into both nostrils daily as needed for allergies.   gabapentin 300 MG capsule Commonly known as:  NEURONTIN Take Three Capsules  (900 mg) by Mouth Three (3) Times Daily.   glucose monitoring kit monitoring kit 1 each by Does not apply route as needed for other. Dispense one glucometer of choice, testing strips/lancets for once per day glucose checks.  QS for 1  month, 11 refills.   HUMIRA 40 MG/0.8ML Pskt Generic drug:  Adalimumab Inject 40 mg into the skin every 28 (twenty-eight) days.   hyoscyamine 0.125 MG SL tablet Commonly known as:  LEVSIN/SL Place 1 tablet (0.125 mg total) under the tongue every 6 (six) hours as needed.   ibuprofen 200 MG tablet Commonly known as:  ADVIL,MOTRIN Take 600 mg by mouth daily as needed for headache or moderate pain.   lisinopril-hydrochlorothiazide 10-12.5 MG tablet Commonly known as:  PRINZIDE,ZESTORETIC Take 1 tablet by mouth at bedtime.   meloxicam 15 MG tablet Commonly known as:  MOBIC Take 15 mg by mouth daily as needed for pain.   metFORMIN 500 MG  tablet Commonly known as:  GLUCOPHAGE Take 1 tablet (500 mg total) by mouth daily with breakfast. PATIENT NEEDS OFFICE VISIT FOR ADDITIONAL REFILLS   ondansetron 4 MG tablet Commonly known as:  ZOFRAN Take 1 tablet (4 mg total) by mouth every 8 (eight) hours as needed for nausea or vomiting.   oxybutynin 5 MG tablet Commonly known as:  DITROPAN Take 1 tablet (5 mg total) by mouth every 8 (eight) hours as needed for bladder spasms.   oxyCODONE-acetaminophen 5-325 MG tablet Commonly known as:  ROXICET Take 1 tablet by mouth every 6 (six) hours as needed.   ranitidine 150 MG tablet Commonly known as:  ZANTAC Take 150 mg by mouth daily as needed for heartburn.   sulfamethoxazole-trimethoprim 800-160 MG tablet Commonly known as:  BACTRIM DS,SEPTRA DS Take 1 tablet by mouth every 12 (twelve) hours.       Allergies:  Allergies  Allergen Reactions  . Cinnamon     Dry throat     Family History: Family History  Problem Relation Age of Onset  . Breast cancer Mother   . Cancer Mother   . Heart disease Maternal Grandfather   . Colon polyps Maternal Aunt     Social History:  reports that she has never smoked. She has never used smokeless tobacco. She reports that she does not drink alcohol or use drugs.  Review of Systems  Gastrointestinal (upper)  : Negative for upper GI symptoms  Gastrointestinal (lower) : Negative for lower GI symptoms  Constitutional : Negative for symptoms  Skin: Negative for skin symptoms  Eyes: Negative for eye symptoms  Ear/Nose/Throat : Negative for Ear/Nose/Throat symptoms  Hematologic/Lymphatic: Negative for Hematologic/Lymphatic symptoms  Cardiovascular : Negative for cardiovascular symptoms  Respiratory : Negative for respiratory symptoms  Endocrine: Negative for endocrine symptoms  Musculoskeletal: Negative for musculoskeletal symptoms  Neurological: Negative for neurological symptoms  Psychologic: Negative for  psychiatric symptoms  Physical Exam: There were no vitals taken for this visit.  Constitutional:  Alert and oriented, No acute distress. HEENT: [place] AT, moist mucus membranes.  Trachea midline, no masses. Cardiovascular: No clubbing, cyanosis, or edema. Respiratory: Normal respiratory effort, no increased work of breathing. GI: Abdomen is soft, nontender, nondistended, no abdominal masses GU: No CVA tenderness.  Skin: No rashes, bruises or suspicious lesions. Lymph: No cervical or inguinal adenopathy. Neurologic: Grossly intact, no focal deficits, moving all 4 extremities. Psychiatric: Normal mood and affect.  Laboratory Data: Lab Results  Component Value Date   WBC 8.1 01/18/2017   HGB 13.2 01/18/2017   HCT 37.9 01/18/2017   MCV 86.5 01/18/2017   PLT 169 01/18/2017    Lab Results  Component Value Date   CREATININE 0.60 01/18/2017    No results found for: PSA  No results found for: TESTOSTERONE  Lab Results  Component Value Date   HGBA1C 6.3 05/12/2014    Urinalysis    Component Value Date/Time   COLORURINE YELLOW (A) 01/18/2017 1132   APPEARANCEUR Turbid (A) 01/28/2017 1537   LABSPEC 1.014 01/18/2017 1132   PHURINE 5.0 01/18/2017 1132   GLUCOSEU Trace (A) 01/28/2017 1537   HGBUR LARGE (A) 01/18/2017 1132   BILIRUBINUR Negative 01/28/2017 1537   KETONESUR NEGATIVE 01/18/2017 1132   PROTEINUR 3+ (A) 01/28/2017 1537   PROTEINUR NEGATIVE 01/18/2017 1132   NITRITE Positive (A) 01/28/2017 1537   NITRITE NEGATIVE 01/18/2017 1132   LEUKOCYTESUR 3+ (A) 01/28/2017 1537    Pertinent Imaging: CT and [place] 01/2017 independently reviewed.     02/03/17  CC:  Chief Complaint  Patient presents with  . Cysto Stent Removal    HPI:  Blood pressure 111/68, pulse 98, height _0  (1.575 m), weight 88.3 kg (194 lb 11.2 oz). NED. A&Ox3.   No respiratory distress   Abd soft, NT, ND Normal external genitalia with patent urethral meatus  Cystoscopy Procedure  Note  Patient identification was confirmed, informed consent was obtained, and patient was prepped using Betadine solution.  Lidocaine jelly was administered per urethral meatus.    Preoperative abx where received prior to procedure.    Procedure: - Flexible cystoscope introduced, without any difficulty.   - Thorough search of the bladder revealed:    normal urethral meatus    normal urothelium    no stones    no ulcers     no tumors    no urethral polyps    no trabeculation  - Ureteral orifices were normal in position and appearance. - Bilateral ureteral stents removed, inspected / intact and discarded. Abigail [name] as Coletta Memos.   Post-Procedure: - Patient tolerated the procedure well  Assessment/ Plan:    Alexis Frock, MD  Assessment & Plan:   1. Nephrolithiasis - now stone free after first episode of colic. Discussed general dietary recs. As one of her stones was quite impacted I do feel f/u renal US in 23mo warranted to verify no occult hydro. If not, then PRN.    MAlexis Frock [facility] [address] [place] [phone]

## 2017-02-06 ENCOUNTER — Emergency Department
Admission: EM | Admit: 2017-02-06 | Discharge: 2017-02-06 | Disposition: A | Discharge status: Home / Self Care | Payer: Medicare Other | Attending: Emergency Medicine | Admitting: Emergency Medicine

## 2017-02-06 ENCOUNTER — Encounter: Payer: Self-pay | Admitting: Emergency Medicine

## 2017-02-06 DIAGNOSIS — R110 Nausea: Secondary | ICD-10-CM | POA: Diagnosis not present

## 2017-02-06 DIAGNOSIS — R112 Nausea with vomiting, unspecified: Secondary | ICD-10-CM | POA: Diagnosis not present

## 2017-02-06 DIAGNOSIS — R300 Dysuria: Secondary | ICD-10-CM

## 2017-02-06 DIAGNOSIS — E119 Type 2 diabetes mellitus without complications: Secondary | ICD-10-CM | POA: Diagnosis not present

## 2017-02-06 DIAGNOSIS — N3289 Other specified disorders of bladder: Secondary | ICD-10-CM | POA: Diagnosis not present

## 2017-02-06 DIAGNOSIS — J449 Chronic obstructive pulmonary disease, unspecified: Secondary | ICD-10-CM | POA: Insufficient documentation

## 2017-02-06 DIAGNOSIS — Z79899 Other long term (current) drug therapy: Secondary | ICD-10-CM | POA: Insufficient documentation

## 2017-02-06 DIAGNOSIS — Z7984 Long term (current) use of oral hypoglycemic drugs: Secondary | ICD-10-CM | POA: Diagnosis not present

## 2017-02-06 DIAGNOSIS — R109 Unspecified abdominal pain: Secondary | ICD-10-CM

## 2017-02-06 DIAGNOSIS — I10 Essential (primary) hypertension: Secondary | ICD-10-CM | POA: Diagnosis not present

## 2017-02-06 LAB — URINALYSIS, COMPLETE (UACMP) WITH MICROSCOPIC
BACTERIA UA: NONE SEEN
BILIRUBIN URINE: NEGATIVE
Glucose, UA: NEGATIVE mg/dL
Ketones, ur: NEGATIVE mg/dL
Leukocytes, UA: NEGATIVE
Nitrite: NEGATIVE
PROTEIN: 30 mg/dL — AB
SPECIFIC GRAVITY, URINE: 1.015 (ref 1.005–1.030)
pH: 5 (ref 5.0–8.0)

## 2017-02-06 LAB — CBC
HCT: 36.1 % (ref 35.0–47.0)
Hemoglobin: 12.4 g/dL (ref 12.0–16.0)
MCH: 30.3 pg (ref 26.0–34.0)
MCHC: 34.3 g/dL (ref 32.0–36.0)
MCV: 88.2 fL (ref 80.0–100.0)
PLATELETS: 261 10*3/uL (ref 150–440)
RBC: 4.09 MIL/uL (ref 3.80–5.20)
RDW: 12.7 % (ref 11.5–14.5)
WBC: 7.1 10*3/uL (ref 3.6–11.0)

## 2017-02-06 LAB — BASIC METABOLIC PANEL
Anion gap: 9 (ref 5–15)
BUN: 12 mg/dL (ref 6–20)
CHLORIDE: 106 mmol/L (ref 101–111)
CO2: 23 mmol/L (ref 22–32)
CREATININE: 0.83 mg/dL (ref 0.44–1.00)
Calcium: 9.4 mg/dL (ref 8.9–10.3)
GFR calc non Af Amer: >60 mL/min (ref >60)
Glucose, Bld: 167 mg/dL — ABNORMAL HIGH (ref 65–99)
POTASSIUM: 4.3 mmol/L (ref 3.5–5.1)
SODIUM: 138 mmol/L (ref 135–145)

## 2017-02-06 MED ORDER — ONDANSETRON 4 MG PO TBDP
4.0000 mg | ORAL_TABLET | Freq: Once | ORAL | Status: AC
Start: 2017-02-06 — End: 2017-02-06
  Administered 2017-02-06: 4 mg via ORAL
  Filled 2017-02-06: qty 1

## 2017-02-06 MED ORDER — OXYCODONE-ACETAMINOPHEN 5-325 MG PO TABS: 1.0000 | ORAL_TABLET | Freq: Four times a day (QID) | ORAL | 0 refills | Status: DC | PRN

## 2017-02-06 MED ORDER — SODIUM CHLORIDE 0.9 % IV BOLUS (SEPSIS): 1000.0000 mL | Freq: Once | INTRAVENOUS | Status: DC

## 2017-02-06 MED ORDER — KETOROLAC TROMETHAMINE 10 MG PO TABS
10.0000 mg | ORAL_TABLET | Freq: Once | ORAL | Status: AC
  Administered 2017-02-06: 10 mg via ORAL
  Filled 2017-02-06: qty 1

## 2017-02-06 MED ORDER — PHENAZOPYRIDINE HCL 100 MG PO TABS: 100.0000 mg | ORAL_TABLET | Freq: Three times a day (TID) | ORAL | 0 refills | Status: DC | PRN

## 2017-02-06 MED ORDER — PHENAZOPYRIDINE HCL 100 MG PO TABS
95.0000 mg | ORAL_TABLET | Freq: Once | ORAL | Status: AC
  Administered 2017-02-06: 100 mg via ORAL
  Filled 2017-02-06: qty 1

## 2017-02-06 MED ORDER — KETOROLAC TROMETHAMINE 10 MG PO TABS: 10.0000 mg | ORAL_TABLET | Freq: Three times a day (TID) | ORAL | 0 refills | Status: DC | PRN

## 2017-02-06 NOTE — Discharge Instructions (Signed)
Please take Pyridium for bladder spasms. You may take Toradol, with food, for mild to moderate pain. Do not take other NSAID medications including Aleve, Motrin, ibuprofen, or Advil when you're taking Toradol. Please take Percocet for severe pain. Do not drive within 8 hours of taking Percocet.  Return to the emergency department if you develop severe pain, fever, inability to keep down fluids, or any other symptoms concerning to you.

## 2017-02-06 NOTE — ED Provider Notes (Signed)
[facility] Emergency Department Provider Note  ____________________________________________  Time seen: Approximately 3:24 PM  I have reviewed the triage vital signs and the nursing notes.   HISTORY  Chief Complaint Flank Pain    HPI Abigail [name] is a 66 y.o. female history of bilateral renal colic and stentremoval 3d ago w/ bladder spasms, R flank pain, and dysuria.  The pt was recently treated for large stone on L, and stone on R w/ bilateral stents, complicated by pyelonephritis.  Stents were removed on Monday and she was doing well until yesterday when she developed severe bladder spasms but had run out of her oxybutynin. Overnight, she developed some mild dysuria w/o hematuria and right flank pain. She has had nausea without vomiting. No fevers or chills. No abdominal pain, diarrhea or constipation.   Past Medical History:  Diagnosis Date  . Arthritis   . Cancer ([place])   . COPD (chronic obstructive pulmonary disease) ([place])   . Diabetes mellitus ([place])    a. A1c 6.6 in 05/2013 indicating new dx.  . Fatty liver   . Female stress incontinence   . GERD (gastroesophageal reflux disease)   . Heart murmur   . Hemorrhoids   . Hyperlipidemia   . Hyperplastic colon polyp 06/07/2008  . Hypertension   . Hypertriglyceridemia   . IBS (irritable bowel syndrome)   . Lung nodule    a. 63mm left lung nodule by CT 05/2013.  . Obesity   . Psoriasis   . Skin cancer     Patient Active Problem List   Diagnosis Date Noted  . Nephrolithiasis 02/03/2017  . 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 Pulmonary nodule 05/21/2013  . Chest pain 05/20/2013  . IRRITABLE BOWEL SYNDROME 05/18/2008  . HYPERLIPIDEMIA 05/17/2008  . HYPERTENSION 05/17/2008  . HEMORRHOIDS 05/17/2008  . GERD 05/17/2008  . FATTY LIVER DISEASE 05/17/2008  . PSORIASIS 05/17/2008  . ARTHRITIS 05/17/2008  . DIARRHEA 05/17/2008  . STRESS INCONTINENCE 05/17/2008    Past Surgical History:   Procedure Laterality Date  . CATARACT EXTRACTION W/PHACO Left 10/06/2014   Procedure: CATARACT EXTRACTION PHACO AND INTRAOCULAR LENS PLACEMENT LEFT EYE;  Surgeon: Tonny Branch, MD;  Location: [facility];  Service: Ophthalmology;  Laterality: Left;  CDE 7.35  . CATARACT EXTRACTION W/PHACO Right 11/14/2014   Procedure: CATARACT EXTRACTION PHACO AND INTRAOCULAR LENS PLACEMENT RIGHT EYE;  Surgeon: Tonny Branch, MD;  Location: [facility];  Service: Ophthalmology;  Laterality: Right;  CDE:6.39  . CYSTOSCOPY W/ RETROGRADES Right 01/21/2017   Procedure: CYSTOSCOPY WITH RETROGRADE PYELOGRAM;  Surgeon: Hollice Espy, MD;  Location: [facility];  Service: Urology;  Laterality: Right;  . CYSTOSCOPY/URETEROSCOPY/HOLMIUM LASER/STENT PLACEMENT Left 01/21/2017   Procedure: CYSTOSCOPY/URETEROSCOPY/HOLMIUM LASER/STENT PLACEMENT;  Surgeon: Hollice Espy, MD;  Location: [facility];  Service: Urology;  Laterality: Left;  . DORSAL COMPARTMENT RELEASE Left 06/15/2015   Procedure: LEFT WRIST DEQUERVAINS;  Surgeon: Ninetta Lights, MD;  Location: [facility];  Service: Orthopedics;  Laterality: Left;  . HERNIA REPAIR    . MOUTH SURGERY    . NASAL SINUS SURGERY    . SHOULDER SURGERY     right  . TONSILLECTOMY    . VENTRAL HERNIA REPAIR      Current Outpatient Rx  . Order #: [id number] Class: Historical Med  . Order #: [id number] Class: Historical Med  . Order #: [id number] Class: Historical Med  . Order #: [id number] Class: Normal  . Order #: [id number] Class: Print  . Order #: [id number] Class: Normal  . Order #:  [id number] Class: Historical Med  . Order #: [id number] Class: Print  . Order #: [id number] Class: Historical Med  . Order #: [id number] Class: Historical Med  . Order #: [id number] Class: Normal  . Order #: [id number] Class: Print  . Order #: [id number] Class: Print  . Order #: [id number] Class: Print  . Order #: [id number] Class: Print  . Order #: [id number] Class: Historical Med  . Order #: [id number] Class: Normal     Allergies Cinnamon  Family History  Problem Relation Age of Onset  . Breast cancer Mother   . Cancer Mother   . Heart disease Maternal Grandfather   . Colon polyps Maternal Aunt   . Kidney cancer Neg Hx   . Bladder Cancer Neg Hx     Social History Social History  Substance Use Topics  . Smoking status: Never Smoker  . Smokeless tobacco: Never Used  . Alcohol use No    Review of Systems Constitutional: No fever/chills.No lightheadedness or syncope. Eyes: No visual changes. ENT: No sore throat. No congestion or rhinorrhea. Cardiovascular: Denies chest pain. Denies palpitations. Respiratory: Denies shortness of breath.  No cough. Gastrointestinal: Positive right flank pain. No abdominal pain.  Positive nausea, no vomiting.  No diarrhea.  No constipation. Genitourinary: Positive for dysuria. Negative for hematuria. Musculoskeletal: Negative for back pain. Skin: Negative for rash. Neurological: Negative for headaches. No focal numbness, tingling or weakness.   10-point ROS otherwise negative.  ____________________________________________   PHYSICAL EXAM:  VITAL SIGNS: ED Triage Vitals  Enc Vitals Group     BP 02/06/17 1250 (!) 150/77     Pulse Rate 02/06/17 1250 75     Resp 02/06/17 1250 18     Temp 02/06/17 1250 98.1 F (36.7 C)     Temp Source 02/06/17 1250 Oral     SpO2 02/06/17 1250 98 %     Weight 02/06/17 1250 194 lb (88 kg)     Height 02/06/17 1250 5\' 2"  (1.575 m)     Head Circumference --      Peak Flow --      Pain Score 02/06/17 1249 8     Pain Loc --      Pain Edu? --      Excl. in [place]? --     Constitutional: Alert and oriented. Well appearing and in no acute distress. Answers questions appropriately. Eyes: Conjunctivae are normal.  EOMI. No scleral icterus. Head: Atraumatic. Nose: No congestion/rhinnorhea. Mouth/Throat: Mucous membranes are moist.  Neck: No stridor.  Supple.   Cardiovascular: Normal rate, regular rhythm. No murmurs, rubs  or gallops.  Respiratory: Normal respiratory effort.  No accessory muscle use or retractions. Lungs CTAB.  No wheezes, rales or ronchi. Gastrointestinal: Soft, nontender and nondistended.  No guarding or rebound.  No peritoneal signs. No CVA tenderness to palpation bilaterally. Musculoskeletal: No LE edema.  Neurologic:  A&Ox3.  Speech is clear.  Face and smile are symmetric.  EOMI.  Moves all extremities well. Skin:  Skin is warm, dry and intact. No rash noted. Psychiatric: Mood and affect are normal. Speech and behavior are normal.  Normal judgement.  ____________________________________________   LABS (all labs ordered are listed, but only abnormal results are displayed)  Labs Reviewed  URINALYSIS, COMPLETE (UACMP) WITH MICROSCOPIC - Abnormal; Notable for the following:       Result Value   Color, Urine YELLOW (*)    APPearance CLOUDY (*)    Hgb urine dipstick LARGE (*)    Protein, ur 30 (*)    Squamous Epithelial / LPF 0-5 (*)  All other components within normal limits  BASIC METABOLIC PANEL - Abnormal; Notable for the following:    Glucose, Bld 167 (*)    All other components within normal limits  CBC   ____________________________________________  EKG  Not indicated ____________________________________________  RADIOLOGY  No results found.  ____________________________________________   PROCEDURES  Procedure(s) performed: None  Procedures  Critical Care performed: No ____________________________________________   INITIAL IMPRESSION / ASSESSMENT AND PLAN / ED COURSE  Pertinent labs & imaging results that were available during my care of the patient were reviewed by me and considered in my medical decision making (see chart for details).  66 y.o. female with a recent history of bilateral ureteral stent removal presenting for bladder spasm, right flank pain, nausea without vomiting. Overall, the patient is well-appearing and has reassuring examination. She  has no flank or abdominal tenderness on my examination. She is afebrile here. She is tolerating liquid at home. Her laboratory studies are also reassuring, with some hematuria in her urine but no evidence of infection. Her creatinine is within normal limits. She has a normal white blood cell count. Overall, I think she may be symptomatic from her ureteral stent removal, but there is no indication that she has a new stone, and infection, or new abdominal process going on today.  I've spoken with the urologist on-call for the patient's urology group, who agrees. Based on the patient's chart, she is stone free according to 2 previous urologist to have seen her in the last several weeks. At this time, we will focus on symptomatic treatment at home, and have her follow-up in the office on Monday as needed.  The patient is stable for discharge. Return precautions as well as follow-up instructions were discussed. ____________________________________________  FINAL CLINICAL IMPRESSION(S) / ED DIAGNOSES  Final diagnoses:  Right flank pain  Bladder spasm  Dysuria  Nausea without vomiting         NEW MEDICATIONS STARTED DURING THIS VISIT:  New Prescriptions   KETOROLAC (TORADOL) 10 MG TABLET    Take 1 tablet (10 mg total) by mouth every 8 (eight) hours as needed for moderate pain (with food).   OXYCODONE-ACETAMINOPHEN (ROXICET) 5-325 MG TABLET    Take 1 tablet by mouth every 6 (six) hours as needed.   PHENAZOPYRIDINE (PYRIDIUM) 100 MG TABLET    Take 1 tablet (100 mg total) by mouth 3 (three) times daily as needed for pain.      Eula Listen, MD 02/06/17 1547

## 2017-02-06 NOTE — ED Triage Notes (Signed)
Pt comes into the ED via POV c/o bladder spasms and right flank pain.  Patient has ureteral stents removed yesterday and ever since then has been having bladder spasms.  Patient had kidney stones present on the right side as well that they weren't able to find when they did her surgery, patient states she is now having the right sided flank pain as well.  Patient in NAD at this time with even and unlabored respirations.

## 2017-02-06 NOTE — ED Notes (Signed)
Resumed care from [place] rn.  Pt does not want iv.  meds given.  Pt alert.

## 2017-02-10 ENCOUNTER — Telehealth: Payer: Self-pay | Admitting: Urology

## 2017-02-10 ENCOUNTER — Telehealth: Payer: Self-pay

## 2017-02-10 NOTE — Telephone Encounter (Signed)
Pt is in horrible pain.  She can't sleep, not sure if kidney stones or what. [name] did surgery 2 weeks ago and would like for someone to give her a call.  [phone]

## 2017-02-10 NOTE — Telephone Encounter (Signed)
Spoke with pt who stated that she was seen in the ER over the weekend for stent/kidney stone pain. Pt stated that after she passed "a stone" she felt much better. Pt requested to bring "stone" to office to be examined. Made pt aware can drop "stone" off by office at her convince. Pt voiced understanding.

## 2017-02-10 NOTE — Telephone Encounter (Signed)
See previous message

## 2017-02-10 NOTE — Telephone Encounter (Signed)
Pt called stating over the weekend she was seen in the ER for stent pain. Attempted to call pt back without success.

## 2017-02-26 DIAGNOSIS — E119 Type 2 diabetes mellitus without complications: Secondary | ICD-10-CM | POA: Diagnosis not present

## 2017-02-26 DIAGNOSIS — J019 Acute sinusitis, unspecified: Secondary | ICD-10-CM | POA: Diagnosis not present

## 2017-03-11 ENCOUNTER — Telehealth: Payer: Self-pay | Admitting: Podiatry

## 2017-03-11 NOTE — Telephone Encounter (Signed)
Patient called the office said she saw Dr. Milinda Pointer on 12/04/16 for neuropathy in her feet/ legs and he referred her to Dr. Mohammed Kindle in [place]. He told her that someone in [place] office would call her with the appointment date / time and no one ever called her. She said that her condition is worse. Would like a nurse to call her back.

## 2017-03-11 NOTE — Telephone Encounter (Signed)
Send to dr crisp in [place] for pain mgt.

## 2017-03-12 NOTE — Telephone Encounter (Signed)
Left message on pt's home phone with Dr. Mohammed Kindle [phone]. Unable to leave a message wireless customer is unavailable.

## 2017-03-21 ENCOUNTER — Other Ambulatory Visit: Payer: Self-pay | Admitting: Pain Medicine

## 2017-03-21 DIAGNOSIS — M25512 Pain in left shoulder: Secondary | ICD-10-CM | POA: Diagnosis not present

## 2017-03-21 DIAGNOSIS — M5137 Other intervertebral disc degeneration, lumbosacral region: Secondary | ICD-10-CM | POA: Diagnosis not present

## 2017-03-21 DIAGNOSIS — M5033 Other cervical disc degeneration, cervicothoracic region: Secondary | ICD-10-CM | POA: Diagnosis not present

## 2017-03-21 DIAGNOSIS — M5031 Other cervical disc degeneration,  high cervical region: Secondary | ICD-10-CM | POA: Diagnosis not present

## 2017-03-21 DIAGNOSIS — M47816 Spondylosis without myelopathy or radiculopathy, lumbar region: Secondary | ICD-10-CM | POA: Diagnosis not present

## 2017-03-21 DIAGNOSIS — M5136 Other intervertebral disc degeneration, lumbar region: Secondary | ICD-10-CM

## 2017-03-21 DIAGNOSIS — E0843 Diabetes mellitus due to underlying condition with diabetic autonomic (poly)neuropathy: Secondary | ICD-10-CM | POA: Diagnosis not present

## 2017-03-21 DIAGNOSIS — G894 Chronic pain syndrome: Secondary | ICD-10-CM | POA: Diagnosis not present

## 2017-03-28 ENCOUNTER — Ambulatory Visit (HOSPITAL_COMMUNITY)
Admission: RE | Admit: 2017-03-28 | Discharge: 2017-03-28 | Disposition: A | Discharge status: Home / Self Care | Payer: Medicare Other | Source: Ambulatory Visit | Attending: Pain Medicine | Admitting: Pain Medicine

## 2017-03-28 DIAGNOSIS — M545 Low back pain: Secondary | ICD-10-CM | POA: Insufficient documentation

## 2017-03-28 DIAGNOSIS — M48061 Spinal stenosis, lumbar region without neurogenic claudication: Secondary | ICD-10-CM | POA: Diagnosis not present

## 2017-03-28 DIAGNOSIS — M5136 Other intervertebral disc degeneration, lumbar region: Secondary | ICD-10-CM

## 2017-03-28 DIAGNOSIS — M419 Scoliosis, unspecified: Secondary | ICD-10-CM | POA: Insufficient documentation

## 2017-04-01 ENCOUNTER — Other Ambulatory Visit: Payer: Self-pay | Admitting: Podiatry

## 2017-04-02 ENCOUNTER — Encounter: Payer: Self-pay | Admitting: Family Medicine

## 2017-04-02 ENCOUNTER — Ambulatory Visit (INDEPENDENT_AMBULATORY_CARE_PROVIDER_SITE_OTHER): Payer: Medicare Other | Admitting: Family Medicine

## 2017-04-02 VITALS — BP 142/84 | HR 70 | Temp 98.5°F | Resp 14 | Ht 62.0 in | Wt 196.0 lb

## 2017-04-02 DIAGNOSIS — M8949 Other hypertrophic osteoarthropathy, multiple sites: Secondary | ICD-10-CM

## 2017-04-02 DIAGNOSIS — I10 Essential (primary) hypertension: Secondary | ICD-10-CM | POA: Diagnosis not present

## 2017-04-02 DIAGNOSIS — K219 Gastro-esophageal reflux disease without esophagitis: Secondary | ICD-10-CM | POA: Diagnosis not present

## 2017-04-02 DIAGNOSIS — J42 Unspecified chronic bronchitis: Secondary | ICD-10-CM

## 2017-04-02 DIAGNOSIS — Z23 Encounter for immunization: Secondary | ICD-10-CM

## 2017-04-02 DIAGNOSIS — E782 Mixed hyperlipidemia: Secondary | ICD-10-CM

## 2017-04-02 DIAGNOSIS — J449 Chronic obstructive pulmonary disease, unspecified: Secondary | ICD-10-CM | POA: Insufficient documentation

## 2017-04-02 DIAGNOSIS — L408 Other psoriasis: Secondary | ICD-10-CM

## 2017-04-02 DIAGNOSIS — E1142 Type 2 diabetes mellitus with diabetic polyneuropathy: Secondary | ICD-10-CM

## 2017-04-02 DIAGNOSIS — I358 Other nonrheumatic aortic valve disorders: Secondary | ICD-10-CM | POA: Diagnosis not present

## 2017-04-02 DIAGNOSIS — E1149 Type 2 diabetes mellitus with other diabetic neurological complication: Secondary | ICD-10-CM | POA: Insufficient documentation

## 2017-04-02 DIAGNOSIS — E1140 Type 2 diabetes mellitus with diabetic neuropathy, unspecified: Secondary | ICD-10-CM | POA: Insufficient documentation

## 2017-04-02 DIAGNOSIS — M150 Primary generalized (osteo)arthritis: Secondary | ICD-10-CM | POA: Diagnosis not present

## 2017-04-02 DIAGNOSIS — M159 Polyosteoarthritis, unspecified: Secondary | ICD-10-CM

## 2017-04-02 LAB — CBC WITH DIFFERENTIAL/PLATELET
BASOS ABS: 0 {cells}/uL (ref 0–200)
Basophils Relative: 0 %
EOS ABS: 296 {cells}/uL (ref 15–500)
EOS PCT: 4 %
HCT: 42.5 % (ref 35.0–45.0)
HEMOGLOBIN: 14.3 g/dL (ref 12.0–15.0)
LYMPHS ABS: 2664 {cells}/uL (ref 850–3900)
Lymphocytes Relative: 36 %
MCH: 29 pg (ref 27.0–33.0)
MCHC: 33.6 g/dL (ref 32.0–36.0)
MCV: 86.2 fL (ref 80.0–100.0)
MPV: 10.6 fL (ref 7.5–12.5)
Monocytes Absolute: 518 cells/uL (ref 200–950)
Monocytes Relative: 7 %
NEUTROS PCT: 53 %
Neutro Abs: 3922 cells/uL (ref 1500–7800)
Platelets: 190 10*3/uL (ref 140–400)
RBC: 4.93 MIL/uL (ref 3.80–5.10)
RDW: 13.4 % (ref 11.0–15.0)
WBC: 7.4 10*3/uL (ref 3.8–10.8)

## 2017-04-02 LAB — TSH: TSH: 0.91 mIU/L

## 2017-04-02 LAB — COMPREHENSIVE METABOLIC PANEL
ALBUMIN: 4.1 g/dL (ref 3.6–5.1)
ALT: 30 U/L — ABNORMAL HIGH (ref 6–29)
AST: 34 U/L (ref 10–35)
Alkaline Phosphatase: 68 U/L (ref 33–130)
BUN: 8 mg/dL (ref 7–25)
CALCIUM: 9.3 mg/dL (ref 8.6–10.4)
CHLORIDE: 106 mmol/L (ref 98–110)
CO2: 22 mmol/L (ref 20–31)
CREATININE: 0.68 mg/dL (ref 0.50–0.99)
Glucose, Bld: 103 mg/dL — ABNORMAL HIGH (ref 70–99)
POTASSIUM: 3.7 mmol/L (ref 3.5–5.3)
SODIUM: 143 mmol/L (ref 135–146)
TOTAL PROTEIN: 6.6 g/dL (ref 6.1–8.1)
Total Bilirubin: 0.7 mg/dL (ref 0.2–1.2)

## 2017-04-02 LAB — LIPID PANEL
CHOL/HDL RATIO: 4.2 ratio (ref <5.0)
CHOLESTEROL: 222 mg/dL — AB (ref <200)
HDL: 53 mg/dL (ref >50)
LDL Cholesterol: 123 mg/dL — ABNORMAL HIGH (ref <100)
TRIGLYCERIDES: 230 mg/dL — AB (ref <150)
VLDL: 46 mg/dL — ABNORMAL HIGH (ref <30)

## 2017-04-02 NOTE — Assessment & Plan Note (Signed)
Continue gabapentin.

## 2017-04-02 NOTE — Assessment & Plan Note (Signed)
Not an active problems per pt, no current inhalers Does get sinus drainage, advised netty pot, nasal saline rinse, use flonase prn

## 2017-04-02 NOTE — Assessment & Plan Note (Signed)
Very loud murmur radiates all over Echo from 2014, she does not appear to have any symptoms Will see if her records show any other workup findings, if not would repeat Echo for any SOB

## 2017-04-02 NOTE — Assessment & Plan Note (Signed)
Continue Humira, prescribed by Dermatology

## 2017-04-02 NOTE — Assessment & Plan Note (Signed)
Goal A1C < 7%, continue metformin Check urine micro Given prevnar 13 today  Continue with podiatry

## 2017-04-02 NOTE — Assessment & Plan Note (Signed)
Discussed use of tylenol, has orthopedics as needed

## 2017-04-02 NOTE — Progress Notes (Signed)
Subjective:    Patient ID: Abigail [name], female    DOB: Apr 18, 1951, 66 y.o.   MRN: [id number]  Patient presents for New Patient~ Establish Care (is fasting)   Pt here to establish care, previous PCP Dr. Carlota Raspberry - last seen 2015 and recently Delia Chimes -last seen in 2017   Podiatry- Dr. Milinda Pointer  Pain Management- Dr. Primus Bravo  Urology- Dr. [name]  PMH reviewed   History of kidney stones in March  - followed by urology , had 2 stents placed temporarily, she passed a stone and had infection and took antibiotics for this, has f/u in July n   Chronic back pain- MRI last week showed disc bvulge and mild scoliosis and neck pain, Dr. Primus Bravo is evaluating these problems.     Neuroma with chronic foot pain/diabetic polyneuropathy- followed by podiatry, taking gabapentin as prescribed, but also some symptoms in hands   Psorasis- on Humira for past 2 years , Dr. Onalee Hua office in [place] , has   Seasonal allergies- uses flonase as needed, always has sinus pain, has been treated at [facility], has had maxillary scan done about 4 years ago told it was normal, has been told she has fluid in the ear as well.    HTN- taking lisinopril HCTZ for many years, was on statin drugs, but had  Muscle aches, had high TG was on fenofibrate - has tried crestor/lipitor/ zocor    DM- Type 2 diabetes, diagnosed in 4-5 years ago, taking metformin, check CBG fasting 140's, weight down 13lbs since March during time with the stone     COPD- was on inhalers in the past, history of PNA in past , non smoker ,gets SOB from time to time    Due for pneumonia vaccines   IBS- Dr. Olevia Perches- colonoscopy , has Levsvin   Dr. Noemi Chapel- OA of knees, has had shots in knees in past, was on meloxicam   Seen by Cardiology in the past, had workup for chest pain, nothing found, has benign heart murmur , Echo in 2014 showed aortic sclerosis   Mammogram- Done in [place], [place] with [facility]  Immunizations- Due for pneumonia, shingles  vaccine        Review Of Systems:  GEN- denies fatigue, fever, weight loss,weakness, recent illness HEENT- denies eye drainage, change in vision, nasal discharge, CVS- denies chest pain, palpitations RESP- denies SOB, cough, wheeze ABD- denies N/V, change in stools, abd pain GU- denies dysuria, hematuria, dribbling, incontinence MSK- + joint pain, muscle aches, injury Neuro- denies headache, dizziness, syncope, seizure activity       Objective:    BP (!) 142/84 Comment: has not taken meds this AM  Pulse 70   Temp 98.5 F (36.9 C) (Oral)   Resp 14   Ht 5\' 2"  (1.575 m)   Wt 196 lb (88.9 kg)   SpO2 97%   BMI 35.85 kg/m  GEN- NAD, alert and oriented x3,obese  HEENT- PERRL, EOMI, non injected sclera, pink conjunctiva, MMM, oropharynx clear, TM clear bilat no effusion, nares clear Neck- Supple, no thyromegaly CVS- RRR,  Loud 4/6  systolic murmur  RESP-CTAB ABD-NABS,soft,NT,ND Psych- normal affect and mood  EXT- No edema Pulses- Radial, DP- 2+        Assessment & Plan:      Problem List Items Addressed This Visit    Type 2 diabetes mellitus with neurological complications (HCC)    Goal A1C < 7%, continue metformin Check urine micro Given prevnar 13 today  Continue with podiatry      Relevant Orders   Hemoglobin A1c   Microalbumin / creatinine urine ratio   PSORIASIS    Continue Humira, prescribed by Dermatology       Osteoarthritis    Discussed use of tylenol, has orthopedics as needed      Hyperlipidemia    Does not tolerate statin drugs, plan to restart fenofibrate based on levels      Relevant Orders   Lipid panel   GERD   Essential hypertension - Primary    No meds today, per pt controlled with ACEI/HCTZ for years  Check renal function lipids, TSH      Relevant Orders   CBC with Differential/Platelet   Comprehensive metabolic panel   TSH   Diabetic neuropathy (HCC)    Continue gabapentin      COPD (chronic obstructive pulmonary  disease) ([place])    Not an active problems per pt, no current inhalers Does get sinus drainage, advised netty pot, nasal saline rinse, use flonase prn       Aortic valve sclerosis    Very loud murmur radiates all over Echo from 2014, she does not appear to have any symptoms Will see if her records show any other workup findings, if not would repeat Echo for any SOB         Note: This dictation was prepared with Dragon dictation along with smaller phrase technology. Any transcriptional errors that result from this process are unintentional.

## 2017-04-02 NOTE — Assessment & Plan Note (Signed)
Does not tolerate statin drugs, plan to restart fenofibrate based on levels

## 2017-04-02 NOTE — Assessment & Plan Note (Signed)
No meds today, per pt controlled with ACEI/HCTZ for years  Check renal function lipids, TSH

## 2017-04-02 NOTE — Patient Instructions (Addendum)
Release of records- [facility] Release of records- Delia Chimes NP  Prevnar 13 F/U 3 months

## 2017-04-03 ENCOUNTER — Ambulatory Visit: Payer: Medicare Other

## 2017-04-03 LAB — MICROALBUMIN / CREATININE URINE RATIO
Creatinine, Urine: 139 mg/dL (ref 20–320)
MICROALB/CREAT RATIO: 12 ug/mg{creat} (ref <30)
Microalb, Ur: 1.6 mg/dL

## 2017-04-03 LAB — HEMOGLOBIN A1C
HEMOGLOBIN A1C: 6 % — AB (ref <5.7)
MEAN PLASMA GLUCOSE: 126 mg/dL

## 2017-04-04 ENCOUNTER — Other Ambulatory Visit: Payer: Self-pay | Admitting: *Deleted

## 2017-04-04 MED ORDER — FENOFIBRATE 145 MG PO TABS: 145.0000 mg | ORAL_TABLET | Freq: Every day | ORAL | 3 refills | Status: DC

## 2017-04-07 DIAGNOSIS — M545 Low back pain: Secondary | ICD-10-CM | POA: Diagnosis not present

## 2017-04-08 ENCOUNTER — Telehealth: Payer: Self-pay | Admitting: Family Medicine

## 2017-04-08 DIAGNOSIS — M5137 Other intervertebral disc degeneration, lumbosacral region: Secondary | ICD-10-CM | POA: Diagnosis not present

## 2017-04-08 DIAGNOSIS — M5033 Other cervical disc degeneration, cervicothoracic region: Secondary | ICD-10-CM | POA: Diagnosis not present

## 2017-04-08 DIAGNOSIS — M47816 Spondylosis without myelopathy or radiculopathy, lumbar region: Secondary | ICD-10-CM | POA: Diagnosis not present

## 2017-04-08 DIAGNOSIS — M25512 Pain in left shoulder: Secondary | ICD-10-CM | POA: Diagnosis not present

## 2017-04-08 DIAGNOSIS — M5136 Other intervertebral disc degeneration, lumbar region: Secondary | ICD-10-CM | POA: Diagnosis not present

## 2017-04-08 DIAGNOSIS — G894 Chronic pain syndrome: Secondary | ICD-10-CM | POA: Diagnosis not present

## 2017-04-08 DIAGNOSIS — M5031 Other cervical disc degeneration,  high cervical region: Secondary | ICD-10-CM | POA: Diagnosis not present

## 2017-04-08 DIAGNOSIS — E0843 Diabetes mellitus due to underlying condition with diabetic autonomic (poly)neuropathy: Secondary | ICD-10-CM | POA: Diagnosis not present

## 2017-04-08 NOTE — Telephone Encounter (Signed)
Pt was seen by doctor gregory crisp office and states he would like to put her on cymbalta and to it and the gabapentin together for her diabetic neuropathy. Was told to ask if this would be okay with [place]. Please advise.

## 2017-04-09 NOTE — Telephone Encounter (Signed)
Call placed to patient. LMTRC.  

## 2017-04-09 NOTE — Telephone Encounter (Signed)
MD please advise

## 2017-04-09 NOTE — Telephone Encounter (Signed)
She can try a low dose cymbalta, starting around 20 or 30mg  once a day

## 2017-04-10 NOTE — Telephone Encounter (Signed)
Patient returned call and made aware.

## 2017-04-16 ENCOUNTER — Ambulatory Visit: Payer: Medicare Other | Admitting: Podiatry

## 2017-04-25 ENCOUNTER — Telehealth: Payer: Self-pay | Admitting: Urology

## 2017-04-25 NOTE — Telephone Encounter (Signed)
Disregard the previous message patient cancelled her appointment, she said she was going out of town and said she just saw here PCP and she was doing fine.  Did not want to reschd right now.  Abigail [name]

## 2017-04-25 NOTE — Telephone Encounter (Signed)
Dr.Manny put an order in for a ultrasound but he put it in for a complete which is wrong. Can someone fix this so the patient can get this done prior to her app.  Thanks,  Sharyn Lull

## 2017-05-06 DIAGNOSIS — M25512 Pain in left shoulder: Secondary | ICD-10-CM | POA: Diagnosis not present

## 2017-05-06 DIAGNOSIS — M5031 Other cervical disc degeneration,  high cervical region: Secondary | ICD-10-CM | POA: Diagnosis not present

## 2017-05-06 DIAGNOSIS — E0843 Diabetes mellitus due to underlying condition with diabetic autonomic (poly)neuropathy: Secondary | ICD-10-CM | POA: Diagnosis not present

## 2017-05-06 DIAGNOSIS — M5137 Other intervertebral disc degeneration, lumbosacral region: Secondary | ICD-10-CM | POA: Diagnosis not present

## 2017-05-06 DIAGNOSIS — G894 Chronic pain syndrome: Secondary | ICD-10-CM | POA: Diagnosis not present

## 2017-05-06 DIAGNOSIS — M5136 Other intervertebral disc degeneration, lumbar region: Secondary | ICD-10-CM | POA: Diagnosis not present

## 2017-05-06 DIAGNOSIS — M5033 Other cervical disc degeneration, cervicothoracic region: Secondary | ICD-10-CM | POA: Diagnosis not present

## 2017-05-06 DIAGNOSIS — M47816 Spondylosis without myelopathy or radiculopathy, lumbar region: Secondary | ICD-10-CM | POA: Diagnosis not present

## 2017-05-13 ENCOUNTER — Ambulatory Visit: Payer: Medicare Other

## 2017-06-04 ENCOUNTER — Ambulatory Visit (INDEPENDENT_AMBULATORY_CARE_PROVIDER_SITE_OTHER): Payer: Medicare Other | Admitting: Podiatry

## 2017-06-04 DIAGNOSIS — D3610 Benign neoplasm of peripheral nerves and autonomic nervous system, unspecified: Secondary | ICD-10-CM

## 2017-06-04 NOTE — Progress Notes (Signed)
She presents today for follow-up of the neuromas I haven't seen her since January. She states they feel about same.  Objective: Vital signs are stable she's alert and 3 she is pain on palpation to the second and third interdigital spaces bilaterally. Palpable Mulder clicks are noted. Pulses remain palpable.  Assessment: Diabetic peripheral neuropathy but she also has neuromas second and third interdigital space bilaterally.  Plan: Reinitiated dehydrated alcohol injections today second and third interdigital spaces. Follow up with her in 3 weeks

## 2017-06-07 ENCOUNTER — Other Ambulatory Visit: Payer: Self-pay | Admitting: Family Medicine

## 2017-06-10 ENCOUNTER — Other Ambulatory Visit: Payer: Self-pay | Admitting: Physician Assistant

## 2017-06-10 DIAGNOSIS — L409 Psoriasis, unspecified: Secondary | ICD-10-CM | POA: Diagnosis not present

## 2017-06-10 DIAGNOSIS — D485 Neoplasm of uncertain behavior of skin: Secondary | ICD-10-CM | POA: Diagnosis not present

## 2017-06-10 DIAGNOSIS — L570 Actinic keratosis: Secondary | ICD-10-CM | POA: Diagnosis not present

## 2017-06-10 DIAGNOSIS — L659 Nonscarring hair loss, unspecified: Secondary | ICD-10-CM | POA: Diagnosis not present

## 2017-06-10 DIAGNOSIS — B079 Viral wart, unspecified: Secondary | ICD-10-CM | POA: Diagnosis not present

## 2017-06-13 ENCOUNTER — Ambulatory Visit (INDEPENDENT_AMBULATORY_CARE_PROVIDER_SITE_OTHER): Payer: Medicare Other | Admitting: Family Medicine

## 2017-06-13 ENCOUNTER — Encounter: Payer: Self-pay | Admitting: Family Medicine

## 2017-06-13 VITALS — BP 136/82 | HR 86 | Temp 98.3°F | Resp 14 | Ht 62.0 in | Wt 193.0 lb

## 2017-06-13 DIAGNOSIS — E559 Vitamin D deficiency, unspecified: Secondary | ICD-10-CM | POA: Diagnosis not present

## 2017-06-13 DIAGNOSIS — L650 Telogen effluvium: Secondary | ICD-10-CM

## 2017-06-13 DIAGNOSIS — I10 Essential (primary) hypertension: Secondary | ICD-10-CM

## 2017-06-13 DIAGNOSIS — E782 Mixed hyperlipidemia: Secondary | ICD-10-CM

## 2017-06-13 NOTE — Progress Notes (Signed)
   Subjective:    Patient ID: Abigail [name], female    DOB: Nov 20, 1950, 66 y.o.   MRN: [id number]  Patient presents for Hair Loss (x6 months- states that she is loosing quite a bit of hiar per day and has thinning near scalp- has seen dermatology and advised to have laabs at PCP)   Pt here with hair thinning,she has DM, Hyperlipidemia, HTN- taking meds as prescribed, at establishing visit Tricor was started due to TG 230, LDL 123 and she does not tolerate statins  Seen by dermatology [place], off Humira for psoriasis, noticed hair thinning mostly in front for past 6 months or so so advised to have labs drawn  Review Of Systems:  GEN- denies fatigue, fever, weight loss,weakness, recent illness HEENT- denies eye drainage, change in vision, nasal discharge, CVS- denies chest pain, palpitations RESP- denies SOB, cough, wheeze ABD- denies N/V, change in stools, abd pain GU- denies dysuria, hematuria, dribbling, incontinence MSK- denies joint pain, muscle aches, injury Neuro- denies headache, dizziness, syncope, seizure activity       Objective:    BP 136/82   Pulse 86   Temp 98.3 F (36.8 C) (Oral)   Resp 14   Ht 5\' 2"  (1.575 m)   Wt 193 lb (87.5 kg)   SpO2 96%   BMI 35.30 kg/m  GEN- NAD, alert and oriented x3,obese  HEENT- PERRL, EOMI, non injected sclera, pink conjunctiva, MMM, oropharynx clear, TM clear bilat no effusion, nares clear Neck- Supple, no thyromegaly CVS- RRR,  Loud 4/6  systolic murmur  RESP-CTAB ABD-NABS,soft,NT,ND Skin- thinning at front center hair line, no broken shafts seen EXT- No edema      Assessment & Plan:      Problem List Items Addressed This Visit    Hyperlipidemia - Primary    Plan to recheck at next visit      Essential hypertension    Controlled no changes       Relevant Orders   CBC with Differential/Platelet (Completed)   Basic metabolic panel (Completed)    Other Visit Diagnoses    Telogen effluvium        unknown cause, labs drawn, will send to derm, I dont see any particular medication she is on as cause   Relevant Orders   CBC with Differential/Platelet (Completed)   Iron, TIBC and Ferritin Panel (Completed)   Vitamin D deficiency       Relevant Orders   Vitamin D, 25-hydroxy (Completed)      Note: This dictation was prepared with Dragon dictation along with smaller phrase technology. Any transcriptional errors that result from this process are unintentional.

## 2017-06-13 NOTE — Patient Instructions (Signed)
Cancel appt for end of august Lab visit in 1 month for Cholesterol  F/U 3 months

## 2017-06-14 ENCOUNTER — Encounter: Payer: Self-pay | Admitting: Family Medicine

## 2017-06-14 LAB — CBC WITH DIFFERENTIAL/PLATELET
BASOS ABS: 0 {cells}/uL (ref 0–200)
Basophils Relative: 0 %
EOS ABS: 348 {cells}/uL (ref 15–500)
Eosinophils Relative: 4 %
HCT: 43.4 % (ref 35.0–45.0)
HEMOGLOBIN: 14.6 g/dL (ref 12.0–15.0)
LYMPHS ABS: 3219 {cells}/uL (ref 850–3900)
Lymphocytes Relative: 37 %
MCH: 28.8 pg (ref 27.0–33.0)
MCHC: 33.6 g/dL (ref 32.0–36.0)
MCV: 85.6 fL (ref 80.0–100.0)
MPV: 10.4 fL (ref 7.5–12.5)
Monocytes Absolute: 696 cells/uL (ref 200–950)
Monocytes Relative: 8 %
NEUTROS ABS: 4437 {cells}/uL (ref 1500–7800)
Neutrophils Relative %: 51 %
Platelets: 225 10*3/uL (ref 140–400)
RBC: 5.07 MIL/uL (ref 3.80–5.10)
RDW: 14.8 % (ref 11.0–15.0)
WBC: 8.7 10*3/uL (ref 3.8–10.8)

## 2017-06-14 LAB — IRON,TIBC AND FERRITIN PANEL
%SAT: 32 % (ref 11–50)
Ferritin: 71 ng/mL (ref 20–288)
Iron: 111 ug/dL (ref 45–160)
TIBC: 342 ug/dL (ref 250–450)

## 2017-06-14 LAB — BASIC METABOLIC PANEL
BUN: 8 mg/dL (ref 7–25)
CHLORIDE: 105 mmol/L (ref 98–110)
CO2: 21 mmol/L (ref 20–32)
Calcium: 9.3 mg/dL (ref 8.6–10.4)
Creat: 0.7 mg/dL (ref 0.50–0.99)
Glucose, Bld: 95 mg/dL (ref 70–99)
POTASSIUM: 3.6 mmol/L (ref 3.5–5.3)
SODIUM: 141 mmol/L (ref 135–146)

## 2017-06-14 LAB — VITAMIN D 25 HYDROXY (VIT D DEFICIENCY, FRACTURES): Vit D, 25-Hydroxy: 25 ng/mL — ABNORMAL LOW (ref 30–100)

## 2017-06-14 NOTE — Assessment & Plan Note (Signed)
Plan to recheck at next visit

## 2017-06-14 NOTE — Assessment & Plan Note (Signed)
Controlled no changes 

## 2017-06-25 ENCOUNTER — Ambulatory Visit (INDEPENDENT_AMBULATORY_CARE_PROVIDER_SITE_OTHER): Payer: Medicare Other | Admitting: Podiatry

## 2017-06-25 ENCOUNTER — Encounter: Payer: Self-pay | Admitting: Podiatry

## 2017-06-25 DIAGNOSIS — G5781 Other specified mononeuropathies of right lower limb: Secondary | ICD-10-CM

## 2017-06-25 DIAGNOSIS — G5761 Lesion of plantar nerve, right lower limb: Secondary | ICD-10-CM

## 2017-06-25 MED ORDER — GABAPENTIN 300 MG PO CAPS: ORAL_CAPSULE | ORAL | 6 refills | Status: DC

## 2017-06-25 NOTE — Progress Notes (Signed)
She presents today for follow-up of neuroma second and third interdigital spaces bilaterally. She states that it hasn't really helped yet.  Objective: Vital signs are stable alert and oriented 3. Pulses are palpable. Palpable neuroma third interdigital space bilateral.  Assessment: Pain and limp secondary to neuroma third interdigital space bilateral.  Plan: Injected dehydrated alcohol to the third interdigital space bilateral today follow up with her in 3 weeks

## 2017-07-04 ENCOUNTER — Ambulatory Visit: Payer: Medicare Other | Admitting: Family Medicine

## 2017-07-14 ENCOUNTER — Other Ambulatory Visit: Payer: Medicare Other

## 2017-07-14 DIAGNOSIS — E782 Mixed hyperlipidemia: Secondary | ICD-10-CM | POA: Diagnosis not present

## 2017-07-14 LAB — LIPID PANEL
CHOL/HDL RATIO: 5.2 (calc) — AB (ref <5.0)
Cholesterol: 220 mg/dL — ABNORMAL HIGH (ref <200)
HDL: 42 mg/dL — ABNORMAL LOW (ref >50)
LDL Cholesterol (Calc): 153 mg/dL (calc) — ABNORMAL HIGH
NON-HDL CHOLESTEROL (CALC): 178 mg/dL — AB (ref <130)
Triglycerides: 127 mg/dL (ref <150)

## 2017-07-16 ENCOUNTER — Ambulatory Visit (INDEPENDENT_AMBULATORY_CARE_PROVIDER_SITE_OTHER): Payer: Medicare Other | Admitting: Podiatry

## 2017-07-16 ENCOUNTER — Encounter: Payer: Self-pay | Admitting: *Deleted

## 2017-07-16 DIAGNOSIS — D3610 Benign neoplasm of peripheral nerves and autonomic nervous system, unspecified: Secondary | ICD-10-CM

## 2017-07-16 NOTE — Progress Notes (Signed)
She presents today for follow-up of neuromas to the third interdigital space bilaterally. She states that I think the follow starting to get better as she refers to the injections and the gabapentin.  Objective: Vital signs are stable she is alert and oriented 3 pulses are palpable. No calf pain. She does have pain on palpation to the third interdigital space bilateral foot with palpable Mulder's click to the third interdigital space of the right foot greater than that of the left.  Assessment: Resolving neuroma third interdigital space bilaterally.  Plan: Reinjected dehydrated alcohol today bilateral foot. Follow up with her in 3 weeks

## 2017-08-06 ENCOUNTER — Encounter: Payer: Self-pay | Admitting: Podiatry

## 2017-08-06 ENCOUNTER — Ambulatory Visit (INDEPENDENT_AMBULATORY_CARE_PROVIDER_SITE_OTHER): Payer: Medicare Other | Admitting: Podiatry

## 2017-08-06 DIAGNOSIS — G5781 Other specified mononeuropathies of right lower limb: Secondary | ICD-10-CM

## 2017-08-06 DIAGNOSIS — G5761 Lesion of plantar nerve, right lower limb: Secondary | ICD-10-CM

## 2017-08-06 NOTE — Progress Notes (Signed)
She presents today for follow-up of neuroma third interdigital space bilateral foot states this seems to be getting better she states she is about 50% improved at this point.  Objective: Vital signs are stable alert and oriented 3. Pulses are palpable. Neurologic sensorium is intact. Deep tendon reflexes are intact muscle strength is normal symmetrical bilateral. Orthopedic evaluation was resulted was discussed of ankle range of motion without crepitation. Palpable Mulder's click third interspace bilateral.  Assessment: Resolving neuroma with neuritis third interspace bilateral.  Plan: Injected third interdigital space bilaterally today with another dose of dehydrated alcohol.

## 2017-08-18 NOTE — Telephone Encounter (Signed)
Entered in Error

## 2017-08-26 DIAGNOSIS — G894 Chronic pain syndrome: Secondary | ICD-10-CM | POA: Diagnosis not present

## 2017-08-26 DIAGNOSIS — E11610 Type 2 diabetes mellitus with diabetic neuropathic arthropathy: Secondary | ICD-10-CM | POA: Diagnosis not present

## 2017-08-26 DIAGNOSIS — Z5181 Encounter for therapeutic drug level monitoring: Secondary | ICD-10-CM | POA: Diagnosis not present

## 2017-08-26 DIAGNOSIS — Z79891 Long term (current) use of opiate analgesic: Secondary | ICD-10-CM | POA: Diagnosis not present

## 2017-08-27 ENCOUNTER — Encounter: Payer: Self-pay | Admitting: Podiatry

## 2017-08-27 ENCOUNTER — Ambulatory Visit (INDEPENDENT_AMBULATORY_CARE_PROVIDER_SITE_OTHER): Payer: Medicare Other | Admitting: Podiatry

## 2017-08-27 DIAGNOSIS — G5781 Other specified mononeuropathies of right lower limb: Secondary | ICD-10-CM

## 2017-08-27 DIAGNOSIS — G5761 Lesion of plantar nerve, right lower limb: Secondary | ICD-10-CM | POA: Diagnosis not present

## 2017-08-27 NOTE — Progress Notes (Signed)
She presents today for follow-up of nerve neuroma third interdigital space bilateral. She states it is doing better I can really tell a difference.  Objective: Vital signs are stable alert and oriented 3. Pulses are palpable. She has pain on palpation with a palpable Mulder's click third interdigital space bilateral.  Assessment: Neuroma third interdigital space bilaterally slowly resolving.  Plan: Reinjected third interdigital space today dehydrated alcohol follow-up with me in 3 weeks.

## 2017-09-11 ENCOUNTER — Other Ambulatory Visit: Payer: Self-pay | Admitting: *Deleted

## 2017-09-11 MED ORDER — METFORMIN HCL 500 MG PO TABS: 500.0000 mg | ORAL_TABLET | Freq: Two times a day (BID) | ORAL | 0 refills | Status: DC

## 2017-09-15 ENCOUNTER — Other Ambulatory Visit: Payer: Self-pay

## 2017-09-15 ENCOUNTER — Ambulatory Visit (INDEPENDENT_AMBULATORY_CARE_PROVIDER_SITE_OTHER): Payer: Medicare Other | Admitting: Family Medicine

## 2017-09-15 ENCOUNTER — Encounter: Payer: Self-pay | Admitting: Family Medicine

## 2017-09-15 VITALS — BP 132/68 | HR 70 | Temp 98.6°F | Resp 16 | Ht 62.0 in | Wt 193.0 lb

## 2017-09-15 DIAGNOSIS — E1142 Type 2 diabetes mellitus with diabetic polyneuropathy: Secondary | ICD-10-CM | POA: Diagnosis not present

## 2017-09-15 DIAGNOSIS — E1149 Type 2 diabetes mellitus with other diabetic neurological complication: Secondary | ICD-10-CM | POA: Diagnosis not present

## 2017-09-15 DIAGNOSIS — I10 Essential (primary) hypertension: Secondary | ICD-10-CM

## 2017-09-15 DIAGNOSIS — J019 Acute sinusitis, unspecified: Secondary | ICD-10-CM

## 2017-09-15 DIAGNOSIS — L408 Other psoriasis: Secondary | ICD-10-CM

## 2017-09-15 DIAGNOSIS — B372 Candidiasis of skin and nail: Secondary | ICD-10-CM | POA: Diagnosis not present

## 2017-09-15 MED ORDER — NYSTATIN 100000 UNIT/GM EX POWD: Freq: Four times a day (QID) | CUTANEOUS | 1 refills | Status: DC

## 2017-09-15 MED ORDER — CLOTRIMAZOLE-BETAMETHASONE 1-0.05 % EX CREA: 1.0000 "application " | TOPICAL_CREAM | Freq: Two times a day (BID) | CUTANEOUS | 1 refills | Status: DC

## 2017-09-15 MED ORDER — TETANUS-DIPHTH-ACELL PERTUSSIS 5-2-15.5 LF-MCG/0.5 IM SUSP: 0.5000 mL | Freq: Once | INTRAMUSCULAR | 0 refills | Status: AC

## 2017-09-15 MED ORDER — CEFDINIR 300 MG PO CAPS: 300.0000 mg | ORAL_CAPSULE | Freq: Two times a day (BID) | ORAL | 0 refills | Status: DC

## 2017-09-15 MED ORDER — FLUCONAZOLE 150 MG PO TABS: ORAL_TABLET | ORAL | 1 refills | Status: DC

## 2017-09-15 NOTE — Progress Notes (Signed)
   Subjective:    Patient ID: Abigail [name], female    DOB: [birth year], 66 y.o.   MRN: [id number]  Patient presents for 3 month F/U (is fasting)   Pt here to f/u chronic medical problems. 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, cuts herslef on occasion   Sinus pressure and drainage for past 2 weeks, facial pain, post nasal drip,  Has some cough, using flonase , no fever   Review Of Systems:  GEN- denies fatigue, fever, weight loss,weakness, recent illness HEENT- denies eye drainage, change in vision, +nasal discharge, CVS- denies chest pain, palpitations RESP- denies SOB, +cough, wheeze ABD- denies N/V, change in stools, abd pain GU- denies dysuria, hematuria, dribbling, incontinence MSK- denies joint pain, muscle aches, injury Neuro- denies headache, dizziness, syncope, seizure activity       Objective:    BP 132/68   Pulse 70   Temp 98.6 F (37 C) (Oral)   Resp 16   Ht 5\' 2"  (1.575 m)   Wt 193 lb (87.5 kg)   SpO2 97%   BMI 35.30 kg/m  GEN- NAD, alert and oriented x3 HEENT- PERRL, EOMI, non injected sclera, pink conjunctiva, MMM, oropharynx mild injection, TM clear bilat no effusion,  + maxillary sinus tenderness, inflammed turbinates,  Nasal drainage  Neck- Supple, no LAD CVS- RRR, 3/6SEM  RESP-CTAB EXT- No edema Pulses- Radial 2+ Skin- psoriatric patches nape of neck, back, erythema behind ears, no scaley lesions, erythematous plaque like lesions beneath left breast          Assessment & Plan:      Problem List Items Addressed This Visit      Unprioritized   Diabetic neuropathy ([place])   Type 2 diabetes mellitus with neurological  complications (HCC)    Check A1C, continue metformin Well controlled Continue neuropathy medication per her pain physician      Relevant Orders   Hemoglobin A1c   HM DIABETES FOOT EXAM (Completed)   PSORIASIS    She will f/ u with dermatology   But for the intertigo given topical anti-fungal as well as nystatin powder and diflucan      Essential hypertension - Primary    Well controlled      Relevant Orders   CBC with Differential/Platelet   Comprehensive metabolic panel    Other Visit Diagnoses    Intertriginous candidiasis       Relevant Medications   nystatin (MYCOSTATIN/NYSTOP) powder   clotrimazole-betamethasone (LOTRISONE) cream   fluconazole (DIFLUCAN) 150 MG tablet   cefdinir (OMNICEF) 300 MG capsule   Acute rhinosinusitis       omnicef, nasal saline, anti-histamines   Relevant Medications   fluconazole (DIFLUCAN) 150 MG tablet   cefdinir (OMNICEF) 300 MG capsule      Note: This dictation was prepared with Dragon dictation along with smaller phrase technology. Any transcriptional errors that result from this process are unintentional.

## 2017-09-15 NOTE — Assessment & Plan Note (Signed)
Well controlled 

## 2017-09-15 NOTE — Assessment & Plan Note (Signed)
She will f/ u with dermatology   But for the intertigo given topical anti-fungal as well as nystatin powder and diflucan

## 2017-09-15 NOTE — Patient Instructions (Addendum)
Use cream and powder Take antibiotics for sinuses Take yeast pill We will call with lab results F/U 4 months for pHYSICAL

## 2017-09-15 NOTE — Assessment & Plan Note (Signed)
Check A1C, continue metformin Well controlled Continue neuropathy medication per her pain physician

## 2017-09-16 LAB — CBC WITH DIFFERENTIAL/PLATELET
BASOS PCT: 0.4 %
Basophils Absolute: 29 cells/uL (ref 0–200)
Eosinophils Absolute: 274 cells/uL (ref 15–500)
Eosinophils Relative: 3.8 %
HCT: 41.1 % (ref 35.0–45.0)
Hemoglobin: 14.2 g/dL (ref 11.7–15.5)
Lymphs Abs: 2297 cells/uL (ref 850–3900)
MCH: 29.2 pg (ref 27.0–33.0)
MCHC: 34.5 g/dL (ref 32.0–36.0)
MCV: 84.6 fL (ref 80.0–100.0)
MPV: 11.1 fL (ref 7.5–12.5)
Monocytes Relative: 9.1 %
Neutro Abs: 3946 cells/uL (ref 1500–7800)
Neutrophils Relative %: 54.8 %
PLATELETS: 209 10*3/uL (ref 140–400)
RBC: 4.86 10*6/uL (ref 3.80–5.10)
RDW: 12.4 % (ref 11.0–15.0)
TOTAL LYMPHOCYTE: 31.9 %
WBC: 7.2 10*3/uL (ref 3.8–10.8)
WBCMIX: 655 {cells}/uL (ref 200–950)

## 2017-09-16 LAB — COMPREHENSIVE METABOLIC PANEL
AG Ratio: 1.7 (calc) (ref 1.0–2.5)
ALBUMIN MSPROF: 4.2 g/dL (ref 3.6–5.1)
ALKALINE PHOSPHATASE (APISO): 72 U/L (ref 33–130)
ALT: 28 U/L (ref 6–29)
AST: 35 U/L (ref 10–35)
BUN: 9 mg/dL (ref 7–25)
CHLORIDE: 104 mmol/L (ref 98–110)
CO2: 28 mmol/L (ref 20–32)
CREATININE: 0.66 mg/dL (ref 0.50–0.99)
Calcium: 9.3 mg/dL (ref 8.6–10.4)
GLOBULIN: 2.5 g/dL (ref 1.9–3.7)
GLUCOSE: 104 mg/dL — AB (ref 65–99)
POTASSIUM: 4.3 mmol/L (ref 3.5–5.3)
SODIUM: 140 mmol/L (ref 135–146)
TOTAL PROTEIN: 6.7 g/dL (ref 6.1–8.1)
Total Bilirubin: 0.6 mg/dL (ref 0.2–1.2)

## 2017-09-16 LAB — HEMOGLOBIN A1C
EAG (MMOL/L): 7.9 (calc)
HEMOGLOBIN A1C: 6.6 %{Hb} — AB (ref <5.7)
MEAN PLASMA GLUCOSE: 143 (calc)

## 2017-09-18 ENCOUNTER — Encounter: Payer: Self-pay | Admitting: *Deleted

## 2017-09-23 DIAGNOSIS — E11610 Type 2 diabetes mellitus with diabetic neuropathic arthropathy: Secondary | ICD-10-CM | POA: Diagnosis not present

## 2017-09-23 DIAGNOSIS — Z79891 Long term (current) use of opiate analgesic: Secondary | ICD-10-CM | POA: Diagnosis not present

## 2017-09-23 DIAGNOSIS — G894 Chronic pain syndrome: Secondary | ICD-10-CM | POA: Diagnosis not present

## 2017-09-23 DIAGNOSIS — Z5181 Encounter for therapeutic drug level monitoring: Secondary | ICD-10-CM | POA: Diagnosis not present

## 2017-10-01 ENCOUNTER — Ambulatory Visit: Payer: Medicare Other | Admitting: Podiatry

## 2017-10-07 DIAGNOSIS — E11610 Type 2 diabetes mellitus with diabetic neuropathic arthropathy: Secondary | ICD-10-CM | POA: Diagnosis not present

## 2017-10-07 DIAGNOSIS — Z5181 Encounter for therapeutic drug level monitoring: Secondary | ICD-10-CM | POA: Diagnosis not present

## 2017-10-07 DIAGNOSIS — Z79891 Long term (current) use of opiate analgesic: Secondary | ICD-10-CM | POA: Diagnosis not present

## 2017-10-07 DIAGNOSIS — G894 Chronic pain syndrome: Secondary | ICD-10-CM | POA: Diagnosis not present

## 2017-10-10 DIAGNOSIS — L409 Psoriasis, unspecified: Secondary | ICD-10-CM | POA: Diagnosis not present

## 2017-10-10 DIAGNOSIS — L4050 Arthropathic psoriasis, unspecified: Secondary | ICD-10-CM | POA: Diagnosis not present

## 2017-10-15 ENCOUNTER — Ambulatory Visit (INDEPENDENT_AMBULATORY_CARE_PROVIDER_SITE_OTHER): Payer: Medicare Other | Admitting: Podiatry

## 2017-10-15 ENCOUNTER — Encounter: Payer: Self-pay | Admitting: Podiatry

## 2017-10-15 DIAGNOSIS — G5761 Lesion of plantar nerve, right lower limb: Secondary | ICD-10-CM

## 2017-10-15 DIAGNOSIS — G5781 Other specified mononeuropathies of right lower limb: Secondary | ICD-10-CM

## 2017-10-15 NOTE — Progress Notes (Signed)
She presents today with her husband with a chief complaint of painful neuromas bilaterally.  She has not been here since August 27, 2017.  She states they have been bothering her for the past several weeks and she would like to consider starting the alcohol injections again.  Objective: Vital signs are stable alert and oriented x3 pulses are palpable.  Palpable Mulder's click to the third interdigital space bilateral.  Assessment: Neuroma third interdigital space bilateral.  Plan: After verbal consent and sterile Betadine skin prep I injected the third interdigital space bilaterally with 4% dehydrated alcohol.  She tolerated procedure well with no complications.  I will follow-up with her in 3 weeks for another set of injections.

## 2017-10-31 ENCOUNTER — Other Ambulatory Visit: Payer: Self-pay | Admitting: *Deleted

## 2017-10-31 MED ORDER — FENOFIBRATE 145 MG PO TABS: 145.0000 mg | ORAL_TABLET | Freq: Every day | ORAL | 3 refills | Status: DC

## 2017-11-12 ENCOUNTER — Encounter: Payer: Self-pay | Admitting: Podiatry

## 2017-11-12 ENCOUNTER — Ambulatory Visit (INDEPENDENT_AMBULATORY_CARE_PROVIDER_SITE_OTHER): Payer: Medicare Other | Admitting: Podiatry

## 2017-11-12 DIAGNOSIS — G5761 Lesion of plantar nerve, right lower limb: Secondary | ICD-10-CM

## 2017-11-12 DIAGNOSIS — G5781 Other specified mononeuropathies of right lower limb: Secondary | ICD-10-CM

## 2017-11-12 NOTE — Progress Notes (Signed)
She presents today for follow-up of neuroma to the third interdigital space bilaterally she states that I really do not feel it is doing too much better but seems to be better than it was I supposed.  Objective: Palpable Mulder's click to the third interdigital space bilateral pulses remain palpable.  Right foot seems to be worse than the left.  Assessment: Pain in limb secondary to neuroma third interdigital space right greater than left.  Plan: Reinjected another dose of dehydrated alcohol 4% concentration to the third interdigital space bilateral after sterile Betadine skin prep.

## 2017-11-17 DIAGNOSIS — E11610 Type 2 diabetes mellitus with diabetic neuropathic arthropathy: Secondary | ICD-10-CM | POA: Diagnosis not present

## 2017-11-17 DIAGNOSIS — G894 Chronic pain syndrome: Secondary | ICD-10-CM | POA: Diagnosis not present

## 2017-11-17 DIAGNOSIS — Z5181 Encounter for therapeutic drug level monitoring: Secondary | ICD-10-CM | POA: Diagnosis not present

## 2017-11-17 DIAGNOSIS — Z79891 Long term (current) use of opiate analgesic: Secondary | ICD-10-CM | POA: Diagnosis not present

## 2017-12-04 ENCOUNTER — Other Ambulatory Visit: Payer: Self-pay | Admitting: *Deleted

## 2017-12-04 MED ORDER — METFORMIN HCL 500 MG PO TABS: 500.0000 mg | ORAL_TABLET | Freq: Two times a day (BID) | ORAL | 0 refills | Status: DC

## 2017-12-08 DIAGNOSIS — Z79891 Long term (current) use of opiate analgesic: Secondary | ICD-10-CM | POA: Diagnosis not present

## 2017-12-08 DIAGNOSIS — Z79899 Other long term (current) drug therapy: Secondary | ICD-10-CM | POA: Diagnosis not present

## 2017-12-08 DIAGNOSIS — Z5181 Encounter for therapeutic drug level monitoring: Secondary | ICD-10-CM | POA: Diagnosis not present

## 2017-12-08 DIAGNOSIS — Z029 Encounter for administrative examinations, unspecified: Secondary | ICD-10-CM | POA: Diagnosis not present

## 2017-12-08 DIAGNOSIS — G894 Chronic pain syndrome: Secondary | ICD-10-CM | POA: Diagnosis not present

## 2017-12-08 DIAGNOSIS — L400 Psoriasis vulgaris: Secondary | ICD-10-CM | POA: Diagnosis not present

## 2017-12-08 DIAGNOSIS — E11610 Type 2 diabetes mellitus with diabetic neuropathic arthropathy: Secondary | ICD-10-CM | POA: Diagnosis not present

## 2017-12-17 ENCOUNTER — Ambulatory Visit: Payer: Medicare Other | Admitting: Podiatry

## 2017-12-23 ENCOUNTER — Ambulatory Visit (INDEPENDENT_AMBULATORY_CARE_PROVIDER_SITE_OTHER): Payer: Medicare Other | Admitting: Podiatry

## 2017-12-23 ENCOUNTER — Encounter: Payer: Self-pay | Admitting: Podiatry

## 2017-12-23 DIAGNOSIS — G5761 Lesion of plantar nerve, right lower limb: Secondary | ICD-10-CM

## 2017-12-23 DIAGNOSIS — G5781 Other specified mononeuropathies of right lower limb: Secondary | ICD-10-CM

## 2017-12-24 ENCOUNTER — Ambulatory Visit (INDEPENDENT_AMBULATORY_CARE_PROVIDER_SITE_OTHER): Payer: Medicare Other | Admitting: Family Medicine

## 2017-12-24 ENCOUNTER — Encounter: Payer: Self-pay | Admitting: Family Medicine

## 2017-12-24 ENCOUNTER — Other Ambulatory Visit: Payer: Self-pay

## 2017-12-24 VITALS — BP 140/78 | HR 84 | Temp 98.9°F | Resp 16 | Ht 62.0 in | Wt 192.0 lb

## 2017-12-24 DIAGNOSIS — J441 Chronic obstructive pulmonary disease with (acute) exacerbation: Secondary | ICD-10-CM

## 2017-12-24 MED ORDER — FLUCONAZOLE 150 MG PO TABS: 150.0000 mg | ORAL_TABLET | Freq: Once | ORAL | 1 refills | Status: AC

## 2017-12-24 MED ORDER — GLUCOSE BLOOD VI STRP: ORAL_STRIP | 12 refills | Status: DC

## 2017-12-24 MED ORDER — DOXYCYCLINE HYCLATE 100 MG PO TABS: 100.0000 mg | ORAL_TABLET | Freq: Two times a day (BID) | ORAL | 0 refills | Status: DC

## 2017-12-24 MED ORDER — ALBUTEROL SULFATE HFA 108 (90 BASE) MCG/ACT IN AERS: 2.0000 | INHALATION_SPRAY | RESPIRATORY_TRACT | 1 refills | Status: DC | PRN

## 2017-12-24 MED ORDER — FLUTICASONE PROPIONATE 50 MCG/ACT NA SUSP: 2.0000 | Freq: Every day | NASAL | 6 refills | Status: DC | PRN

## 2017-12-24 MED ORDER — PREDNISONE 10 MG PO TABS: ORAL_TABLET | ORAL | 0 refills | Status: DC

## 2017-12-24 NOTE — Progress Notes (Signed)
    Subjective:    Patient ID: Abigail [name], female    DOB: 1951-01-05, 67 y.o.   MRN: [id number]  Patient presents for Cough (x5 weeks- productive cough with yellow to green colored mucus, fatigue, SOB on exertion )   Pt here with cough with production for past 5-6 weeks. Had fever initially that resolved. No body aches. Thick mucous coming up + wheezing. Too OTC cough medicine. No chest pain,   Using nasal spray a few weeks.   CBG have been up and down typical range 100-149, last A1C  6.6% 3 months ago, highest 190, taking metformin   Now on Cimzia for psoriasis -started Feb 4th , last shot on Feb 18th    Review Of Systems:  GEN- denies fatigue, fever, weight loss,weakness, recent illness HEENT- denies eye drainage, change in vision,+ nasal discharge, CVS- denies chest pain, palpitations RESP- denies SOB, +cough, wheeze ABD- denies N/V, change in stools, abd pain GU- denies dysuria, hematuria, dribbling, incontinence MSK- denies joint pain, muscle aches, injury Neuro- denies headache, dizziness, syncope, seizure activity       Objective:    BP 140/78   Pulse 84   Temp 98.9 F (37.2 C) (Oral)   Resp 16   Ht 5\' 2"  (1.575 m)   Wt 192 lb (87.1 kg)   SpO2 95%   BMI 35.12 kg/m  GEN- NAD, alert and oriented x3 HEENT- PERRL, EOMI, non injected sclera, pink conjunctiva, MMM, oropharynx clear Neck- Supple, no LAD  CVS- RRR, 3/6 SEM RESP- occasional wheeze with expiration, mild rhonchi at base no rales, normal WOB EXT- No edema Pulses- Radial 2+        Assessment & Plan:      Problem List Items Addressed This Visit    None    Visit Diagnoses    COPD exacerbation (HCC)    -  Primary   Doxycycline, prednisone taper, albuterol, mucinex. A1C to be done at next visit in few weeks. If she does improve after treatment needs CXR    Relevant Medications   fluticasone (FLONASE) 50 MCG/ACT nasal spray   predniSONE (DELTASONE) 10 MG tablet   albuterol (PROVENTIL  HFA;VENTOLIN HFA) 108 (90 Base) MCG/ACT inhaler      Note: This dictation was prepared with Dragon dictation along with smaller phrase technology. Any transcriptional errors that result from this process are unintentional.

## 2017-12-24 NOTE — Patient Instructions (Addendum)
Take antibiotics, steroids, mucinex, albuterol  F/U march as scheduled

## 2017-12-25 NOTE — Progress Notes (Signed)
   HPI: 67 year old female presents today for follow-up treatment and evaluation regarding of bilateral neuroma to the third interdigital space.  Patient has received a series of 6 dehydrated alcohol 4% injections into the areas.  She notices minimal improvement.  Patient states that the injections helped for a few days.  She presents today for further treatment and evaluation   Physical Exam: General: The patient is alert and oriented x3 in no acute distress.  Dermatology: Skin is warm, dry and supple bilateral lower extremities. Negative for open lesions or macerations.  Vascular: Palpable pedal pulses bilaterally. No edema or erythema noted. Capillary refill within normal limits.  Neurological: Epicritic and protective threshold grossly intact bilaterally.   Musculoskeletal Exam: Sharp pain with palpation of the third interspace and lateral compression of the metatarsal heads consistent with neuroma bilateral feet.  Positive [place] Canal sign with loadbearing of the forefoot.  Radiographic Exam:  Normal osseous mineralization. Joint spaces preserved. No fracture/dislocation/boney destruction.    Assessment: 1.  Morton's neuroma third interspace bilateral foot   Plan of Care:  1. Patient was evaluated. 2.  Today discussed different treatment modalities and different treatment options.  Today we are going to inject 0.5 cc Celestone Soluspan injected into the bilateral Morton's neuromas 3.  Continue gabapentin 100 mg TID 4.  Patient is currently being managed by a pain management physician.  Continue pain management 5.  Return to clinic in 3 weeks with Dr. Milinda Pointer   Edrick Kins, DPM [facility]  Dr. Edrick Kins, [facility] [facility]                                        [place], [place] [address]                Office [phone]  Fax [phone]

## 2018-01-05 ENCOUNTER — Other Ambulatory Visit: Payer: Self-pay

## 2018-01-05 MED ORDER — GABAPENTIN 300 MG PO CAPS: ORAL_CAPSULE | ORAL | 6 refills | Status: DC

## 2018-01-05 NOTE — Telephone Encounter (Signed)
Pharmacy request for refill of Gabapentin.  Per Dr. Amalia Hailey ok to refill.  Script has been sent to pharmacy

## 2018-01-07 DIAGNOSIS — R05 Cough: Secondary | ICD-10-CM | POA: Diagnosis not present

## 2018-01-07 DIAGNOSIS — J209 Acute bronchitis, unspecified: Secondary | ICD-10-CM | POA: Diagnosis not present

## 2018-01-12 DIAGNOSIS — Z5181 Encounter for therapeutic drug level monitoring: Secondary | ICD-10-CM | POA: Diagnosis not present

## 2018-01-12 DIAGNOSIS — E11610 Type 2 diabetes mellitus with diabetic neuropathic arthropathy: Secondary | ICD-10-CM | POA: Diagnosis not present

## 2018-01-12 DIAGNOSIS — G894 Chronic pain syndrome: Secondary | ICD-10-CM | POA: Diagnosis not present

## 2018-01-12 DIAGNOSIS — Z79891 Long term (current) use of opiate analgesic: Secondary | ICD-10-CM | POA: Diagnosis not present

## 2018-01-19 ENCOUNTER — Encounter: Payer: Self-pay | Admitting: Family Medicine

## 2018-01-19 ENCOUNTER — Ambulatory Visit (INDEPENDENT_AMBULATORY_CARE_PROVIDER_SITE_OTHER): Payer: Medicare Other | Admitting: Family Medicine

## 2018-01-19 ENCOUNTER — Other Ambulatory Visit: Payer: Self-pay | Admitting: *Deleted

## 2018-01-19 ENCOUNTER — Other Ambulatory Visit: Payer: Self-pay

## 2018-01-19 VITALS — BP 136/72 | HR 80 | Temp 98.1°F | Resp 16 | Ht 62.0 in | Wt 191.0 lb

## 2018-01-19 DIAGNOSIS — R911 Solitary pulmonary nodule: Secondary | ICD-10-CM

## 2018-01-19 DIAGNOSIS — E782 Mixed hyperlipidemia: Secondary | ICD-10-CM

## 2018-01-19 DIAGNOSIS — Z0000 Encounter for general adult medical examination without abnormal findings: Secondary | ICD-10-CM

## 2018-01-19 DIAGNOSIS — E1149 Type 2 diabetes mellitus with other diabetic neurological complication: Secondary | ICD-10-CM | POA: Diagnosis not present

## 2018-01-19 DIAGNOSIS — I358 Other nonrheumatic aortic valve disorders: Secondary | ICD-10-CM

## 2018-01-19 DIAGNOSIS — Z1211 Encounter for screening for malignant neoplasm of colon: Secondary | ICD-10-CM | POA: Diagnosis not present

## 2018-01-19 DIAGNOSIS — E1142 Type 2 diabetes mellitus with diabetic polyneuropathy: Secondary | ICD-10-CM | POA: Diagnosis not present

## 2018-01-19 DIAGNOSIS — Z1231 Encounter for screening mammogram for malignant neoplasm of breast: Secondary | ICD-10-CM

## 2018-01-19 DIAGNOSIS — Z23 Encounter for immunization: Secondary | ICD-10-CM

## 2018-01-19 DIAGNOSIS — J42 Unspecified chronic bronchitis: Secondary | ICD-10-CM | POA: Diagnosis not present

## 2018-01-19 DIAGNOSIS — N951 Menopausal and female climacteric states: Secondary | ICD-10-CM | POA: Diagnosis not present

## 2018-01-19 DIAGNOSIS — I10 Essential (primary) hypertension: Secondary | ICD-10-CM | POA: Diagnosis not present

## 2018-01-19 DIAGNOSIS — R0602 Shortness of breath: Secondary | ICD-10-CM | POA: Diagnosis not present

## 2018-01-19 DIAGNOSIS — Z1239 Encounter for other screening for malignant neoplasm of breast: Secondary | ICD-10-CM

## 2018-01-19 MED ORDER — TETANUS-DIPHTH-ACELL PERTUSSIS 5-2-15.5 LF-MCG/0.5 IM SUSP: 0.5000 mL | Freq: Once | INTRAMUSCULAR | 0 refills | Status: AC

## 2018-01-19 NOTE — Progress Notes (Signed)
Subjective:   Patient presents for Medicare Annual/Subsequent preventive examination.   Review Past Medical/Family/Social: per EMR   She was treated for COPD back in Feb 2/20 , advised to call back, if not improved and CXR would be done. She was instead seen at [facility] in [place] 3/6 , diagnosed with bronchitis, given tessalon, bactrim   has albuterol inhaler  Continues to have cough with production, no blood streaked , Getting SOB at times.     DM-  Last A1c 6.6% , taking metformin twice a day , highest 179 fasting, this AM 184, throughout the day it fluctuates   HTN- on lisinopril HCTZ    Hyperlipidemia - ldl 153 IN Sept, does not tolerate statins on Tric[place]     Eye doctor  Risk Factors  Current exercise habits:  Dietary issues discussed:   Cardiac risk factors: Obesity (BMI >= 30 kg/m2).   Depression Screen  (Note: if answer to either of the following is "Yes", a more complete depression screening is indicated)  Over the past two weeks, have you felt down, depressed or hopeless? No Over the past two weeks, have you felt little interest or pleasure in doing things? No Have you lost interest or pleasure in daily life? No Do you often feel hopeless? No Do you cry easily over simple problems? No   Activities of Daily Living  In your present state of health, do you have any difficulty performing the following activities?:  Driving? No  Managing money? No  Feeding yourself? No  Getting from bed to chair? No  Climbing a flight of stairs? No  Preparing food and eating?: No  Bathing or showering? No  Getting dressed: No  Getting to the toilet? No  Using the toilet:No  Moving around from place to place: No  In the past year have you fallen or had a near fall?:No  Are you sexually active? No  Do you have more than one partner? No   Hearing Difficulties: Sometimes   Do you often ask people to speak up or repeat themselves? Yes  Do you experience ringing or noises in  your ears? No Do you have difficulty understanding soft or whispered voices? Yes   Do you feel that you have a problem with memory? No Do you often misplace items? No  Do you feel safe at home? Yes  Cognitive Testing  Alert? Yes Normal Appearance?Yes  Oriented to person? Yes Place? Yes  Time? Yes  Recall of three objects? Yes  Can perform simple calculations? Yes  Displays appropriate judgment?Yes  Can read the correct time from a watch face?Yes   List the Names of Other Physician/Practitioners you currently use:   podiatry  Pain management [facility]- Dr. [name]  Dermatology- Psoriasis    Indicate any recent Medical Services you may have received from other than [facility] providers in the past year (date may be approximate).   Screening Tests / Date Colonoscopy  Due in August 2019                   Zostavax Declines  Mammogram  Due  Influenza Vaccine Declines  Pneumonia- due for Pneumovax 23 Tetanus/tdap - due   ROS: GEN- denies fatigue, fever, weight loss,weakness, recent illness HEENT- denies eye drainage, change in vision, nasal discharge, CVS- denies chest pain, palpitations RESP- denies SOB, +cough, wheeze ABD- denies N/V, change in stools, abd pain GU- denies dysuria, hematuria, dribbling, incontinence MSK- denies joint pain, muscle aches, injury  Neuro- denies headache, dizziness, syncope, seizure activity  PHYSICAL: GEN- NAD, alert and oriented x3,obese  HEENT- PERRL, EOMI, non injected sclera, pink conjunctiva, MMM, oropharynx clear, TM clear bilat no effusion, nares clear Neck- Supple, no thyromegaly, no bruit CVS- RRR,  Loud 3/6  systolic murmur  RESP-CTAB ABD-NABS,soft,NT,ND Psych- normal affect and mood  EXT- No edema Pulses- Radial, DP- 2+   Assessment:    Annual wellness medicare exam   Plan:    During the course of the visit the patient was educated and counseled about appropriate screening and preventive services including:   Pneumonia  vaccine 23 given   Due for Mammogram/Due Bone Density pt to schedule  Due for colonoscopy in August   Screen Neg for depression  DM- has been well controlled, check urine micro,obtain eye report, goal A1C < 7%   HTN- well controlled   COPD- ongoing cough x 2 months no improvement with meds, obtain PFT, CT of chest, history of lung nodule as well   Advanced directives given    Diet review for nutrition referral? Yes ____ Not Indicated __x__  Patient Instructions (the written plan) was given to the patient.  Medicare Attestation  I have personally reviewed:  The patient's medical and social history  Their use of alcohol, tobacco or illicit drugs  Their current medications and supplements  The patient's functional ability including ADLs,fall risks, home safety risks, cognitive, and hearing and visual impairment  Diet and physical activities  Evidence for depression or mood disorders  The patient's weight, height, BMI, and visual acuity have been recorded in the chart. I have made referrals, counseling, and provided education to the patient based on review of the above and I have provided the patient with a written personalized care plan for preventive services.

## 2018-01-19 NOTE — Patient Instructions (Addendum)
CT Scan to be done  PFT to be done  Schedule your Bone Density and Mammogram  Referral for colonoscopy tdap SENT to pharmacy  Release of records- [place]  F/U 4 months

## 2018-01-20 LAB — HEMOGLOBIN A1C
EAG (MMOL/L): 10.3 (calc)
HEMOGLOBIN A1C: 8.1 %{Hb} — AB (ref <5.7)
MEAN PLASMA GLUCOSE: 186 (calc)

## 2018-01-20 LAB — CBC WITH DIFFERENTIAL/PLATELET
Basophils Absolute: 30 cells/uL (ref 0–200)
Basophils Relative: 0.4 %
EOS PCT: 7 %
Eosinophils Absolute: 518 cells/uL — ABNORMAL HIGH (ref 15–500)
HCT: 38.9 % (ref 35.0–45.0)
Hemoglobin: 13.5 g/dL (ref 11.7–15.5)
Lymphs Abs: 2486 cells/uL (ref 850–3900)
MCH: 29.7 pg (ref 27.0–33.0)
MCHC: 34.7 g/dL (ref 32.0–36.0)
MCV: 85.5 fL (ref 80.0–100.0)
MPV: 11.2 fL (ref 7.5–12.5)
Monocytes Relative: 6.5 %
Neutro Abs: 3885 cells/uL (ref 1500–7800)
Neutrophils Relative %: 52.5 %
PLATELETS: 240 10*3/uL (ref 140–400)
RBC: 4.55 10*6/uL (ref 3.80–5.10)
RDW: 12.4 % (ref 11.0–15.0)
TOTAL LYMPHOCYTE: 33.6 %
WBC: 7.4 10*3/uL (ref 3.8–10.8)
WBCMIX: 481 {cells}/uL (ref 200–950)

## 2018-01-20 LAB — COMPREHENSIVE METABOLIC PANEL
AG Ratio: 1.7 (calc) (ref 1.0–2.5)
ALBUMIN MSPROF: 4 g/dL (ref 3.6–5.1)
ALKALINE PHOSPHATASE (APISO): 65 U/L (ref 33–130)
ALT: 22 U/L (ref 6–29)
AST: 26 U/L (ref 10–35)
BILIRUBIN TOTAL: 0.4 mg/dL (ref 0.2–1.2)
BUN: 11 mg/dL (ref 7–25)
CALCIUM: 8.9 mg/dL (ref 8.6–10.4)
CO2: 24 mmol/L (ref 20–32)
CREATININE: 0.73 mg/dL (ref 0.50–0.99)
Chloride: 106 mmol/L (ref 98–110)
Globulin: 2.4 g/dL (calc) (ref 1.9–3.7)
Glucose, Bld: 198 mg/dL — ABNORMAL HIGH (ref 65–99)
POTASSIUM: 4.3 mmol/L (ref 3.5–5.3)
Sodium: 140 mmol/L (ref 135–146)
Total Protein: 6.4 g/dL (ref 6.1–8.1)

## 2018-01-20 LAB — LIPID PANEL
CHOLESTEROL: 216 mg/dL — AB (ref <200)
HDL: 55 mg/dL (ref >50)
LDL Cholesterol (Calc): 133 mg/dL (calc) — ABNORMAL HIGH
Non-HDL Cholesterol (Calc): 161 mg/dL (calc) — ABNORMAL HIGH (ref <130)
Total CHOL/HDL Ratio: 3.9 (calc) (ref <5.0)
Triglycerides: 153 mg/dL — ABNORMAL HIGH (ref <150)

## 2018-01-20 LAB — SPECIMEN COMPROMISED

## 2018-01-20 LAB — MICROALBUMIN / CREATININE URINE RATIO
Creatinine, Urine: 120 mg/dL (ref 20–275)
MICROALB/CREAT RATIO: 3 ug/mg{creat} (ref <30)
Microalb, Ur: 0.4 mg/dL

## 2018-01-21 ENCOUNTER — Encounter: Payer: Self-pay | Admitting: Podiatry

## 2018-01-21 ENCOUNTER — Ambulatory Visit (INDEPENDENT_AMBULATORY_CARE_PROVIDER_SITE_OTHER): Payer: Medicare Other | Admitting: Podiatry

## 2018-01-21 DIAGNOSIS — G5781 Other specified mononeuropathies of right lower limb: Secondary | ICD-10-CM

## 2018-01-21 DIAGNOSIS — G5761 Lesion of plantar nerve, right lower limb: Secondary | ICD-10-CM | POA: Diagnosis not present

## 2018-01-21 NOTE — Progress Notes (Signed)
She presents today with continued pain to the third interdigital space bilaterally.  Objective: Vital signs are stable alert and oriented x3.  Pulses are palpable.  Palpable Mulder's click third interdigital space bilateral.  Not as tender as it has been previously.  Assessment: Pain in limb secondary to neuroma third interdigital space bilateral.  Plan: Injected dehydrated alcohol 4% after sterile Betadine skin prep third interdigital space bilateral 2 cc was injected.  Tolerated procedure well without complications.  Follow-up with her in 3 weeks.

## 2018-01-22 ENCOUNTER — Encounter (HOSPITAL_COMMUNITY): Payer: Self-pay

## 2018-01-22 ENCOUNTER — Ambulatory Visit (HOSPITAL_COMMUNITY)
Admission: RE | Admit: 2018-01-22 | Discharge: 2018-01-22 | Disposition: A | Discharge status: Home / Self Care | Payer: Medicare Other | Source: Ambulatory Visit | Attending: Family Medicine | Admitting: Family Medicine

## 2018-01-22 ENCOUNTER — Other Ambulatory Visit: Payer: Self-pay | Admitting: *Deleted

## 2018-01-22 DIAGNOSIS — R0602 Shortness of breath: Secondary | ICD-10-CM

## 2018-01-22 DIAGNOSIS — J42 Unspecified chronic bronchitis: Secondary | ICD-10-CM | POA: Insufficient documentation

## 2018-01-22 DIAGNOSIS — M8588 Other specified disorders of bone density and structure, other site: Secondary | ICD-10-CM | POA: Diagnosis not present

## 2018-01-22 DIAGNOSIS — Z1231 Encounter for screening mammogram for malignant neoplasm of breast: Secondary | ICD-10-CM | POA: Diagnosis not present

## 2018-01-22 DIAGNOSIS — N951 Menopausal and female climacteric states: Secondary | ICD-10-CM | POA: Diagnosis not present

## 2018-01-22 DIAGNOSIS — Z1239 Encounter for other screening for malignant neoplasm of breast: Secondary | ICD-10-CM

## 2018-01-22 DIAGNOSIS — Z780 Asymptomatic menopausal state: Secondary | ICD-10-CM | POA: Diagnosis not present

## 2018-01-22 LAB — PULMONARY FUNCTION TEST
DL/VA % pred: 107 %
DL/VA: 4.89 ml/min/mmHg/L
DLCO COR % PRED: 98 %
DLCO COR: 21.13 ml/min/mmHg
DLCO unc % pred: 98 %
DLCO unc: 21.2 ml/min/mmHg
FEF 25-75 POST: 1.87 L/s
FEF 25-75 Pre: 2 L/sec
FEF2575-%Change-Post: -6 %
FEF2575-%PRED-POST: 101 %
FEF2575-%PRED-PRE: 108 %
FEV1-%CHANGE-POST: -1 %
FEV1-%PRED-POST: 92 %
FEV1-%Pred-Pre: 94 %
FEV1-POST: 1.96 L
FEV1-Pre: 2 L
FEV1FVC-%CHANGE-POST: 0 %
FEV1FVC-%PRED-PRE: 106 %
FEV6-%Change-Post: 0 %
FEV6-%Pred-Post: 90 %
FEV6-%Pred-Pre: 91 %
FEV6-Post: 2.43 L
FEV6-Pre: 2.46 L
FEV6FVC-%Change-Post: 0 %
FEV6FVC-%PRED-POST: 104 %
FEV6FVC-%Pred-Pre: 104 %
FVC-%Change-Post: -1 %
FVC-%PRED-POST: 86 %
FVC-%PRED-PRE: 87 %
FVC-POST: 2.43 L
FVC-PRE: 2.46 L
PRE FEV1/FVC RATIO: 81 %
Post FEV1/FVC ratio: 81 %
Post FEV6/FVC ratio: 100 %
Pre FEV6/FVC Ratio: 100 %
RV % pred: 99 %
RV: 2.04 L
TLC % PRED: 101 %
TLC: 4.82 L

## 2018-01-22 MED ORDER — METFORMIN HCL 1000 MG PO TABS: 1000.0000 mg | ORAL_TABLET | Freq: Two times a day (BID) | ORAL | 3 refills | Status: DC

## 2018-01-22 MED ORDER — ALBUTEROL SULFATE (2.5 MG/3ML) 0.083% IN NEBU
2.5000 mg | INHALATION_SOLUTION | Freq: Once | RESPIRATORY_TRACT | Status: AC
  Administered 2018-01-22: 2.5 mg via RESPIRATORY_TRACT

## 2018-02-03 ENCOUNTER — Ambulatory Visit (INDEPENDENT_AMBULATORY_CARE_PROVIDER_SITE_OTHER): Payer: Medicare Other | Admitting: Family Medicine

## 2018-02-03 ENCOUNTER — Other Ambulatory Visit: Payer: Self-pay

## 2018-02-03 ENCOUNTER — Encounter: Payer: Self-pay | Admitting: Family Medicine

## 2018-02-03 VITALS — BP 128/68 | HR 100 | Temp 98.1°F | Resp 18 | Ht 62.0 in | Wt 191.0 lb

## 2018-02-03 DIAGNOSIS — R05 Cough: Secondary | ICD-10-CM

## 2018-02-03 DIAGNOSIS — R053 Chronic cough: Secondary | ICD-10-CM

## 2018-02-03 DIAGNOSIS — I10 Essential (primary) hypertension: Secondary | ICD-10-CM | POA: Diagnosis not present

## 2018-02-03 DIAGNOSIS — J42 Unspecified chronic bronchitis: Secondary | ICD-10-CM

## 2018-02-03 MED ORDER — BUDESONIDE-FORMOTEROL FUMARATE 80-4.5 MCG/ACT IN AERO: 2.0000 | INHALATION_SPRAY | Freq: Two times a day (BID) | RESPIRATORY_TRACT | 3 refills | Status: DC

## 2018-02-03 MED ORDER — HYDROCHLOROTHIAZIDE 25 MG PO TABS: 25.0000 mg | ORAL_TABLET | Freq: Every day | ORAL | 3 refills | Status: DC

## 2018-02-03 NOTE — Patient Instructions (Addendum)
Take zantac at bedtime Stop the lisinopril HCTZ New bp MEDICATION HCTZ Try the symbicort  I will call with CT results  Symbicort twice a day F/U as previous

## 2018-02-03 NOTE — Assessment & Plan Note (Signed)
Cough for the past 2 months.  She has history of COPD yet her PFTs look good.  She did have difficulty with a peak flow therefore registering some lower values.  She does feel some improved with albuterol and mildly with the prednisone but she is Artie been on 2 courses of this.  When I have her continue with her allergy medication.  Take her Zantac every evening in case there is some reflux contributing.  When to stop the lisinopril just in the setting of the chronic cough even though it is productive make sure that this is not contributing.  Instead we will give her hydrochlorothiazide 25 mg daily.  She has been given Symbicort inhaler to try sample given.  She has a CT of chest tomorrow.  Depending on the results of the CTA chest will get her to appropriate pulmonologist as the next step. On examination this does not appear to be cardiac in nature

## 2018-02-03 NOTE — Progress Notes (Signed)
Subjective:    Patient ID: Abigail [name], female    DOB: 08/25/1951, 67 y.o.   MRN: [id number]  Patient presents for Cough (has CT scheduled on 4/3- Peak Flow: 250, 300, 300) Here for continued cough.  She has past medical history of COPD based on her previous records she was treated for exacerbation back in February with steroids cough suppressant antibiotics, Adderall inhaler.  She did not improve so a few weeks later she went to her daughter's urgent care where they did chest x-ray concern for questionable pneumonia but no over rate was given.  They gave her Bactrim along with another course of prednisone and cough medicine.  I saw her on the 18th that time she was still coughing.  She been taking her allergy medication as well as ranitidine for acid reflux.  I set her up for pulmonary function test which came back good there was actually no evidence of COPD.  She is also scheduled for CT of the chest which she has tomorrow. No CHF history did have a 2D echo back in July 2014 which did not show any decrease in her systolic function she did have some sclerosis of the valve but no stenosis. She is also on Nucynta from her pain management physician Dr. Primus Bravo  She is on lisinopril HCTZ for BP   Review Of Systems:  GEN- denies fatigue, fever, weight loss,weakness, recent illness HEENT- denies eye drainage, change in vision, nasal discharge, CVS- denies chest pain, palpitations RESP- denies SOB, cough, wheeze ABD- denies N/V, change in stools, abd pain GU- denies dysuria, hematuria, dribbling, incontinence MSK- denies joint pain, muscle aches, injury Neuro- denies headache, dizziness, syncope, seizure activity       Objective:    BP 128/68   Pulse 100   Temp 98.1 F (36.7 C) (Oral)   Resp 18   Ht 5\' 2"  (1.575 m)   Wt 191 lb (86.6 kg)   SpO2 96%   BMI 34.93 kg/m  GEN- NAD, alert and oriented x3 HEENT- PERRL, EOMI, non injected sclera, pink conjunctiva, MMM, oropharynx clear Neck-  Supple, no LAD, no JVD  CVS- RRR HR 80's, 3/6 SEM RESP-CTAB, no wheeze, bronchitic cough  ABD-NABS,soft,NT,ND EXT- No edema Pulses- Radial  2+        Assessment & Plan:      Problem List Items Addressed This Visit      Unprioritized   Essential hypertension   Relevant Medications   hydrochlorothiazide (HYDRODIURIL) 25 MG tablet   COPD (chronic obstructive pulmonary disease) (HCC)    Cough for the past 2 months.  She has history of COPD yet her PFTs look good.  She did have difficulty with a peak flow therefore registering some lower values.  She does feel some improved with albuterol and mildly with the prednisone but she is Artie been on 2 courses of this.  When I have her continue with her allergy medication.  Take her Zantac every evening in case there is some reflux contributing.  When to stop the lisinopril just in the setting of the chronic cough even though it is productive make sure that this is not contributing.  Instead we will give her hydrochlorothiazide 25 mg daily.  She has been given Symbicort inhaler to try sample given.  She has a CT of chest tomorrow.  Depending on the results of the CTA chest will get her to appropriate pulmonologist as the next step. On examination this does not appear to be cardiac  in nature      Relevant Medications   budesonide-formoterol (SYMBICORT) 80-4.5 MCG/ACT inhaler    Other Visit Diagnoses    Chronic cough    -  Primary      Note: This dictation was prepared with Dragon dictation along with smaller phrase technology. Any transcriptional errors that result from this process are unintentional.

## 2018-02-04 ENCOUNTER — Ambulatory Visit (HOSPITAL_COMMUNITY)
Admission: RE | Admit: 2018-02-04 | Discharge: 2018-02-04 | Disposition: A | Discharge status: Home / Self Care | Payer: Medicare Other | Source: Ambulatory Visit | Attending: Family Medicine | Admitting: Family Medicine

## 2018-02-04 DIAGNOSIS — J42 Unspecified chronic bronchitis: Secondary | ICD-10-CM | POA: Insufficient documentation

## 2018-02-04 DIAGNOSIS — R911 Solitary pulmonary nodule: Secondary | ICD-10-CM | POA: Diagnosis not present

## 2018-02-04 DIAGNOSIS — R0602 Shortness of breath: Secondary | ICD-10-CM | POA: Insufficient documentation

## 2018-02-04 DIAGNOSIS — K760 Fatty (change of) liver, not elsewhere classified: Secondary | ICD-10-CM | POA: Diagnosis not present

## 2018-02-04 DIAGNOSIS — J479 Bronchiectasis, uncomplicated: Secondary | ICD-10-CM | POA: Diagnosis not present

## 2018-02-04 DIAGNOSIS — D3502 Benign neoplasm of left adrenal gland: Secondary | ICD-10-CM | POA: Diagnosis not present

## 2018-02-05 ENCOUNTER — Other Ambulatory Visit: Payer: Self-pay | Admitting: *Deleted

## 2018-02-05 DIAGNOSIS — J479 Bronchiectasis, uncomplicated: Secondary | ICD-10-CM

## 2018-02-05 DIAGNOSIS — J449 Chronic obstructive pulmonary disease, unspecified: Secondary | ICD-10-CM

## 2018-02-05 DIAGNOSIS — J4489 Other specified chronic obstructive pulmonary disease: Secondary | ICD-10-CM

## 2018-02-05 MED ORDER — LEVOFLOXACIN 500 MG PO TABS: 500.0000 mg | ORAL_TABLET | Freq: Every day | ORAL | 0 refills | Status: DC

## 2018-02-05 MED ORDER — ROSUVASTATIN CALCIUM 5 MG PO TABS: 5.0000 mg | ORAL_TABLET | ORAL | 0 refills | Status: DC

## 2018-02-09 DIAGNOSIS — G894 Chronic pain syndrome: Secondary | ICD-10-CM | POA: Diagnosis not present

## 2018-02-09 DIAGNOSIS — Z5181 Encounter for therapeutic drug level monitoring: Secondary | ICD-10-CM | POA: Diagnosis not present

## 2018-02-09 DIAGNOSIS — Z79891 Long term (current) use of opiate analgesic: Secondary | ICD-10-CM | POA: Diagnosis not present

## 2018-02-09 DIAGNOSIS — E11610 Type 2 diabetes mellitus with diabetic neuropathic arthropathy: Secondary | ICD-10-CM | POA: Diagnosis not present

## 2018-02-11 ENCOUNTER — Ambulatory Visit: Payer: Medicare Other | Admitting: Podiatry

## 2018-02-11 ENCOUNTER — Ambulatory Visit (INDEPENDENT_AMBULATORY_CARE_PROVIDER_SITE_OTHER): Payer: Medicare Other | Admitting: Internal Medicine

## 2018-02-11 ENCOUNTER — Encounter: Payer: Self-pay | Admitting: Internal Medicine

## 2018-02-11 VITALS — BP 130/72 | HR 110 | Ht 63.0 in | Wt 186.8 lb

## 2018-02-11 DIAGNOSIS — J479 Bronchiectasis, uncomplicated: Secondary | ICD-10-CM

## 2018-02-11 DIAGNOSIS — I10 Essential (primary) hypertension: Secondary | ICD-10-CM | POA: Diagnosis not present

## 2018-02-11 DIAGNOSIS — R05 Cough: Secondary | ICD-10-CM | POA: Diagnosis not present

## 2018-02-11 DIAGNOSIS — R058 Other specified cough: Secondary | ICD-10-CM

## 2018-02-11 MED ORDER — TELMISARTAN 80 MG PO TABS: 80.0000 mg | ORAL_TABLET | Freq: Every day | ORAL | 11 refills | Status: DC

## 2018-02-11 MED ORDER — AMOXICILLIN-POT CLAVULANATE 875-125 MG PO TABS: 1.0000 | ORAL_TABLET | Freq: Two times a day (BID) | ORAL | 0 refills | Status: AC

## 2018-02-11 MED ORDER — PANTOPRAZOLE SODIUM 40 MG PO TBEC: 40.0000 mg | DELAYED_RELEASE_TABLET | Freq: Every day | ORAL | 2 refills | Status: DC

## 2018-02-11 MED ORDER — PREDNISONE 10 MG PO TABS: ORAL_TABLET | ORAL | 0 refills | Status: DC

## 2018-02-11 NOTE — Patient Instructions (Addendum)
Prednisone 10 mg take  4 each am x 2 days,   2 each am x 2 days,  1 each am x 2 days and stop   Augmentin 875 mg take one pill twice daily  X 10 days - take at breakfast and supper with large glass of water.  It would help reduce the usual side effects (diarrhea and yeast infections) if you ate cultured yogurt at lunch.    Stop lisinopril and start micardis 80 mg one daily - if too strong can reduce by cutting it in half and take one half daily   Pantoprazole (protonix) 40 mg   Take  30-60 min before first meal of the day and Zantac 150 mg  one @  bedtime until return to office - this is the best way to tell whether stomach acid is contributing to your problem.     GERD (REFLUX)  is an extremely common cause of respiratory symptoms just like yours , many times with no obvious heartburn at all.    It can be treated with medication, but also with lifestyle changes including elevation of the head of your bed (ideally with 6 inch  bed blocks),  Smoking cessation, avoidance of late meals, excessive alcohol, and avoid fatty foods, chocolate, peppermint, colas, red wine, and acidic juices such as orange juice.  NO MINT OR MENTHOL PRODUCTS SO NO COUGH DROPS   USE SUGARLESS CANDY INSTEAD (Jolley ranchers or Stover's or Life Savers) or even ice chips will also do - the key is to swallow to prevent all throat clearing. NO OIL BASED VITAMINS - use powdered substitutes.    Please schedule a follow up office visit in 4 weeks, sooner if needed - late add: consider sinus ct on return if not better

## 2018-02-11 NOTE — Progress Notes (Signed)
Subjective:     Patient ID: Abigail [name], female   DOB: 07-06-1951,   MRN: [id number]  HPI   21 yowf never smoker grew up in  [place] around [place] "always had a little congestion= nasal and chest" and recurrent experience with "really bad cough with colds" never 100% better  but still able to play basketball ok in HS - pattern continued as adult / no problem with IUP- and in late 90's "got put on inhalers"? Name seemed to help but nothing ever eliminated the problem competely and then right after Nov 04 2017 abrupt onset  same pattern but this time the really bad cough that she gets with colds stuck with her despite abx/ prednisone / new inhaler = symbicort 80 2bid no better so referred to pulmonary clinic 02/11/2018 by Dr  Raliegh Ip [facility]   02/11/2018 1st [facility] office visit/ Abigail [name]   Chief Complaint  Patient presents with  . pulmonary consult    referred by Dr Buelah Manis for broncietasis,productive cough with green mucus   cough is sporadic and much worse when gets hot/ not much better with cough drop After nyquil at hs sleeps fine Last abx finished mid March 2018 > mucus mostly white now, occ green in am  Finished prednisone several weeks prior to OV   Unless coughing > Not limited by breathing from desired activities     No obvious other patterns in  day to day or daytime variability or assoc   mucus plugs or hemoptysis or cp or chest tightness, subjective wheeze or overt sinus or hb symptoms. No unusual exposure hx or h/o childhood pna/ asthma or knowledge of premature birth.  Sleeping  Ok p nyquil flat   without nocturnal  or early am exacerbation  of respiratory  c/o's or need for noct saba. Also denies any obvious fluctuation of symptoms with weather or environmental changes or other aggravating or alleviating factors except as outlined above   Current Allergies, Complete Past Medical History, Past Surgical History, Family History, and Social History were reviewed in [facility] record.  ROS  The following are not active complaints unless bolded Hoarseness, sore throat, dysphagia, dental problems, itching, sneezing,  nasal congestion or discharge of excess mucus or purulent secretions, ear ache,   fever, chills, sweats, unintended wt loss or wt gain, classically pleuritic or exertional cp,  orthopnea pnd or arm/hand swelling  or leg swelling, presyncope, palpitations, abdominal pain, anorexia, nausea, vomiting, diarrhea  or change in bowel habits or change in bladder habits, change in stools or change in urine, dysuria, hematuria,  rash, arthralgias, visual complaints, headache, numbness, weakness or ataxia or problems with walking or coordination,  change in mood or  memory.        Current Meds  Medication Sig  . albuterol (PROVENTIL HFA;VENTOLIN HFA) 108 (90 Base) MCG/ACT inhaler Inhale 2 puffs into the lungs every 4 (four) hours as needed for wheezing or shortness of breath.  . Certolizumab Pegol (CIMZIA [place]) Inject into the skin.  . DULoxetine (CYMBALTA) 30 MG capsule take 1 capsule by mouth once daily (IF TOLERATED)  . fenofibrate (TRICOR) 145 MG tablet Take 1 tablet (145 mg total) by mouth daily.  . fluticasone (FLONASE) 50 MCG/ACT nasal spray Place 2 sprays into both nostrils daily as needed for allergies.  Marland Kitchen gabapentin (NEURONTIN) 300 MG capsule Take three (3) capsules by mouth three (3) times a day.  Marland Kitchen glucose blood (FREESTYLE LITE) test strip Use as instructed  to monitor FSBS 1x daily. Dx: E11.9  . glucose monitoring kit (FREESTYLE) monitoring kit 1 each by Does not apply route as needed for other. Dispense one glucometer of choice, testing strips/lancets for once per day glucose checks.  QS for 1 month, 11 refills.  . hydrochlorothiazide (HYDRODIURIL) 25 MG tablet Take 1 tablet (25 mg total) by mouth daily.  . hyoscyamine (LEVSIN/SL) 0.125 MG SL tablet Place 1 tablet (0.125 mg total) under the tongue every 6 (six) hours as needed. (Patient  taking differently: Place 0.125 mg under the tongue every 6 (six) hours as needed for cramping. )  . metFORMIN (GLUCOPHAGE) 1000 MG tablet Take 1 tablet (1,000 mg total) by mouth 2 (two) times daily with a meal.  . nystatin (MYCOSTATIN/NYSTOP) powder Apply 4 (four) times daily topically.  . ranitidine (ZANTAC) 150 MG tablet Take 150 mg by mouth daily as needed for heartburn.  . rosuvastatin (CRESTOR) 5 MG tablet Take 1 tablet (5 mg total) by mouth once a week.  . tapentadol (NUCYNTA) 50 MG tablet Take 50 mg by mouth every 6 (six) hours as needed (2-3 times daily).  .     .     .     . [  lisinopril-hydrochlorothiazide (PRINZIDE,ZESTORETIC) 10-12.5 MG tablet Take 1 tablet by mouth at bedtime.          Review of Systems     Objective:   Physical Exam    amb very pleasant obese wf with harsh upper airway cough    Wt Readings from Last 3 Encounters:  02/11/18 186 lb 12.8 oz (84.7 kg)  02/03/18 191 lb (86.6 kg)  01/19/18 191 lb (86.6 kg)     Vital signs reviewed - Note on arrival 02 sats  95% on RA and BP 130/72       HEENT: nl dentition, turbinates bilaterally, and oropharynx. Nl external ear canals without cough reflex   NECK :  without JVD/Nodes/TM/ nl carotid upstrokes bilaterally   LUNGS: no acc muscle use,  Nl contour chest which is clear to A and P bilaterally without cough on insp or exp maneuvers   CV:  RRR  no s3 or murmur or increase in P2, and no edema   ABD:  soft and nontender with nl inspiratory excursion in the supine position. No bruits or organomegaly appreciated, bowel sounds nl  MS:  Nl gait/ ext warm without deformities, calf tenderness, cyanosis or clubbing No obvious joint restrictions   SKIN: warm and dry without lesions    NEURO:  alert, approp, nl sensorium with  no motor or cerebellar deficits apparent.         I personally reviewed images and agree with radiology impression as follows:   Chest CT w/o contrast 02/04/18 Circumferential  bronchial wall thickening with mild bronchiectasis and patchy/nodular airspace opacity posterior right lower lobe. Atypical infection could have this appearance and sequelae of chronic aspiration could present similarly.       Assessment:

## 2018-02-12 ENCOUNTER — Encounter: Payer: Self-pay | Admitting: Internal Medicine

## 2018-02-12 DIAGNOSIS — J479 Bronchiectasis, uncomplicated: Secondary | ICD-10-CM | POA: Insufficient documentation

## 2018-02-12 DIAGNOSIS — R05 Cough: Secondary | ICD-10-CM | POA: Insufficient documentation

## 2018-02-12 DIAGNOSIS — R058 Other specified cough: Secondary | ICD-10-CM | POA: Insufficient documentation

## 2018-02-12 NOTE — Assessment & Plan Note (Signed)
Reports"really bad chest colds" since childhood  - PFT's  01/22/2018  FEV1 1.96 (92 % ) ratio 81  p 1 % improvement from saba p no rx prior to study with DLCO  98 % corrects to 107 % for alv volume -    Chest CT w/o contrast 02/04/18 Circumferential bronchial wall thickening with mild bronchiectasis and patchy/nodular airspace opacity posterior right lower lobe. Atypical infection could have this appearance and sequelae of chronic aspiration could present similarly    This may explain the "really bad colds" since childhood but not clear it has anything to do with her present refractory cough > probably needs Ig profile/ sinus CT to complete the w/u if not better on f/u

## 2018-02-12 NOTE — Assessment & Plan Note (Signed)
Changed ACEi to arb 02/11/2018 due to longstanding /refractory "chest congestion"    In the best review of chronic cough to date ( NEJM 2016 375 0539-7673) ,  ACEi are now felt to cause cough in up to  20% of pts which is a 4 fold increase from previous reports and does not include the variety of non-specific complaints we see in pulmonary clinic in pts on ACEi but previously attributed to another dx like  Copd/asthma and  include PNDS, throat and chest congestion, "bronchitis", unexplained dyspnea and noct "strangling" sensations, and hoarseness, but also  atypical /refractory GERD symptoms like dysphagia and "bad heartburn"   The only way I know  to prove this is not an "ACEi Case" is a trial off ACEi x a minimum of 6 weeks then regroup.  Try micardis 80 mg daily and regroup in 4 weeks

## 2018-02-12 NOTE — Assessment & Plan Note (Addendum)
The most common causes of chronic cough in immunocompetent adults include the following: upper airway cough syndrome (UACS), previously referred to as postnasal drip syndrome (PNDS), which is caused by variety of rhinosinus conditions; (2) asthma; (3) GERD; (4) chronic bronchitis from cigarette smoking or other inhaled environmental irritants; (5) nonasthmatic eosinophilic bronchitis; and (6) bronchiectasis.   These conditions, singly or in combination, have accounted for up to 94% of the causes of chronic cough in prospective studies.   Other conditions have constituted no >6% of the causes in prospective studies These have included bronchogenic carcinoma, chronic interstitial pneumonia, sarcoidosis, left ventricular failure, ACEI-induced cough, and aspiration from a condition associated with pharyngeal dysfunction.    Chronic cough is often simultaneously caused by more than one condition. A single cause has been found from 38 to 82% of the time, multiple causes from 18 to 62%. Multiply caused cough has been the result of three diseases up to 42% of the time.    She has bronchiectasis but the pattern of cough she demonstrated today is more c/w Upper airway cough syndrome (previously labeled PNDS),  is so named because it's frequently impossible to sort out how much is  CR/sinusitis with freq throat clearing (which can be related to primary GERD)   vs  causing  secondary (" extra esophageal")  GERD from wide swings in gastric pressure that occur with throat clearing, often  promoting self use of mint and menthol lozenges that reduce the lower esophageal sphincter tone and exacerbate the problem further in a cyclical fashion.   These are the same pts (now being labeled as having "irritable larynx syndrome" by some cough centers) who not infrequently have a history of having failed to tolerate ace inhibitors,  dry powder inhalers or biphosphonates or report having atypical/extraesophageal reflux symptoms  that don't respond to standard doses of PPI  and are easily confused as having aecopd or asthma flares by even experienced allergists/ pulmonologists (myself included).   Will try off acei and rx for bronchiectasis/ gerd and ? AB (6 days only of pred, no inhalers for now )  then return to regroup in 4 weeks    Total time devoted to counseling  > 50 % of initial 60 min office visit:  review case with pt/ discussion of options/alternatives/ personally creating written customized instructions  in presence of pt  then going over those specific  Instructions directly with the pt including how to use all of the meds but in particular covering each new medication in detail and the difference between the maintenance= "automatic" meds and the prns using an action plan format for the latter (If this problem/symptom => do that organization reading Left to right).  Please see AVS from this visit for a full list of these instructions which I personally wrote for this pt and  are unique to this visit.

## 2018-02-18 ENCOUNTER — Ambulatory Visit (INDEPENDENT_AMBULATORY_CARE_PROVIDER_SITE_OTHER): Payer: Medicare Other | Admitting: Podiatry

## 2018-02-18 ENCOUNTER — Encounter: Payer: Self-pay | Admitting: Podiatry

## 2018-02-18 DIAGNOSIS — G5781 Other specified mononeuropathies of right lower limb: Secondary | ICD-10-CM

## 2018-02-18 DIAGNOSIS — G5761 Lesion of plantar nerve, right lower limb: Secondary | ICD-10-CM

## 2018-02-18 DIAGNOSIS — G5762 Lesion of plantar nerve, left lower limb: Secondary | ICD-10-CM | POA: Diagnosis not present

## 2018-02-18 NOTE — Progress Notes (Signed)
She presents today for follow-up of her neuroma third interdigital space bilaterally.  She said that is not better but is not worse when I asked her once again she states that it was a little better than it was previously.  Objective: Vital signs are stable she is alert and oriented x3.  Ulcers remain palpable and strong she does have pain on palpation with palpable Mulder's click to the third interdigital space bilateral.  Assessment: Neuroma third interdigital space bilateral.  Plan: Discussed etiology pathology conservative versus surgical therapies.  At this point I reinjected 2 cc of 4% dehydrated alcohol after sterile Betadine skin prep to the dorsal aspect of each foot overlying the third intermetatarsal space.  She tolerated procedure well without complications.  I will follow-up with her in 3 weeks.

## 2018-03-04 ENCOUNTER — Encounter: Payer: Medicare Other | Admitting: Podiatry

## 2018-03-05 ENCOUNTER — Other Ambulatory Visit: Payer: Self-pay | Admitting: Family Medicine

## 2018-03-05 MED ORDER — METFORMIN HCL 1000 MG PO TABS: 1000.0000 mg | ORAL_TABLET | Freq: Two times a day (BID) | ORAL | 3 refills | Status: DC

## 2018-03-09 ENCOUNTER — Ambulatory Visit: Payer: Medicare Other | Admitting: Podiatry

## 2018-03-09 DIAGNOSIS — Z79891 Long term (current) use of opiate analgesic: Secondary | ICD-10-CM | POA: Diagnosis not present

## 2018-03-09 DIAGNOSIS — E11610 Type 2 diabetes mellitus with diabetic neuropathic arthropathy: Secondary | ICD-10-CM | POA: Diagnosis not present

## 2018-03-09 DIAGNOSIS — G894 Chronic pain syndrome: Secondary | ICD-10-CM | POA: Diagnosis not present

## 2018-03-09 DIAGNOSIS — Z5181 Encounter for therapeutic drug level monitoring: Secondary | ICD-10-CM | POA: Diagnosis not present

## 2018-03-11 ENCOUNTER — Ambulatory Visit: Payer: Medicare Other | Admitting: Podiatry

## 2018-03-12 ENCOUNTER — Ambulatory Visit: Payer: Medicare Other | Admitting: Internal Medicine

## 2018-03-18 ENCOUNTER — Other Ambulatory Visit: Payer: Self-pay | Admitting: Physician Assistant

## 2018-03-18 DIAGNOSIS — L409 Psoriasis, unspecified: Secondary | ICD-10-CM | POA: Diagnosis not present

## 2018-03-18 DIAGNOSIS — L820 Inflamed seborrheic keratosis: Secondary | ICD-10-CM | POA: Diagnosis not present

## 2018-03-18 DIAGNOSIS — L570 Actinic keratosis: Secondary | ICD-10-CM | POA: Diagnosis not present

## 2018-03-18 DIAGNOSIS — D485 Neoplasm of uncertain behavior of skin: Secondary | ICD-10-CM | POA: Diagnosis not present

## 2018-03-18 NOTE — Progress Notes (Signed)
This encounter was created in error - please disregard.

## 2018-04-01 ENCOUNTER — Ambulatory Visit (INDEPENDENT_AMBULATORY_CARE_PROVIDER_SITE_OTHER): Payer: Medicare Other | Admitting: Podiatry

## 2018-04-01 ENCOUNTER — Encounter: Payer: Self-pay | Admitting: Podiatry

## 2018-04-01 ENCOUNTER — Ambulatory Visit (INDEPENDENT_AMBULATORY_CARE_PROVIDER_SITE_OTHER): Payer: Medicare Other

## 2018-04-01 DIAGNOSIS — S90121A Contusion of right lesser toe(s) without damage to nail, initial encounter: Secondary | ICD-10-CM

## 2018-04-01 DIAGNOSIS — G5762 Lesion of plantar nerve, left lower limb: Secondary | ICD-10-CM | POA: Diagnosis not present

## 2018-04-01 DIAGNOSIS — G5761 Lesion of plantar nerve, right lower limb: Secondary | ICD-10-CM | POA: Diagnosis not present

## 2018-04-01 DIAGNOSIS — G5782 Other specified mononeuropathies of left lower limb: Secondary | ICD-10-CM

## 2018-04-01 DIAGNOSIS — S90129A Contusion of unspecified lesser toe(s) without damage to nail, initial encounter: Secondary | ICD-10-CM

## 2018-04-01 DIAGNOSIS — G5781 Other specified mononeuropathies of right lower limb: Secondary | ICD-10-CM

## 2018-04-01 NOTE — Progress Notes (Signed)
She presents today for follow-up of her neuromas bilaterally.  She is concerned about a possible fracture to the third digit of the left foot.  She states that there are neuromas really are not that much better but I want to continue trying.  Objective: Vital signs are stable alert and oriented x3.  Pulses are palpable.  She has no erythema edema cellulitis drainage or odor to the third toe left foot.  She has tenderness on palpation to the third interdigital space bilateral consistent with neuroma.  Radiographs taken today demonstrate a healing fracture to the head of the proximal phalanx third digit with no displacement no comminution.  Assessment: Neuroma third interdigital space bilaterally.  Fracture third toe healing left foot.  Plan: After sterile Betadine skin prep I injected 2 cc of a 4% dehydrated alcohol solution and local anesthetic to the third interdigital space bilaterally.  Tolerated procedure well without complications and I will follow-up with her in the near future.

## 2018-04-15 ENCOUNTER — Encounter: Payer: Self-pay | Admitting: Gastroenterology

## 2018-04-22 ENCOUNTER — Ambulatory Visit: Payer: Medicare Other | Admitting: Podiatry

## 2018-05-12 ENCOUNTER — Other Ambulatory Visit: Payer: Self-pay | Admitting: Physician Assistant

## 2018-05-12 DIAGNOSIS — L4050 Arthropathic psoriasis, unspecified: Secondary | ICD-10-CM | POA: Diagnosis not present

## 2018-05-12 DIAGNOSIS — L723 Sebaceous cyst: Secondary | ICD-10-CM | POA: Diagnosis not present

## 2018-05-12 DIAGNOSIS — L720 Epidermal cyst: Secondary | ICD-10-CM | POA: Diagnosis not present

## 2018-05-12 DIAGNOSIS — L409 Psoriasis, unspecified: Secondary | ICD-10-CM | POA: Diagnosis not present

## 2018-05-12 DIAGNOSIS — L570 Actinic keratosis: Secondary | ICD-10-CM | POA: Diagnosis not present

## 2018-05-21 ENCOUNTER — Other Ambulatory Visit: Payer: Self-pay

## 2018-05-21 ENCOUNTER — Ambulatory Visit (AMBULATORY_SURGERY_CENTER): Payer: Self-pay | Admitting: *Deleted

## 2018-05-21 VITALS — Ht 62.0 in | Wt 190.2 lb

## 2018-05-21 DIAGNOSIS — Z86010 Personal history of colonic polyps: Secondary | ICD-10-CM

## 2018-05-21 MED ORDER — SUPREP BOWEL PREP KIT 17.5-3.13-1.6 GM/177ML PO SOLN
1.0000 | Freq: Once | ORAL | 0 refills | Status: AC
Start: 2018-05-21 — End: 2018-05-21

## 2018-05-21 NOTE — Progress Notes (Signed)
Patient denies any allergies to egg or soy products. Patient denies complications with anesthesia/sedation.  Patient denies oxygen use at home and denies diet medications. Patient denies information on colonoscopy. 

## 2018-05-22 ENCOUNTER — Other Ambulatory Visit: Payer: Self-pay

## 2018-05-22 ENCOUNTER — Ambulatory Visit (INDEPENDENT_AMBULATORY_CARE_PROVIDER_SITE_OTHER): Payer: Medicare Other | Admitting: Family Medicine

## 2018-05-22 ENCOUNTER — Encounter: Payer: Self-pay | Admitting: Family Medicine

## 2018-05-22 VITALS — BP 128/78 | HR 66 | Temp 98.2°F | Resp 16 | Ht 62.0 in | Wt 190.0 lb

## 2018-05-22 DIAGNOSIS — E1149 Type 2 diabetes mellitus with other diabetic neurological complication: Secondary | ICD-10-CM | POA: Diagnosis not present

## 2018-05-22 DIAGNOSIS — I10 Essential (primary) hypertension: Secondary | ICD-10-CM

## 2018-05-22 DIAGNOSIS — E782 Mixed hyperlipidemia: Secondary | ICD-10-CM | POA: Diagnosis not present

## 2018-05-22 DIAGNOSIS — E1142 Type 2 diabetes mellitus with diabetic polyneuropathy: Secondary | ICD-10-CM

## 2018-05-22 NOTE — Patient Instructions (Addendum)
Schedule an eye visit  F/U 4 months

## 2018-05-22 NOTE — Assessment & Plan Note (Signed)
She continues with treatment per the pain clinic she is on gabapentin and Cymbalta she rarely uses the pain medication

## 2018-05-22 NOTE — Progress Notes (Signed)
   Subjective:    Patient ID: Abigail [name], female    DOB: September 06, 1951, 67 y.o.   MRN: [id number]  Patient presents for Follow-up (is fasting)   Pt here to f/u chronic medical problems     History of chronic cough- was seen by pulmonary, cough has resolved      DM- , last A1C  8.1% in March decreased her MTF to 1000mg  once a day, due to diarrhea, she did not call to let us know, did not bring her meter, CBG per pt 90- 150 fasting . She has cut down on the SODA  - still getting injections in Neuroma of toe    Hyperlipidemia- not taking fenofibrate    HTN- taking lisinopril 20mg  now    Now follows with pain clinic every 3 months now- rarely takes nucynta, she is taking the cymbalta and gabapentin  RA- now on Cosentyx by rheumatology, seen last week- Abigail [name]    Due for eye exam     Review Of Systems:  GEN- denies fatigue, fever, weight loss,weakness, recent illness HEENT- denies eye drainage, change in vision, nasal discharge, CVS- denies chest pain, palpitations RESP- denies SOB, cough, wheeze ABD- denies N/V, change in stools, abd pain GU- denies dysuria, hematuria, dribbling, incontinence MSK- denies joint pain, muscle aches, injury Neuro- denies headache, dizziness, syncope, seizure activity       Objective:    BP 128/78   Pulse 66   Temp 98.2 F (36.8 C) (Oral)   Resp 16   Ht 5\' 2"  (1.575 m)   Wt 190 lb (86.2 kg)   SpO2 95%   BMI 34.75 kg/m  GEN- NAD, alert and oriented x3 HEENT- PERRL, EOMI, non injected sclera, pink conjunctiva, MMM, oropharynx clear Neck- Supple, no thyromegaly CVS- RRR, 3/6 SEM RESP-CTAB ABD-NABS,soft,NT,ND EXT- No edema Pulses- Radial, DP- 2+        Assessment & Plan:      Problem List Items Addressed This Visit      Unprioritized   Diabetic neuropathy ([name]) - Primary    She continues with treatment per the pain clinic she is on gabapentin and Cymbalta she rarely uses the pain medication      Essential  hypertension    Controlled no changes       Relevant Orders   CBC with Differential/Platelet   Comprehensive metabolic panel   Hyperlipidemia    Recheck lipids, restart tricor if needed       Relevant Orders   Lipid panel   Type 2 diabetes mellitus with neurological complications ([name])    Uncontrolled last visit, discussed dietary changes which she has made a few.  She has cut out soda.  Goal is A1c less than 7% she is also decreased to metformin so will likely need to add SGLT2 to her regimen. Does not tolerate statins. Discussed need for eye exam she is going to schedule      Relevant Orders   Hemoglobin A1c      Note: This dictation was prepared with Dragon dictation along with smaller phrase technology. Any transcriptional errors that result from this process are unintentional.

## 2018-05-22 NOTE — Assessment & Plan Note (Signed)
Recheck lipids, restart tricor if needed

## 2018-05-22 NOTE — Assessment & Plan Note (Addendum)
Uncontrolled last visit, discussed dietary changes which she has made a few.  She has cut out soda.  Goal is A1c less than 7% she is also decreased to metformin so will likely need to add SGLT2 to her regimen. Does not tolerate statins. Discussed need for eye exam she is going to schedule

## 2018-05-22 NOTE — Assessment & Plan Note (Signed)
Controlled no changes 

## 2018-05-23 LAB — CBC WITH DIFFERENTIAL/PLATELET
BASOS ABS: 27 {cells}/uL (ref 0–200)
Basophils Relative: 0.4 %
EOS ABS: 456 {cells}/uL (ref 15–500)
EOS PCT: 6.7 %
HCT: 41 % (ref 35.0–45.0)
Hemoglobin: 13.8 g/dL (ref 11.7–15.5)
Lymphs Abs: 2224 cells/uL (ref 850–3900)
MCH: 29.4 pg (ref 27.0–33.0)
MCHC: 33.7 g/dL (ref 32.0–36.0)
MCV: 87.2 fL (ref 80.0–100.0)
MONOS PCT: 7.8 %
MPV: 11.4 fL (ref 7.5–12.5)
NEUTROS PCT: 52.4 %
Neutro Abs: 3563 cells/uL (ref 1500–7800)
PLATELETS: 197 10*3/uL (ref 140–400)
RBC: 4.7 10*6/uL (ref 3.80–5.10)
RDW: 12.4 % (ref 11.0–15.0)
TOTAL LYMPHOCYTE: 32.7 %
WBC mixed population: 530 cells/uL (ref 200–950)
WBC: 6.8 10*3/uL (ref 3.8–10.8)

## 2018-05-23 LAB — COMPREHENSIVE METABOLIC PANEL
AG Ratio: 1.7 (calc) (ref 1.0–2.5)
ALKALINE PHOSPHATASE (APISO): 74 U/L (ref 33–130)
ALT: 20 U/L (ref 6–29)
AST: 30 U/L (ref 10–35)
Albumin: 4.2 g/dL (ref 3.6–5.1)
BILIRUBIN TOTAL: 0.7 mg/dL (ref 0.2–1.2)
BUN: 10 mg/dL (ref 7–25)
CALCIUM: 9.4 mg/dL (ref 8.6–10.4)
CHLORIDE: 105 mmol/L (ref 98–110)
CO2: 27 mmol/L (ref 20–32)
Creat: 0.7 mg/dL (ref 0.50–0.99)
GLUCOSE: 132 mg/dL — AB (ref 65–99)
Globulin: 2.5 g/dL (calc) (ref 1.9–3.7)
POTASSIUM: 4 mmol/L (ref 3.5–5.3)
SODIUM: 141 mmol/L (ref 135–146)
TOTAL PROTEIN: 6.7 g/dL (ref 6.1–8.1)

## 2018-05-23 LAB — LIPID PANEL
CHOL/HDL RATIO: 5 (calc) — AB (ref <5.0)
Cholesterol: 247 mg/dL — ABNORMAL HIGH (ref <200)
HDL: 49 mg/dL — ABNORMAL LOW (ref >50)
LDL Cholesterol (Calc): 152 mg/dL (calc) — ABNORMAL HIGH
Non-HDL Cholesterol (Calc): 198 mg/dL (calc) — ABNORMAL HIGH (ref <130)
Triglycerides: 280 mg/dL — ABNORMAL HIGH (ref <150)

## 2018-05-23 LAB — HEMOGLOBIN A1C
HEMOGLOBIN A1C: 6.5 %{Hb} — AB (ref <5.7)
Mean Plasma Glucose: 140 (calc)
eAG (mmol/L): 7.7 (calc)

## 2018-05-26 ENCOUNTER — Other Ambulatory Visit: Payer: Self-pay | Admitting: *Deleted

## 2018-05-26 MED ORDER — FENOFIBRATE 145 MG PO TABS: 145.0000 mg | ORAL_TABLET | Freq: Every day | ORAL | 3 refills | Status: DC

## 2018-05-27 ENCOUNTER — Ambulatory Visit: Payer: Medicare Other | Admitting: Podiatry

## 2018-06-01 ENCOUNTER — Encounter: Payer: Self-pay | Admitting: Podiatry

## 2018-06-01 ENCOUNTER — Ambulatory Visit (INDEPENDENT_AMBULATORY_CARE_PROVIDER_SITE_OTHER): Payer: Medicare Other | Admitting: Podiatry

## 2018-06-01 DIAGNOSIS — G5781 Other specified mononeuropathies of right lower limb: Secondary | ICD-10-CM

## 2018-06-01 DIAGNOSIS — G5762 Lesion of plantar nerve, left lower limb: Secondary | ICD-10-CM | POA: Diagnosis not present

## 2018-06-01 DIAGNOSIS — G5761 Lesion of plantar nerve, right lower limb: Secondary | ICD-10-CM

## 2018-06-01 DIAGNOSIS — G5782 Other specified mononeuropathies of left lower limb: Secondary | ICD-10-CM

## 2018-06-01 NOTE — Progress Notes (Signed)
She presents today for follow-up of her neuromas third interdigital space bilaterally.  States that is feeling some better than it was is not as bad as it was but I do not see large differences between each injection.  Objective: Vital signs are stable she is alert and oriented x3 palpable Mulder's click third interdigital space bilateral.  No open lesions or wounds.  Pulses are strongly palpable.  Assessment: Neuroma third interdigital space bilateral.  Plan: Discussed etiology pathology and surgical therapies at this point went ahead and performed an injection to the third interdigital space bilaterally consisting of 2 cc of 4% dehydrated alcohol.  She tolerated procedure well without complications.  We will follow-up with her in 3 to 4 weeks.

## 2018-06-05 ENCOUNTER — Encounter: Payer: Self-pay | Admitting: Gastroenterology

## 2018-06-05 ENCOUNTER — Ambulatory Visit (AMBULATORY_SURGERY_CENTER): Payer: Medicare Other | Admitting: Gastroenterology

## 2018-06-05 VITALS — BP 126/67 | HR 64 | Temp 98.7°F | Resp 12 | Ht 62.0 in | Wt 190.0 lb

## 2018-06-05 DIAGNOSIS — Z1211 Encounter for screening for malignant neoplasm of colon: Secondary | ICD-10-CM | POA: Diagnosis not present

## 2018-06-05 DIAGNOSIS — Z86010 Personal history of colonic polyps: Secondary | ICD-10-CM | POA: Diagnosis not present

## 2018-06-05 DIAGNOSIS — D123 Benign neoplasm of transverse colon: Secondary | ICD-10-CM

## 2018-06-05 MED ORDER — SODIUM CHLORIDE 0.9 % IV SOLN: 500.0000 mL | Freq: Once | INTRAVENOUS | Status: DC

## 2018-06-05 NOTE — Patient Instructions (Signed)
*  handout on polyps and diverticulosis given*  YOU HAD AN ENDOSCOPIC PROCEDURE TODAY AT THE [facility]:   Refer to the procedure report that was given to you for any specific questions about what was found during the examination.  If the procedure report does not answer your questions, please call your gastroenterologist to clarify.  If you requested that your care partner not be given the details of your procedure findings, then the procedure report has been included in a sealed envelope for you to review at your convenience later.  YOU SHOULD EXPECT: Some feelings of bloating in the abdomen. Passage of more gas than usual.  Walking can help get rid of the air that was put into your GI tract during the procedure and reduce the bloating. If you had a lower endoscopy (such as a colonoscopy or flexible sigmoidoscopy) you may notice spotting of blood in your stool or on the toilet paper. If you underwent a bowel prep for your procedure, you may not have a normal bowel movement for a few days.  Please Note:  You might notice some irritation and congestion in your nose or some drainage.  This is from the oxygen used during your procedure.  There is no need for concern and it should clear up in a day or so.  SYMPTOMS TO REPORT IMMEDIATELY:   Following lower endoscopy (colonoscopy or flexible sigmoidoscopy):  Excessive amounts of blood in the stool  Significant tenderness or worsening of abdominal pains  Swelling of the abdomen that is new, acute  Fever of 100F or higher   For urgent or emergent issues, a gastroenterologist can be reached at any hour by calling [phone].   DIET:  We do recommend a small meal at first, but then you may proceed to your regular diet.  Drink plenty of fluids but you should avoid alcoholic beverages for 24 hours.  ACTIVITY:  You should plan to take it easy for the rest of today and you should NOT DRIVE or use heavy machinery until tomorrow (because of  the sedation medicines used during the test).    FOLLOW UP: Our staff will call the number listed on your records the next business day following your procedure to check on you and address any questions or concerns that you may have regarding the information given to you following your procedure. If we do not reach you, we will leave a message.  However, if you are feeling well and you are not experiencing any problems, there is no need to return our call.  We will assume that you have returned to your regular daily activities without incident.  If any biopsies were taken you will be contacted by phone or by letter within the next 1-3 weeks.  Please call us at [phone] if you have not heard about the biopsies in 3 weeks.    SIGNATURES/CONFIDENTIALITY: You and/or your care partner have signed paperwork which will be entered into your electronic medical record.  These signatures attest to the fact that that the information above on your After Visit Summary has been reviewed and is understood.  Full responsibility of the confidentiality of this discharge information lies with you and/or your care-partner. 

## 2018-06-05 NOTE — Progress Notes (Signed)
Called to room to assist during endoscopic procedure.  Patient ID and intended procedure confirmed with present staff. Received instructions for my participation in the procedure from the performing physician.  

## 2018-06-05 NOTE — Progress Notes (Signed)
Pt. Reports no change in her medical or surgical history since her pre-visit 05/21/2018.

## 2018-06-05 NOTE — Op Note (Signed)
[facility] Patient Name: Abigail [name] Procedure Date: 06/05/2018 8:09 AM MRN: [id number] Endoscopist: Mauri Pole , MD Age: 67 Referring MD:  Date of Birth: 1951/08/07 Gender: Female Account #: [id number] Procedure:                Colonoscopy Indications:              Screening for colorectal malignant neoplasm Medicines:                Monitored Anesthesia Care Procedure:                Pre-Anesthesia Assessment:                           - Prior to the procedure, a History and Physical                            was performed, and patient medications and                            allergies were reviewed. The patient's tolerance of                            previous anesthesia was also reviewed. The risks                            and benefits of the procedure and the sedation                            options and risks were discussed with the patient.                            All questions were answered, and informed consent                            was obtained. Prior Anticoagulants: The patient has                            taken no previous anticoagulant or antiplatelet                            agents. ASA Grade Assessment: II - A patient with                            mild systemic disease. After reviewing the risks                            and benefits, the patient was deemed in                            satisfactory condition to undergo the procedure.                           After obtaining informed consent, the colonoscope  was passed under direct vision. Throughout the                            procedure, the patient's blood pressure, pulse, and                            oxygen saturations were monitored continuously. The                            Colonoscope was introduced through the anus and                            advanced to the the terminal ileum, with                            identification of the  appendiceal orifice and IC                            valve. The colonoscopy was performed without                            difficulty. The patient tolerated the procedure                            well. The quality of the bowel preparation was                            excellent. The ileocecal valve, appendiceal                            orifice, and rectum were photographed. Scope In: 8:11:38 AM Scope Out: 8:27:30 AM Scope Withdrawal Time: 0 hours 12 minutes 12 seconds  Total Procedure Duration: 0 hours 15 minutes 52 seconds  Findings:                 The perianal and digital rectal examinations were                            normal.                           A 7 mm polyp was found in the transverse colon. The                            polyp was sessile. The polyp was removed with a                            cold snare. Resection and retrieval were complete.                           A few small-mouthed diverticula were found in the                            sigmoid colon.  Non-bleeding internal hemorrhoids were found during                            retroflexion. The hemorrhoids were small. Complications:            No immediate complications. Estimated Blood Loss:     Estimated blood loss was minimal. Impression:               - One 7 mm polyp in the transverse colon, removed                            with a cold snare. Resected and retrieved.                           - Diverticulosis in the sigmoid colon.                           - Non-bleeding internal hemorrhoids. Recommendation:           - Patient has a contact number available for                            emergencies. The signs and symptoms of potential                            delayed complications were discussed with the                            patient. Return to normal activities tomorrow.                            Written discharge instructions were provided to the                             patient.                           - Resume previous diet.                           - Continue present medications.                           - Await pathology results.                           - Repeat colonoscopy in 5-10 years for surveillance                            based on pathology results. Mauri Pole, MD 06/05/2018 8:31:44 AM This report has been signed electronically.

## 2018-06-05 NOTE — Progress Notes (Signed)
A/ox3 pleased with MAC, report to RN 

## 2018-06-08 ENCOUNTER — Telehealth: Payer: Self-pay | Admitting: *Deleted

## 2018-06-08 NOTE — Telephone Encounter (Signed)
  Follow up Call-  Call back number 06/05/2018  Post procedure Call Back phone  # [phone]  Permission to leave phone message Yes  Some recent data might be hidden     Patient questions:  Do you have a fever, pain , or abdominal swelling? No. Pain Score  0 *  Have you tolerated food without any problems? Yes.    Have you been able to return to your normal activities? Yes.    Do you have any questions about your discharge instructions: Diet   No. Medications  No. Follow up visit  No.  Do you have questions or concerns about your Care? No.  Actions: * If pain score is 4 or above: No action needed, pain <4.

## 2018-06-09 DIAGNOSIS — E11610 Type 2 diabetes mellitus with diabetic neuropathic arthropathy: Secondary | ICD-10-CM | POA: Diagnosis not present

## 2018-06-09 DIAGNOSIS — Z79891 Long term (current) use of opiate analgesic: Secondary | ICD-10-CM | POA: Diagnosis not present

## 2018-06-09 DIAGNOSIS — G894 Chronic pain syndrome: Secondary | ICD-10-CM | POA: Diagnosis not present

## 2018-06-09 DIAGNOSIS — Z5181 Encounter for therapeutic drug level monitoring: Secondary | ICD-10-CM | POA: Diagnosis not present

## 2018-06-18 ENCOUNTER — Encounter: Payer: Self-pay | Admitting: Gastroenterology

## 2018-06-29 NOTE — Telephone Encounter (Signed)
This phone message was opened in error.

## 2018-07-01 ENCOUNTER — Encounter: Payer: Self-pay | Admitting: Podiatry

## 2018-07-01 ENCOUNTER — Ambulatory Visit (INDEPENDENT_AMBULATORY_CARE_PROVIDER_SITE_OTHER): Payer: Medicare Other | Admitting: Podiatry

## 2018-07-01 DIAGNOSIS — G5782 Other specified mononeuropathies of left lower limb: Secondary | ICD-10-CM

## 2018-07-01 DIAGNOSIS — G5761 Lesion of plantar nerve, right lower limb: Secondary | ICD-10-CM

## 2018-07-01 DIAGNOSIS — G5762 Lesion of plantar nerve, left lower limb: Secondary | ICD-10-CM | POA: Diagnosis not present

## 2018-07-01 DIAGNOSIS — G5781 Other specified mononeuropathies of right lower limb: Secondary | ICD-10-CM

## 2018-07-01 NOTE — Progress Notes (Signed)
She presents today for follow-up of her neuromas third interdigital space bilaterally she states there is no better no worse the last injection did not seem to do a whole lot.  Objective: Vital signs are stable alert and oriented x3.  Pulses are palpable.  Neurologic sensorium is intact.  Degenerative flexors are intact palpable Mulder's click third interdigital space of the right foot is worse than the left.  Assessment: Neuroma third interspace bilateral.  Plan: Injected 2 cc of 4% dehydrated alcohol to the third interspace bilateral after sterile Betadine skin prep.  Tolerated procedure well without complication.

## 2018-07-21 ENCOUNTER — Ambulatory Visit (INDEPENDENT_AMBULATORY_CARE_PROVIDER_SITE_OTHER): Payer: Medicare Other

## 2018-07-21 ENCOUNTER — Ambulatory Visit (INDEPENDENT_AMBULATORY_CARE_PROVIDER_SITE_OTHER): Payer: Medicare Other | Admitting: Orthopaedic Surgery

## 2018-07-21 DIAGNOSIS — M25562 Pain in left knee: Secondary | ICD-10-CM

## 2018-07-21 DIAGNOSIS — G8929 Other chronic pain: Secondary | ICD-10-CM | POA: Diagnosis not present

## 2018-07-21 MED ORDER — BUPIVACAINE HCL 0.5 % IJ SOLN
2.0000 mL | INTRAMUSCULAR | Status: AC | PRN
  Administered 2018-07-21: 2 mL via INTRA_ARTICULAR

## 2018-07-21 MED ORDER — LIDOCAINE HCL 1 % IJ SOLN
2.0000 mL | INTRAMUSCULAR | Status: AC | PRN
  Administered 2018-07-21: 2 mL

## 2018-07-21 MED ORDER — METHYLPREDNISOLONE ACETATE 40 MG/ML IJ SUSP
40.0000 mg | INTRAMUSCULAR | Status: AC | PRN
  Administered 2018-07-21: 40 mg via INTRA_ARTICULAR

## 2018-07-21 NOTE — Progress Notes (Signed)
Office Visit Note   Patient: Abigail [name]           Date of Birth: 05-16-1951           MRN: [id number] Visit Date: 07/21/2018              Requested by: Abigail Rossetti, MD [address] [place], [place] [address] PCP: Abigail Rossetti, MD   Assessment & Plan: Visit Diagnoses:  1. 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  Cortisone injection performed today which patient tolerated well.  Patient instructed to notify us if she does not see any improvement in 2 weeks so that we would obtain an MRI.  Otherwise she can follow-up as needed.  Follow-Up Instructions: Return if symptoms worsen or fail to improve.   Orders:  Orders Placed This Encounter  Procedures  . XR KNEE 3 VIEW LEFT   No orders of the defined types were placed in this encounter.     Procedures: Large Joint Inj: L knee on 07/21/2018 2:49 PM Details: 22 G needle Medications: 2 mL bupivacaine 0.5 %; 2 mL lidocaine 1 %; 40 mg methylPREDNISolone acetate 40 MG/ML Outcome: tolerated well, no immediate complications Patient was prepped and draped in the usual sterile fashion.       Clinical Data: No additional findings.   Subjective: Chief Complaint  Patient presents with  . Left Knee - Pain    Abigail [name] is a very pleasant 67 year old female comes in with left knee pain for at least several months.  She remains very active [place] her [place].  She denies any swelling.  Denies any injuries.  She injections in the past.  Her pain is mainly in the medial side of her knee.  Denies any mechanical symptoms.   Review of Systems  Constitutional: Negative.   HENT: Negative.   Eyes: Negative.   Respiratory: Negative.   Cardiovascular: Negative.   Endocrine: Negative.   Musculoskeletal: Negative.   Neurological: Negative.   Hematological: Negative.   Psychiatric/Behavioral: Negative.   All other systems reviewed and are  negative.    Objective: Vital Signs: There were no vitals taken for this visit.  Physical Exam  Constitutional: She is oriented to person, place, and time. She appears well-developed and well-nourished.  HENT:  Head: Normocephalic and atraumatic.  Eyes: EOM are normal.  Neck: Neck supple.  Pulmonary/Chest: Effort normal.  Abdominal: Soft.  Neurological: She is alert and oriented to person, place, and time.  Skin: Skin is warm. Capillary refill takes less than 2 seconds.  Psychiatric: She has a normal mood and affect. Her behavior is normal. Judgment and thought content normal.  Nursing note and vitals reviewed.   Ortho Exam Left knee exam shows no joint effusion.  Collaterals and cruciates stable.  Normal range of motion. Specialty Comments:  No specialty comments available.  Imaging: No results found.   PMFS History: Patient Active Problem List   Diagnosis Date Noted  . Upper airway cough syndrome 02/12/2018  . Bronchiectasis without complication ([place]) 49/70/2637  . Type 2 diabetes mellitus with neurological complications ([place]) 85/88/5027  . Diabetic neuropathy ([place]) 04/02/2017  . COPD (chronic obstructive pulmonary disease) ([place]) 04/02/2017  . Aortic valve sclerosis 04/02/2017  . Pulmonary nodule 05/21/2013  . IRRITABLE BOWEL SYNDROME 05/18/2008  . Hyperlipidemia 05/17/2008  . Essential hypertension 05/17/2008  . HEMORRHOIDS 05/17/2008  . GERD 05/17/2008  . FATTY LIVER  DISEASE 05/17/2008  . PSORIASIS 05/17/2008  . Osteoarthritis 05/17/2008  . STRESS INCONTINENCE 05/17/2008   Past Medical History:  Diagnosis Date  . Allergy   . Anxiety   . Arthritis    hands, knees  . Cancer ([place])   . Cataract    surgery to remove -bilateral  . COPD (chronic obstructive pulmonary disease) ([place])   . Depression   . Diabetes mellitus ([place])    a. A1c 6.6 in 05/2013 indicating new dx.  . Fatty liver   . Female stress incontinence   . GERD (gastroesophageal reflux disease)    . Heart murmur    no problems  . Hemorrhoids   . History of kidney stones    surgery to remove  . Hyperlipidemia   . Hyperplastic colon polyp 06/07/2008  . Hypertension   . Hypertriglyceridemia   . IBS (irritable bowel syndrome)   . Lung nodule    a. 70mm left lung nodule by CT 05/2013.  Marland Kitchen Neuromuscular disorder ([facility])    neuropathy feet  . Obesity   . Psoriasis   . Skin cancer     Family History  Problem Relation Age of Onset  . Breast cancer Mother   . Cancer Mother   . Heart disease Maternal Grandfather   . Colon polyps Maternal Aunt   . Kidney cancer Neg Hx   . Bladder Cancer Neg Hx   . Colon cancer Neg Hx   . Stomach cancer Neg Hx   . Rectal cancer Neg Hx     Past Surgical History:  Procedure Laterality Date  . CATARACT EXTRACTION W/PHACO Left 10/06/2014   Procedure: CATARACT EXTRACTION PHACO AND INTRAOCULAR LENS PLACEMENT LEFT EYE;  Surgeon: Tonny Branch, MD;  Location: [facility];  Service: Ophthalmology;  Laterality: Left;  CDE 7.35  . CATARACT EXTRACTION W/PHACO Right 11/14/2014   Procedure: CATARACT EXTRACTION PHACO AND INTRAOCULAR LENS PLACEMENT RIGHT EYE;  Surgeon: Tonny Branch, MD;  Location: [facility];  Service: Ophthalmology;  Laterality: Right;  CDE:6.39  . COLONOSCOPY  06/2008   hx polyps  . CYSTOSCOPY W/ RETROGRADES Right 01/21/2017   Procedure: CYSTOSCOPY WITH RETROGRADE PYELOGRAM;  Surgeon: Hollice Espy, MD;  Location: [facility];  Service: Urology;  Laterality: Right;  . CYSTOSCOPY/URETEROSCOPY/HOLMIUM LASER/STENT PLACEMENT Left 01/21/2017   Procedure: CYSTOSCOPY/URETEROSCOPY/HOLMIUM LASER/STENT PLACEMENT;  Surgeon: Hollice Espy, MD;  Location: [facility];  Service: Urology;  Laterality: Left;  . DORSAL COMPARTMENT RELEASE Left 06/15/2015   Procedure: LEFT WRIST DEQUERVAINS;  Surgeon: Ninetta Lights, MD;  Location: [facility];  Service: Orthopedics;  Laterality: Left;  . HERNIA REPAIR    . MOUTH SURGERY     tooth ext  . NASAL SINUS SURGERY    .  SHOULDER SURGERY     right  . TONSILLECTOMY    . UPPER GI ENDOSCOPY     normal per patient  . VENTRAL HERNIA REPAIR     Social History   Occupational History  . Occupation: retired  Tobacco Use  . Smoking status: Never Smoker  . Smokeless tobacco: Never Used  Substance and Sexual Activity  . Alcohol use: No  . Drug use: No  . Sexual activity: Yes    Birth control/protection: Post-menopausal

## 2018-07-29 ENCOUNTER — Encounter: Payer: Self-pay | Admitting: Podiatry

## 2018-07-29 ENCOUNTER — Ambulatory Visit (INDEPENDENT_AMBULATORY_CARE_PROVIDER_SITE_OTHER): Payer: Medicare Other | Admitting: Podiatry

## 2018-07-29 DIAGNOSIS — G629 Polyneuropathy, unspecified: Secondary | ICD-10-CM | POA: Diagnosis not present

## 2018-07-29 NOTE — Progress Notes (Signed)
She presents today for follow-up bilateral feet she states that the injections do not seem to be working so she does not want to continue with those any longer.  She is talking about working with Dr. Primus Bravo to change her gabapentin to Lyrica.

## 2018-08-11 ENCOUNTER — Other Ambulatory Visit: Payer: Self-pay | Admitting: Podiatry

## 2018-08-11 DIAGNOSIS — E11610 Type 2 diabetes mellitus with diabetic neuropathic arthropathy: Secondary | ICD-10-CM | POA: Diagnosis not present

## 2018-08-11 DIAGNOSIS — Z5181 Encounter for therapeutic drug level monitoring: Secondary | ICD-10-CM | POA: Diagnosis not present

## 2018-08-11 DIAGNOSIS — Z79891 Long term (current) use of opiate analgesic: Secondary | ICD-10-CM | POA: Diagnosis not present

## 2018-08-11 DIAGNOSIS — G894 Chronic pain syndrome: Secondary | ICD-10-CM | POA: Diagnosis not present

## 2018-09-08 DIAGNOSIS — E11610 Type 2 diabetes mellitus with diabetic neuropathic arthropathy: Secondary | ICD-10-CM | POA: Diagnosis not present

## 2018-09-08 DIAGNOSIS — G894 Chronic pain syndrome: Secondary | ICD-10-CM | POA: Diagnosis not present

## 2018-09-08 DIAGNOSIS — Z5181 Encounter for therapeutic drug level monitoring: Secondary | ICD-10-CM | POA: Diagnosis not present

## 2018-09-08 DIAGNOSIS — Z79891 Long term (current) use of opiate analgesic: Secondary | ICD-10-CM | POA: Diagnosis not present

## 2018-09-23 ENCOUNTER — Encounter: Payer: Self-pay | Admitting: Family Medicine

## 2018-09-23 ENCOUNTER — Ambulatory Visit (INDEPENDENT_AMBULATORY_CARE_PROVIDER_SITE_OTHER): Payer: Medicare Other | Admitting: Family Medicine

## 2018-09-23 ENCOUNTER — Other Ambulatory Visit: Payer: Self-pay

## 2018-09-23 VITALS — BP 122/64 | HR 62 | Temp 98.0°F | Resp 14 | Ht 62.0 in | Wt 192.0 lb

## 2018-09-23 DIAGNOSIS — I10 Essential (primary) hypertension: Secondary | ICD-10-CM | POA: Diagnosis not present

## 2018-09-23 DIAGNOSIS — J42 Unspecified chronic bronchitis: Secondary | ICD-10-CM

## 2018-09-23 DIAGNOSIS — E782 Mixed hyperlipidemia: Secondary | ICD-10-CM

## 2018-09-23 DIAGNOSIS — E1149 Type 2 diabetes mellitus with other diabetic neurological complication: Secondary | ICD-10-CM | POA: Diagnosis not present

## 2018-09-23 DIAGNOSIS — E1142 Type 2 diabetes mellitus with diabetic polyneuropathy: Secondary | ICD-10-CM | POA: Diagnosis not present

## 2018-09-23 NOTE — Patient Instructions (Addendum)
TDAP sent to pharmacy  Call the Eye Doctor  We will call with lab results  F/U 4 months

## 2018-09-23 NOTE — Progress Notes (Signed)
   Subjective:    Patient ID: Abigail [name], female    DOB: Nov 24, 1950, 67 y.o.   MRN: [id number]  Patient presents for Follow-up (is fasting)     Pt here to f/u chronic medical problems      OA knee followed by Dr. Erlinda Hong, had steroid injection, planning for MRI in January    Diabetes- last A1C 6.5%, taking metformin  1000mg  once a day , CBG 130-140 fasting      Hyperloipidemia- was prescribed  Tricor, last TG  280 in July , there was a confusion at the drug store so she hasnt been on this    HTN- taking lisinopril without any difficulty      Pain clinic- still Dr. Primus Bravo, gabapentin, oxycodone and cymbalta         Review Of Systems:  GEN- denies fatigue, fever, weight loss,weakness, recent illness HEENT- denies eye drainage, change in vision, nasal discharge, CVS- denies chest pain, palpitations RESP- denies SOB, cough, wheeze ABD- denies N/V, change in stools, abd pain GU- denies dysuria, hematuria, dribbling, incontinence MSK- + joint pain, muscle aches, injury Neuro- denies headache, dizziness, syncope, seizure activity       Objective:    BP 122/64   Pulse 62   Temp 98 F (36.7 C) (Oral)   Resp 14   Ht 5\' 2"  (1.575 m)   Wt 192 lb (87.1 kg)   SpO2 94%   BMI 35.12 kg/m  GEN- NAD, alert and oriented x3 HEENT- PERRL, EOMI, non injected sclera, pink conjunctiva, MMM, oropharynx clear Neck- Supple, no thyromegaly CVS- RRR, 3/6 SEM RESP-CTAB ABD-NABS,soft,NT,ND EXT- No edema Pulses- Radial, DP- 2+        Assessment & Plan:      Problem List Items Addressed This Visit      Unprioritized   COPD (chronic obstructive pulmonary disease) ([place]) - Primary    currently stable, has albuterol at home, rare use Declines flu shot      Diabetic neuropathy ([place])   Essential hypertension    Well controlled no changes      Hyperlipidemia    Recheck lipids, restart tricor pending results Does not tolerate statins      Relevant Orders   Lipid panel (Completed)    Type 2 diabetes mellitus with neurological complications ([place])    Diabetes has been well controlled Check A1C Pt to schedule with eye doctor Continue MTF 1000mg  daily Goal A1C < 7%      Relevant Orders   CBC with Differential/Platelet (Completed)   Comprehensive metabolic panel (Completed)   Hemoglobin A1c (Completed)      Note: This dictation was prepared with Dragon dictation along with smaller phrase technology. Any transcriptional errors that result from this process are unintentional.

## 2018-09-24 ENCOUNTER — Other Ambulatory Visit: Payer: Self-pay | Admitting: *Deleted

## 2018-09-24 ENCOUNTER — Encounter: Payer: Self-pay | Admitting: Family Medicine

## 2018-09-24 LAB — CBC WITH DIFFERENTIAL/PLATELET
Basophils Absolute: 39 cells/uL (ref 0–200)
Basophils Relative: 0.5 %
Eosinophils Absolute: 476 cells/uL (ref 15–500)
Eosinophils Relative: 6.1 %
HEMATOCRIT: 40.8 % (ref 35.0–45.0)
HEMOGLOBIN: 14 g/dL (ref 11.7–15.5)
LYMPHS ABS: 1973 {cells}/uL (ref 850–3900)
MCH: 29.4 pg (ref 27.0–33.0)
MCHC: 34.3 g/dL (ref 32.0–36.0)
MCV: 85.5 fL (ref 80.0–100.0)
MPV: 11.1 fL (ref 7.5–12.5)
Monocytes Relative: 6.8 %
NEUTROS ABS: 4781 {cells}/uL (ref 1500–7800)
Neutrophils Relative %: 61.3 %
Platelets: 219 10*3/uL (ref 140–400)
RBC: 4.77 10*6/uL (ref 3.80–5.10)
RDW: 13.1 % (ref 11.0–15.0)
Total Lymphocyte: 25.3 %
WBC: 7.8 10*3/uL (ref 3.8–10.8)
WBCMIX: 530 {cells}/uL (ref 200–950)

## 2018-09-24 LAB — COMPREHENSIVE METABOLIC PANEL
AG RATIO: 1.6 (calc) (ref 1.0–2.5)
ALBUMIN MSPROF: 4.1 g/dL (ref 3.6–5.1)
ALT: 14 U/L (ref 6–29)
AST: 20 U/L (ref 10–35)
Alkaline phosphatase (APISO): 74 U/L (ref 33–130)
BILIRUBIN TOTAL: 0.9 mg/dL (ref 0.2–1.2)
BUN: 9 mg/dL (ref 7–25)
CALCIUM: 9.2 mg/dL (ref 8.6–10.4)
CHLORIDE: 104 mmol/L (ref 98–110)
CO2: 27 mmol/L (ref 20–32)
Creat: 0.67 mg/dL (ref 0.50–0.99)
GLOBULIN: 2.6 g/dL (ref 1.9–3.7)
Glucose, Bld: 129 mg/dL — ABNORMAL HIGH (ref 65–99)
POTASSIUM: 4.2 mmol/L (ref 3.5–5.3)
SODIUM: 141 mmol/L (ref 135–146)
Total Protein: 6.7 g/dL (ref 6.1–8.1)

## 2018-09-24 LAB — LIPID PANEL
CHOL/HDL RATIO: 4.8 (calc) (ref <5.0)
Cholesterol: 239 mg/dL — ABNORMAL HIGH (ref <200)
HDL: 50 mg/dL — ABNORMAL LOW (ref >50)
LDL Cholesterol (Calc): 156 mg/dL (calc) — ABNORMAL HIGH
NON-HDL CHOLESTEROL (CALC): 189 mg/dL — AB (ref <130)
Triglycerides: 189 mg/dL — ABNORMAL HIGH (ref <150)

## 2018-09-24 LAB — HEMOGLOBIN A1C
Hgb A1c MFr Bld: 6.7 % of total Hgb — ABNORMAL HIGH (ref <5.7)
MEAN PLASMA GLUCOSE: 146 (calc)
eAG (mmol/L): 8.1 (calc)

## 2018-09-24 MED ORDER — FENOFIBRATE 145 MG PO TABS: 145.0000 mg | ORAL_TABLET | Freq: Every day | ORAL | 3 refills | Status: DC

## 2018-09-24 NOTE — Assessment & Plan Note (Signed)
Diabetes has been well controlled Check A1C Pt to schedule with eye doctor Continue MTF 1000mg  daily Goal A1C < 7%

## 2018-09-24 NOTE — Assessment & Plan Note (Signed)
currently stable, has albuterol at home, rare use Declines flu shot

## 2018-09-24 NOTE — Assessment & Plan Note (Signed)
Recheck lipids, restart tricor pending results Does not tolerate statins

## 2018-09-24 NOTE — Assessment & Plan Note (Signed)
Well controlled no changes 

## 2018-10-13 DIAGNOSIS — G894 Chronic pain syndrome: Secondary | ICD-10-CM | POA: Diagnosis not present

## 2018-10-13 DIAGNOSIS — E11610 Type 2 diabetes mellitus with diabetic neuropathic arthropathy: Secondary | ICD-10-CM | POA: Diagnosis not present

## 2018-10-13 DIAGNOSIS — Z5181 Encounter for therapeutic drug level monitoring: Secondary | ICD-10-CM | POA: Diagnosis not present

## 2018-10-13 DIAGNOSIS — Z79891 Long term (current) use of opiate analgesic: Secondary | ICD-10-CM | POA: Diagnosis not present

## 2018-10-14 ENCOUNTER — Encounter: Payer: Self-pay | Admitting: Family Medicine

## 2018-10-14 ENCOUNTER — Ambulatory Visit (INDEPENDENT_AMBULATORY_CARE_PROVIDER_SITE_OTHER): Payer: Medicare Other | Admitting: Family Medicine

## 2018-10-14 VITALS — BP 134/70 | HR 79 | Temp 98.4°F | Resp 16 | Ht 62.0 in | Wt 192.1 lb

## 2018-10-14 DIAGNOSIS — J441 Chronic obstructive pulmonary disease with (acute) exacerbation: Secondary | ICD-10-CM | POA: Diagnosis not present

## 2018-10-14 DIAGNOSIS — J329 Chronic sinusitis, unspecified: Secondary | ICD-10-CM

## 2018-10-14 MED ORDER — ALBUTEROL SULFATE HFA 108 (90 BASE) MCG/ACT IN AERS: 2.0000 | INHALATION_SPRAY | RESPIRATORY_TRACT | 0 refills | Status: DC | PRN

## 2018-10-14 MED ORDER — PREDNISONE 20 MG PO TABS: 40.0000 mg | ORAL_TABLET | Freq: Every day | ORAL | 0 refills | Status: AC

## 2018-10-14 MED ORDER — GUAIFENESIN ER 600 MG PO TB12: 600.0000 mg | ORAL_TABLET | Freq: Two times a day (BID) | ORAL | 0 refills | Status: AC

## 2018-10-14 MED ORDER — AZITHROMYCIN 250 MG PO TABS: ORAL_TABLET | ORAL | 0 refills | Status: DC

## 2018-10-14 NOTE — Progress Notes (Signed)
Patient ID: Abigail [name], female    DOB: 1951-06-08, 67 y.o.   MRN: [id number]  PCP: Alycia Rossetti, MD  Chief Complaint  Patient presents with  . Cough    Patient has c/o productive cough, sinus pressure, fever.    Subjective:   Abigail [name] is a 67 y.o. female, presents to clinic with CC of head and chest congestion for about 1-1/2 to 2 months.  She states that her sinus pain and pressure, nasal discharge started in October and for the past 2 to 3 weeks has been causing more chest congestion, productive cough with green sputum, worsening wheeze and shortness of breath.  She does have a history of COPD.  She has not been using her inhalers more but feels that she needs to.  She reports subjective fever and intermittent hot cold chills, no night sweats, no chest pain, no weight loss or hemoptysis.  Currently she does not have any severe facial pain, sinus pain sore throat headaches or neck pain.  She does continue to have nasal discharge and a little bit of nasal congestion but she is not currently treating with any over-the-counter medicines or allergy medications. She has no history of CHF, states that her diabetes is well controlled with no recent concerning hyperglycemia.   Patient Active Problem List   Diagnosis Date Noted  . Upper airway cough syndrome 02/12/2018  . Bronchiectasis without complication ([place]) 78/29/5621  . Type 2 diabetes mellitus with neurological complications ([place]) 30/86/5784  . Diabetic neuropathy ([place]) 04/02/2017  . COPD (chronic obstructive pulmonary disease) ([place]) 04/02/2017  . Aortic valve sclerosis 04/02/2017  . Pulmonary nodule 05/21/2013  . IRRITABLE BOWEL SYNDROME 05/18/2008  . Hyperlipidemia 05/17/2008  . Essential hypertension 05/17/2008  . HEMORRHOIDS 05/17/2008  . GERD 05/17/2008  . FATTY LIVER DISEASE 05/17/2008  . PSORIASIS 05/17/2008  . Osteoarthritis 05/17/2008  . STRESS INCONTINENCE 05/17/2008     Prior to Admission medications     Medication Sig Start Date End Date Taking? Authorizing Provider  COSENTYX SENSOREADY 300 DOSE 150 MG/ML SOAJ  04/13/18  Yes [provider]  DULoxetine (CYMBALTA) 30 MG capsule take 1 capsule by mouth once daily (IF TOLERATED) 05/07/17  Yes [provider]  fenofibrate (TRICOR) 145 MG tablet Take 1 tablet (145 mg total) by mouth daily. 09/24/18  Yes [place], Modena Nunnery, MD  fluticasone [facility]) 50 MCG/ACT nasal spray Place 2 sprays into both nostrils daily as needed for allergies. 12/24/17  Yes [place], Modena Nunnery, MD  gabapentin (NEURONTIN) 300 MG capsule TAKE 3 CAPSULES BY MOUTH THREE TIMES DAILY 08/11/18  Yes Hyatt, Max T, DPM  glucose blood (FREESTYLE LITE) test strip Use as instructed to monitor FSBS 1x daily. Dx: E11.9 12/24/17  Yes [place], Modena Nunnery, MD  glucose monitoring kit (FREESTYLE) monitoring kit 1 each by Does not apply route as needed for other. Dispense one glucometer of choice, testing strips/lancets for once per day glucose checks.  QS for 1 month, 11 refills. 05/28/13  Yes Wendie Agreste, MD  hyoscyamine (LEVSIN/SL) 0.125 MG SL tablet Place 1 tablet (0.125 mg total) under the tongue every 6 (six) hours as needed. 12/26/15  Yes [place], Amy S, PA-C  lisinopril (PRINIVIL,ZESTRIL) 20 MG tablet Take 20 mg by mouth daily.   Yes [provider]  metFORMIN (GLUCOPHAGE) 1000 MG tablet Take 1 tablet (1,000 mg total) by mouth 2 (two) times daily with a meal. Patient taking differently: Take 1,000 mg by mouth at bedtime.  03/05/18  Yes [place], Modena Nunnery, MD  nystatin (MYCOSTATIN/NYSTOP) powder Apply 4 (four) times daily topically. 09/15/17  Yes [place], Modena Nunnery, MD  oxyCODONE (OXY IR/ROXICODONE) 5 MG immediate release tablet TK 1/2 TO 1 TABLET BY MOUTH 2 TO 3 TIMES PER DAY IF TOLERATED. NO NUCYNTA 09/08/18  Yes [provider]     Allergies  Allergen Reactions  . Cinnamon     Dry throat   . Statins      Family History  Problem Relation Age of Onset  .  Breast cancer Mother   . Cancer Mother   . Heart disease Maternal Grandfather   . Colon polyps Maternal Aunt   . Kidney cancer Neg Hx   . Bladder Cancer Neg Hx   . Colon cancer Neg Hx   . Stomach cancer Neg Hx   . Rectal cancer Neg Hx      Social History   Socioeconomic History  . Marital status: Married    Spouse name: Not on file  . Number of children: 2  . Years of education: Not on file  . Highest education level: Not on file  Occupational History  . Occupation: retired  Scientific laboratory technician  . Financial resource strain: Not on file  . Food insecurity:    Worry: Not on file    Inability: Not on file  . Transportation needs:    Medical: Not on file    Non-medical: Not on file  Tobacco Use  . Smoking status: Never Smoker  . Smokeless tobacco: Never Used  Substance and Sexual Activity  . Alcohol use: No  . Drug use: No  . Sexual activity: Yes    Birth control/protection: Post-menopausal  Lifestyle  . Physical activity:    Days per week: Not on file    Minutes per session: Not on file  . Stress: Not on file  Relationships  . Social connections:    Talks on phone: Not on file    Gets together: Not on file    Attends religious service: Not on file    Active member of club or organization: Not on file    Attends meetings of clubs or organizations: Not on file    Relationship status: Not on file  . Intimate partner violence:    Fear of current or ex partner: Not on file    Emotionally abused: Not on file    Physically abused: Not on file    Forced sexual activity: Not on file  Other Topics Concern  . Not on file  Social History Narrative  . Not on file     Review of Systems  Constitutional: Positive for chills and fever. Negative for activity change, appetite change, diaphoresis, fatigue and unexpected weight change.  HENT: Negative.   Eyes: Negative.   Respiratory: Positive for cough, shortness of breath and wheezing. Negative for apnea, choking, chest  tightness and stridor.   Cardiovascular: Negative.  Negative for chest pain, palpitations and leg swelling.  Gastrointestinal: Negative.  Negative for abdominal pain, diarrhea, nausea and vomiting.  Endocrine: Negative.   Genitourinary: Negative.   Musculoskeletal: Negative.   Skin: Negative.  Negative for color change and rash.  Allergic/Immunologic: Negative.  Negative for immunocompromised state.  Neurological: Negative.  Negative for dizziness and weakness.  Hematological: Negative.   Psychiatric/Behavioral: Negative.   All other systems reviewed and are negative.      Objective:    Vitals:   10/14/18 0827  BP: 134/70  Pulse: 79  Resp:  16  Temp: 98.4 F (36.9 C)  TempSrc: Oral  SpO2: 97%  Weight: 192 lb 2 oz (87.1 kg)  Height: 5' 2"  (1.575 m)      Physical Exam  Constitutional: She appears well-developed and well-nourished.  Non-toxic appearance. She does not have a sickly appearance. She does not appear ill. No distress.  Mildly ill-appearing elderly female, appears stated age, obese, no acute distress  HENT:  Head: Normocephalic and atraumatic.  Right Ear: Hearing, tympanic membrane, external ear and ear canal normal.  Left Ear: Hearing, tympanic membrane, external ear and ear canal normal.  Nose: Mucosal edema and rhinorrhea present. Right sinus exhibits no maxillary sinus tenderness and no frontal sinus tenderness. Left sinus exhibits no maxillary sinus tenderness and no frontal sinus tenderness.  Mouth/Throat: Uvula is midline and mucous membranes are normal. Mucous membranes are not pale. No uvula swelling. No oropharyngeal exudate or tonsillar abscesses.  Copious clear nasal discharge, diffuse nasal mucosal edema that is pale pink, no erythema Posterior oropharynx very mild injection, no edema or erythema or exudate, oral mucosa moist no pallor No cervical lymphadenopathy Sinus tenderness to palpation  Eyes: Pupils are equal, round, and reactive to light.  Conjunctivae are normal. Right eye exhibits no discharge. Left eye exhibits no discharge.  Neck: Normal range of motion. Neck supple. No tracheal deviation present.  Cardiovascular: Normal rate, regular rhythm, normal heart sounds and intact distal pulses. Exam reveals no gallop and no friction rub.  No murmur heard. Pulmonary/Chest: Effort normal. No stridor. No respiratory distress. She has no wheezes. She has no rhonchi. She has no rales. She exhibits no tenderness.  Intermittent coughing, no retractions accessory muscle use, coarse breath sounds and slightly diminished bilaterally at the bases, no wheeze or rales auscultated, scattered rhonchi  Abdominal: Soft. Bowel sounds are normal. She exhibits no distension.  Musculoskeletal: Normal range of motion.  Neurological: She is alert.  Skin: Skin is warm and dry. Capillary refill takes less than 2 seconds. No rash noted. She is not diaphoretic. No pallor.  Psychiatric: She has a normal mood and affect. Her behavior is normal.  Nursing note and vitals reviewed.         Assessment & Plan:      ICD-10-CM   1. Acute exacerbation of chronic obstructive pulmonary disease (COPD) (HCC) J44.1 predniSONE (DELTASONE) 20 MG tablet    azithromycin (ZITHROMAX) 250 MG tablet    albuterol (PROVENTIL HFA;VENTOLIN HFA) 108 (90 Base) MCG/ACT inhaler    guaiFENesin (MUCINEX) 600 MG 12 hr tablet  2. Rhinosinusitis J32.9 predniSONE (DELTASONE) 20 MG tablet    Patient with at least 4 to 6 weeks of URI symptoms that she states has gone to her chest causing productive cough wheeze and mild shortness of breath with subjective fever.  She does have a history of COPD in the chart and inhalers available at home but she has not been using them.  She does not have any sinus tenderness on exam today but she does have profuse nasal discharge.  Have encouraged her to use antihistamines and her Flonase to help treat the nasal symptoms, will treat her for acute  exacerbation of COPD with Z-Pak, albuterol, steroid burst encouraged to use Mucinex and follow-up if not improving in the next 1 to 2 weeks.  Also encouraged to follow-up if she has any change or acute worsening with symptoms in her chest and lungs included seek immediate follow-up: 1 or go to the ER with any severe shortness of breath not  improved with inhaler or chest pain.  Patient does appear mildly ill, nontoxic, vital signs stable, she's afebrile, doubt flu (not tested) and she is does not have any distress in the exam room.     Delsa Grana, PA-C 10/14/18 8:34 AM

## 2018-11-09 ENCOUNTER — Encounter: Payer: Self-pay | Admitting: Nurse Practitioner

## 2018-11-09 ENCOUNTER — Ambulatory Visit (INDEPENDENT_AMBULATORY_CARE_PROVIDER_SITE_OTHER): Payer: Medicare Other | Admitting: Nurse Practitioner

## 2018-11-09 VITALS — BP 134/80 | HR 80 | Ht 62.0 in | Wt 186.4 lb

## 2018-11-09 DIAGNOSIS — K219 Gastro-esophageal reflux disease without esophagitis: Secondary | ICD-10-CM

## 2018-11-09 DIAGNOSIS — R1310 Dysphagia, unspecified: Secondary | ICD-10-CM

## 2018-11-09 NOTE — Progress Notes (Signed)
ASSESSMENT / PLAN:   54. 33 old female with 52-monthhistory of solid food dysphagia / mild unintentional weight loss. Some discomfort eating until food "goes down". Rule out esophageal stricture / candida / dysmotility.   -Given that the dysphagia is occurring with every meal will go ahead and proceed with upper endoscopy with possible esophageal dilation -Advised patient to eat small bites, chew well with liquids in between bites to avoid food impaction.  2. Longstanding GERD. Occasionally requires H2 blocker or PPI for symptoms  3. Epigastric discomfort / tenderness. Etiology unclear. Further evaluation at time of EGD.    HPI:    Chief Complaint: Swallowing problems  History of COPD, DM with polyneuropathy, psoriasis, hypertension, GERD, gastritis (EGD 2013) and D-IBS.  Was previously followed by Dr. BOlevia Perches now under the care of Dr. NSilverio Decamp Colonoscopy Aug 2019 One 7 mm polyp in the transverse colon, removed with a cold snare. Resected and retrieved. - Diverticulosis in the sigmoid colon. - Non-bleeding internal hemorrhoids Polyp path-TA without HGD  Data reviewed:  Labs 09/23/2018 CBC normal CMET unremarkable (glu 129)  MAustineis here for evaluation of dysphagia, a new problem which started about 3 months ago.  Episodes have become more frequent, occurring with every meal now. Solid foods and medications feel like they hold up in her esophagus. This causes discomfort in the form of a tightening feeling in her chest until the food passes.  She has had some recent unintentional weight loss of about 5 pounds.  MNalaysiahas a history of GERD but stopped PPI a couple years ago.  For occasion symptoms she has taken Zantac as needed.  A month ago she changed to Prevacid and started taking it on a daily basis to see if it would help her swallowing issues but so far no improvement.  She complains of upper abdominal discomfort  / tenderness unrelated to eating .No associated  nausea/vomiting . No other GI or general medical complaints.    Past Medical History:  Diagnosis Date  . Allergy   . Anxiety   . Arthritis    hands, knees  . Cancer ([place]   . Cataract    surgery to remove -bilateral  . COPD (chronic obstructive pulmonary disease) ([place]   . Depression   . Diabetes mellitus ([place]    a. A1c 6.6 in 05/2013 indicating new dx.  . Fatty liver   . Female stress incontinence   . GERD (gastroesophageal reflux disease)   . Heart murmur    no problems  . Hemorrhoids   . History of kidney stones    surgery to remove  . Hyperlipidemia   . Hyperplastic colon polyp 06/07/2008  . Hypertension   . Hypertriglyceridemia   . IBS (irritable bowel syndrome)   . Lung nodule    a. 465mleft lung nodule by CT 05/2013.  . Marland Kitcheneuromuscular disorder (HCC)    neuropathy feet  . Obesity   . Psoriasis   . Skin cancer      Past Surgical History:  Procedure Laterality Date  . CATARACT EXTRACTION W/PHACO Left 10/06/2014   Procedure: CATARACT EXTRACTION PHACO AND INTRAOCULAR LENS PLACEMENT LEFT EYE;  Surgeon: KeTonny BranchMD;  Location: [facility];  Service: Ophthalmology;  Laterality: Left;  CDE 7.35  . CATARACT EXTRACTION W/PHACO Right 11/14/2014   Procedure: CATARACT EXTRACTION PHACO AND INTRAOCULAR LENS PLACEMENT RIGHT EYE;  Surgeon: KeTonny BranchMD;  Location: [facility];  Service: Ophthalmology;  Laterality: Right;  CDE:6.39  . COLONOSCOPY  06/2008   hx polyps  . CYSTOSCOPY W/ RETROGRADES Right 01/21/2017   Procedure: CYSTOSCOPY WITH RETROGRADE PYELOGRAM;  Surgeon: Hollice Espy, MD;  Location: [facility];  Service: Urology;  Laterality: Right;  . CYSTOSCOPY/URETEROSCOPY/HOLMIUM LASER/STENT PLACEMENT Left 01/21/2017   Procedure: CYSTOSCOPY/URETEROSCOPY/HOLMIUM LASER/STENT PLACEMENT;  Surgeon: Hollice Espy, MD;  Location: [facility];  Service: Urology;  Laterality: Left;  . DORSAL COMPARTMENT RELEASE Left 06/15/2015   Procedure: LEFT WRIST DEQUERVAINS;  Surgeon: Ninetta Lights, MD;   Location: [facility];  Service: Orthopedics;  Laterality: Left;  . HERNIA REPAIR    . MOUTH SURGERY     tooth ext  . NASAL SINUS SURGERY    . SHOULDER SURGERY     right  . TONSILLECTOMY    . UPPER GI ENDOSCOPY     normal per patient  . VENTRAL HERNIA REPAIR     Family History  Problem Relation Age of Onset  . Breast cancer Mother   . Cancer Mother   . Heart disease Maternal Grandfather   . Colon polyps Maternal Aunt   . Kidney cancer Neg Hx   . Bladder Cancer Neg Hx   . Colon cancer Neg Hx   . Stomach cancer Neg Hx   . Rectal cancer Neg Hx     Current Outpatient Medications  Medication Sig Dispense Refill  . albuterol (PROVENTIL HFA;VENTOLIN HFA) 108 (90 Base) MCG/ACT inhaler Inhale 2 puffs into the lungs every 4 (four) hours as needed for wheezing or shortness of breath. 1 Inhaler 0  . COSENTYX SENSOREADY 300 DOSE 150 MG/ML SOAJ     . DULoxetine (CYMBALTA) 30 MG capsule take 1 capsule by mouth once daily (IF TOLERATED)  0  . fenofibrate (TRICOR) 145 MG tablet Take 1 tablet (145 mg total) by mouth daily. 30 tablet 3  . fluticasone (FLONASE) 50 MCG/ACT nasal spray Place 2 sprays into both nostrils daily as needed for allergies. 16 g 6  . gabapentin (NEURONTIN) 300 MG capsule TAKE 3 CAPSULES BY MOUTH THREE TIMES DAILY 270 capsule 0  . glucose blood (FREESTYLE LITE) test strip Use as instructed to monitor FSBS 1x daily. Dx: E11.9 100 each 12  . glucose monitoring kit (FREESTYLE) monitoring kit 1 each by Does not apply route as needed for other. Dispense one glucometer of choice, testing strips/lancets for once per day glucose checks.  QS for 1 month, 11 refills. 1 each 0  . hyoscyamine (LEVSIN/SL) 0.125 MG SL tablet Place 1 tablet (0.125 mg total) under the tongue every 6 (six) hours as needed. 100 tablet 8  . lisinopril (PRINIVIL,ZESTRIL) 20 MG tablet Take 20 mg by mouth daily.    . metFORMIN (GLUCOPHAGE) 1000 MG tablet Take 1 tablet (1,000 mg total) by mouth 2 (two)  times daily with a meal. (Patient taking differently: Take 1,000 mg by mouth at bedtime. ) 180 tablet 3  . nystatin (MYCOSTATIN/NYSTOP) powder Apply 4 (four) times daily topically. 60 g 1  . oxyCODONE (OXY IR/ROXICODONE) 5 MG immediate release tablet TK 1/2 TO 1 TABLET BY MOUTH 2 TO 3 TIMES PER DAY IF TOLERATED. NO NUCYNTA  0   Current Facility-Administered Medications  Medication Dose Route Frequency Provider Last Rate Last Dose  . 0.9 %  sodium chloride infusion  500 mL Intravenous Once Nandigam, Venia Minks, MD       Allergies  Allergen Reactions  . Cinnamon  Dry throat   . Statins      Review of Systems: No shortness of breath.  No fever.  No urinary symptoms.  Creatinine clearance cannot be calculated (Patient's most recent lab result is older than the maximum 21 days allowed.)   Physical Exam:    Wt Readings from Last 3 Encounters:  11/09/18 186 lb 6 oz (84.5 kg)  10/14/18 192 lb 2 oz (87.1 kg)  09/23/18 192 lb (87.1 kg)    BP 134/80 (BP Location: Left Arm, Patient Position: Sitting, Cuff Size: Normal)   Pulse 80   Ht 5' 2"  (1.575 m) Comment: height measured without shoes  Wt 186 lb 6 oz (84.5 kg)   BMI 34.09 kg/m  Constitutional:  Pleasant female in no acute distress. Psychiatric: Normal mood and affect. Behavior is normal. EENT: Pupils normal.  Conjunctivae are normal. No scleral icterus.  No evidence for oral Candida Neck supple.  Cardiovascular: Normal rate, regular rhythm, + murmur. No edema Pulmonary/chest: Effort normal and breath sounds normal. No wheezing, rales or rhonchi. Abdominal: Soft, nondistended, nontender. Bowel sounds active throughout. There are no masses palpable. No hepatomegaly. Neurological: Alert and oriented to person place and time. Skin: Skin is warm and dry. No rashes noted.  Tye Savoy, NP  11/09/2018, 10:45 AM  Cc: Alycia Rossetti, MD

## 2018-11-09 NOTE — Patient Instructions (Signed)
If you are age 70 or older, your body mass index should be between 23-30. Your Body mass index is 34.09 kg/m. If this is out of the aforementioned range listed, please consider follow up with your Primary Care Provider.  If you are age 48 or younger, your body mass index should be between 19-25. Your Body mass index is 34.09 kg/m. If this is out of the aformentioned range listed, please consider follow up with your Primary Care Provider.   You have been scheduled for an endoscopy. Please follow written instructions given to you at your visit today. If you use inhalers (even only as needed), please bring them with you on the day of your procedure. Your physician has requested that you go to www.startemmi.com and enter the access code given to you at your visit today. This web site gives a general overview about your procedure. However, you should still follow specific instructions given to you by our office regarding your preparation for the procedure.  Thank you for choosing me and [facility].   Abigail Savoy, NP

## 2018-11-10 DIAGNOSIS — G894 Chronic pain syndrome: Secondary | ICD-10-CM | POA: Diagnosis not present

## 2018-11-10 DIAGNOSIS — E11610 Type 2 diabetes mellitus with diabetic neuropathic arthropathy: Secondary | ICD-10-CM | POA: Diagnosis not present

## 2018-11-10 DIAGNOSIS — Z79891 Long term (current) use of opiate analgesic: Secondary | ICD-10-CM | POA: Diagnosis not present

## 2018-11-10 DIAGNOSIS — Z5181 Encounter for therapeutic drug level monitoring: Secondary | ICD-10-CM | POA: Diagnosis not present

## 2018-11-12 NOTE — Progress Notes (Signed)
Reviewed and agree with documentation and assessment and plan. K. Veena Liron Eissler , MD   

## 2018-11-17 ENCOUNTER — Encounter: Payer: Self-pay | Admitting: Gastroenterology

## 2018-11-17 ENCOUNTER — Ambulatory Visit (AMBULATORY_SURGERY_CENTER): Payer: Medicare Other | Admitting: Gastroenterology

## 2018-11-17 VITALS — BP 118/60 | HR 56 | Temp 98.6°F | Resp 14 | Ht 62.0 in | Wt 186.0 lb

## 2018-11-17 DIAGNOSIS — K219 Gastro-esophageal reflux disease without esophagitis: Secondary | ICD-10-CM | POA: Diagnosis not present

## 2018-11-17 DIAGNOSIS — K222 Esophageal obstruction: Secondary | ICD-10-CM

## 2018-11-17 DIAGNOSIS — B3789 Other sites of candidiasis: Secondary | ICD-10-CM | POA: Diagnosis not present

## 2018-11-17 DIAGNOSIS — R1310 Dysphagia, unspecified: Secondary | ICD-10-CM

## 2018-11-17 DIAGNOSIS — K209 Esophagitis, unspecified: Secondary | ICD-10-CM | POA: Diagnosis not present

## 2018-11-17 DIAGNOSIS — E119 Type 2 diabetes mellitus without complications: Secondary | ICD-10-CM | POA: Diagnosis not present

## 2018-11-17 DIAGNOSIS — J449 Chronic obstructive pulmonary disease, unspecified: Secondary | ICD-10-CM | POA: Diagnosis not present

## 2018-11-17 MED ORDER — SODIUM CHLORIDE 0.9 % IV SOLN: 500.0000 mL | Freq: Once | INTRAVENOUS | Status: DC

## 2018-11-17 MED ORDER — FLUCONAZOLE 100 MG PO TABS: 100.0000 mg | ORAL_TABLET | Freq: Every day | ORAL | 0 refills | Status: AC

## 2018-11-17 MED ORDER — OMEPRAZOLE 40 MG PO CPDR: 40.0000 mg | DELAYED_RELEASE_CAPSULE | Freq: Two times a day (BID) | ORAL | 3 refills | Status: DC

## 2018-11-17 NOTE — Progress Notes (Signed)
Pt's states no medical or surgical changes since previsit or office visit. 

## 2018-11-17 NOTE — Progress Notes (Signed)
Report given to PACU, vss 

## 2018-11-17 NOTE — Patient Instructions (Addendum)
HANDOUT GIVEN : ESOPHAGITIS    YOU HAD AN ENDOSCOPIC PROCEDURE TODAY AT [facility]:   Refer to the procedure report that was given to you for any specific questions about what was found during the examination.  If the procedure report does not answer your questions, please call your gastroenterologist to clarify.  If you requested that your care partner not be given the details of your procedure findings, then the procedure report has been included in a sealed envelope for you to review at your convenience later.  YOU SHOULD EXPECT: Some feelings of bloating in the abdomen. Passage of more gas than usual.  Walking can help get rid of the air that was put into your GI tract during the procedure and reduce the bloating. If you had a lower endoscopy (such as a colonoscopy or flexible sigmoidoscopy) you may notice spotting of blood in your stool or on the toilet paper. If you underwent a bowel prep for your procedure, you may not have a normal bowel movement for a few days.  Please Note:  You might notice some irritation and congestion in your nose or some drainage.  This is from the oxygen used during your procedure.  There is no need for concern and it should clear up in a day or so.  SYMPTOMS TO REPORT IMMEDIATELY:    Following upper endoscopy (EGD)  Vomiting of blood or coffee ground material  New chest pain or pain under the shoulder blades  Painful or persistently difficult swallowing  New shortness of breath  Fever of 100F or higher  Black, tarry-looking stools  For urgent or emergent issues, a gastroenterologist can be reached at any hour by calling [phone].   DIET:  SOFT FOODS ONLY TODAY. ADVANCE YOUR DIET IN THE AM.  ACTIVITY:  You should plan to take it easy for the rest of today and you should NOT DRIVE or use heavy machinery until tomorrow (because of the sedation medicines used during the test).    FOLLOW UP: Our staff will call the number listed on  your records the next business day following your procedure to check on you and address any questions or concerns that you may have regarding the information given to you following your procedure. If we do not reach you, we will leave a message.  However, if you are feeling well and you are not experiencing any problems, there is no need to return our call.  We will assume that you have returned to your regular daily activities without incident.  If any biopsies were taken you will be contacted by phone or by letter within the next 1-3 weeks.  Please call us at [phone] if you have not heard about the biopsies in 3 weeks.    SIGNATURES/CONFIDENTIALITY: You and/or your care partner have signed paperwork which will be entered into your electronic medical record.  These signatures attest to the fact that that the information above on your After Visit Summary has been reviewed and is understood.  Full responsibility of the confidentiality of this discharge information lies with you and/or your care-partner.

## 2018-11-17 NOTE — Op Note (Signed)
[facility] Patient Name: Abigail [name] Procedure Date: 11/17/2018 10:39 AM MRN: [id number] Endoscopist: Mauri Pole , MD Age: 68 Referring MD:  Date of Birth: [birth year] Gender: Female Account #: [id number] Procedure:                Upper GI endoscopy Indications:              Dysphagia Medicines:                Monitored Anesthesia Care Procedure:                Pre-Anesthesia Assessment:                           - Prior to the procedure, a History and Physical                            was performed, and patient medications and                            allergies were reviewed. The patient's tolerance of                            previous anesthesia was also reviewed. The risks                            and benefits of the procedure and the sedation                            options and risks were discussed with the patient.                            All questions were answered, and informed consent                            was obtained. Prior Anticoagulants: The patient has                            taken no previous anticoagulant or antiplatelet                            agents. ASA Grade Assessment: III - A patient with                            severe systemic disease. After reviewing the risks                            and benefits, the patient was deemed in                            satisfactory condition to undergo the procedure.                           After obtaining informed consent, the endoscope was  passed under direct vision. Throughout the                            procedure, the patient's blood pressure, pulse, and                            oxygen saturations were monitored continuously. The                            Model GIF-HQ190 [id number]) scope was introduced                            through the mouth, and advanced to the second part                            of duodenum. The upper GI endoscopy  was                            accomplished without difficulty. The patient                            tolerated the procedure well. Scope In: Scope Out: Findings:                 Moderately severe esophagitis was found 25 to 35 cm                            from the incisors. Biopsies were taken with a cold                            forceps for histology.                           One benign-appearing, intrinsic mild                            (non-circumferential scarring) stenosis was found                            34 to 35 cm from the incisors. This stenosis                            measured 1.3 cm (inner diameter) x less than one cm                            (in length). The stenosis was traversed. A TTS                            dilator was passed through the scope. Dilation with                            a 13.5-14.5-15.5 mm balloon dilator was performed  to 15.5 mm.                           The stomach was normal.                           The examined duodenum was normal. Complications:            No immediate complications. Estimated Blood Loss:     Estimated blood loss was minimal. Impression:               - Moderately severe candidiasis and erosive                            esophagitis. Biopsied.                           - Benign-appearing esophageal stenosis. Dilated.                           - Normal stomach.                           - Normal examined duodenum. Recommendation:           - Patient has a contact number available for                            emergencies. The signs and symptoms of potential                            delayed complications were discussed with the                            patient. Return to normal activities tomorrow.                            Written discharge instructions were provided to the                            patient.                           - Resume previous diet.                            - Continue present medications.                           - Await pathology results.                           - Use Prilosec (omeprazole) 40 mg PO BID for 3                            months followed by once daily.                           - Diflucan (fluconazole)  100 mg PO daily for 10                            days.                           - Follow an antireflux regimen indefinitely.                           - Return to GI office at the next available                            appointment. Mauri Pole, MD 11/17/2018 10:57:13 AM This report has been signed electronically.

## 2018-11-18 ENCOUNTER — Telehealth: Payer: Self-pay

## 2018-11-18 NOTE — Telephone Encounter (Signed)
  Follow up Call-  Call back number 11/17/2018 06/05/2018  Post procedure Call Back phone  # [phone]  Permission to leave phone message Yes Yes  Some recent data might be hidden     Patient questions:  Do you have a fever, pain , or abdominal swelling? No. Pain Score  0 *  Have you tolerated food without any problems? Yes.    Have you been able to return to your normal activities? Yes.    Do you have any questions about your discharge instructions: Diet   No. Medications  No. Follow up visit  No.  Do you have questions or concerns about your Care? No.  Actions: * If pain score is 4 or above: No action needed, pain <4.

## 2018-11-26 ENCOUNTER — Telehealth: Payer: Self-pay | Admitting: Gastroenterology

## 2018-11-26 NOTE — Telephone Encounter (Signed)
Pt return your call and would like a call back. 

## 2018-11-27 NOTE — Telephone Encounter (Signed)
Spoke with the patient about her results.

## 2018-12-08 DIAGNOSIS — G894 Chronic pain syndrome: Secondary | ICD-10-CM | POA: Diagnosis not present

## 2018-12-08 DIAGNOSIS — Z5181 Encounter for therapeutic drug level monitoring: Secondary | ICD-10-CM | POA: Diagnosis not present

## 2018-12-08 DIAGNOSIS — E11610 Type 2 diabetes mellitus with diabetic neuropathic arthropathy: Secondary | ICD-10-CM | POA: Diagnosis not present

## 2018-12-08 DIAGNOSIS — Z79891 Long term (current) use of opiate analgesic: Secondary | ICD-10-CM | POA: Diagnosis not present

## 2018-12-22 ENCOUNTER — Encounter: Payer: Self-pay | Admitting: Gastroenterology

## 2018-12-22 ENCOUNTER — Ambulatory Visit (INDEPENDENT_AMBULATORY_CARE_PROVIDER_SITE_OTHER): Payer: Medicare Other | Admitting: Gastroenterology

## 2018-12-22 VITALS — BP 124/60 | HR 80 | Ht 62.0 in | Wt 188.0 lb

## 2018-12-22 DIAGNOSIS — B3781 Candidal esophagitis: Secondary | ICD-10-CM | POA: Diagnosis not present

## 2018-12-22 DIAGNOSIS — R1310 Dysphagia, unspecified: Secondary | ICD-10-CM | POA: Diagnosis not present

## 2018-12-22 NOTE — Patient Instructions (Signed)
You have been scheduled for an endoscopy. Please follow written instructions given to you at your visit today. If you use inhalers (even only as needed), please bring them with you on the day of your procedure.   If you are age 70 or older, your body mass index should be between 23-30. Your Body mass index is 34.39 kg/m. If this is out of the aforementioned range listed, please consider follow up with your Primary Care Provider.  If you are age 70 or younger, your body mass index should be between 19-25. Your Body mass index is 34.39 kg/m. If this is out of the aformentioned range listed, please consider follow up with your Primary Care Provider.    I appreciate the  opportunity to care for you  Thank You   Harl Bowie , MD

## 2018-12-22 NOTE — Progress Notes (Signed)
TEMA ALIRE    [id number]    1950-12-15  Primary Care Physician:[place], Modena Nunnery, MD  Referring Physician: Alycia Rossetti, MD [address] [place], [place] [address]  Chief complaint: Dysphagia  HPI:  68 year old female with history of diabetes, hypertension, psoriasis on chronic immunosuppressive therapy ustekinumab, Candida esophagitis here with recurrent symptoms of odynophagia and dysphagia. EGD November 17, 2018 with findings of Candida esophagitis and esophageal stricture, dilated to 15.5 mm with TTS balloon.  She was treated with oral fluconazole.  Her symptoms improved significantly for a few weeks but she feels they are coming back and is choking intermittently with solid food. Denies any vomiting, abdominal pain, change in bowel habits or rectal bleeding. Colonoscopy August 2019 with removal of transverse colon adenomatous polyp, sigmoid diverticulosis and internal hemorrhoids   Outpatient Encounter Medications as of 12/22/2018  Medication Sig  . albuterol (PROVENTIL HFA;VENTOLIN HFA) 108 (90 Base) MCG/ACT inhaler Inhale 2 puffs into the lungs every 4 (four) hours as needed for wheezing or shortness of breath.  . COSENTYX SENSOREADY 300 DOSE 150 MG/ML SOAJ   . DULoxetine (CYMBALTA) 30 MG capsule take 1 capsule by mouth once daily (IF TOLERATED)  . fenofibrate (TRICOR) 145 MG tablet Take 1 tablet (145 mg total) by mouth daily.  . fluticasone (FLONASE) 50 MCG/ACT nasal spray Place 2 sprays into both nostrils daily as needed for allergies.  Marland Kitchen gabapentin (NEURONTIN) 300 MG capsule TAKE 3 CAPSULES BY MOUTH THREE TIMES DAILY  . glucose blood (FREESTYLE LITE) test strip Use as instructed to monitor FSBS 1x daily. Dx: E11.9  . glucose monitoring kit (FREESTYLE) monitoring kit 1 each by Does not apply route as needed for other. Dispense one glucometer of choice, testing strips/lancets for once per day glucose checks.  QS for 1 month, 11 refills.  . hyoscyamine  (LEVSIN/SL) 0.125 MG SL tablet Place 1 tablet (0.125 mg total) under the tongue every 6 (six) hours as needed.  Marland Kitchen lisinopril (PRINIVIL,ZESTRIL) 20 MG tablet Take 20 mg by mouth daily.  . metFORMIN (GLUCOPHAGE) 1000 MG tablet Take 1 tablet (1,000 mg total) by mouth 2 (two) times daily with a meal. (Patient taking differently: Take 1,000 mg by mouth at bedtime. )  . nystatin (MYCOSTATIN/NYSTOP) powder Apply 4 (four) times daily topically.  Marland Kitchen omeprazole (PRILOSEC) 40 MG capsule Take 1 capsule (40 mg total) by mouth 2 (two) times daily. Take 40 mg twice a day for 3 months followed by once daily  . oxyCODONE (OXY IR/ROXICODONE) 5 MG immediate release tablet TK 1/2 TO 1 TABLET BY MOUTH 2 TO 3 TIMES PER DAY IF TOLERATED. NO NUCYNTA  . [DISCONTINUED] 0.9 %  sodium chloride infusion    No facility-administered encounter medications on file as of 12/22/2018.     Allergies as of 12/22/2018 - Review Complete 12/22/2018  Allergen Reaction Noted  . Cinnamon  01/20/2017  . Statins  04/02/2017    Past Medical History:  Diagnosis Date  . Allergy   . Anxiety   . Arthritis    hands, knees  . Cancer ([facility])   . Cataract    surgery to remove -bilateral  . COPD (chronic obstructive pulmonary disease) ([facility])   . Depression   . Diabetes mellitus ([facility])    a. A1c 6.6 in 05/2013 indicating new dx.  . Fatty liver   . Female stress incontinence   . GERD (gastroesophageal reflux disease)   . Heart murmur  no problems  . Hemorrhoids   . History of kidney stones    surgery to remove  . Hyperlipidemia   . Hyperplastic colon polyp 06/07/2008  . Hypertension   . Hypertriglyceridemia   . IBS (irritable bowel syndrome)   . Lung nodule    a. 32m left lung nodule by CT 05/2013.  .Marland KitchenNeuromuscular disorder ([facility])    neuropathy feet  . Obesity   . Psoriasis   . Skin cancer     Past Surgical History:  Procedure Laterality Date  . CATARACT EXTRACTION W/PHACO Left 10/06/2014   Procedure: CATARACT EXTRACTION PHACO  AND INTRAOCULAR LENS PLACEMENT LEFT EYE;  Surgeon: KTonny Branch MD;  Location: [facility];  Service: Ophthalmology;  Laterality: Left;  CDE 7.35  . CATARACT EXTRACTION W/PHACO Right 11/14/2014   Procedure: CATARACT EXTRACTION PHACO AND INTRAOCULAR LENS PLACEMENT RIGHT EYE;  Surgeon: KTonny Branch MD;  Location: [facility];  Service: Ophthalmology;  Laterality: Right;  CDE:6.39  . COLONOSCOPY  06/2008   hx polyps  . CYSTOSCOPY W/ RETROGRADES Right 01/21/2017   Procedure: CYSTOSCOPY WITH RETROGRADE PYELOGRAM;  Surgeon: AHollice Espy MD;  Location: [facility];  Service: Urology;  Laterality: Right;  . CYSTOSCOPY/URETEROSCOPY/HOLMIUM LASER/STENT PLACEMENT Left 01/21/2017   Procedure: CYSTOSCOPY/URETEROSCOPY/HOLMIUM LASER/STENT PLACEMENT;  Surgeon: AHollice Espy MD;  Location: [facility];  Service: Urology;  Laterality: Left;  . DORSAL COMPARTMENT RELEASE Left 06/15/2015   Procedure: LEFT WRIST DEQUERVAINS;  Surgeon: DNinetta Lights MD;  Location: [facility]  Service: Orthopedics;  Laterality: Left;  . HERNIA REPAIR    . MOUTH SURGERY     tooth ext  . NASAL SINUS SURGERY    . SHOULDER SURGERY     right  . TONSILLECTOMY    . UPPER GI ENDOSCOPY     normal per patient  . VENTRAL HERNIA REPAIR      Family History  Problem Relation Age of Onset  . Breast cancer Mother   . Cancer Mother   . Heart disease Maternal Grandfather   . Colon polyps Maternal Aunt   . Kidney cancer Neg Hx   . Bladder Cancer Neg Hx   . Colon cancer Neg Hx   . Stomach cancer Neg Hx   . Rectal cancer Neg Hx     Social History   Socioeconomic History  . Marital status: Married    Spouse name: Not on file  . Number of children: 2  . Years of education: Not on file  . Highest education level: Not on file  Occupational History  . Occupation: retired  SScientific laboratory technician . Financial resource strain: Not on file  . Food insecurity:    Worry: Not on file    Inability: Not on file  . Transportation needs:     Medical: Not on file    Non-medical: Not on file  Tobacco Use  . Smoking status: Never Smoker  . Smokeless tobacco: Never Used  Substance and Sexual Activity  . Alcohol use: No  . Drug use: No  . Sexual activity: Yes    Birth control/protection: Post-menopausal  Lifestyle  . Physical activity:    Days per week: Not on file    Minutes per session: Not on file  . Stress: Not on file  Relationships  . Social connections:    Talks on phone: Not on file    Gets together: Not on file    Attends religious service: Not on file    Active member of club or organization:  Not on file    Attends meetings of clubs or organizations: Not on file    Relationship status: Not on file  . Intimate partner violence:    Fear of current or ex partner: Not on file    Emotionally abused: Not on file    Physically abused: Not on file    Forced sexual activity: Not on file  Other Topics Concern  . Not on file  Social History Narrative  . Not on file      Review of systems: Review of Systems  Constitutional: Negative for fever and chills.  HENT: Negative.   Eyes: Negative for blurred vision.  Respiratory: Negative for cough, shortness of breath and wheezing.   Cardiovascular: Negative for chest pain and palpitations.  Gastrointestinal: as per HPI Genitourinary: Negative for dysuria, urgency, frequency and hematuria.  Musculoskeletal: Negative for myalgias, back pain and joint pain.  Skin: Negative for itching and rash.  Neurological: Negative for dizziness, tremors, focal weakness, seizures and loss of consciousness.  Endo/Heme/Allergies: Positive for seasonal allergies.  Psychiatric/Behavioral: Negative for depression, suicidal ideas and hallucinations.  All other systems reviewed and are negative.   Physical Exam: Vitals:   12/22/18 0904  BP: 124/60  Pulse: 80   Body mass index is 34.39 kg/m. Gen:      No acute distress HEENT:  EOMI, sclera anicteric Neck:     No masses; no  thyromegaly Lungs:    Clear to auscultation bilaterally; normal respiratory effort CV:         Regular rate and rhythm; no murmurs Abd:      + bowel sounds; soft, non-tender; no palpable masses, no distension Ext:    No edema; adequate peripheral perfusion Skin:      Warm and dry; no rash Neuro: alert and oriented x 3 Psych: normal mood and affect  Data Reviewed:  Reviewed labs, radiology imaging, old records and pertinent past GI work up   Assessment and Plan/Recommendations:  68 year old female with history of COPD, hypertension, diabetes, psoriasis on chronic immunosuppressive therapy with Candida esophagitis and esophageal stricture.  Recurrent odynophagia and dysphagia concern needing for recurrent Candida esophagitis Schedule for EGD and possible esophageal dilation The risks and benefits as well as alternatives of endoscopic procedure(s) have been discussed and reviewed. All questions answered. The patient agrees to proceed. Advised patient to rinse and swallow water after every meal Avoid sugary drinks and soda     Raliegh Ip Denzil Magnuson , MD [phone]    CC: Alycia Rossetti, MD

## 2018-12-23 ENCOUNTER — Telehealth: Payer: Self-pay | Admitting: Gastroenterology

## 2018-12-23 ENCOUNTER — Encounter: Payer: Self-pay | Admitting: Gastroenterology

## 2018-12-23 ENCOUNTER — Ambulatory Visit (AMBULATORY_SURGERY_CENTER): Payer: Medicare Other | Admitting: Gastroenterology

## 2018-12-23 VITALS — BP 96/66 | HR 63 | Temp 98.2°F | Resp 12

## 2018-12-23 DIAGNOSIS — B3781 Candidal esophagitis: Secondary | ICD-10-CM

## 2018-12-23 DIAGNOSIS — K222 Esophageal obstruction: Secondary | ICD-10-CM

## 2018-12-23 DIAGNOSIS — R1310 Dysphagia, unspecified: Secondary | ICD-10-CM

## 2018-12-23 DIAGNOSIS — R1319 Other dysphagia: Secondary | ICD-10-CM

## 2018-12-23 MED ORDER — SODIUM CHLORIDE 0.9 % IV SOLN: 500.0000 mL | Freq: Once | INTRAVENOUS | Status: DC

## 2018-12-23 MED ORDER — FLUCONAZOLE 100 MG PO TABS: 100.0000 mg | ORAL_TABLET | Freq: Every day | ORAL | 0 refills | Status: DC

## 2018-12-23 NOTE — Progress Notes (Signed)
Pt's states no medical or surgical changes since previsit or office visit. 

## 2018-12-23 NOTE — Telephone Encounter (Signed)
Called patient back regarding her medications before her procedure this afternoon. I advised her to NOT take her metformin this morning. I also verified that she can have clear liquids up until 10:30 am. Patient understood and had no further questions.

## 2018-12-23 NOTE — Progress Notes (Signed)
Called to room to assist during endoscopic procedure.  Patient ID and intended procedure confirmed with present staff. Received instructions for my participation in the procedure from the performing physician.  

## 2018-12-23 NOTE — Patient Instructions (Addendum)
START taking Diflucan (fluconazole) once daily for 7 days.   YOU HAD AN ENDOSCOPIC PROCEDURE TODAY AT [facility]:   Refer to the procedure report that was given to you for any specific questions about what was found during the examination.  If the procedure report does not answer your questions, please call your gastroenterologist to clarify.  If you requested that your care partner not be given the details of your procedure findings, then the procedure report has been included in a sealed envelope for you to review at your convenience later.  YOU SHOULD EXPECT: Some feelings of bloating in the abdomen. Passage of more gas than usual.  Walking can help get rid of the air that was put into your GI tract during the procedure and reduce the bloating. If you had a lower endoscopy (such as a colonoscopy or flexible sigmoidoscopy) you may notice spotting of blood in your stool or on the toilet paper. If you underwent a bowel prep for your procedure, you may not have a normal bowel movement for a few days.  Please Note:  You might notice some irritation and congestion in your nose or some drainage.  This is from the oxygen used during your procedure.  There is no need for concern and it should clear up in a day or so.  SYMPTOMS TO REPORT IMMEDIATELY:   Following upper endoscopy (EGD)  Vomiting of blood or coffee ground material  New chest pain or pain under the shoulder blades  Painful or persistently difficult swallowing  New shortness of breath  Fever of 100F or higher  Black, tarry-looking stools  For urgent or emergent issues, a gastroenterologist can be reached at any hour by calling [phone].   DIET:  We do recommend a small meal at first, but then you may proceed to your regular diet.  Drink plenty of fluids but you should avoid alcoholic beverages for 24 hours.  ACTIVITY:  You should plan to take it easy for the rest of today and you should NOT DRIVE or use heavy  machinery until tomorrow (because of the sedation medicines used during the test).    FOLLOW UP: Our staff will call the number listed on your records the next business day following your procedure to check on you and address any questions or concerns that you may have regarding the information given to you following your procedure. If we do not reach you, we will leave a message.  However, if you are feeling well and you are not experiencing any problems, there is no need to return our call.  We will assume that you have returned to your regular daily activities without incident.  If any biopsies were taken you will be contacted by phone or by letter within the next 1-3 weeks.  Please call us at [phone] if you have not heard about the biopsies in 3 weeks.    SIGNATURES/CONFIDENTIALITY: You and/or your care partner have signed paperwork which will be entered into your electronic medical record.  These signatures attest to the fact that that the information above on your After Visit Summary has been reviewed and is understood.  Full responsibility of the confidentiality of this discharge information lies with you and/or your care-partner.

## 2018-12-23 NOTE — Progress Notes (Signed)
PT taken to PACU. Monitors in place. VSS. Report given to RN. 

## 2018-12-23 NOTE — Op Note (Signed)
[facility] Patient Name: Abigail [name] Procedure Date: 12/23/2018 1:30 PM MRN: [id number] Endoscopist: Mauri Pole , MD Age: 69 Referring MD:  Date of Birth: [birth year] Gender: Female Account #: [id number] Procedure:                Upper GI endoscopy Indications:              Dysphagia Medicines:                Monitored Anesthesia Care Procedure:                Pre-Anesthesia Assessment:                           - Prior to the procedure, a History and Physical                            was performed, and patient medications and                            allergies were reviewed. The patient's tolerance of                            previous anesthesia was also reviewed. The risks                            and benefits of the procedure and the sedation                            options and risks were discussed with the patient.                            All questions were answered, and informed consent                            was obtained. Prior Anticoagulants: The patient has                            taken no previous anticoagulant or antiplatelet                            agents. ASA Grade Assessment: III - A patient with                            severe systemic disease. After reviewing the risks                            and benefits, the patient was deemed in                            satisfactory condition to undergo the procedure.                           After obtaining informed consent, the endoscope was  passed under direct vision. Throughout the                            procedure, the patient's blood pressure, pulse, and                            oxygen saturations were monitored continuously. The                            Model GIF-HQ190 [id number]) scope was introduced                            through the mouth, and advanced to the second part                            of duodenum. The upper GI endoscopy was                             accomplished without difficulty. The patient                            tolerated the procedure well. Scope In: Scope Out: Findings:                 Patchy candida esophagitis was found 30 to 38 cm                            from the incisors.                           One benign-appearing, intrinsic mild stenosis was                            found 37 to 38 cm from the incisors. This stenosis                            measured 1.6 cm (inner diameter) x less than one cm                            (in length). The stenosis was traversed. A TTS                            dilator was passed through the scope. Dilation with                            an 18-19-20 mm balloon dilator was performed to 20                            mm. The dilation site was examined following                            endoscope reinsertion and showed mild improvement  in luminal narrowing.                           Patchy mildly erythematous mucosa without bleeding                            was found in the gastric antrum and in the                            prepyloric region of the stomach.                           The first portion of the duodenum and second                            portion of the duodenum were normal. Complications:            No immediate complications. Estimated Blood Loss:     Estimated blood loss was minimal. Impression:               - Candidiasis esophagitis.                           - Benign-appearing esophageal stenosis. Dilated.                           - Erythematous mucosa in the antrum and prepyloric                            region of the stomach.                           - Normal first portion of the duodenum and second                            portion of the duodenum.                           - No specimens collected. Recommendation:           - Patient has a contact number available for                             emergencies. The signs and symptoms of potential                            delayed complications were discussed with the                            patient. Return to normal activities tomorrow.                            Written discharge instructions were provided to the                            patient.                           -  Resume previous diet.                           - Continue present medications.                           - Diflucan (fluconazole) 100 mg PO daily for 7 days.                           - Return to office in 4-6 weeks Mauri Pole, MD 12/23/2018 2:03:15 PM This report has been signed electronically.

## 2018-12-24 ENCOUNTER — Telehealth: Payer: Self-pay

## 2018-12-24 NOTE — Telephone Encounter (Signed)
  Follow up Call-  Call back number 12/23/2018 11/17/2018 06/05/2018  Post procedure Call Back phone  # [phone]  Permission to leave phone message Yes Yes Yes  Some recent data might be hidden     Patient questions:  Do you have a fever, pain , or abdominal swelling? No. Pain Score  0 *  Have you tolerated food without any problems? Yes.    Have you been able to return to your normal activities? Yes.    Do you have any questions about your discharge instructions: Diet   No. Medications  No. Follow up visit  No.  Do you have questions or concerns about your Care? No.  Actions: * If pain score is 4 or above: No action needed, pain <4.

## 2019-01-05 DIAGNOSIS — M9951 Intervertebral disc stenosis of neural canal of cervical region: Secondary | ICD-10-CM | POA: Diagnosis not present

## 2019-01-05 DIAGNOSIS — Z5181 Encounter for therapeutic drug level monitoring: Secondary | ICD-10-CM | POA: Diagnosis not present

## 2019-01-05 DIAGNOSIS — M79669 Pain in unspecified lower leg: Secondary | ICD-10-CM | POA: Diagnosis not present

## 2019-01-05 DIAGNOSIS — Z79891 Long term (current) use of opiate analgesic: Secondary | ICD-10-CM | POA: Diagnosis not present

## 2019-01-05 DIAGNOSIS — M9931 Osseous stenosis of neural canal of cervical region: Secondary | ICD-10-CM | POA: Diagnosis not present

## 2019-01-05 DIAGNOSIS — E11610 Type 2 diabetes mellitus with diabetic neuropathic arthropathy: Secondary | ICD-10-CM | POA: Diagnosis not present

## 2019-01-05 DIAGNOSIS — G894 Chronic pain syndrome: Secondary | ICD-10-CM | POA: Diagnosis not present

## 2019-01-05 DIAGNOSIS — G8929 Other chronic pain: Secondary | ICD-10-CM | POA: Diagnosis not present

## 2019-01-25 ENCOUNTER — Ambulatory Visit: Payer: Medicare Other | Admitting: Family Medicine

## 2019-02-01 ENCOUNTER — Ambulatory Visit: Payer: Medicare Other | Admitting: Gastroenterology

## 2019-03-01 ENCOUNTER — Ambulatory Visit: Payer: Medicare Other | Admitting: Family Medicine

## 2019-03-01 DIAGNOSIS — G894 Chronic pain syndrome: Secondary | ICD-10-CM | POA: Diagnosis not present

## 2019-03-01 DIAGNOSIS — Z79891 Long term (current) use of opiate analgesic: Secondary | ICD-10-CM | POA: Diagnosis not present

## 2019-03-01 DIAGNOSIS — Z5181 Encounter for therapeutic drug level monitoring: Secondary | ICD-10-CM | POA: Diagnosis not present

## 2019-03-01 DIAGNOSIS — E11610 Type 2 diabetes mellitus with diabetic neuropathic arthropathy: Secondary | ICD-10-CM | POA: Diagnosis not present

## 2019-03-24 ENCOUNTER — Other Ambulatory Visit (HOSPITAL_COMMUNITY): Payer: Self-pay | Admitting: Family Medicine

## 2019-03-24 DIAGNOSIS — Z1231 Encounter for screening mammogram for malignant neoplasm of breast: Secondary | ICD-10-CM

## 2019-03-30 DIAGNOSIS — G894 Chronic pain syndrome: Secondary | ICD-10-CM | POA: Diagnosis not present

## 2019-03-30 DIAGNOSIS — E11610 Type 2 diabetes mellitus with diabetic neuropathic arthropathy: Secondary | ICD-10-CM | POA: Diagnosis not present

## 2019-03-30 DIAGNOSIS — Z79891 Long term (current) use of opiate analgesic: Secondary | ICD-10-CM | POA: Diagnosis not present

## 2019-03-30 DIAGNOSIS — L400 Psoriasis vulgaris: Secondary | ICD-10-CM | POA: Diagnosis not present

## 2019-03-30 DIAGNOSIS — Z5181 Encounter for therapeutic drug level monitoring: Secondary | ICD-10-CM | POA: Diagnosis not present

## 2019-04-27 DIAGNOSIS — E11610 Type 2 diabetes mellitus with diabetic neuropathic arthropathy: Secondary | ICD-10-CM | POA: Diagnosis not present

## 2019-04-27 DIAGNOSIS — G894 Chronic pain syndrome: Secondary | ICD-10-CM | POA: Diagnosis not present

## 2019-04-27 DIAGNOSIS — M9951 Intervertebral disc stenosis of neural canal of cervical region: Secondary | ICD-10-CM | POA: Diagnosis not present

## 2019-04-27 DIAGNOSIS — M79609 Pain in unspecified limb: Secondary | ICD-10-CM | POA: Diagnosis not present

## 2019-04-27 DIAGNOSIS — G8929 Other chronic pain: Secondary | ICD-10-CM | POA: Diagnosis not present

## 2019-04-27 DIAGNOSIS — M9931 Osseous stenosis of neural canal of cervical region: Secondary | ICD-10-CM | POA: Diagnosis not present

## 2019-04-27 DIAGNOSIS — Z79891 Long term (current) use of opiate analgesic: Secondary | ICD-10-CM | POA: Diagnosis not present

## 2019-04-27 DIAGNOSIS — E0843 Diabetes mellitus due to underlying condition with diabetic autonomic (poly)neuropathy: Secondary | ICD-10-CM | POA: Diagnosis not present

## 2019-04-27 DIAGNOSIS — Z5181 Encounter for therapeutic drug level monitoring: Secondary | ICD-10-CM | POA: Diagnosis not present

## 2019-04-27 DIAGNOSIS — M79669 Pain in unspecified lower leg: Secondary | ICD-10-CM | POA: Diagnosis not present

## 2019-05-17 DIAGNOSIS — M5416 Radiculopathy, lumbar region: Secondary | ICD-10-CM | POA: Diagnosis not present

## 2019-05-26 DIAGNOSIS — M5416 Radiculopathy, lumbar region: Secondary | ICD-10-CM | POA: Diagnosis not present

## 2019-06-02 DIAGNOSIS — Z5181 Encounter for therapeutic drug level monitoring: Secondary | ICD-10-CM | POA: Diagnosis not present

## 2019-06-02 DIAGNOSIS — Z79891 Long term (current) use of opiate analgesic: Secondary | ICD-10-CM | POA: Diagnosis not present

## 2019-06-02 DIAGNOSIS — G894 Chronic pain syndrome: Secondary | ICD-10-CM | POA: Diagnosis not present

## 2019-06-02 DIAGNOSIS — E11610 Type 2 diabetes mellitus with diabetic neuropathic arthropathy: Secondary | ICD-10-CM | POA: Diagnosis not present

## 2019-06-04 DIAGNOSIS — M25561 Pain in right knee: Secondary | ICD-10-CM | POA: Diagnosis not present

## 2019-06-07 DIAGNOSIS — M25561 Pain in right knee: Secondary | ICD-10-CM | POA: Diagnosis not present

## 2019-06-30 DIAGNOSIS — M9951 Intervertebral disc stenosis of neural canal of cervical region: Secondary | ICD-10-CM | POA: Diagnosis not present

## 2019-06-30 DIAGNOSIS — G8929 Other chronic pain: Secondary | ICD-10-CM | POA: Diagnosis not present

## 2019-06-30 DIAGNOSIS — M9931 Osseous stenosis of neural canal of cervical region: Secondary | ICD-10-CM | POA: Diagnosis not present

## 2019-06-30 DIAGNOSIS — M79609 Pain in unspecified limb: Secondary | ICD-10-CM | POA: Diagnosis not present

## 2019-07-05 ENCOUNTER — Other Ambulatory Visit: Payer: Self-pay | Admitting: Family Medicine

## 2019-07-05 MED ORDER — METFORMIN HCL 1000 MG PO TABS: 1000.0000 mg | ORAL_TABLET | Freq: Every day | ORAL | 0 refills | Status: DC

## 2019-07-05 NOTE — Telephone Encounter (Signed)
Patient calling to check the status of her refill on her metformin she would like this called into [facility] [address] in [place]. She hasn't had this medication for 3 days now.  CB# [phone]

## 2019-07-09 ENCOUNTER — Encounter: Payer: Self-pay | Admitting: Family Medicine

## 2019-07-09 ENCOUNTER — Other Ambulatory Visit: Payer: Self-pay

## 2019-07-09 ENCOUNTER — Ambulatory Visit (INDEPENDENT_AMBULATORY_CARE_PROVIDER_SITE_OTHER): Payer: Medicare Other | Admitting: Family Medicine

## 2019-07-09 VITALS — BP 110/60 | HR 88 | Temp 98.3°F | Resp 18 | Ht 62.0 in | Wt 178.8 lb

## 2019-07-09 DIAGNOSIS — E1142 Type 2 diabetes mellitus with diabetic polyneuropathy: Secondary | ICD-10-CM | POA: Diagnosis not present

## 2019-07-09 DIAGNOSIS — I358 Other nonrheumatic aortic valve disorders: Secondary | ICD-10-CM

## 2019-07-09 DIAGNOSIS — K219 Gastro-esophageal reflux disease without esophagitis: Secondary | ICD-10-CM

## 2019-07-09 DIAGNOSIS — M150 Primary generalized (osteo)arthritis: Secondary | ICD-10-CM | POA: Diagnosis not present

## 2019-07-09 DIAGNOSIS — I10 Essential (primary) hypertension: Secondary | ICD-10-CM | POA: Diagnosis not present

## 2019-07-09 DIAGNOSIS — M159 Polyosteoarthritis, unspecified: Secondary | ICD-10-CM

## 2019-07-09 DIAGNOSIS — E1149 Type 2 diabetes mellitus with other diabetic neurological complication: Secondary | ICD-10-CM

## 2019-07-09 DIAGNOSIS — M8949 Other hypertrophic osteoarthropathy, multiple sites: Secondary | ICD-10-CM

## 2019-07-09 MED ORDER — FREESTYLE LITE TEST VI STRP: ORAL_STRIP | 12 refills | Status: DC

## 2019-07-09 MED ORDER — METFORMIN HCL 1000 MG PO TABS: 1000.0000 mg | ORAL_TABLET | Freq: Every day | ORAL | 1 refills | Status: DC

## 2019-07-09 MED ORDER — LISINOPRIL 20 MG PO TABS: 20.0000 mg | ORAL_TABLET | Freq: Every day | ORAL | 1 refills | Status: DC

## 2019-07-09 MED ORDER — FREESTYLE LANCETS MISC: 12 refills | Status: AC

## 2019-07-09 NOTE — Assessment & Plan Note (Signed)
Asymptomatic. 

## 2019-07-09 NOTE — Assessment & Plan Note (Signed)
Controlled no changes 

## 2019-07-09 NOTE — Assessment & Plan Note (Signed)
Reviewed GI note on PPI controlling symptoms

## 2019-07-09 NOTE — Assessment & Plan Note (Signed)
Diabetes has been fairly well controlled for her age 68 A1C goal < 7% with no hypoglycemia symptoms On ACE Does not tolerate statins

## 2019-07-09 NOTE — Patient Instructions (Signed)
F/U 4 months for Physical  Medications to be refilled

## 2019-07-09 NOTE — Progress Notes (Signed)
   Subjective:    Patient ID: Abigail [name], female    DOB: 05-Apr-1951, 68 y.o.   MRN: [id number]  Patient presents for Diabetes (f/u)   Pt here to f/u chronic medical problems    DM- taking 10000mg  metformin once a day,  last A1C 6.7% in Nov, CBG typically "good" did not bring meter with her  Highest 245 she had run out of metformin at that time   no hypoglycemia    GERD/Dysphagia- given nystatin for yeast buildup, also on omeprazole   HTn-taking BP med without difficulty   No difficulty with breathing     Cymbalta at night due to neuropathy also on  Gabapentin and oxy IR for severe pain - Dr. Primus Bravo , fills this   [name]- Dermatologist has her on  cosentyx for her Psoriasis   Review Of Systems:  GEN- denies fatigue, fever, weight loss,weakness, recent illness HEENT- denies eye drainage, change in vision, nasal discharge, CVS- denies chest pain, palpitations RESP- denies SOB, cough, wheeze ABD- denies N/V, change in stools, abd pain GU- denies dysuria, hematuria, dribbling, incontinence MSK- + joint pain, muscle aches, injury Neuro- denies headache, dizziness, syncope, seizure activity       Objective:    BP 110/60 (BP Location: Right Arm, Patient Position: Sitting, Cuff Size: Normal)   Pulse 88   Temp 98.3 F (36.8 C) (Oral)   Resp 18   Ht 5\' 2"  (1.575 m)   Wt 178 lb 12.8 oz (81.1 kg)   SpO2 94%   BMI 32.70 kg/m  GEN- NAD, alert and oriented x3 HEENT- PERRL, EOMI, non injected sclera, pink conjunctiva, MMM, oropharynx clear Neck- Supple, no thyromegaly CVS- RRR, systolic  3/6  murmur RESP-CTAB ABD-NABS,soft,NT,ND EXT- No edema Pulses- Radial, DP- 2+        Assessment & Plan:      Problem List Items Addressed This Visit      Unprioritized   Aortic valve sclerosis    Asymptomatic       Relevant Medications   lisinopril (ZESTRIL) 20 MG tablet   Diabetic neuropathy (HCC)   Relevant Medications   metFORMIN (GLUCOPHAGE) 1000 MG tablet   lisinopril (ZESTRIL) 20 MG tablet   Essential hypertension    Controlled no changes       Relevant Medications   lisinopril (ZESTRIL) 20 MG tablet   Other Relevant Orders   Lipid panel   GERD    Reviewed GI note on PPI controlling symptoms      Osteoarthritis   Type 2 diabetes mellitus with neurological complications ([place]) - Primary    Diabetes has been fairly well controlled for her age Recheck A1C goal < 7% with no hypoglycemia symptoms On ACE Does not tolerate statins      Relevant Medications   metFORMIN (GLUCOPHAGE) 1000 MG tablet   lisinopril (ZESTRIL) 20 MG tablet   Other Relevant Orders   Hemoglobin A1c   COMPLETE METABOLIC PANEL WITH GFR   Lipid panel   Microalbumin / creatinine urine ratio   HM DIABETES FOOT EXAM (Completed)      Note: This dictation was prepared with Dragon dictation along with smaller phrase technology. Any transcriptional errors that result from this process are unintentional.

## 2019-07-10 LAB — COMPLETE METABOLIC PANEL WITH GFR
AG Ratio: 1.7 (calc) (ref 1.0–2.5)
ALT: 16 U/L (ref 6–29)
AST: 23 U/L (ref 10–35)
Albumin: 4.4 g/dL (ref 3.6–5.1)
Alkaline phosphatase (APISO): 89 U/L (ref 37–153)
BUN: 14 mg/dL (ref 7–25)
CO2: 25 mmol/L (ref 20–32)
Calcium: 9.8 mg/dL (ref 8.6–10.4)
Chloride: 104 mmol/L (ref 98–110)
Creat: 0.89 mg/dL (ref 0.60–0.93)
GFR, Est African American: 76 mL/min/{1.73_m2} (ref >=60)
GFR, Est Non African American: 66 mL/min/{1.73_m2} (ref >=60)
Globulin: 2.6 g/dL (calc) (ref 1.9–3.7)
Glucose, Bld: 100 mg/dL — ABNORMAL HIGH (ref 65–99)
Potassium: 4.6 mmol/L (ref 3.5–5.3)
Sodium: 142 mmol/L (ref 135–146)
Total Bilirubin: 1.1 mg/dL (ref 0.2–1.2)
Total Protein: 7 g/dL (ref 6.1–8.1)

## 2019-07-10 LAB — LIPID PANEL
Cholesterol: 274 mg/dL — ABNORMAL HIGH (ref <200)
HDL: 48 mg/dL — ABNORMAL LOW (ref >=50)
LDL Cholesterol (Calc): 189 mg/dL (calc) — ABNORMAL HIGH
Non-HDL Cholesterol (Calc): 226 mg/dL (calc) — ABNORMAL HIGH (ref <130)
Total CHOL/HDL Ratio: 5.7 (calc) — ABNORMAL HIGH (ref <5.0)
Triglycerides: 195 mg/dL — ABNORMAL HIGH (ref <150)

## 2019-07-10 LAB — HEMOGLOBIN A1C
Hgb A1c MFr Bld: 6.6 % of total Hgb — ABNORMAL HIGH (ref <5.7)
Mean Plasma Glucose: 143 (calc)
eAG (mmol/L): 7.9 (calc)

## 2019-07-10 LAB — MICROALBUMIN / CREATININE URINE RATIO
Creatinine, Urine: 417 mg/dL — ABNORMAL HIGH (ref 20–275)
Microalb Creat Ratio: 11 mcg/mg creat (ref <30)
Microalb, Ur: 4.7 mg/dL

## 2019-07-13 NOTE — Telephone Encounter (Signed)
Medication was refilled on 07/09/2019.

## 2019-07-15 ENCOUNTER — Other Ambulatory Visit: Payer: Self-pay | Admitting: *Deleted

## 2019-07-15 MED ORDER — EZETIMIBE 10 MG PO TABS: 10.0000 mg | ORAL_TABLET | Freq: Every day | ORAL | 3 refills | Status: DC

## 2019-07-20 DIAGNOSIS — M25561 Pain in right knee: Secondary | ICD-10-CM | POA: Diagnosis not present

## 2019-07-26 ENCOUNTER — Telehealth: Payer: Self-pay

## 2019-07-26 NOTE — Telephone Encounter (Signed)
Send amoxicillin 875mg  BID x 7 days  can use nasal saline, or flonase OTC and claritin or zyrtec

## 2019-07-26 NOTE — Telephone Encounter (Signed)
Pt called and states that she was in the clinic 2 weeks ago and she had mild sinus issues which now she states it has turned into a sinus infection. Pt would like to know if you could send her in an antibiotic? Please advise.

## 2019-07-27 ENCOUNTER — Other Ambulatory Visit: Payer: Self-pay

## 2019-07-27 MED ORDER — AMOXICILLIN 875 MG PO TABS: 875.0000 mg | ORAL_TABLET | Freq: Two times a day (BID) | ORAL | 0 refills | Status: DC

## 2019-07-27 NOTE — Telephone Encounter (Signed)
Pt notified Rx sent to pharmacy

## 2019-07-28 DIAGNOSIS — M9931 Osseous stenosis of neural canal of cervical region: Secondary | ICD-10-CM | POA: Diagnosis not present

## 2019-07-28 DIAGNOSIS — G8929 Other chronic pain: Secondary | ICD-10-CM | POA: Diagnosis not present

## 2019-07-28 DIAGNOSIS — M79609 Pain in unspecified limb: Secondary | ICD-10-CM | POA: Diagnosis not present

## 2019-07-28 DIAGNOSIS — M9951 Intervertebral disc stenosis of neural canal of cervical region: Secondary | ICD-10-CM | POA: Diagnosis not present

## 2019-08-02 DIAGNOSIS — M47896 Other spondylosis, lumbar region: Secondary | ICD-10-CM | POA: Diagnosis not present

## 2019-08-02 DIAGNOSIS — M25559 Pain in unspecified hip: Secondary | ICD-10-CM | POA: Diagnosis not present

## 2019-08-02 DIAGNOSIS — M79669 Pain in unspecified lower leg: Secondary | ICD-10-CM | POA: Diagnosis not present

## 2019-08-02 DIAGNOSIS — M25519 Pain in unspecified shoulder: Secondary | ICD-10-CM | POA: Diagnosis not present

## 2019-08-02 DIAGNOSIS — M9931 Osseous stenosis of neural canal of cervical region: Secondary | ICD-10-CM | POA: Diagnosis not present

## 2019-08-02 DIAGNOSIS — M9951 Intervertebral disc stenosis of neural canal of cervical region: Secondary | ICD-10-CM | POA: Diagnosis not present

## 2019-08-02 DIAGNOSIS — G8929 Other chronic pain: Secondary | ICD-10-CM | POA: Diagnosis not present

## 2019-08-02 DIAGNOSIS — M47819 Spondylosis without myelopathy or radiculopathy, site unspecified: Secondary | ICD-10-CM | POA: Diagnosis not present

## 2019-08-02 DIAGNOSIS — M79609 Pain in unspecified limb: Secondary | ICD-10-CM | POA: Diagnosis not present

## 2019-09-14 DIAGNOSIS — M9931 Osseous stenosis of neural canal of cervical region: Secondary | ICD-10-CM | POA: Diagnosis not present

## 2019-09-14 DIAGNOSIS — G8929 Other chronic pain: Secondary | ICD-10-CM | POA: Diagnosis not present

## 2019-09-14 DIAGNOSIS — M79609 Pain in unspecified limb: Secondary | ICD-10-CM | POA: Diagnosis not present

## 2019-09-14 DIAGNOSIS — M9951 Intervertebral disc stenosis of neural canal of cervical region: Secondary | ICD-10-CM | POA: Diagnosis not present

## 2019-09-15 ENCOUNTER — Other Ambulatory Visit: Payer: Self-pay

## 2019-09-15 ENCOUNTER — Ambulatory Visit (INDEPENDENT_AMBULATORY_CARE_PROVIDER_SITE_OTHER): Payer: Medicare Other | Admitting: Family Medicine

## 2019-09-15 ENCOUNTER — Encounter: Payer: Self-pay | Admitting: Family Medicine

## 2019-09-15 VITALS — BP 120/62 | HR 91 | Temp 99.0°F | Resp 18 | Ht 62.0 in | Wt 182.4 lb

## 2019-09-15 DIAGNOSIS — T50905A Adverse effect of unspecified drugs, medicaments and biological substances, initial encounter: Secondary | ICD-10-CM | POA: Diagnosis not present

## 2019-09-15 DIAGNOSIS — H6092 Unspecified otitis externa, left ear: Secondary | ICD-10-CM | POA: Diagnosis not present

## 2019-09-15 DIAGNOSIS — L568 Other specified acute skin changes due to ultraviolet radiation: Secondary | ICD-10-CM | POA: Diagnosis not present

## 2019-09-15 DIAGNOSIS — R1310 Dysphagia, unspecified: Secondary | ICD-10-CM | POA: Diagnosis not present

## 2019-09-15 DIAGNOSIS — L408 Other psoriasis: Secondary | ICD-10-CM

## 2019-09-15 MED ORDER — CLOBETASOL PROPIONATE 0.05 % EX SOLN: 1.0000 "application " | Freq: Two times a day (BID) | CUTANEOUS | 2 refills | Status: DC

## 2019-09-15 NOTE — Patient Instructions (Signed)
Use the solution as needed to ear GI appointment to be made Stop the Consentyx F/U as previou s

## 2019-09-15 NOTE — Assessment & Plan Note (Signed)
Followed by dermatology which is why she is on the Cosentyx.  At this point we decided we will give a trial off for 2 months and see if she has recurrent hypersensitivity of the skin with heat or sunlight.  If her symptoms go away then she needs to go on a different medication for her psoriasis.  If there is no change then I would proceed with further skin testing with regards to her rashes.  Regarding the ear dermatitis her clobetasol solution was refilled

## 2019-09-15 NOTE — Progress Notes (Signed)
Subjective:    Patient ID: Abigail [name], female    DOB: Sep 06, 1951, 68 y.o.   MRN: [id number]  Patient presents for Allergic Reaction (gets hot and starts itching and red, started x2 months ago)  Pt here episodes of intermittent  (Heat induced ) rashes since July. She remembers back in July she took a bubble bath,she got out of the tub and entire back was red and itching. She took a cool shower and then the rash went away. Now if she is out in the sun or holding anything that is hot or warm, she will start itching in the spot. She sat on her Son's hot tailgate then she starting itching on her buttocks and broke with red rash  Went to the beach out In the sun and broke out on arms and started itching   She did stop the Cosentyx early on in COVID, restarted 4 months ago around the same time this started  She is also requesting a refill on her clobetasol solution associated in her ear she has an ear dermatitis with total fungal in the past.  She has used it intermittently for flares for many years and it works.  She is having recurrent problems swallowing feels like her food getting stuck in her pills again.  She did have stretching done by GI was also found to have candidiasis in the esophagus back in February. He has gained weight since our visit in September    Review Of Systems:  GEN- denies fatigue, fever, weight loss,weakness, recent illness HEENT- denies eye drainage, change in vision, nasal discharge, CVS- denies chest pain, palpitations RESP- denies SOB, cough, wheeze ABD- denies N/V, change in stools, abd pain GU- denies dysuria, hematuria, dribbling, incontinence MSK- denies joint pain, muscle aches, injury Neuro- denies headache, dizziness, syncope, seizure activity       Objective:    BP 120/62 (BP Location: Right Arm, Patient Position: Sitting, Cuff Size: Normal)   Pulse 91   Temp 99 F (37.2 C) (Oral)   Resp 18   Ht 5\' 2"  (1.575 m)   Wt 182 lb 6.4 oz (82.7 kg)    SpO2 96%   BMI 33.36 kg/m  GEN- NAD, alert and oriented x3 HEENT- PERRL, EOMI, non injected sclera, pink conjunctiva, MMM, oropharynx clear, TM clear bilaterally mild erythema swelling at the left canal with flaking no gross discharge she does have a small blackhead inside the pinna Neck- Supple, no thyromegaly CVS- RRR, systolic murmur RESP-CTAB ABD-NABS,soft,NT,ND Skin-intact no rash on the forearms or exposed skin seen. EXT- No edema Pulses- Radial, DP- 2+        Assessment & Plan:      Problem List Items Addressed This Visit      Unprioritized   Dysphagia    She will be referred back to gastroenterology for recurrent symptoms.      Relevant Orders   Ambulatory referral to Gastroenterology   PSORIASIS    Followed by dermatology which is why she is on the Cosentyx.  At this point we decided we will give a trial off for 2 months and see if she has recurrent hypersensitivity of the skin with heat or sunlight.  If her symptoms go away then she needs to go on a different medication for her psoriasis.  If there is no change then I would proceed with further skin testing with regards to her rashes.  Regarding the ear dermatitis her clobetasol solution was refilled  Other Visit Diagnoses    Otitis externa of left ear, unspecified chronicity, unspecified type    -  Primary   Drug-induced photosensitivity          Note: This dictation was prepared with Dragon dictation along with smaller phrase technology. Any transcriptional errors that result from this process are unintentional.

## 2019-09-15 NOTE — Assessment & Plan Note (Signed)
She will be referred back to gastroenterology for recurrent symptoms.

## 2019-10-12 DIAGNOSIS — M9931 Osseous stenosis of neural canal of cervical region: Secondary | ICD-10-CM | POA: Diagnosis not present

## 2019-10-12 DIAGNOSIS — M9951 Intervertebral disc stenosis of neural canal of cervical region: Secondary | ICD-10-CM | POA: Diagnosis not present

## 2019-10-12 DIAGNOSIS — M79609 Pain in unspecified limb: Secondary | ICD-10-CM | POA: Diagnosis not present

## 2019-10-12 DIAGNOSIS — G8929 Other chronic pain: Secondary | ICD-10-CM | POA: Diagnosis not present

## 2019-10-13 ENCOUNTER — Telehealth: Payer: Self-pay | Admitting: Family Medicine

## 2019-10-13 NOTE — Telephone Encounter (Signed)
Patient called requesting a refill on her metformin. [facility] [address]  CB# [phone]

## 2019-10-14 MED ORDER — METFORMIN HCL 1000 MG PO TABS: 1000.0000 mg | ORAL_TABLET | Freq: Every day | ORAL | 1 refills | Status: DC

## 2019-10-14 NOTE — Telephone Encounter (Signed)
Prescription sent to pharmacy.   [facility] removed from pharmacy list.

## 2019-11-01 ENCOUNTER — Encounter: Payer: Self-pay | Admitting: Family Medicine

## 2019-11-02 DIAGNOSIS — M47816 Spondylosis without myelopathy or radiculopathy, lumbar region: Secondary | ICD-10-CM | POA: Diagnosis not present

## 2019-11-02 DIAGNOSIS — M9973 Connective tissue and disc stenosis of intervertebral foramina of lumbar region: Secondary | ICD-10-CM | POA: Diagnosis not present

## 2019-11-02 DIAGNOSIS — G894 Chronic pain syndrome: Secondary | ICD-10-CM | POA: Diagnosis not present

## 2019-11-02 DIAGNOSIS — M9951 Intervertebral disc stenosis of neural canal of cervical region: Secondary | ICD-10-CM | POA: Diagnosis not present

## 2019-11-02 DIAGNOSIS — M179 Osteoarthritis of knee, unspecified: Secondary | ICD-10-CM | POA: Diagnosis not present

## 2019-11-02 DIAGNOSIS — E11610 Type 2 diabetes mellitus with diabetic neuropathic arthropathy: Secondary | ICD-10-CM | POA: Diagnosis not present

## 2019-11-02 DIAGNOSIS — M9931 Osseous stenosis of neural canal of cervical region: Secondary | ICD-10-CM | POA: Diagnosis not present

## 2019-11-10 ENCOUNTER — Encounter: Payer: Medicare Other | Admitting: Family Medicine

## 2019-11-10 NOTE — Progress Notes (Deleted)
Subjective:   Patient presents for Medicare Annual/Subsequent preventive examination.   DM-  Last A1c 6.6% , taking metformin twice a day , highest 179 fasting, this AM 184, throughout the day it fluctuates   HTN- on lisinopril HCTZ    Hyperlipidemia - ldl 153 IN Sept, does not tolerate statins on [place]     Eye doctor  Risk Factors  Current exercise habits:  Dietary issues discussed:   Cardiac risk factors: Obesity (BMI >= 30 kg/m2).   Depression Screen  (Note: if answer to either of the following is "Yes", a more complete depression screening is indicated)  Over the past two weeks, have you felt down, depressed or hopeless? No Over the past two weeks, have you felt little interest or pleasure in doing things? No Have you lost interest or pleasure in daily life? No Do you often feel hopeless? No Do you cry easily over simple problems? No   Activities of Daily Living  In your present state of health, do you have any difficulty performing the following activities?:  Driving? No  Managing money? No  Feeding yourself? No  Getting from bed to chair? No  Climbing a flight of stairs? No  Preparing food and eating?: No  Bathing or showering? No  Getting dressed: No  Getting to the toilet? No  Using the toilet:No  Moving around from place to place: No  In the past year have you fallen or had a near fall?:No  Are you sexually active? No  Do you have more than one partner? No   Hearing Difficulties: Sometimes   Do you often ask people to speak up or repeat themselves? Yes  Do you experience ringing or noises in your ears? No Do you have difficulty understanding soft or whispered voices? Yes   Do you feel that you have a problem with memory? No Do you often misplace items? No  Do you feel safe at home? Yes  Cognitive Testing  Alert? Yes Normal Appearance?Yes  Oriented to person? Yes Place? Yes  Time? Yes  Recall of three objects? Yes  Can perform simple calculations?  Yes  Displays appropriate judgment?Yes  Can read the correct time from a watch face?Yes   List the Names of Other Physician/Practitioners you currently use:   podiatry  Pain management Dr[facility]- Dr. [name]  Dermatology- Psoriasis    Indicate any recent Medical Services you may have received from other than [facility] providers in the past year (date may be approximate).   Screening Tests / Date Colonoscopy  Due in August 2019                   Zostavax Declines  Mammogram  Due  Influenza Vaccine Declines  Pneumonia- UTD Tetanus/tdap - due   ROS: GEN- denies fatigue, fever, weight loss,weakness, recent illness HEENT- denies eye drainage, change in vision, nasal discharge, CVS- denies chest pain, palpitations RESP- denies SOB, +cough, wheeze ABD- denies N/V, change in stools, abd pain GU- denies dysuria, hematuria, dribbling, incontinence MSK- denies joint pain, muscle aches, injury Neuro- denies headache, dizziness, syncope, seizure activity  PHYSICAL: GEN- NAD, alert and oriented x3,obese HEENT- PERRL, EOMI, non injected sclera, pink conjunctiva, MMM, oropharynx clear, TM clear bilat no effusion, nares clear Neck- Supple, no thyromegaly, no bruit CVS- RRR,Loud 3/6 systolic murmur  RESP-CTAB ABD-NABS,soft,NT,ND Psych- normal affect and mood EXT- No edema Pulses- Radial, DP- 2+  Assessment:    Annual wellness medicare exam   Plan:  During the course of the visit the patient was educated and counseled about appropriate screening and preventive services including:     Due for Mammogram/Due Bone Density pt to schedule  Due for colonoscopy in August   Screen Neg for depression  DM- has been well controlled, check urine micro,obtain eye report, goal A1C < 7%   HTN- well controlled   COPD- ongoing cough x 2 months no improvement with meds, obtain PFT, CT of chest, history of lung nodule as well   Advanced directives given   I aksed her to  please have her cardioloist send records since we have none on file.  Diet review for nutrition referral? Yes ____ Not Indicated __x__  Patient Instructions (the written plan) was given to the patient.  Medicare Attestation  I have personally reviewed:  The patient's medical and social history  Their use of alcohol, tobacco or illicit drugs  Their current medications and supplements  The patient's functional ability including ADLs,fall risks, home safety risks, cognitive, and hearing and visual impairment  Diet and physical activities  Evidence for depression or mood disorders  The patient's weight, height, BMI, and visual acuity have been recorded in the chart. I have made referrals, counseling, and provided education to the patient based on review of the above and I have provided the patient with a written personalized care plan for preventive services.

## 2019-11-29 DIAGNOSIS — G894 Chronic pain syndrome: Secondary | ICD-10-CM | POA: Diagnosis not present

## 2019-11-29 DIAGNOSIS — Z79891 Long term (current) use of opiate analgesic: Secondary | ICD-10-CM | POA: Diagnosis not present

## 2019-11-29 DIAGNOSIS — E11610 Type 2 diabetes mellitus with diabetic neuropathic arthropathy: Secondary | ICD-10-CM | POA: Diagnosis not present

## 2019-11-29 DIAGNOSIS — Z5181 Encounter for therapeutic drug level monitoring: Secondary | ICD-10-CM | POA: Diagnosis not present

## 2019-11-30 DIAGNOSIS — G894 Chronic pain syndrome: Secondary | ICD-10-CM | POA: Diagnosis not present

## 2019-11-30 DIAGNOSIS — E11610 Type 2 diabetes mellitus with diabetic neuropathic arthropathy: Secondary | ICD-10-CM | POA: Diagnosis not present

## 2019-11-30 DIAGNOSIS — Z79891 Long term (current) use of opiate analgesic: Secondary | ICD-10-CM | POA: Diagnosis not present

## 2019-11-30 DIAGNOSIS — Z5181 Encounter for therapeutic drug level monitoring: Secondary | ICD-10-CM | POA: Diagnosis not present

## 2019-12-27 DIAGNOSIS — M1990 Unspecified osteoarthritis, unspecified site: Secondary | ICD-10-CM | POA: Diagnosis not present

## 2019-12-27 DIAGNOSIS — G894 Chronic pain syndrome: Secondary | ICD-10-CM | POA: Diagnosis not present

## 2019-12-27 DIAGNOSIS — M5136 Other intervertebral disc degeneration, lumbar region: Secondary | ICD-10-CM | POA: Diagnosis not present

## 2019-12-27 DIAGNOSIS — E11610 Type 2 diabetes mellitus with diabetic neuropathic arthropathy: Secondary | ICD-10-CM | POA: Diagnosis not present

## 2019-12-28 DIAGNOSIS — E11610 Type 2 diabetes mellitus with diabetic neuropathic arthropathy: Secondary | ICD-10-CM | POA: Diagnosis not present

## 2019-12-28 DIAGNOSIS — G894 Chronic pain syndrome: Secondary | ICD-10-CM | POA: Diagnosis not present

## 2019-12-28 DIAGNOSIS — Z5181 Encounter for therapeutic drug level monitoring: Secondary | ICD-10-CM | POA: Diagnosis not present

## 2020-01-21 ENCOUNTER — Encounter: Payer: Self-pay | Admitting: Physician Assistant

## 2020-01-21 ENCOUNTER — Ambulatory Visit (INDEPENDENT_AMBULATORY_CARE_PROVIDER_SITE_OTHER): Payer: Medicare Other | Admitting: Physician Assistant

## 2020-01-21 ENCOUNTER — Other Ambulatory Visit: Payer: Self-pay

## 2020-01-21 DIAGNOSIS — L570 Actinic keratosis: Secondary | ICD-10-CM

## 2020-01-21 DIAGNOSIS — L409 Psoriasis, unspecified: Secondary | ICD-10-CM | POA: Diagnosis not present

## 2020-01-21 DIAGNOSIS — Z1283 Encounter for screening for malignant neoplasm of skin: Secondary | ICD-10-CM

## 2020-01-21 MED ORDER — COSENTYX SENSOREADY (300 MG) 150 MG/ML ~~LOC~~ SOAJ: 2.0000 "pen " | SUBCUTANEOUS | 6 refills | Status: DC

## 2020-01-21 MED ORDER — COSENTYX (300 MG DOSE) 150 MG/ML ~~LOC~~ SOSY: 300.0000 mg | PREFILLED_SYRINGE | SUBCUTANEOUS | 0 refills | Status: DC

## 2020-01-21 NOTE — Progress Notes (Signed)
   Follow-Up Visit   Subjective  Abigail [name] is a 69 y.o. female who presents for the following: Psoriasis (Patient,last seen on 05/12/2018, is currently flaring. Last Cosentyx inj was 3 months ago and PCP told her to stop because she was having sensitivity and increased redness.  Is currently flaring on her back.  Using the topicals that she has been prescribed; Clobetasol.). Skin Check  The following portions of the chart were reviewed this encounter and updated as appropriate: Tobacco  Allergies  Meds  Problems  Med Hx  Surg Hx  Fam Hx      Review of Systems: No other skin or systemic complaints.  Objective  Well appearing patient in no apparent distress; mood and affect are within normal limits.  All skin plus lower legs were examined.  Objective  Left Buccal Cheek , Left Lower Leg - Anterior, Mid Tip of Nose, Right Forehead, Right Lower Leg - Anterior (2): Erythematous patches with gritty scale.  Objective  Left upper buttock: Well-marginated erythematous papules/plaques with silvery scale. PsA flaring with d/c of Cosentyx  Objective  Left Lower Leg - Anterior, Right Lower Leg - Anterior, waist up: No atypical nevi.  Assessment & Plan  AK (actinic keratosis) (6) Left Lower Leg - Anterior; Right Lower Leg - Anterior (2); Mid Tip of Nose; Left Buccal Cheek ; Right Forehead  Destruction of lesion - Left Buccal Cheek , Left Lower Leg - Anterior, Mid Tip of Nose, Right Forehead, Right Lower Leg - Anterior (2) Complexity: simple   Destruction method: cryotherapy   Informed consent: discussed and consent obtained   Timeout:  patient name, date of birth, surgical site, and procedure verified Lesion destroyed using liquid nitrogen: Yes   Cryotherapy cycles:  1 Outcome: patient tolerated procedure well with no complications   Post-procedure details: wound care instructions given    Psoriasis Left upper buttock  Screening exam for skin cancer (3) Left Lower Leg -  Anterior; Right Lower Leg - Anterior; waist up

## 2020-01-21 NOTE — Addendum Note (Signed)
Addended by: Zenia Resides on: 01/21/2020 09:57 AM   Modules accepted: Orders

## 2020-01-21 NOTE — Patient Instructions (Addendum)
Wound Care Instructions  1. Cleanse would gently with soap and water once a day then pat dry with clean gauze. Apply a thing coat of Petrolatum (petroleum jelly, "Vaseline") over the wound (unless you have an allergy to this). We recommend that you use a new, sterile tube of Vaseline. Do not pick or remove scabs. Do not remove the yellow or white "healing tissue" from the base of the wound.  2. Cover the wound with fresh, clean, nonstick gauze and secure with paper tape. You may use Band-Aids in place of gauze and tape if the would is small enough, but would recommend trimming much of the tape off as there is often too much. Sometimes Band-Aids can irritate the skin.  3. You should call the office for your biopsy report after 1 week if you have not already been contacted.  4. If you experience any problems, such as abnormal amounts of bleeding, swelling, significant bruising, significant pain, or evidence of infection, please call the office immediately.  5. FOR ADULT SURGERY PATIENTS: If you need something for pain relief you may take 1 extra strength Tylenol (acetaminophen) AND 2 Ibuprofen (200mg  each) together every 4 hours as needed for pain. (do not take these if you are allergic to them or if you have a reason you should not take them.) Typically, you may only need pain medication for 1 to 3 days.    Call our office June 1st to make sure order was sent to lab for TB test

## 2020-01-24 ENCOUNTER — Telehealth: Payer: Self-pay | Admitting: Physician Assistant

## 2020-01-24 MED ORDER — LEVOCETIRIZINE DIHYDROCHLORIDE 5 MG PO TABS: 5.0000 mg | ORAL_TABLET | Freq: Every evening | ORAL | 3 refills | Status: DC

## 2020-01-24 NOTE — Telephone Encounter (Signed)
Phone call to patient to let her know that a prescription for Levocetirizine 5mg  [facility] [address].

## 2020-01-25 DIAGNOSIS — M1990 Unspecified osteoarthritis, unspecified site: Secondary | ICD-10-CM | POA: Diagnosis not present

## 2020-01-25 DIAGNOSIS — G894 Chronic pain syndrome: Secondary | ICD-10-CM | POA: Diagnosis not present

## 2020-01-25 DIAGNOSIS — M5136 Other intervertebral disc degeneration, lumbar region: Secondary | ICD-10-CM | POA: Diagnosis not present

## 2020-01-25 DIAGNOSIS — M179 Osteoarthritis of knee, unspecified: Secondary | ICD-10-CM | POA: Diagnosis not present

## 2020-02-22 DIAGNOSIS — M79609 Pain in unspecified limb: Secondary | ICD-10-CM | POA: Diagnosis not present

## 2020-02-22 DIAGNOSIS — M47816 Spondylosis without myelopathy or radiculopathy, lumbar region: Secondary | ICD-10-CM | POA: Diagnosis not present

## 2020-02-22 DIAGNOSIS — E11610 Type 2 diabetes mellitus with diabetic neuropathic arthropathy: Secondary | ICD-10-CM | POA: Diagnosis not present

## 2020-02-22 DIAGNOSIS — M9951 Intervertebral disc stenosis of neural canal of cervical region: Secondary | ICD-10-CM | POA: Diagnosis not present

## 2020-02-22 DIAGNOSIS — G894 Chronic pain syndrome: Secondary | ICD-10-CM | POA: Diagnosis not present

## 2020-02-22 DIAGNOSIS — M25519 Pain in unspecified shoulder: Secondary | ICD-10-CM | POA: Diagnosis not present

## 2020-02-22 DIAGNOSIS — M1990 Unspecified osteoarthritis, unspecified site: Secondary | ICD-10-CM | POA: Diagnosis not present

## 2020-02-22 DIAGNOSIS — M25559 Pain in unspecified hip: Secondary | ICD-10-CM | POA: Diagnosis not present

## 2020-02-22 DIAGNOSIS — M9931 Osseous stenosis of neural canal of cervical region: Secondary | ICD-10-CM | POA: Diagnosis not present

## 2020-02-22 DIAGNOSIS — M5136 Other intervertebral disc degeneration, lumbar region: Secondary | ICD-10-CM | POA: Diagnosis not present

## 2020-02-22 DIAGNOSIS — M179 Osteoarthritis of knee, unspecified: Secondary | ICD-10-CM | POA: Diagnosis not present

## 2020-02-23 ENCOUNTER — Telehealth: Payer: Self-pay | Admitting: Physician Assistant

## 2020-02-23 NOTE — Telephone Encounter (Signed)
Harrell Gave calling from [employer], [phone] Ext [phone]. Approval expired on 4/12. Pa request was closed due to no response. Reopen the request or initiate a new one, whichever is easier.

## 2020-02-28 ENCOUNTER — Other Ambulatory Visit: Payer: Self-pay

## 2020-02-28 ENCOUNTER — Ambulatory Visit (INDEPENDENT_AMBULATORY_CARE_PROVIDER_SITE_OTHER): Payer: Medicare Other | Admitting: Podiatry

## 2020-02-28 ENCOUNTER — Encounter: Payer: Self-pay | Admitting: Podiatry

## 2020-02-28 VITALS — Temp 97.9°F

## 2020-02-28 DIAGNOSIS — G5762 Lesion of plantar nerve, left lower limb: Secondary | ICD-10-CM

## 2020-02-28 DIAGNOSIS — G5761 Lesion of plantar nerve, right lower limb: Secondary | ICD-10-CM | POA: Diagnosis not present

## 2020-02-28 DIAGNOSIS — G5782 Other specified mononeuropathies of left lower limb: Secondary | ICD-10-CM

## 2020-02-28 DIAGNOSIS — G5781 Other specified mononeuropathies of right lower limb: Secondary | ICD-10-CM

## 2020-02-28 NOTE — Progress Notes (Signed)
She comes in today for follow-up of her neuromas.  States that she still having pain from the neuromas bilaterally and of course the neuropathy.  She is seeing Dr. Primus Bravo at the pain clinic in [place] and she is now taking Cymbalta and oxycodone regularly.  Objective: Vital signs are stable alert and oriented x3.  Pulses are palpable.  She has palpable Mulder's click to the third interdigital space bilaterally.  Assessment: Neuroma third interspace bilateral.  Plan: I injected the third interdigital space today with Kenalog bilaterally.  I will follow-up with her in about 2 months.

## 2020-02-29 NOTE — Telephone Encounter (Signed)
[facility] to [facility] to re-initiate patient's PA for Cosentyx.  All information given to the representative and PA # is [phone]. She said we should be receiving a fax for approval.

## 2020-03-14 ENCOUNTER — Ambulatory Visit: Payer: Medicare Other | Admitting: Gastroenterology

## 2020-03-29 ENCOUNTER — Ambulatory Visit: Payer: Medicare Other | Admitting: Podiatry

## 2020-03-29 DIAGNOSIS — Z79891 Long term (current) use of opiate analgesic: Secondary | ICD-10-CM | POA: Diagnosis not present

## 2020-03-29 DIAGNOSIS — M5136 Other intervertebral disc degeneration, lumbar region: Secondary | ICD-10-CM | POA: Diagnosis not present

## 2020-03-29 DIAGNOSIS — G894 Chronic pain syndrome: Secondary | ICD-10-CM | POA: Diagnosis not present

## 2020-03-29 DIAGNOSIS — M1990 Unspecified osteoarthritis, unspecified site: Secondary | ICD-10-CM | POA: Diagnosis not present

## 2020-04-10 ENCOUNTER — Ambulatory Visit (INDEPENDENT_AMBULATORY_CARE_PROVIDER_SITE_OTHER): Payer: Medicare Other | Admitting: Podiatry

## 2020-04-10 ENCOUNTER — Other Ambulatory Visit: Payer: Self-pay

## 2020-04-10 DIAGNOSIS — G5781 Other specified mononeuropathies of right lower limb: Secondary | ICD-10-CM

## 2020-04-10 DIAGNOSIS — G5782 Other specified mononeuropathies of left lower limb: Secondary | ICD-10-CM

## 2020-04-10 DIAGNOSIS — G5762 Lesion of plantar nerve, left lower limb: Secondary | ICD-10-CM

## 2020-04-10 DIAGNOSIS — G5761 Lesion of plantar nerve, right lower limb: Secondary | ICD-10-CM | POA: Diagnosis not present

## 2020-04-10 NOTE — Progress Notes (Signed)
She presents today for follow-up of her neuroma third interdigital space bilaterally.  States that the left one is still the most tender but the right one is acting up a little bit.  Objective: Vital signs are stable alert oriented x3 still has a palpable neuroma third interdigital space bilaterally it seems like the left is better than it was previously however the right one is quite tender as well.  Assessment: Neuroma third interspace bilateral left greater than right.  Plan: At this point I injected with dexamethasone since we reestablish care with her back in April after having not seen her since 2019 I did want to put another heavy steroid in there so I did just do a dexamethasone and may need to start back on dehydrated alcohol's if this fails.  I will follow-up with her in about 6 weeks.  I injected 2 mg of dexamethasone and local anesthetic through a 30-gauge half inch needle to the third interdigital space.

## 2020-04-14 ENCOUNTER — Other Ambulatory Visit: Payer: Self-pay | Admitting: Family Medicine

## 2020-05-17 DIAGNOSIS — M180 Bilateral primary osteoarthritis of first carpometacarpal joints: Secondary | ICD-10-CM | POA: Diagnosis not present

## 2020-05-17 DIAGNOSIS — G5603 Carpal tunnel syndrome, bilateral upper limbs: Secondary | ICD-10-CM | POA: Diagnosis not present

## 2020-05-17 DIAGNOSIS — M79641 Pain in right hand: Secondary | ICD-10-CM | POA: Diagnosis not present

## 2020-05-17 DIAGNOSIS — M79642 Pain in left hand: Secondary | ICD-10-CM | POA: Diagnosis not present

## 2020-05-22 ENCOUNTER — Ambulatory Visit: Payer: Medicare Other | Admitting: Podiatry

## 2020-05-24 DIAGNOSIS — G894 Chronic pain syndrome: Secondary | ICD-10-CM | POA: Diagnosis not present

## 2020-05-24 DIAGNOSIS — M5136 Other intervertebral disc degeneration, lumbar region: Secondary | ICD-10-CM | POA: Diagnosis not present

## 2020-05-24 DIAGNOSIS — E11610 Type 2 diabetes mellitus with diabetic neuropathic arthropathy: Secondary | ICD-10-CM | POA: Diagnosis not present

## 2020-05-24 DIAGNOSIS — M79609 Pain in unspecified limb: Secondary | ICD-10-CM | POA: Diagnosis not present

## 2020-05-24 DIAGNOSIS — M47816 Spondylosis without myelopathy or radiculopathy, lumbar region: Secondary | ICD-10-CM | POA: Diagnosis not present

## 2020-05-24 DIAGNOSIS — M25519 Pain in unspecified shoulder: Secondary | ICD-10-CM | POA: Diagnosis not present

## 2020-05-24 DIAGNOSIS — M25559 Pain in unspecified hip: Secondary | ICD-10-CM | POA: Diagnosis not present

## 2020-05-24 DIAGNOSIS — M9951 Intervertebral disc stenosis of neural canal of cervical region: Secondary | ICD-10-CM | POA: Diagnosis not present

## 2020-05-24 DIAGNOSIS — Z79891 Long term (current) use of opiate analgesic: Secondary | ICD-10-CM | POA: Diagnosis not present

## 2020-05-24 DIAGNOSIS — M1990 Unspecified osteoarthritis, unspecified site: Secondary | ICD-10-CM | POA: Diagnosis not present

## 2020-05-24 DIAGNOSIS — G8929 Other chronic pain: Secondary | ICD-10-CM | POA: Diagnosis not present

## 2020-05-24 DIAGNOSIS — M9931 Osseous stenosis of neural canal of cervical region: Secondary | ICD-10-CM | POA: Diagnosis not present

## 2020-06-18 DIAGNOSIS — G894 Chronic pain syndrome: Secondary | ICD-10-CM | POA: Diagnosis not present

## 2020-06-21 DIAGNOSIS — G894 Chronic pain syndrome: Secondary | ICD-10-CM | POA: Diagnosis not present

## 2020-07-19 DIAGNOSIS — G894 Chronic pain syndrome: Secondary | ICD-10-CM | POA: Diagnosis not present

## 2020-07-19 DIAGNOSIS — M5136 Other intervertebral disc degeneration, lumbar region: Secondary | ICD-10-CM | POA: Diagnosis not present

## 2020-07-19 DIAGNOSIS — M1990 Unspecified osteoarthritis, unspecified site: Secondary | ICD-10-CM | POA: Diagnosis not present

## 2020-07-19 DIAGNOSIS — Z79891 Long term (current) use of opiate analgesic: Secondary | ICD-10-CM | POA: Diagnosis not present

## 2020-08-16 DIAGNOSIS — M5136 Other intervertebral disc degeneration, lumbar region: Secondary | ICD-10-CM | POA: Diagnosis not present

## 2020-08-16 DIAGNOSIS — M1990 Unspecified osteoarthritis, unspecified site: Secondary | ICD-10-CM | POA: Diagnosis not present

## 2020-08-16 DIAGNOSIS — Z79891 Long term (current) use of opiate analgesic: Secondary | ICD-10-CM | POA: Diagnosis not present

## 2020-08-16 DIAGNOSIS — G894 Chronic pain syndrome: Secondary | ICD-10-CM | POA: Diagnosis not present

## 2020-08-17 ENCOUNTER — Telehealth: Payer: Self-pay | Admitting: Family Medicine

## 2020-08-17 DIAGNOSIS — G894 Chronic pain syndrome: Secondary | ICD-10-CM | POA: Diagnosis not present

## 2020-08-17 NOTE — Telephone Encounter (Signed)
Received a fax from [place]. Requesting a new rx for freestyle test strips

## 2020-08-18 ENCOUNTER — Other Ambulatory Visit: Payer: Self-pay | Admitting: Family Medicine

## 2020-08-18 MED ORDER — FREESTYLE LITE TEST VI STRP: ORAL_STRIP | 12 refills | Status: DC

## 2020-08-18 NOTE — Telephone Encounter (Signed)
[facility] [address]

## 2020-08-18 NOTE — Telephone Encounter (Signed)
Refill sent.

## 2020-08-18 NOTE — Telephone Encounter (Signed)
Duplicate phone note.

## 2020-09-11 ENCOUNTER — Encounter: Payer: Self-pay | Admitting: Gastroenterology

## 2020-09-11 ENCOUNTER — Ambulatory Visit (INDEPENDENT_AMBULATORY_CARE_PROVIDER_SITE_OTHER): Payer: Medicare Other | Admitting: Gastroenterology

## 2020-09-11 ENCOUNTER — Ambulatory Visit: Payer: Medicare Other | Admitting: Gastroenterology

## 2020-09-11 VITALS — BP 120/60 | HR 82 | Ht 62.0 in | Wt 182.0 lb

## 2020-09-11 DIAGNOSIS — R1310 Dysphagia, unspecified: Secondary | ICD-10-CM

## 2020-09-11 DIAGNOSIS — K589 Irritable bowel syndrome without diarrhea: Secondary | ICD-10-CM | POA: Diagnosis not present

## 2020-09-11 DIAGNOSIS — K5900 Constipation, unspecified: Secondary | ICD-10-CM

## 2020-09-11 DIAGNOSIS — R1011 Right upper quadrant pain: Secondary | ICD-10-CM

## 2020-09-11 DIAGNOSIS — R1013 Epigastric pain: Secondary | ICD-10-CM

## 2020-09-11 MED ORDER — OMEPRAZOLE 40 MG PO CPDR
40.0000 mg | DELAYED_RELEASE_CAPSULE | Freq: Every day | ORAL | 3 refills | Status: DC
Start: 2020-09-11 — End: 2020-10-17

## 2020-09-11 MED ORDER — LINACLOTIDE 145 MCG PO CAPS: 145.0000 ug | ORAL_CAPSULE | Freq: Every day | ORAL | 3 refills | Status: DC

## 2020-09-11 NOTE — Patient Instructions (Addendum)
If you are age 71 or older, your body mass index should be between 23-30. Your Body mass index is 33.29 kg/m. If this is out of the aforementioned range listed, please consider follow up with your Primary Care Provider.  If you are age 75 or younger, your body mass index should be between 19-25. Your Body mass index is 33.29 kg/m. If this is out of the aformentioned range listed, please consider follow up with your Primary Care Provider.   We have sent the following medications to your pharmacy for you to pick up at your convenience: Linzess and omeprazole   You have been scheduled for an endoscopy. Please follow written instructions given to you at your visit today. If you use inhalers (even only as needed), please bring them with you on the day of your procedure.  You have been scheduled for an abdominal ultrasound at [facility]  (1st floor of hospital) on 09/14/20 at 10:30am. Please arrive 15 minutes prior to your appointment for registration. Make certain not to have anything to eat or drink 6 hours prior to your appointment. Should you need to reschedule your appointment, please contact radiology at [phone]. This test typically takes about 30 minutes to perform.   Gastroesophageal Reflux Disease, Adult Gastroesophageal reflux (GER) happens when acid from the stomach flows up into the tube that connects the mouth and the stomach (esophagus). Normally, food travels down the esophagus and stays in the stomach to be digested. With GER, food and stomach acid sometimes move back up into the esophagus. You may have a disease called gastroesophageal reflux disease (GERD) if the reflux:  Happens often.  Causes frequent or very bad symptoms.  Causes problems such as damage to the esophagus. When this happens, the esophagus becomes sore and swollen (inflamed). Over time, GERD can make small holes (ulcers) in the lining of the esophagus. What are the causes? This condition is caused by a  problem with the muscle between the esophagus and the stomach. When this muscle is weak or not normal, it does not close properly to keep food and acid from coming back up from the stomach. The muscle can be weak because of:  Tobacco use.  Pregnancy.  Having a certain type of hernia (hiatal hernia).  Alcohol use.  Certain foods and drinks, such as coffee, chocolate, onions, and peppermint. What increases the risk? You are more likely to develop this condition if you:  Are overweight.  Have a disease that affects your connective tissue.  Use NSAID medicines. What are the signs or symptoms? Symptoms of this condition include:  Heartburn.  Difficult or painful swallowing.  The feeling of having a lump in the throat.  A bitter taste in the mouth.  Bad breath.  Having a lot of saliva.  Having an upset or bloated stomach.  Belching.  Chest pain. Different conditions can cause chest pain. Make sure you see your doctor if you have chest pain.  Shortness of breath or noisy breathing (wheezing).  Ongoing (chronic) cough or a cough at night.  Wearing away of the surface of teeth (tooth enamel).  Weight loss. How is this treated? Treatment will depend on how bad your symptoms are. Your doctor may suggest:  Changes to your diet.  Medicine.  Surgery. Follow these instructions at home: Eating and drinking   Follow a diet as told by your doctor. You may need to avoid foods and drinks such as: ? Coffee and tea (with or without caffeine). ? Drinks  that contain alcohol. ? Energy drinks and sports drinks. ? Bubbly (carbonated) drinks or sodas. ? Chocolate and cocoa. ? Peppermint and mint flavorings. ? Garlic and onions. ? Horseradish. ? Spicy and acidic foods. These include peppers, chili powder, curry powder, vinegar, hot sauces, and BBQ sauce. ? Citrus fruit juices and citrus fruits, such as oranges, lemons, and limes. ? Tomato-based foods. These include red sauce,  chili, salsa, and pizza with red sauce. ? Fried and fatty foods. These include donuts, french fries, potato chips, and high-fat dressings. ? High-fat meats. These include hot dogs, rib eye steak, sausage, ham, and bacon. ? High-fat dairy items, such as whole milk, butter, and cream cheese.  Eat small meals often. Avoid eating large meals.  Avoid drinking large amounts of liquid with your meals.  Avoid eating meals during the 2-3 hours before bedtime.  Avoid lying down right after you eat.  Do not exercise right after you eat. Lifestyle   Do not use any products that contain nicotine or tobacco. These include cigarettes, e-cigarettes, and chewing tobacco. If you need help quitting, ask your doctor.  Try to lower your stress. If you need help doing this, ask your doctor.  If you are overweight, lose an amount of weight that is healthy for you. Ask your doctor about a safe weight loss goal. General instructions  Pay attention to any changes in your symptoms.  Take over-the-counter and prescription medicines only as told by your doctor. Do not take aspirin, ibuprofen, or other NSAIDs unless your doctor says it is okay.  Wear loose clothes. Do not wear anything tight around your waist.  Raise (elevate) the head of your bed about 6 inches (15 cm).  Avoid bending over if this makes your symptoms worse.  Keep all follow-up visits as told by your doctor. This is important. Contact a doctor if:  You have new symptoms.  You lose weight and you do not know why.  You have trouble swallowing or it hurts to swallow.  You have wheezing or a cough that keeps happening.  Your symptoms do not get better with treatment.  You have a hoarse voice. Get help right away if:  You have pain in your arms, neck, jaw, teeth, or back.  You feel sweaty, dizzy, or light-headed.  You have chest pain or shortness of breath.  You throw up (vomit) and your throw-up looks like blood or coffee  grounds.  You pass out (faint).  Your poop (stool) is bloody or black.  You cannot swallow, drink, or eat. Summary  If a person has gastroesophageal reflux disease (GERD), food and stomach acid move back up into the esophagus and cause symptoms or problems such as damage to the esophagus.  Treatment will depend on how bad your symptoms are.  Follow a diet as told by your doctor.  Take all medicines only as told by your doctor. This information is not intended to replace advice given to you by your health care provider. Make sure you discuss any questions you have with your health care provider. Document Revised: 04/29/2018 Document Reviewed: 04/29/2018   Constipation, Adult Constipation is when a person:  Poops (has a bowel movement) fewer times in a week than normal.  Has a hard time pooping.  Has poop that is dry, hard, or bigger than normal. Follow these instructions at home: Eating and drinking   Eat foods that have a lot of fiber, such as: ? Fresh fruits and vegetables. ? Whole grains. ?  Beans.  Eat less of foods that are high in fat, low in fiber, or overly processed, such as: ? [place] fries. ? Hamburgers. ? Cookies. ? Candy. ? Soda.  Drink enough fluid to keep your pee (urine) clear or pale yellow. General instructions  Exercise regularly or as told by your doctor.  Go to the restroom when you feel like you need to poop. Do not hold it in.  Take over-the-counter and prescription medicines only as told by your doctor. These include any fiber supplements.  Do pelvic floor retraining exercises, such as: ? Doing deep breathing while relaxing your lower belly (abdomen). ? Relaxing your pelvic floor while pooping.  Watch your condition for any changes.  Keep all follow-up visits as told by your doctor. This is important. Contact a doctor if:  You have pain that gets worse.  You have a fever.  You have not pooped for 4 days.  You throw up  (vomit).  You are not hungry.  You lose weight.  You are bleeding from the anus.  You have thin, pencil-like poop (stool). Get help right away if:  You have a fever, and your symptoms suddenly get worse.  You leak poop or have blood in your poop.  Your belly feels hard or bigger than normal (is bloated).  You have very bad belly pain.  You feel dizzy or you faint. This information is not intended to replace advice given to you by your health care provider. Make sure you discuss any questions you have with your health care provider. Document Revised: 10/03/2017 Document Reviewed: 04/10/2016 Elsevier Patient Education  2020 [place] Patient Education  [employer].  Follow up in three months Thank you for choosing me and [facility].  Harl Bowie , M.D

## 2020-09-11 NOTE — Progress Notes (Signed)
Abigail [name]    [id number]    [birth year]  Primary Care Physician:[place], Modena Nunnery, MD  Referring Physician: Alycia Rossetti, MD [address] [place],  [place] [address]   Chief complaint:  Dysphagia, Abdominal pain, Constipation  HPI:  69 year old very pleasant female female with history of diabetes, hypertension, psoriasis on chronic immunosuppressive therapy Cosentyx, here with complaints of recurrent dysphagia and abdominal pain  She is starting to notice difficulty swallowing, feels food gets hung up in the lower chest and upper abdomen and epigastric region when she tries to eat fast or does not chew well.  Denies any odynophagia She is also having pain in the right upper, epigastric and left upper quadrant.  She has history of chronic constipation but her bowel habits have worsened and currently she is having a bowel movement only once a week on average.  When she gets severe constipation, also develops nausea and vomiting occasionally. No melena or blood per rectum.  EGD December 23, 2018 showed improvement of esophageal candidiasis, benign esophageal stricture s/p dilation with TTS balloon to 20 mm.  Gastropathy.  EGD November 17, 2018 with findings of Candida esophagitis and esophageal stricture, dilated to 15.5 mm with TTS balloon.  She was treated with oral fluconazole.  Her symptoms improved significantly for a few weeks but she feels they are coming back and is choking intermittently with solid food. Denies any vomiting, abdominal pain, change in bowel habits or rectal bleeding.  Colonoscopy August 2019 with removal of transverse colon adenomatous polyp, sigmoid diverticulosis and internal hemorrhoids   Outpatient Encounter Medications as of 09/11/2020  Medication Sig  . clobetasol (TEMOVATE) 0.05 % external solution Apply 1 application topically 2 (two) times daily.  . COSENTYX SENSOREADY, 300 MG, 150 MG/ML SOAJ Inject 2 pens into the skin every 30  (thirty) days.  . DULoxetine (CYMBALTA) 30 MG capsule take 1 capsule by mouth once daily (IF TOLERATED)  . gabapentin (NEURONTIN) 300 MG capsule TAKE 3 CAPSULES BY MOUTH THREE TIMES DAILY (Patient taking differently: 4 caps in the morning and 4 in the evening per patient)  . glucose blood (FREESTYLE LITE) test strip Use as instructed to monitor FSBS 1x daily. Dx: E11.9  . glucose monitoring kit (FREESTYLE) monitoring kit 1 each by Does not apply route as needed for other. Dispense one glucometer of choice, testing strips/lancets for once per day glucose checks.  QS for 1 month, 11 refills.  . Lancets (FREESTYLE) lancets Use as instructed  . lisinopril (ZESTRIL) 20 MG tablet Take 1 tablet (20 mg total) by mouth daily.  . metFORMIN (GLUCOPHAGE) 1000 MG tablet TAKE 1 TABLET BY MOUTH EVERY DAY WITH BREAKFAST  . omeprazole (PRILOSEC) 40 MG capsule Take 1 capsule (40 mg total) by mouth 2 (two) times daily. Take 40 mg twice a day for 3 months followed by once daily (Patient taking differently: Take 40 mg by mouth daily. )  . oxyCODONE (OXY IR/ROXICODONE) 5 MG immediate release tablet TK 1/2 TO 1 TABLET BY MOUTH 2 TO 3 TIMES PER DAY IF TOLERATED. NO NUCYNTA  . Secukinumab, 300 MG Dose, (COSENTYX, 300 MG DOSE,) 150 MG/ML SOSY Inject 300 mg into the skin as directed. On week 0, 1, 2, 3, and 4.  . [DISCONTINUED] levocetirizine (XYZAL) 5 MG tablet Take 1 tablet (5 mg total) by mouth every evening. (Patient not taking: Reported on 09/11/2020)   No facility-administered encounter medications on file as  of 09/11/2020.    Allergies as of 09/11/2020 - Review Complete 09/11/2020  Allergen Reaction Noted  . Cinnamon  01/20/2017  . Statins  04/02/2017    Past Medical History:  Diagnosis Date  . Allergy   . Anxiety   . Arthritis    hands, knees  . Cancer ([place])   . Cataract    surgery to remove -bilateral  . COPD (chronic obstructive pulmonary disease) ([place])   . Depression   . Diabetes mellitus ([place])    a.  A1c 6.6 in 05/2013 indicating new dx.  . Fatty liver   . Female stress incontinence   . GERD (gastroesophageal reflux disease)   . Heart murmur    no problems  . Hemorrhoids   . History of kidney stones    surgery to remove  . Hyperlipidemia   . Hyperplastic colon polyp 06/07/2008  . Hypertension   . Hypertriglyceridemia   . IBS (irritable bowel syndrome)   . Lung nodule    a. 3m left lung nodule by CT 05/2013.  .Marland KitchenNeuromuscular disorder (H[facility])    neuropathy feet  . Obesity   . Psoriasis   . Skin cancer     Past Surgical History:  Procedure Laterality Date  . CATARACT EXTRACTION W/PHACO Left 10/06/2014   Procedure: CATARACT EXTRACTION PHACO AND INTRAOCULAR LENS PLACEMENT LEFT EYE;  Surgeon: KTonny Branch MD;  Location: [facility];  Service: Ophthalmology;  Laterality: Left;  CDE 7.35  . CATARACT EXTRACTION W/PHACO Right 11/14/2014   Procedure: CATARACT EXTRACTION PHACO AND INTRAOCULAR LENS PLACEMENT RIGHT EYE;  Surgeon: KTonny Branch MD;  Location: [facility];  Service: Ophthalmology;  Laterality: Right;  CDE:6.39  . COLONOSCOPY  06/2008   hx polyps  . CYSTOSCOPY W/ RETROGRADES Right 01/21/2017   Procedure: CYSTOSCOPY WITH RETROGRADE PYELOGRAM;  Surgeon: AHollice Espy MD;  Location: [facility];  Service: Urology;  Laterality: Right;  . CYSTOSCOPY/URETEROSCOPY/HOLMIUM LASER/STENT PLACEMENT Left 01/21/2017   Procedure: CYSTOSCOPY/URETEROSCOPY/HOLMIUM LASER/STENT PLACEMENT;  Surgeon: AHollice Espy MD;  Location: [facility];  Service: Urology;  Laterality: Left;  . DORSAL COMPARTMENT RELEASE Left 06/15/2015   Procedure: LEFT WRIST DEQUERVAINS;  Surgeon: DNinetta Lights MD;  Location: [facility]  Service: Orthopedics;  Laterality: Left;  . HERNIA REPAIR    . MOUTH SURGERY     tooth ext  . NASAL SINUS SURGERY    . SHOULDER SURGERY     right  . TONSILLECTOMY    . UPPER GI ENDOSCOPY     normal per patient  . VENTRAL HERNIA REPAIR      Family History  Problem Relation Age of  Onset  . Breast cancer Mother   . Cancer Mother   . Heart disease Maternal Grandfather   . Colon polyps Maternal Aunt   . Kidney cancer Neg Hx   . Bladder Cancer Neg Hx   . Colon cancer Neg Hx   . Stomach cancer Neg Hx   . Rectal cancer Neg Hx   . Pancreatic cancer Neg Hx   . Esophageal cancer Neg Hx     Social History   Socioeconomic History  . Marital status: Married    Spouse name: Not on file  . Number of children: 2  . Years of education: Not on file  . Highest education level: Not on file  Occupational History  . Occupation: retired  Tobacco Use  . Smoking status: Never Smoker  . Smokeless tobacco: Never Used  Vaping Use  . Vaping Use: Never used  Substance and Sexual Activity  . Alcohol use: No  . Drug use: No  . Sexual activity: Yes    Birth control/protection: Post-menopausal  Other Topics Concern  . Not on file  Social History Narrative  . Not on file   Social Determinants of Health   Financial Resource Strain:   . Difficulty of Paying Living Expenses: Not on file  Food Insecurity:   . Worried About Charity fundraiser in the Last Year: Not on file  . Ran Out of Food in the Last Year: Not on file  Transportation Needs:   . Lack of Transportation (Medical): Not on file  . Lack of Transportation (Non-Medical): Not on file  Physical Activity:   . Days of Exercise per Week: Not on file  . Minutes of Exercise per Session: Not on file  Stress:   . Feeling of Stress : Not on file  Social Connections:   . Frequency of Communication with Friends and Family: Not on file  . Frequency of Social Gatherings with Friends and Family: Not on file  . Attends Religious Services: Not on file  . Active Member of Clubs or Organizations: Not on file  . Attends Archivist Meetings: Not on file  . Marital Status: Not on file  Intimate Partner Violence:   . Fear of Current or Ex-Partner: Not on file  . Emotionally Abused: Not on file  . Physically Abused: Not  on file  . Sexually Abused: Not on file      Review of systems: All other review of systems negative except as mentioned in the HPI.   Physical Exam: Vitals:   09/11/20 0842  BP: 120/60  Pulse: 82  SpO2: 94%   Body mass index is 33.29 kg/m. Gen:      No acute distress HEENT:  sclera anicteric Abd:      soft, RUQ-tender; no palpable masses, no distension Ext:    No edema Neuro: alert and oriented x 3 Psych: normal mood and affect  Data Reviewed:  Reviewed labs, radiology imaging, old records and pertinent past GI work up   Assessment and Plan/Recommendations:  69 year old very pleasant female with diabetes, hypertension, psoriasis, chronic pain syndrome on opiates with dysphagia, epigastric, right upper quadrant abdominal pain and IBS constipation  Dysphagia: History of esophageal stricture s/p dilation in 12/2018 Will plan to proceed with EGD to exclude recurrent peptic stricture or erosive esophagitis.  History of Candida esophagitis s/p treatment.  Will need to exclude recurrent esophageal candidiasis The risks and benefits as well as alternatives of endoscopic procedure(s) have been discussed and reviewed. All questions answered. The patient agrees to proceed.  GERD: Continue omeprazole 40 mg twice daily Antireflux measures  Epigastric and right upper quadrant abdominal pain: Obtain right upper quadrant abdominal ultrasound to exclude gallbladder disease or any significant abnormality in the liver  IBS constipation exacerbated by opiates: Start Linzess 145 mcg daily, will titrate the dose based on response Increase dietary fiber and fluid intake  History of adenomatous colon polyps: Due for surveillance colonoscopy August 2024   The patient was provided an opportunity to ask questions and all were answered. The patient agreed with the plan and demonstrated an understanding of the instructions.  Damaris Hippo , MD    CC: Buelah Manis Modena Nunnery, MD

## 2020-09-13 DIAGNOSIS — M5136 Other intervertebral disc degeneration, lumbar region: Secondary | ICD-10-CM | POA: Diagnosis not present

## 2020-09-13 DIAGNOSIS — Z79891 Long term (current) use of opiate analgesic: Secondary | ICD-10-CM | POA: Diagnosis not present

## 2020-09-13 DIAGNOSIS — G894 Chronic pain syndrome: Secondary | ICD-10-CM | POA: Diagnosis not present

## 2020-09-13 DIAGNOSIS — M1990 Unspecified osteoarthritis, unspecified site: Secondary | ICD-10-CM | POA: Diagnosis not present

## 2020-09-14 ENCOUNTER — Ambulatory Visit (HOSPITAL_COMMUNITY)
Admission: RE | Admit: 2020-09-14 | Discharge: 2020-09-14 | Disposition: A | Discharge status: Home / Self Care | Payer: Medicare Other | Source: Ambulatory Visit | Attending: Gastroenterology | Admitting: Gastroenterology

## 2020-09-14 ENCOUNTER — Other Ambulatory Visit: Payer: Self-pay

## 2020-09-14 DIAGNOSIS — K589 Irritable bowel syndrome without diarrhea: Secondary | ICD-10-CM | POA: Insufficient documentation

## 2020-09-14 DIAGNOSIS — R1011 Right upper quadrant pain: Secondary | ICD-10-CM | POA: Diagnosis not present

## 2020-09-14 DIAGNOSIS — R1310 Dysphagia, unspecified: Secondary | ICD-10-CM | POA: Insufficient documentation

## 2020-09-14 DIAGNOSIS — R1013 Epigastric pain: Secondary | ICD-10-CM | POA: Diagnosis not present

## 2020-09-14 DIAGNOSIS — K5900 Constipation, unspecified: Secondary | ICD-10-CM | POA: Insufficient documentation

## 2020-09-16 DIAGNOSIS — G894 Chronic pain syndrome: Secondary | ICD-10-CM | POA: Diagnosis not present

## 2020-09-20 ENCOUNTER — Other Ambulatory Visit: Payer: Self-pay | Admitting: Gastroenterology

## 2020-09-20 DIAGNOSIS — Z1159 Encounter for screening for other viral diseases: Secondary | ICD-10-CM | POA: Diagnosis not present

## 2020-09-20 LAB — SARS CORONAVIRUS 2 (TAT 6-24 HRS): SARS Coronavirus 2: NEGATIVE

## 2020-09-22 ENCOUNTER — Ambulatory Visit (AMBULATORY_SURGERY_CENTER): Payer: Medicare Other | Admitting: Gastroenterology

## 2020-09-22 ENCOUNTER — Other Ambulatory Visit: Payer: Self-pay

## 2020-09-22 ENCOUNTER — Encounter: Payer: Self-pay | Admitting: Gastroenterology

## 2020-09-22 VITALS — BP 138/69 | HR 71 | Temp 97.5°F | Resp 12 | Ht 62.0 in | Wt 182.0 lb

## 2020-09-22 DIAGNOSIS — K2289 Other specified disease of esophagus: Secondary | ICD-10-CM | POA: Diagnosis not present

## 2020-09-22 DIAGNOSIS — K208 Other esophagitis without bleeding: Secondary | ICD-10-CM | POA: Diagnosis not present

## 2020-09-22 DIAGNOSIS — B3781 Candidal esophagitis: Secondary | ICD-10-CM

## 2020-09-22 DIAGNOSIS — K222 Esophageal obstruction: Secondary | ICD-10-CM | POA: Diagnosis not present

## 2020-09-22 DIAGNOSIS — R1310 Dysphagia, unspecified: Secondary | ICD-10-CM | POA: Diagnosis not present

## 2020-09-22 DIAGNOSIS — K219 Gastro-esophageal reflux disease without esophagitis: Secondary | ICD-10-CM | POA: Diagnosis not present

## 2020-09-22 DIAGNOSIS — K200 Eosinophilic esophagitis: Secondary | ICD-10-CM

## 2020-09-22 MED ORDER — FLUCONAZOLE 100 MG PO TABS: 100.0000 mg | ORAL_TABLET | Freq: Every day | ORAL | 0 refills | Status: AC

## 2020-09-22 MED ORDER — SODIUM CHLORIDE 0.9 % IV SOLN: 500.0000 mL | Freq: Once | INTRAVENOUS | Status: DC

## 2020-09-22 NOTE — Patient Instructions (Signed)
Handout given for esophagitis.  Pick up new Rx for fluconazole, take for 7 days.  YOU HAD AN ENDOSCOPIC PROCEDURE TODAY AT [facility]:   Refer to the procedure report that was given to you for any specific questions about what was found during the examination.  If the procedure report does not answer your questions, please call your gastroenterologist to clarify.  If you requested that your care partner not be given the details of your procedure findings, then the procedure report has been included in a sealed envelope for you to review at your convenience later.  YOU SHOULD EXPECT: Some feelings of bloating in the abdomen. Passage of more gas than usual.  Walking can help get rid of the air that was put into your GI tract during the procedure and reduce the bloating. If you had a lower endoscopy (such as a colonoscopy or flexible sigmoidoscopy) you may notice spotting of blood in your stool or on the toilet paper. If you underwent a bowel prep for your procedure, you may not have a normal bowel movement for a few days.  Please Note:  You might notice some irritation and congestion in your nose or some drainage.  This is from the oxygen used during your procedure.  There is no need for concern and it should clear up in a day or so.  SYMPTOMS TO REPORT IMMEDIATELY:   Following upper endoscopy (EGD)  Vomiting of blood or coffee ground material  New chest pain or pain under the shoulder blades  Painful or persistently difficult swallowing  New shortness of breath  Fever of 100F or higher  Black, tarry-looking stools  For urgent or emergent issues, a gastroenterologist can be reached at any hour by calling [phone]. Do not use MyChart messaging for urgent concerns.    DIET:  We do recommend a small meal at first, but then you may proceed to your regular diet.  Drink plenty of fluids but you should avoid alcoholic beverages for 24 hours.  ACTIVITY:  You should plan to  take it easy for the rest of today and you should NOT DRIVE or use heavy machinery until tomorrow (because of the sedation medicines used during the test).    FOLLOW UP: Our staff will call the number listed on your records 48-72 hours following your procedure to check on you and address any questions or concerns that you may have regarding the information given to you following your procedure. If we do not reach you, we will leave a message.  We will attempt to reach you two times.  During this call, we will ask if you have developed any symptoms of COVID 19. If you develop any symptoms (ie: fever, flu-like symptoms, shortness of breath, cough etc.) before then, please call [phone].  If you test positive for Covid 19 in the 2 weeks post procedure, please call and report this information to [place].    If any biopsies were taken you will be contacted by phone or by letter within the next 1-3 weeks.  Please call us at [phone] if you have not heard about the biopsies in 3 weeks.    SIGNATURES/CONFIDENTIALITY: You and/or your care partner have signed paperwork which will be entered into your electronic medical record.  These signatures attest to the fact that that the information above on your After Visit Summary has been reviewed and is understood.  Full responsibility of the confidentiality of this discharge information lies with you and/or  your care-partner. 

## 2020-09-22 NOTE — Progress Notes (Signed)
A/ox3, pleased with MAC, report to RN 

## 2020-09-22 NOTE — Progress Notes (Signed)
VS by SM  Pt's states no medical or surgical changes since previsit or office visit.  

## 2020-09-22 NOTE — Op Note (Signed)
[facility] Patient Name: Abigail [name] Procedure Date: 09/22/2020 1:29 PM MRN: [id number] Endoscopist: Mauri Pole , MD Age: 69 Referring MD:  Date of Birth: 1950/11/11 Gender: Female Account #: [id number] Procedure:                Upper GI endoscopy Indications:              Dysphagia, Epigastric abdominal pain Medicines:                Monitored Anesthesia Care Procedure:                Pre-Anesthesia Assessment:                           - Prior to the procedure, a History and Physical                            was performed, and patient medications and                            allergies were reviewed. The patient's tolerance of                            previous anesthesia was also reviewed. The risks                            and benefits of the procedure and the sedation                            options and risks were discussed with the patient.                            All questions were answered, and informed consent                            was obtained. Prior Anticoagulants: The patient has                            taken no previous anticoagulant or antiplatelet                            agents. ASA Grade Assessment: III - A patient with                            severe systemic disease. After reviewing the risks                            and benefits, the patient was deemed in                            satisfactory condition to undergo the procedure.                           After obtaining informed consent, the endoscope was  passed under direct vision. Throughout the                            procedure, the patient's blood pressure, pulse, and                            oxygen saturations were monitored continuously. The                            Endoscope was introduced through the mouth, and                            advanced to the second part of duodenum. The upper                            GI endoscopy  was accomplished without difficulty.                            The patient tolerated the procedure well. Scope In: Scope Out: Findings:                 LA Grade B (one or more mucosal breaks greater than                            5 mm, not extending between the tops of two mucosal                            folds) esophagitis with no bleeding was found 25 to                            36 cm from the incisors. Biopsies were obtained                            from the proximal and distal esophagus with cold                            forceps for histology of suspected eosinophilic                            esophagitis.                           The Z-line was regular and was found 35 cm from the                            incisors.                           The gastroesophageal flap valve was visualized                            endoscopically and classified as Hill Grade III                            (  minimal fold, loose to endoscope, hiatal hernia                            likely).                           One benign-appearing, intrinsic mild                            (non-circumferential scarring) stenosis was found                            35 to 36 cm from the incisors. This stenosis                            measured 1.8 cm (inner diameter) x less than one cm                            (in length). The stenosis was traversed. A TTS                            dilator was passed through the scope. Dilation with                            an 18-19-20 mm balloon dilator was performed to 20                            mm. The dilation site was examined following                            endoscope reinsertion and showed no change.                           The stomach was normal.                           The examined duodenum was normal. Complications:            No immediate complications. Estimated Blood Loss:     Estimated blood loss was minimal. Impression:                - LA Grade B reflux and erosive esophagitis with no                            bleeding. Biopsied.                           - Z-line regular, 35 cm from the incisors.                           - Gastroesophageal flap valve classified as Hill                            Grade III (minimal fold, loose to endoscope, hiatal  hernia likely).                           - Benign-appearing esophageal stenosis. Dilated.                           - Normal stomach.                           - Normal examined duodenum. Recommendation:           - Patient has a contact number available for                            emergencies. The signs and symptoms of potential                            delayed complications were discussed with the                            patient. Return to normal activities tomorrow.                            Written discharge instructions were provided to the                            patient.                           - Resume previous diet.                           - Continue present medications.                           - Await pathology results.                           - Fluconazole 100mg  daily X 7 days                           - Follow up in GI office in 2-3 months, please call                            to schedule appointment. Mauri Pole, MD 09/22/2020 2:02:28 PM This report has been signed electronically.

## 2020-09-22 NOTE — Progress Notes (Signed)
Called to room to assist during endoscopic procedure.  Patient ID and intended procedure confirmed with present staff. Received instructions for my participation in the procedure from the performing physician.  

## 2020-09-25 ENCOUNTER — Telehealth: Payer: Self-pay

## 2020-09-25 NOTE — Telephone Encounter (Signed)
  Follow up Call-  Call back number 09/22/2020 12/23/2018 11/17/2018 06/05/2018  Post procedure Call Back phone  # [phone]  Permission to leave phone message Yes Yes Yes Yes  Some recent data might be hidden     Patient questions:  Do you have a fever, pain , or abdominal swelling? No. Pain Score  0 *  Have you tolerated food without any problems? Yes.    Have you been able to return to your normal activities? Yes.    Do you have any questions about your discharge instructions: Diet   No. Medications  No. Follow up visit  No.  Do you have questions or concerns about your Care? No.  Actions: * If pain score is 4 or above: No action needed, pain <4.  1. Have you developed a fever since your procedure? no  2.   Have you had an respiratory symptoms (SOB or cough) since your procedure? no  3.   Have you tested positive for COVID 19 since your procedure no  4.   Have you had any family members/close contacts diagnosed with the COVID 19 since your procedure?  no   If yes to any of these questions please route to Joylene John, RN and Joella Prince, RN

## 2020-10-11 DIAGNOSIS — M9931 Osseous stenosis of neural canal of cervical region: Secondary | ICD-10-CM | POA: Diagnosis not present

## 2020-10-11 DIAGNOSIS — G894 Chronic pain syndrome: Secondary | ICD-10-CM | POA: Diagnosis not present

## 2020-10-11 DIAGNOSIS — E0843 Diabetes mellitus due to underlying condition with diabetic autonomic (poly)neuropathy: Secondary | ICD-10-CM | POA: Diagnosis not present

## 2020-10-11 DIAGNOSIS — Z79891 Long term (current) use of opiate analgesic: Secondary | ICD-10-CM | POA: Diagnosis not present

## 2020-10-11 DIAGNOSIS — M79669 Pain in unspecified lower leg: Secondary | ICD-10-CM | POA: Diagnosis not present

## 2020-10-11 DIAGNOSIS — M5136 Other intervertebral disc degeneration, lumbar region: Secondary | ICD-10-CM | POA: Diagnosis not present

## 2020-10-11 DIAGNOSIS — M9973 Connective tissue and disc stenosis of intervertebral foramina of lumbar region: Secondary | ICD-10-CM | POA: Diagnosis not present

## 2020-10-11 DIAGNOSIS — M9951 Intervertebral disc stenosis of neural canal of cervical region: Secondary | ICD-10-CM | POA: Diagnosis not present

## 2020-10-11 DIAGNOSIS — M1990 Unspecified osteoarthritis, unspecified site: Secondary | ICD-10-CM | POA: Diagnosis not present

## 2020-10-11 DIAGNOSIS — M25559 Pain in unspecified hip: Secondary | ICD-10-CM | POA: Diagnosis not present

## 2020-10-11 DIAGNOSIS — M9943 Connective tissue stenosis of neural canal of lumbar region: Secondary | ICD-10-CM | POA: Diagnosis not present

## 2020-10-11 DIAGNOSIS — M25519 Pain in unspecified shoulder: Secondary | ICD-10-CM | POA: Diagnosis not present

## 2020-10-11 DIAGNOSIS — G8929 Other chronic pain: Secondary | ICD-10-CM | POA: Diagnosis not present

## 2020-10-11 DIAGNOSIS — M79609 Pain in unspecified limb: Secondary | ICD-10-CM | POA: Diagnosis not present

## 2020-10-16 DIAGNOSIS — G894 Chronic pain syndrome: Secondary | ICD-10-CM | POA: Diagnosis not present

## 2020-10-17 ENCOUNTER — Other Ambulatory Visit: Payer: Self-pay

## 2020-10-17 MED ORDER — FLUTICASONE PROPIONATE HFA 220 MCG/ACT IN AERO: INHALATION_SPRAY | RESPIRATORY_TRACT | 2 refills | Status: DC

## 2020-10-17 MED ORDER — OMEPRAZOLE 40 MG PO CPDR
DELAYED_RELEASE_CAPSULE | ORAL | 3 refills | Status: DC
Start: 2020-10-17 — End: 2020-12-27

## 2020-10-22 ENCOUNTER — Other Ambulatory Visit: Payer: Self-pay | Admitting: Family Medicine

## 2020-11-07 ENCOUNTER — Other Ambulatory Visit: Payer: Self-pay | Admitting: Physician Assistant

## 2020-11-08 DIAGNOSIS — Z79891 Long term (current) use of opiate analgesic: Secondary | ICD-10-CM | POA: Diagnosis not present

## 2020-11-08 DIAGNOSIS — G894 Chronic pain syndrome: Secondary | ICD-10-CM | POA: Diagnosis not present

## 2020-11-08 DIAGNOSIS — M5136 Other intervertebral disc degeneration, lumbar region: Secondary | ICD-10-CM | POA: Diagnosis not present

## 2020-11-08 DIAGNOSIS — M1990 Unspecified osteoarthritis, unspecified site: Secondary | ICD-10-CM | POA: Diagnosis not present

## 2020-11-15 DIAGNOSIS — G894 Chronic pain syndrome: Secondary | ICD-10-CM | POA: Diagnosis not present

## 2020-11-21 ENCOUNTER — Other Ambulatory Visit: Payer: Self-pay | Admitting: Family Medicine

## 2020-11-23 ENCOUNTER — Ambulatory Visit: Payer: Medicare Other | Admitting: Gastroenterology

## 2020-12-01 ENCOUNTER — Encounter: Payer: Self-pay | Admitting: Family Medicine

## 2020-12-01 ENCOUNTER — Other Ambulatory Visit: Payer: Self-pay

## 2020-12-01 ENCOUNTER — Ambulatory Visit (INDEPENDENT_AMBULATORY_CARE_PROVIDER_SITE_OTHER): Payer: Medicare Other | Admitting: Family Medicine

## 2020-12-01 VITALS — BP 138/60 | HR 94 | Temp 97.3°F | Ht 62.0 in | Wt 181.0 lb

## 2020-12-01 DIAGNOSIS — Z1231 Encounter for screening mammogram for malignant neoplasm of breast: Secondary | ICD-10-CM | POA: Diagnosis not present

## 2020-12-01 DIAGNOSIS — E782 Mixed hyperlipidemia: Secondary | ICD-10-CM

## 2020-12-01 DIAGNOSIS — I358 Other nonrheumatic aortic valve disorders: Secondary | ICD-10-CM | POA: Diagnosis not present

## 2020-12-01 DIAGNOSIS — J019 Acute sinusitis, unspecified: Secondary | ICD-10-CM

## 2020-12-01 DIAGNOSIS — E1149 Type 2 diabetes mellitus with other diabetic neurological complication: Secondary | ICD-10-CM

## 2020-12-01 DIAGNOSIS — J42 Unspecified chronic bronchitis: Secondary | ICD-10-CM

## 2020-12-01 DIAGNOSIS — I10 Essential (primary) hypertension: Secondary | ICD-10-CM | POA: Diagnosis not present

## 2020-12-01 DIAGNOSIS — E1142 Type 2 diabetes mellitus with diabetic polyneuropathy: Secondary | ICD-10-CM | POA: Diagnosis not present

## 2020-12-01 MED ORDER — CEFDINIR 300 MG PO CAPS: 300.0000 mg | ORAL_CAPSULE | Freq: Two times a day (BID) | ORAL | 0 refills | Status: DC

## 2020-12-01 MED ORDER — EMPAGLIFLOZIN 25 MG PO TABS: 25.0000 mg | ORAL_TABLET | Freq: Every day | ORAL | 0 refills | Status: DC

## 2020-12-01 MED ORDER — FREESTYLE LITE TEST VI STRP
ORAL_STRIP | 12 refills | Status: DC
Start: 2020-12-01 — End: 2022-02-15

## 2020-12-01 MED ORDER — FLUCONAZOLE 150 MG PO TABS: 150.0000 mg | ORAL_TABLET | Freq: Once | ORAL | 0 refills | Status: AC

## 2020-12-01 NOTE — Assessment & Plan Note (Signed)
Recheck labs and renal function Given samples of Jardiance 25mg  once a day to try if labs come back okay On statin drug

## 2020-12-01 NOTE — Assessment & Plan Note (Signed)
Controlled no changes 

## 2020-12-01 NOTE — Patient Instructions (Addendum)
Take antibiotics use nasal spray  Diflucan sent Schedule your mammogram  We will call with lab results

## 2020-12-01 NOTE — Assessment & Plan Note (Signed)
Asymptomatic. 

## 2020-12-01 NOTE — Progress Notes (Signed)
Subjective:    Patient ID: Abigail [name], female    DOB: November 28, 1950, 70 y.o.   MRN: [id number]  Patient presents for Diabetes, Hypertension, and mammogram (Patient would like to schedule appt for Mammogram. Reuest is be done at [facility].)   Pt here to f/u chronic medical problems   She is here to f/u medications. Last visit > 1   She had EGD in November has GERD and had throat stretched , she is on omeprazole 40mg  once a day   IBS Constipation- she is currently on Iizness, but has some diarrhea   Chronic pain- followed by Dr. Primus Bravo, she is using diclofenac patches and gabapentin and oxycodone  Topical patches  She has sinus pressure for weeks now, no fever Bloody discharge, sinus presure  Using nasal sprays some with little relief   DM- overdue for A1C, last done > 1 year ago, she has not checked them  she is taking metformin 1000mg  BID but with her GI issues wants to try another medication if possible    HTN- taking lisinopril without any diffulity   She is still on consentyn   COPD she is on floven, given by GI   Due for Mammogram needs done [facility]  Declines COVID/FLU    Review Of Systems:  GEN- denies fatigue, fever, weight loss,weakness, recent illness HEENT- denies eye drainage, change in vision, nasal discharge, CVS- denies chest pain, palpitations RESP- denies SOB, cough, wheeze ABD- denies N/V, +change in stools, abd pain GU- denies dysuria, hematuria, dribbling, incontinence MSK- denies joint pain, muscle aches, injury Neuro- denies headache, dizziness, syncope, seizure activity       Objective:    BP 138/60   Pulse 94   Temp (!) 97.3 F (36.3 C)   Ht 5\' 2"  (1.575 m)   Wt 181 lb (82.1 kg)   SpO2 95%   BMI 33.11 kg/m  GEN- NAD, alert and oriented x3 HEENT- PERRL, EOMI, non injected sclera, pink conjunctiva, MMM, oropharynx clear , +sinus pressure, +enlarged turbinates, mucouis nares, TM clear no effusion  Neck- Supple, no thyromegaly CVS- RRR,  3/6 murmur RESP-CTAB ABD-NABS,soft,NT,ND EXT- No edema Pulses- Radial, DP- 2+        Assessment & Plan:      Problem List Items Addressed This Visit      Unprioritized   Aortic valve sclerosis    Asymptomatic       COPD (chronic obstructive pulmonary disease) ([place])    On flovent, given by GI       Diabetic neuropathy ([place])   Essential hypertension - Primary    Controlled no changes       Relevant Orders   CBC with Differential/Platelet   Comprehensive metabolic panel   Hyperlipidemia   Relevant Orders   Lipid panel   Type 2 diabetes mellitus with neurological complications ([place])    Recheck labs and renal function Given samples of Jardiance 25mg  once a day to try if labs come back okay On statin drug       Relevant Orders   Hemoglobin A1c   Lipid panel   HM DIABETES FOOT EXAM (Completed)   Microalbumin / creatinine urine ratio    Other Visit Diagnoses    Acute rhinosinusitis       Relevant Medications   cefdinir (OMNICEF) 300 MG capsule   fluconazole (DIFLUCAN) 150 MG tablet   Encounter for screening mammogram for malignant neoplasm of breast       Relevant Orders   MM  3D SCREEN BREAST BILATERAL      Note: This dictation was prepared with Dragon dictation along with smaller phrase technology. Any transcriptional errors that result from this process are unintentional.

## 2020-12-01 NOTE — Assessment & Plan Note (Signed)
On flovent, given by GI

## 2020-12-02 LAB — HEMOGLOBIN A1C
Hgb A1c MFr Bld: 7 % of total Hgb — ABNORMAL HIGH (ref <5.7)
Mean Plasma Glucose: 154 mg/dL
eAG (mmol/L): 8.5 mmol/L

## 2020-12-02 LAB — MICROALBUMIN / CREATININE URINE RATIO
Creatinine, Urine: 152 mg/dL (ref 20–275)
Microalb Creat Ratio: 4 mcg/mg creat (ref <30)
Microalb, Ur: 0.6 mg/dL

## 2020-12-02 LAB — CBC WITH DIFFERENTIAL/PLATELET
Absolute Monocytes: 490 cells/uL (ref 200–950)
Basophils Absolute: 7 cells/uL (ref 0–200)
Basophils Relative: 0.1 %
Eosinophils Absolute: 131 cells/uL (ref 15–500)
Eosinophils Relative: 1.9 %
HCT: 41.7 % (ref 35.0–45.0)
Hemoglobin: 14.2 g/dL (ref 11.7–15.5)
Lymphs Abs: 1697 cells/uL (ref 850–3900)
MCH: 29 pg (ref 27.0–33.0)
MCHC: 34.1 g/dL (ref 32.0–36.0)
MCV: 85.1 fL (ref 80.0–100.0)
MPV: 11.3 fL (ref 7.5–12.5)
Monocytes Relative: 7.1 %
Neutro Abs: 4575 cells/uL (ref 1500–7800)
Neutrophils Relative %: 66.3 %
Platelets: 229 10*3/uL (ref 140–400)
RBC: 4.9 10*6/uL (ref 3.80–5.10)
RDW: 12.8 % (ref 11.0–15.0)
Total Lymphocyte: 24.6 %
WBC: 6.9 10*3/uL (ref 3.8–10.8)

## 2020-12-02 LAB — COMPREHENSIVE METABOLIC PANEL
AG Ratio: 1.6 (calc) (ref 1.0–2.5)
ALT: 15 U/L (ref 6–29)
AST: 18 U/L (ref 10–35)
Albumin: 3.9 g/dL (ref 3.6–5.1)
Alkaline phosphatase (APISO): 76 U/L (ref 37–153)
BUN: 9 mg/dL (ref 7–25)
CO2: 31 mmol/L (ref 20–32)
Calcium: 9.1 mg/dL (ref 8.6–10.4)
Chloride: 103 mmol/L (ref 98–110)
Creat: 0.72 mg/dL (ref 0.60–0.93)
Globulin: 2.4 g/dL (calc) (ref 1.9–3.7)
Glucose, Bld: 91 mg/dL (ref 65–99)
Potassium: 4 mmol/L (ref 3.5–5.3)
Sodium: 141 mmol/L (ref 135–146)
Total Bilirubin: 0.5 mg/dL (ref 0.2–1.2)
Total Protein: 6.3 g/dL (ref 6.1–8.1)

## 2020-12-02 LAB — LIPID PANEL
Cholesterol: 207 mg/dL — ABNORMAL HIGH (ref <200)
HDL: 39 mg/dL — ABNORMAL LOW (ref >=50)
LDL Cholesterol (Calc): 137 mg/dL (calc) — ABNORMAL HIGH
Non-HDL Cholesterol (Calc): 168 mg/dL (calc) — ABNORMAL HIGH (ref <130)
Total CHOL/HDL Ratio: 5.3 (calc) — ABNORMAL HIGH (ref <5.0)
Triglycerides: 177 mg/dL — ABNORMAL HIGH (ref <150)

## 2020-12-05 DIAGNOSIS — G894 Chronic pain syndrome: Secondary | ICD-10-CM | POA: Diagnosis not present

## 2020-12-06 DIAGNOSIS — G894 Chronic pain syndrome: Secondary | ICD-10-CM | POA: Diagnosis not present

## 2020-12-07 ENCOUNTER — Other Ambulatory Visit: Payer: Self-pay | Admitting: *Deleted

## 2020-12-07 MED ORDER — EMPAGLIFLOZIN 25 MG PO TABS: 25.0000 mg | ORAL_TABLET | Freq: Every day | ORAL | 3 refills | Status: DC

## 2020-12-14 ENCOUNTER — Ambulatory Visit (HOSPITAL_COMMUNITY): Payer: Medicare Other

## 2020-12-15 DIAGNOSIS — G894 Chronic pain syndrome: Secondary | ICD-10-CM | POA: Diagnosis not present

## 2020-12-18 ENCOUNTER — Ambulatory Visit (HOSPITAL_COMMUNITY)
Admission: RE | Admit: 2020-12-18 | Discharge: 2020-12-18 | Disposition: A | Discharge status: Home / Self Care | Payer: Medicare Other | Source: Ambulatory Visit | Attending: Family Medicine | Admitting: Family Medicine

## 2020-12-18 ENCOUNTER — Other Ambulatory Visit: Payer: Self-pay

## 2020-12-18 DIAGNOSIS — Z1231 Encounter for screening mammogram for malignant neoplasm of breast: Secondary | ICD-10-CM | POA: Insufficient documentation

## 2020-12-26 ENCOUNTER — Telehealth: Payer: Self-pay | Admitting: Family Medicine

## 2020-12-26 NOTE — Telephone Encounter (Signed)
Call for lab results.

## 2020-12-27 ENCOUNTER — Encounter: Payer: Self-pay | Admitting: Gastroenterology

## 2020-12-27 ENCOUNTER — Ambulatory Visit (INDEPENDENT_AMBULATORY_CARE_PROVIDER_SITE_OTHER): Payer: Medicare Other | Admitting: Gastroenterology

## 2020-12-27 VITALS — BP 130/64 | HR 70 | Ht 62.0 in | Wt 180.0 lb

## 2020-12-27 DIAGNOSIS — K200 Eosinophilic esophagitis: Secondary | ICD-10-CM | POA: Diagnosis not present

## 2020-12-27 DIAGNOSIS — K219 Gastro-esophageal reflux disease without esophagitis: Secondary | ICD-10-CM

## 2020-12-27 DIAGNOSIS — R1310 Dysphagia, unspecified: Secondary | ICD-10-CM

## 2020-12-27 MED ORDER — OMEPRAZOLE 40 MG PO CPDR: 40.0000 mg | DELAYED_RELEASE_CAPSULE | Freq: Two times a day (BID) | ORAL | 6 refills | Status: DC

## 2020-12-27 NOTE — Progress Notes (Signed)
Abigail [name]    [id number]    1951/06/26  Primary Care Physician:[place], Modena Nunnery, MD  Referring Physician: Alycia Rossetti, MD [address] [place],  [place] [address]   Chief complaint:  Eosinophilic esophagitis  HPI: 70 year old very pleasant female here for follow-up visit for eosinophilic esophagitis.  She feels her swallowing has significantly improved after last EGD with dilation.  She is not having any choking or no  food impaction.  No melena or rectal bleeding.  EGD 09/22/20 - LA Grade B (one or more mucosal breaks greater than 5 mm, not extending between the tops of two mucosal folds) esophagitis with no bleeding was found 25 to 36 cm from the incisors. Biopsies were obtained from the proximal and distal esophagus with cold forceps for histology of suspected eosinophilic esophagitis. - The Z-line was regular and was found 35 cm from the incisors. - The gastroesophageal flap valve was visualized endoscopically and classified as Hill Grade III (minimal fold, loose to endoscope, hiatal hernia likely). - One benign-appearing, intrinsic mild (non-circumferential scarring) stenosis was found 35 to 36 cm from the incisors. This stenosis measured 1.8 cm (inner diameter) x less than one cm (in length). The stenosis was traversed. A TTS dilator was passed through the scope. Dilation with an 18-19-20 mm balloon dilator was performed to 20 mm. The dilation site was examined following endoscope reinsertion and showed no change. - The stomach was normal. - The examined duodenum was normal.  1. Surgical [P], esophagogastric junction and distal esophagus - GASTROESOPHAGEAL MUCOSA WITH INFLAMMATION CONSISTENT WITH REFLUX. - NO INTESTINAL METAPLASIA, DYSPLASIA OR CARCINOMA. - NO FEATURES OF EOSINOPHILIC ESOPHAGITIS. 2. Surgical [P], proximal esophagus - SQUAMOUS MUCOSA WITH INCREASED EOSINOPHILS CONSISTENT WITH EOSINOPHILIC ESOPHAGITIS. - GREATER THAN 20 PER HIGH  POWER FIELD.  Outpatient Encounter Medications as of 12/27/2020  Medication Sig  . cefdinir (OMNICEF) 300 MG capsule Take 1 capsule (300 mg total) by mouth 2 (two) times daily.  . clobetasol (TEMOVATE) 0.05 % external solution Apply 1 application topically 2 (two) times daily.  . COSENTYX SENSOREADY, 300 MG, 150 MG/ML SOAJ INJECT TWO PENS SUBCUTANEOUSLY EVERY 4 WEEKS. REFRIGERATE. ALLOW 15 TO 30 MINUTES AT ROOM TEMP PRIOR TO ADMINISTRATION.  . diclofenac (FLECTOR) 1.3 % PTCH SMARTSIG:Topical  . DULoxetine (CYMBALTA) 30 MG capsule take 1 capsule by mouth once daily (IF TOLERATED)  . empagliflozin (JARDIANCE) 25 MG TABS tablet Take 1 tablet (25 mg total) by mouth daily before breakfast.  . fluticasone (FLOVENT HFA) 220 MCG/ACT inhaler 2 puffs swallowed twice daily-do not eat or drink for 30 minutes after  . gabapentin (NEURONTIN) 300 MG capsule TAKE 3 CAPSULES BY MOUTH THREE TIMES DAILY (Patient taking differently: 4 caps in the morning and 4 in the evening per patient)  . glucose blood (FREESTYLE LITE) test strip Use as instructed to monitor FSBS 1x daily. Dx: E11.9  . glucose monitoring kit (FREESTYLE) monitoring kit 1 each by Does not apply route as needed for other. Dispense one glucometer of choice, testing strips/lancets for once per day glucose checks.  QS for 1 month, 11 refills.  . Lancets (FREESTYLE) lancets Use as instructed  . linaclotide (LINZESS) 145 MCG CAPS capsule Take 1 capsule (145 mcg total) by mouth daily before breakfast.  . lisinopril (ZESTRIL) 20 MG tablet Take 1 tablet (20 mg total) by mouth daily.  Marland Kitchen omeprazole (PRILOSEC) 40 MG capsule Take 40 mg twice a day for 3  months followed by once daily  . oxyCODONE (OXY IR/ROXICODONE) 5 MG immediate release tablet TK 1/2 TO 1 TABLET BY MOUTH 2 TO 3 TIMES PER DAY IF TOLERATED. NO NUCYNTA  . Secukinumab, 300 MG Dose, (COSENTYX, 300 MG DOSE,) 150 MG/ML SOSY Inject 300 mg into the skin as directed. On week 0, 1, 2, 3, and 4.   No  facility-administered encounter medications on file as of 12/27/2020.    Allergies as of 12/27/2020 - Review Complete 12/01/2020  Allergen Reaction Noted  . Cinnamon  01/20/2017  . Statins  04/02/2017    Past Medical History:  Diagnosis Date  . Allergy   . Anxiety   . Arthritis    hands, knees  . Cancer ([place])   . Cataract    surgery to remove -bilateral  . COPD (chronic obstructive pulmonary disease) ([place])   . Depression   . Diabetes mellitus ([place])    a. A1c 6.6 in 05/2013 indicating new dx.  . Fatty liver   . Female stress incontinence   . GERD (gastroesophageal reflux disease)   . Heart murmur    no problems  . Hemorrhoids   . History of kidney stones    surgery to remove  . Hyperlipidemia   . Hyperplastic colon polyp 06/07/2008  . Hypertension   . Hypertriglyceridemia   . IBS (irritable bowel syndrome)   . Lung nodule    a. 53m left lung nodule by CT 05/2013.  .Marland KitchenNeuromuscular disorder ([facility])    neuropathy feet  . Obesity   . Psoriasis   . Skin cancer     Past Surgical History:  Procedure Laterality Date  . CATARACT EXTRACTION W/PHACO Left 10/06/2014   Procedure: CATARACT EXTRACTION PHACO AND INTRAOCULAR LENS PLACEMENT LEFT EYE;  Surgeon: KTonny Branch MD;  Location: [facility];  Service: Ophthalmology;  Laterality: Left;  CDE 7.35  . CATARACT EXTRACTION W/PHACO Right 11/14/2014   Procedure: CATARACT EXTRACTION PHACO AND INTRAOCULAR LENS PLACEMENT RIGHT EYE;  Surgeon: KTonny Branch MD;  Location: [facility];  Service: Ophthalmology;  Laterality: Right;  CDE:6.39  . COLONOSCOPY  06/2008   hx polyps  . CYSTOSCOPY W/ RETROGRADES Right 01/21/2017   Procedure: CYSTOSCOPY WITH RETROGRADE PYELOGRAM;  Surgeon: AHollice Espy MD;  Location: [facility];  Service: Urology;  Laterality: Right;  . CYSTOSCOPY/URETEROSCOPY/HOLMIUM LASER/STENT PLACEMENT Left 01/21/2017   Procedure: CYSTOSCOPY/URETEROSCOPY/HOLMIUM LASER/STENT PLACEMENT;  Surgeon: AHollice Espy MD;  Location: [facility];  Service:  Urology;  Laterality: Left;  . DORSAL COMPARTMENT RELEASE Left 06/15/2015   Procedure: LEFT WRIST DEQUERVAINS;  Surgeon: DNinetta Lights MD;  Location: [facility]  Service: Orthopedics;  Laterality: Left;  . HERNIA REPAIR    . MOUTH SURGERY     tooth ext  . NASAL SINUS SURGERY    . SHOULDER SURGERY     right  . TONSILLECTOMY    . UPPER GI ENDOSCOPY     normal per patient  . VENTRAL HERNIA REPAIR      Family History  Problem Relation Age of Onset  . Breast cancer Mother   . Cancer Mother   . Heart disease Maternal Grandfather   . Colon polyps Maternal Aunt   . Kidney cancer Neg Hx   . Bladder Cancer Neg Hx   . Colon cancer Neg Hx   . Stomach cancer Neg Hx   . Rectal cancer Neg Hx   . Pancreatic cancer Neg Hx   . Esophageal cancer Neg Hx     Social History  Socioeconomic History  . Marital status: Married    Spouse name: Not on file  . Number of children: 2  . Years of education: Not on file  . Highest education level: Not on file  Occupational History  . Occupation: retired  Tobacco Use  . Smoking status: Never Smoker  . Smokeless tobacco: Never Used  Vaping Use  . Vaping Use: Never used  Substance and Sexual Activity  . Alcohol use: No  . Drug use: No  . Sexual activity: Yes    Birth control/protection: Post-menopausal  Other Topics Concern  . Not on file  Social History Narrative  . Not on file   Social Determinants of Health   Financial Resource Strain: Not on file  Food Insecurity: Not on file  Transportation Needs: Not on file  Physical Activity: Not on file  Stress: Not on file  Social Connections: Not on file  Intimate Partner Violence: Not on file      Review of systems: All other review of systems negative except as mentioned in the HPI.   Physical Exam: Vitals:   12/27/20 0941  BP: 130/64  Pulse: 70   Body mass index is 32.92 kg/m. Gen:      No acute distress HEENT:  sclera anicteric Abd:      soft,  non-tender; no palpable masses, no distension Ext:    No edema Neuro: alert and oriented x 3 Psych: normal mood and affect  Data Reviewed:  Reviewed labs, radiology imaging, old records and pertinent past GI work up   Assessment and Plan/Recommendations:  70 year old very pleasant female with history of eosinophilic esophagitis likely secondary to uncontrolled gastroesophageal acid reflux  Continue omeprazole twice daily and antireflux measures We will plan for repeat EGD with esophageal biopsies to document resolution of eosinophilic esophagitis and esophageal dilation if needed  The risks and benefits as well as alternatives of endoscopic procedure(s) have been discussed and reviewed. All questions answered. The patient agrees to proceed.    The patient was provided an opportunity to ask questions and all were answered. The patient agreed with the plan and demonstrated an understanding of the instructions.  Damaris Hippo , MD    CC: Buelah Manis Modena Nunnery, MD

## 2020-12-27 NOTE — Patient Instructions (Signed)
You have been scheduled for an endoscopy. Please follow written instructions given to you at your visit today. If you use inhalers (even only as needed), please bring them with you on the day of your procedure.   Due to recent changes in healthcare laws, you may see the results of your imaging and laboratory studies on MyChart before your provider has had a chance to review them.  We understand that in some cases there may be results that are confusing or concerning to you. Not all laboratory results come back in the same time frame and the provider may be waiting for multiple results in order to interpret others.  Please give [place] 48 hours in order for your provider to thoroughly review all the results before contacting the office for clarification of your results.   If you are age 72 or older, your body mass index should be between 23-30. Your Body mass index is 32.92 kg/m. If this is out of the aforementioned range listed, please consider follow up with your Primary Care Provider.  We have sent refills of Omeprazole to your pharmacy   [employer]'s Current Therapy 2021 (pp. 213-216). [place], [place]: Elsevier.">  Gastroesophageal Reflux Disease, Adult Gastroesophageal reflux (GER) happens when acid from the stomach flows up into the tube that connects the mouth and the stomach (esophagus). Normally, food travels down the esophagus and stays in the stomach to be digested. However, when a person has GER, food and stomach acid sometimes move back up into the esophagus. If this becomes a more serious problem, the person may be diagnosed with a disease called gastroesophageal reflux disease (GERD). GERD occurs when the reflux:  Happens often.  Causes frequent or severe symptoms.  Causes problems such as damage to the esophagus. When stomach acid comes in contact with the esophagus, the acid may cause inflammation in the esophagus. Over time, GERD may create small holes (ulcers) in the lining of the  esophagus. What are the causes? This condition is caused by a problem with the muscle between the esophagus and the stomach (lower esophageal sphincter, or LES). Normally, the LES muscle closes after food passes through the esophagus to the stomach. When the LES is weakened or abnormal, it does not close properly, and that allows food and stomach acid to go back up into the esophagus. The LES can be weakened by certain dietary substances, medicines, and medical conditions, including:  Tobacco use.  Pregnancy.  Having a hiatal hernia.  Alcohol use.  Certain foods and beverages, such as coffee, chocolate, onions, and peppermint. What increases the risk? You are more likely to develop this condition if you:  Have an increased body weight.  Have a connective tissue disorder.  Take NSAIDs, such as ibuprofen. What are the signs or symptoms? Symptoms of this condition include:  Heartburn.  Difficult or painful swallowing and the feeling of having a lump in the throat.  A bitter taste in the mouth.  Bad breath and having a large amount of saliva.  Having an upset or bloated stomach and belching.  Chest pain. Different conditions can cause chest pain. Make sure you see your health care provider if you experience chest pain.  Shortness of breath or wheezing.  Ongoing (chronic) cough or a nighttime cough.  Wearing away of tooth enamel.  Weight loss. How is this diagnosed? This condition may be diagnosed based on a medical history and a physical exam. To determine if you have mild or severe GERD, your health care provider  may also monitor how you respond to treatment. You may also have tests, including:  A test to examine your stomach and esophagus with a small camera (endoscopy).  A test that measures the acidity level in your esophagus.  A test that measures how much pressure is on your esophagus.  A barium swallow or modified barium swallow test to show the shape, size,  and functioning of your esophagus. How is this treated? Treatment for this condition may vary depending on how severe your symptoms are. Your health care provider may recommend:  Changes to your diet.  Medicine.  Surgery. The goal of treatment is to help relieve your symptoms and to prevent complications. Follow these instructions at home: Eating and drinking  Follow a diet as recommended by your health care provider. This may involve avoiding foods and drinks such as: ? Coffee and tea, with or without caffeine. ? Drinks that contain alcohol. ? Energy drinks and sports drinks. ? Carbonated drinks or sodas. ? Chocolate and cocoa. ? Peppermint and mint flavorings. ? Garlic and onions. ? Horseradish. ? Spicy and acidic foods, including peppers, chili powder, curry powder, vinegar, hot sauces, and barbecue sauce. ? Citrus fruit juices and citrus fruits, such as oranges, lemons, and limes. ? Tomato-based foods, such as red sauce, chili, salsa, and pizza with red sauce. ? Fried and fatty foods, such as donuts, french fries, potato chips, and high-fat dressings. ? High-fat meats, such as hot dogs and fatty cuts of red and white meats, such as rib eye steak, sausage, ham, and bacon. ? High-fat dairy items, such as whole milk, butter, and cream cheese.  Eat small, frequent meals instead of large meals.  Avoid drinking large amounts of liquid with your meals.  Avoid eating meals during the 2-3 hours before bedtime.  Avoid lying down right after you eat.  Do not exercise right after you eat.   Lifestyle  Do not use any products that contain nicotine or tobacco. These products include cigarettes, chewing tobacco, and vaping devices, such as e-cigarettes. If you need help quitting, ask your health care provider.  Try to reduce your stress by using methods such as yoga or meditation. If you need help reducing stress, ask your health care provider.  If you are overweight, reduce your  weight to an amount that is healthy for you. Ask your health care provider for guidance about a safe weight loss goal.   General instructions  Pay attention to any changes in your symptoms.  Take over-the-counter and prescription medicines only as told by your health care provider. Do not take aspirin, ibuprofen, or other NSAIDs unless your health care provider told you to take these medicines.  Wear loose-fitting clothing. Do not wear anything tight around your waist that causes pressure on your abdomen.  Raise (elevate) the head of your bed about 6 inches (15 cm). You can use a wedge to do this.  Avoid bending over if this makes your symptoms worse.  Keep all follow-up visits. This is important. Contact a health care provider if:  You have: ? New symptoms. ? Unexplained weight loss. ? Difficulty swallowing or it hurts to swallow. ? Wheezing or a persistent cough. ? A hoarse voice.  Your symptoms do not improve with treatment. Get help right away if:  You have sudden pain in your arms, neck, jaw, teeth, or back.  You suddenly feel sweaty, dizzy, or light-headed.  You have chest pain or shortness of breath.  You vomit and the  vomit is green, yellow, or black, or it looks like blood or coffee grounds.  You faint.  You have stool that is red, bloody, or black.  You cannot swallow, drink, or eat. These symptoms may represent a serious problem that is an emergency. Do not wait to see if the symptoms will go away. Get medical help right away. Call your local emergency services (911 in the [place] Do not drive yourself to the hospital. Summary  Gastroesophageal reflux happens when acid from the stomach flows up into the esophagus. GERD is a disease in which the reflux happens often, causes frequent or severe symptoms, or causes problems such as damage to the esophagus.  Treatment for this condition may vary depending on how severe your symptoms are. Your health care provider may  recommend diet and lifestyle changes, medicine, or surgery.  Contact a health care provider if you have new or worsening symptoms.  Take over-the-counter and prescription medicines only as told by your health care provider. Do not take aspirin, ibuprofen, or other NSAIDs unless your health care provider told you to do so.  Keep all follow-up visits as told by your health care provider. This is important. This information is not intended to replace advice given to you by your health care provider. Make sure you discuss any questions you have with your health care provider. Document Revised: 05/01/2020 Document Reviewed: 05/01/2020 Elsevier Patient Education  [place].  I appreciate the  opportunity to care for you  Thank You   Harl Bowie , MD

## 2021-01-03 DIAGNOSIS — G894 Chronic pain syndrome: Secondary | ICD-10-CM | POA: Diagnosis not present

## 2021-01-10 ENCOUNTER — Telehealth: Payer: Self-pay

## 2021-01-10 MED ORDER — FLUCONAZOLE 150 MG PO TABS: 150.0000 mg | ORAL_TABLET | Freq: Once | ORAL | 0 refills | Status: AC

## 2021-01-10 NOTE — Telephone Encounter (Signed)
Done, pt will call bk if px persist

## 2021-01-10 NOTE — Telephone Encounter (Signed)
Send in diflucan 150mg  take 1 tablet po x 1 dose, repeat in 3 days  #2 tabs Refill 0   IF she has recurrent infections may need to come off Jardiance.

## 2021-01-12 ENCOUNTER — Telehealth: Payer: Self-pay

## 2021-01-12 NOTE — Telephone Encounter (Signed)
Patient called and thinks the medicine Jardiance that was prescribed to her feels like she having a allergic reaction.  Side effect patient having is itching yeast infection. Please contact patient [phone].

## 2021-01-12 NOTE — Telephone Encounter (Signed)
See phone note from 2 days ago, she was given diflucan STOP the jardiance  2 options, she can go back on metformin at 500mg  twice a day with meals Or she can try Glipizide5MG  XR once a day

## 2021-01-14 DIAGNOSIS — G894 Chronic pain syndrome: Secondary | ICD-10-CM | POA: Diagnosis not present

## 2021-01-15 MED ORDER — FLUCONAZOLE 150 MG PO TABS: 150.0000 mg | ORAL_TABLET | Freq: Once | ORAL | 0 refills | Status: AC

## 2021-01-15 MED ORDER — GLIPIZIDE ER 5 MG PO TB24: 5.0000 mg | ORAL_TABLET | Freq: Every day | ORAL | 1 refills | Status: DC

## 2021-01-15 NOTE — Addendum Note (Signed)
Addended by: Sheral Flow on: 01/15/2021 09:08 AM   Modules accepted: Orders

## 2021-01-15 NOTE — Telephone Encounter (Signed)
Call placed to patient and patient made aware.   Agreeable to plan. Prescription sent to pharmacy.   Advised if Sx do not improve, OV will be required.

## 2021-01-16 ENCOUNTER — Encounter: Payer: Self-pay | Admitting: Gastroenterology

## 2021-01-31 ENCOUNTER — Other Ambulatory Visit: Payer: Self-pay | Admitting: Physician Assistant

## 2021-01-31 DIAGNOSIS — M79609 Pain in unspecified limb: Secondary | ICD-10-CM | POA: Diagnosis not present

## 2021-01-31 DIAGNOSIS — E11610 Type 2 diabetes mellitus with diabetic neuropathic arthropathy: Secondary | ICD-10-CM | POA: Diagnosis not present

## 2021-01-31 DIAGNOSIS — G8929 Other chronic pain: Secondary | ICD-10-CM | POA: Diagnosis not present

## 2021-01-31 DIAGNOSIS — M25519 Pain in unspecified shoulder: Secondary | ICD-10-CM | POA: Diagnosis not present

## 2021-01-31 DIAGNOSIS — M47816 Spondylosis without myelopathy or radiculopathy, lumbar region: Secondary | ICD-10-CM | POA: Diagnosis not present

## 2021-01-31 DIAGNOSIS — M25559 Pain in unspecified hip: Secondary | ICD-10-CM | POA: Diagnosis not present

## 2021-01-31 DIAGNOSIS — M9951 Intervertebral disc stenosis of neural canal of cervical region: Secondary | ICD-10-CM | POA: Diagnosis not present

## 2021-01-31 DIAGNOSIS — G894 Chronic pain syndrome: Secondary | ICD-10-CM | POA: Diagnosis not present

## 2021-01-31 DIAGNOSIS — M9931 Osseous stenosis of neural canal of cervical region: Secondary | ICD-10-CM | POA: Diagnosis not present

## 2021-02-13 ENCOUNTER — Telehealth: Payer: Self-pay

## 2021-02-13 DIAGNOSIS — G894 Chronic pain syndrome: Secondary | ICD-10-CM | POA: Diagnosis not present

## 2021-02-13 NOTE — Telephone Encounter (Signed)
Phone call to patient to schedule an appointment for a follow up and TB test. Unable to reach patient no voicemail.

## 2021-02-20 ENCOUNTER — Other Ambulatory Visit: Payer: Self-pay

## 2021-02-20 ENCOUNTER — Ambulatory Visit (INDEPENDENT_AMBULATORY_CARE_PROVIDER_SITE_OTHER): Payer: Medicare Other | Admitting: Nurse Practitioner

## 2021-02-20 ENCOUNTER — Encounter: Payer: Self-pay | Admitting: Nurse Practitioner

## 2021-02-20 VITALS — BP 154/82 | HR 73 | Temp 98.1°F | Ht 62.0 in | Wt 190.2 lb

## 2021-02-20 DIAGNOSIS — E1149 Type 2 diabetes mellitus with other diabetic neurological complication: Secondary | ICD-10-CM | POA: Diagnosis not present

## 2021-02-20 MED ORDER — TRULICITY 0.75 MG/0.5ML ~~LOC~~ SOAJ: 0.7500 mg | SUBCUTANEOUS | 3 refills | Status: DC

## 2021-02-20 NOTE — Progress Notes (Signed)
Subjective:    Patient ID: Abigail [name], female    DOB: 1951-01-16, 70 y.o.   MRN: [id number]  HPI: Abigail [name] is a 70 y.o. female presenting for side effects with Glucotrol.  Chief Complaint  Patient presents with  . Medication Problem    Pt is here to discuss the glucotrol. Makes her constipated and fatigue. Stomach doctor gave her linzess, cannot take the med. Feel med is causing more tremors. Wants an alternative   DIABETES Reports that glucotrol has been making her tired, tremor worse, constipated.  Is wondering if there is another medication she can try. Hypoglycemic episodes:no Polydipsia/polyuria: yes Visual disturbance: yes Chest pain: no Paresthesias: yes Glucose Monitoring: yes  Accucheck frequency: random  Random glucose: 100-145 Blood Pressure Monitoring: daily Retinal Examination: Not up to Date Foot Exam: Up to Date Diabetic Education: Completed Pneumovax: Up to Date Influenza: Not up to Date Aspirin: no  Allergies  Allergen Reactions  . Cinnamon     Dry throat   . Statins     Outpatient Encounter Medications as of 02/20/2021  Medication Sig Note  . clobetasol (TEMOVATE) 0.05 % external solution Apply 1 application topically 2 (two) times daily.   . COSENTYX SENSOREADY, 300 MG, 150 MG/ML SOAJ INJECT TWO PENS SUBCUTANEOUSLY EVERY 4 WEEKS. REFRIGERATE. ALLOW 15 TO 30 MINUTES AT ROOM TEMP PRIOR TO ADMINISTRATION.   . diclofenac (FLECTOR) 1.3 % PTCH SMARTSIG:Topical   . Dulaglutide (TRULICITY) 2.40 XB/3.5HG SOPN Inject 0.75 mg into the skin once a week.   . DULoxetine (CYMBALTA) 30 MG capsule take 1 capsule by mouth once daily (IF TOLERATED)   . fluticasone (FLOVENT HFA) 220 MCG/ACT inhaler 2 puffs swallowed twice daily-do not eat or drink for 30 minutes after   . gabapentin (NEURONTIN) 300 MG capsule TAKE 3 CAPSULES BY MOUTH THREE TIMES DAILY (Patient taking differently: 4 caps in the morning and 4 in the evening per patient)   . glipiZIDE (GLUCOTROL  XL) 5 MG 24 hr tablet Take 1 tablet (5 mg total) by mouth daily with breakfast.   . glucose blood (FREESTYLE LITE) test strip Use as instructed to monitor FSBS 1x daily. Dx: E11.9   . glucose monitoring kit (FREESTYLE) monitoring kit 1 each by Does not apply route as needed for other. Dispense one glucometer of choice, testing strips/lancets for once per day glucose checks.  QS for 1 month, 11 refills.   . Lancets (FREESTYLE) lancets Use as instructed   . linaclotide (LINZESS) 145 MCG CAPS capsule Take 1 capsule (145 mcg total) by mouth daily before breakfast.   . lisinopril (ZESTRIL) 20 MG tablet Take 1 tablet (20 mg total) by mouth daily.   Marland Kitchen omeprazole (PRILOSEC) 40 MG capsule Take 1 capsule (40 mg total) by mouth 2 (two) times daily.   Marland Kitchen oxyCODONE (OXY IR/ROXICODONE) 5 MG immediate release tablet TK 1/2 TO 1 TABLET BY MOUTH 2 TO 3 TIMES PER DAY IF TOLERATED. NO NUCYNTA 11/09/2018: Pt taking 1 tablet per day  . Secukinumab, 300 MG Dose, (COSENTYX, 300 MG DOSE,) 150 MG/ML SOSY Inject 300 mg into the skin as directed. On week 0, 1, 2, 3, and 4.   . [DISCONTINUED] JARDIANCE 25 MG TABS tablet Take 25 mg by mouth daily.    No facility-administered encounter medications on file as of 02/20/2021.    Patient Active Problem List   Diagnosis Date Noted  . Dysphagia 09/15/2019  . Upper airway cough syndrome 02/12/2018  . Bronchiectasis without complication (  [place]) 02/12/2018  . Type 2 diabetes mellitus with neurological complications ([place]) 56/38/7564  . Diabetic neuropathy ([place]) 04/02/2017  . COPD (chronic obstructive pulmonary disease) ([place]) 04/02/2017  . Aortic valve sclerosis 04/02/2017  . Pulmonary nodule 05/21/2013  . IRRITABLE BOWEL SYNDROME 05/18/2008  . Hyperlipidemia 05/17/2008  . Essential hypertension 05/17/2008  . HEMORRHOIDS 05/17/2008  . GERD 05/17/2008  . FATTY LIVER DISEASE 05/17/2008  . PSORIASIS 05/17/2008  . Osteoarthritis 05/17/2008  . STRESS INCONTINENCE 05/17/2008    Past  Medical History:  Diagnosis Date  . Allergy   . Anxiety   . Arthritis    hands, knees  . Cancer ([place])   . Cataract    surgery to remove -bilateral  . COPD (chronic obstructive pulmonary disease) ([place])   . Depression   . Diabetes mellitus ([place])    a. A1c 6.6 in 05/2013 indicating new dx.  . Fatty liver   . Female stress incontinence   . GERD (gastroesophageal reflux disease)   . Heart murmur    no problems  . Hemorrhoids   . History of kidney stones    surgery to remove  . Hyperlipidemia   . Hyperplastic colon polyp 06/07/2008  . Hypertension   . Hypertriglyceridemia   . IBS (irritable bowel syndrome)   . Lung nodule    a. 71m left lung nodule by CT 05/2013.  .Marland KitchenNeuromuscular disorder (HCC)    neuropathy feet  . Obesity   . Psoriasis   . Skin cancer     Relevant past medical, surgical, family and social history reviewed and updated as indicated. Interim medical history since our last visit reviewed.  Review of Systems Per HPI unless specifically indicated above     Objective:    BP (!) 154/82   Pulse 73   Temp 98.1 F (36.7 C)   Ht _0  (1.575 m)   Wt 190 lb 3.2 oz (86.3 kg)   SpO2 97%   BMI 34.79 kg/m   Wt Readings from Last 3 Encounters:  02/20/21 190 lb 3.2 oz (86.3 kg)  12/27/20 180 lb (81.6 kg)  12/01/20 181 lb (82.1 kg)    Physical Exam Vitals and nursing note reviewed.  Constitutional:      General: She is not in acute distress.    Appearance: Normal appearance. She is obese. She is not toxic-appearing.  HENT:     Head: Normocephalic and atraumatic.  Eyes:     General: No scleral icterus.    Extraocular Movements: Extraocular movements intact.  Cardiovascular:     Rate and Rhythm: Normal rate and regular rhythm.     Heart sounds: Murmur heard.    Pulmonary:     Effort: Pulmonary effort is normal. No respiratory distress.     Breath sounds: Normal breath sounds. No wheezing, rhonchi or rales.  Abdominal:     General: Abdomen is flat.  Bowel sounds are normal.     Palpations: Abdomen is soft.     Tenderness: There is no abdominal tenderness.  Musculoskeletal:        General: No swelling or tenderness. Normal range of motion.     Cervical back: Normal range of motion.  Skin:    General: Skin is warm and dry.     Capillary Refill: Capillary refill takes less than 2 seconds.     Coloration: Skin is not jaundiced.     Findings: No bruising.  Neurological:     General: No focal deficit present.     Mental  Status: She is alert and oriented to person, place, and time.     Motor: No weakness.     Gait: Gait normal.  Psychiatric:        Mood and Affect: Mood normal.        Behavior: Behavior normal.        Thought Content: Thought content normal.        Judgment: Judgment normal.       Assessment & Plan:  1. 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  Reports not tolerating Glucotrol well.  Will start on dulaglutide 0.58m once weekly. Discussed side effects and demonstrated use with sample pen today.  No history of thyroid cancer. Encouraged patient to schedule eye exam.  Follow up in 1 month to recheck AU0Yand see how Trulicity is going.  Coupon given for free sample for first month and paper prescription printed.  - Dulaglutide (TRULICITY) 04.15MTX/0.1SFSOPN; Inject 0.75 mg into the skin once a week.  Dispense: 0.5 mL; Refill: 3    Follow up plan: Return in about 4 weeks (around 03/20/2021) for diabetes follow up.

## 2021-02-20 NOTE — Assessment & Plan Note (Signed)
Chronic.  Reports not tolerating Glucotrol well.  Will start on weekly injectable dulaglutide 0.75mg  once weekly. Encouraged patient to schedule eye exam.  Follow up in 1 month to recheck B7C and see how Trulicity is going.  Coupon given for free sample for first month and paper prescription printed.

## 2021-02-20 NOTE — Patient Instructions (Signed)
Dulaglutide Injection What is this medicine? DULAGLUTIDE (DOO la GLOO tide) controls blood sugar in people with type 2 diabetes. It is used with lifestyle changes like diet and exercise. It may lower the risk of problems that need treatment in the hospital. These problems include heart attack or stroke. This medicine may be used for other purposes; ask your health care provider or pharmacist if you have questions. COMMON BRAND NAME(S): Trulicity What should I tell my health care provider before I take this medicine? They need to know if you have any of these conditions:  endocrine tumors (MEN 2) or if someone in your family had these tumors  eye disease, vision problems  history of pancreatitis  kidney disease  liver disease  stomach or intestine problems  thyroid cancer or if someone in your family had thyroid cancer  an unusual or allergic reaction to dulaglutide, other medicines, foods, dyes, or preservatives  pregnant or trying to get pregnant  breast-feeding How should I use this medicine? This medicine is injected under the skin. You will be taught how to prepare and give it. Take it as directed on the prescription label on the same day of each week. Do NOT prime the pen. Keep taking it unless your health care provider tells you to stop. If you use this medicine with insulin, you should inject this medicine and the insulin separately. Do not mix them together. Do not give the injections right next to each other. Change (rotate) injection sites with each injection. This drug comes with INSTRUCTIONS FOR USE. Ask your pharmacist for directions on how to use this medicine. Read the information carefully. Talk to your pharmacist or health care provider if you have questions. It is important that you put your used needles and syringes in a special sharps container. Do not put them in a trash can. If you do not have a sharps container, call your pharmacist or health care provider to get  one. A special MedGuide will be given to you by the pharmacist with each prescription and refill. Be sure to read this information carefully each time. Talk to your health care provider about the use of this medicine in children. Special care may be needed. Overdosage: If you think you have taken too much of this medicine contact a poison control center or emergency room at once. NOTE: This medicine is only for you. Do not share this medicine with others. What if I miss a dose? If you miss a dose, take it as soon as you can unless it is more than 3 days late. If it is more than 3 days late, skip the missed dose. Take the next dose at the normal time. What may interact with this medicine?  other medicines for diabetes Many medications may cause changes in blood sugar, these include:  alcohol containing beverages  antiviral medicines for HIV or AIDS  aspirin and aspirin-like drugs  certain medicines for blood pressure, heart disease, irregular heart beat  chromium  diuretics  female hormones, such as estrogens or progestins, birth control pills  fenofibrate  gemfibrozil  isoniazid  lanreotide  female hormones or anabolic steroids  MAOIs like Carbex, Eldepryl, Marplan, Nardil, and Parnate  medicines for allergies, asthma, cold, or cough  medicines for depression, anxiety, or psychotic disturbances  medicines for weight loss  niacin  nicotine  NSAIDs, medicines for pain and inflammation, like ibuprofen or naproxen  octreotide  pasireotide  pentamidine  phenytoin  probenecid  quinolone antibiotics such as ciprofloxacin,  levofloxacin, ofloxacin  some herbal dietary supplements  steroid medicines such as prednisone or cortisone  sulfamethoxazole; trimethoprim  thyroid hormones Some medications can hide the warning symptoms of low blood sugar (hypoglycemia). You may need to monitor your blood sugar more closely if you are taking one of these medications.  These include:  beta-blockers, often used for high blood pressure or heart problems (examples include atenolol, metoprolol, propranolol)  clonidine  guanethidine  reserpine This list may not describe all possible interactions. Give your health care provider a list of all the medicines, herbs, non-prescription drugs, or dietary supplements you use. Also tell them if you smoke, drink alcohol, or use illegal drugs. Some items may interact with your medicine. What should I watch for while using this medicine? Visit your health care provider for regular checks on your progress. Check with your health care provider if you have severe diarrhea, nausea, and vomiting, or if you sweat a lot. The loss of too much body fluid may make it dangerous for you to take this medicine. A test called the HbA1C (A1C) will be monitored. This is a simple blood test. It measures your blood sugar control over the last 2 to 3 months. You will receive this test every 3 to 6 months. Learn how to check your blood sugar. Learn the symptoms of low and high blood sugar and how to manage them. Always carry a quick-source of sugar with you in case you have symptoms of low blood sugar. Examples include hard sugar candy or glucose tablets. Make sure others know that you can choke if you eat or drink when you develop serious symptoms of low blood sugar, such as seizures or unconsciousness. Get medical help at once. Tell your health care provider if you have high blood sugar. You might need to change the dose of your medicine. If you are sick or exercising more than usual, you may need to change the dose of your medicine. Do not skip meals. Ask your health care provider if you should avoid alcohol. Many nonprescription cough and cold products contain sugar or alcohol. These can affect blood sugar. Pens should never be shared. Even if the needle is changed, sharing may result in passing of viruses like hepatitis or HIV. Wear a medical ID  bracelet or chain. Carry a card that describes your condition. List the medicines and doses you take on the card. What side effects may I notice from receiving this medicine? Side effects that you should report to your doctor or health care professional as soon as possible:  allergic reactions (skin rash, itching or hives; swelling of the face, lips, or tongue)  changes in vision  diarrhea that continues or is severe  infection (fever, chills, cough, sore throat, pain or trouble passing urine)  kidney injury (trouble passing urine or change in the amount of urine)  low blood sugar (feeling anxious; confusion; dizziness; increased hunger; unusually weak or tired; increased sweating; shakiness; cold, clammy skin; irritable; headache; blurred vision; fast heartbeat; loss of consciousness)  lump or swelling on the neck  trouble breathing  trouble swallowing  unusual stomach upset or pain  vomiting Side effects that usually do not require medical attention (report to your doctor or health care professional if they continue or are bothersome):  lack or loss of appetite  nausea  pain, redness, or irritation at site where injected This list may not describe all possible side effects. Call your doctor for medical advice about side effects. You may report  side effects to FDA at 1-800-FDA-1088. Where should I keep my medicine? Keep out of the reach of children and pets. Refrigeration (preferred): Store unopened pens in a refrigerator between 2 and 8 degrees C (36 and 46 degrees F). Keep it in the original carton until you are ready to take it. Do not freeze or use if the medicine has been frozen. Protect from light. Get rid of any unused medicine after the expiration date on the label. Room Temperature: The pen may be stored at room temperature below 30 degrees C (86 degrees F) for up to a total of 14 days if needed. Protect from light. Avoid exposure to extreme heat. If it is stored at room  temperature, throw away any unused medicine after 14 days or after it expires, whichever is first. To get rid of medicines that are no longer needed or have expired:  Take the medicine to a medicine take-back program. Check with your pharmacy or law enforcement to find a location.  If you cannot return the medicine, ask your pharmacist or health care provider how to get rid of this medicine safely. NOTE: This sheet is a summary. It may not cover all possible information. If you have questions about this medicine, talk to your doctor, pharmacist, or health care provider.  2021 Elsevier/Gold Standard (2020-08-21 07:35:51)

## 2021-02-21 ENCOUNTER — Telehealth: Payer: Self-pay | Admitting: Physician Assistant

## 2021-02-21 MED ORDER — COSENTYX SENSOREADY (300 MG) 150 MG/ML ~~LOC~~ SOAJ: SUBCUTANEOUS | 3 refills | Status: DC

## 2021-02-21 NOTE — Telephone Encounter (Signed)
[facility] is calling to get a new prescription for patient for Cosentyx pens.

## 2021-02-22 ENCOUNTER — Encounter: Payer: Self-pay | Admitting: Physician Assistant

## 2021-02-22 ENCOUNTER — Other Ambulatory Visit: Payer: Self-pay

## 2021-02-22 ENCOUNTER — Ambulatory Visit (INDEPENDENT_AMBULATORY_CARE_PROVIDER_SITE_OTHER): Payer: Medicare Other | Admitting: Physician Assistant

## 2021-02-22 DIAGNOSIS — L400 Psoriasis vulgaris: Secondary | ICD-10-CM

## 2021-02-22 DIAGNOSIS — L570 Actinic keratosis: Secondary | ICD-10-CM | POA: Diagnosis not present

## 2021-02-22 DIAGNOSIS — Z79899 Other long term (current) drug therapy: Secondary | ICD-10-CM | POA: Diagnosis not present

## 2021-02-22 MED ORDER — COSENTYX SENSOREADY (300 MG) 150 MG/ML ~~LOC~~ SOAJ: 300.0000 mg | SUBCUTANEOUS | 0 refills | Status: DC

## 2021-02-24 LAB — QUANTIFERON-TB GOLD PLUS
Mitogen-NIL: >10 IU/mL
NIL: 0.02 IU/mL
QuantiFERON-TB Gold Plus: NEGATIVE
TB1-NIL: 0 IU/mL
TB2-NIL: 0.01 IU/mL

## 2021-02-27 ENCOUNTER — Encounter: Payer: Self-pay | Admitting: Physician Assistant

## 2021-02-28 DIAGNOSIS — G894 Chronic pain syndrome: Secondary | ICD-10-CM | POA: Diagnosis not present

## 2021-03-06 ENCOUNTER — Other Ambulatory Visit: Payer: Self-pay

## 2021-03-06 ENCOUNTER — Encounter: Payer: Self-pay | Admitting: Gastroenterology

## 2021-03-06 ENCOUNTER — Telehealth: Payer: Self-pay

## 2021-03-06 ENCOUNTER — Ambulatory Visit (AMBULATORY_SURGERY_CENTER): Payer: Medicare Other | Admitting: Gastroenterology

## 2021-03-06 VITALS — BP 138/80 | HR 72 | Temp 97.5°F | Resp 16 | Ht 62.0 in | Wt 180.0 lb

## 2021-03-06 DIAGNOSIS — K209 Esophagitis, unspecified without bleeding: Secondary | ICD-10-CM | POA: Diagnosis not present

## 2021-03-06 DIAGNOSIS — K200 Eosinophilic esophagitis: Secondary | ICD-10-CM | POA: Diagnosis not present

## 2021-03-06 DIAGNOSIS — K219 Gastro-esophageal reflux disease without esophagitis: Secondary | ICD-10-CM | POA: Diagnosis not present

## 2021-03-06 MED ORDER — SODIUM CHLORIDE 0.9 % IV SOLN: 500.0000 mL | Freq: Once | INTRAVENOUS | Status: DC

## 2021-03-06 MED ORDER — FLUCONAZOLE 100 MG PO TABS: 100.0000 mg | ORAL_TABLET | Freq: Every day | ORAL | 0 refills | Status: DC

## 2021-03-06 NOTE — Telephone Encounter (Signed)
Fax received from [facility] needing a prior authorization for the patient's Cosentyx.

## 2021-03-06 NOTE — Progress Notes (Signed)
August schedule isn't available at this time to set up 3 month EGD- recall put in

## 2021-03-06 NOTE — Op Note (Signed)
[place] Patient Name: Abigail [name] Procedure Date: 03/06/2021 8:59 AM MRN: [id number] Endoscopist: Mauri Pole , MD Age: 70 Referring MD:  Date of Birth: Feb 05, 1951 Gender: Female Account #: [id number] Procedure:                Upper GI endoscopy Indications:              Follow-up of eosinophilic esophagitis, Dysphagia Medicines:                Monitored Anesthesia Care Procedure:                Pre-Anesthesia Assessment:                           - Prior to the procedure, a History and Physical                            was performed, and patient medications and                            allergies were reviewed. The patient's tolerance of                            previous anesthesia was also reviewed. The risks                            and benefits of the procedure and the sedation                            options and risks were discussed with the patient.                            All questions were answered, and informed consent                            was obtained. Prior Anticoagulants: The patient has                            taken no previous anticoagulant or antiplatelet                            agents. ASA Grade Assessment: II - A patient with                            mild systemic disease. After reviewing the risks                            and benefits, the patient was deemed in                            satisfactory condition to undergo the procedure.                           After obtaining informed consent, the endoscope was  passed under direct vision. Throughout the                            procedure, the patient's blood pressure, pulse, and                            oxygen saturations were monitored continuously. The                            Endoscope was introduced through the mouth, and                            advanced to the second part of duodenum. The upper                            GI  endoscopy was accomplished without difficulty.                            The patient tolerated the procedure well. Scope In: Scope Out: Findings:                 LA Grade B (one or more mucosal breaks greater than                            5 mm, not extending between the tops of two mucosal                            folds) esophagitis with no bleeding was found 30 to                            38 cm from the incisors. Biopsies were obtained                            from the proximal and distal esophagus with cold                            forceps for histology of suspected eosinophilic                            esophagitis.                           Esophagitis was found 36 to 38 cm from the                            incisors. Biopsies were taken with a cold forceps                            for histology.                           Mucosal changes including ringed esophagus, white  plaques, congestion (edema) and crepe paper                            esophagus were found in the entire esophagus.                            Esophageal findings were graded using the                            Eosinophilic Esophagitis Endoscopic Reference Score                            (EoE-EREFS) as: Edema Grade 1 Present (decreased                            clarity or absence of vascular markings), Rings                            Grade 1 Mild (subtle circumferential ridges seen on                            esophageal distension), Exudates Grade 1 Mild                            (scattered white lesions involving less than 10                            percent of the esophageal surface area) and Furrows                            Grade 0 None (no vertical lines seen). Biopsies                            were obtained from the proximal and distal                            esophagus with cold forceps for histology of                            suspected eosinophilic  esophagitis.                           A large amount of a phytobezoar was found in the                            gastric body.                           The examined duodenum was normal. Complications:            No immediate complications. Estimated Blood Loss:     Estimated blood loss was minimal. Impression:               - LA Grade B esophagitis with no bleeding. Biopsied.                           -  Candidiasis esophagitis. Biopsied.                           - Esophageal mucosal changes consistent with                            eosinophilic esophagitis. Biopsied.                           - A large amount of a phytobezoar in the stomach.                           - Normal examined duodenum. Recommendation:           - Patient has a contact number available for                            emergencies. The signs and symptoms of potential                            delayed complications were discussed with the                            patient. Return to normal activities tomorrow.                            Written discharge instructions were provided to the                            patient.                           - Gastroparesis diet.                           - Continue present medications.                           - Await pathology results.                           - Repeat upper endoscopy in 3 months for                            surveillance based on pathology results.                           - Return to GI office at the next available                            appointment in 2 months. Please call to schedule                            appointment.                           - Fluconazole 100mg  daily X 3 days, please send  Rx                           - Schedule 4 hour gastric emptying scan Mauri Pole, MD 03/06/2021 9:30:58 AM This report has been signed electronically.

## 2021-03-06 NOTE — Progress Notes (Signed)
VS by CW  Pt's states no medical or surgical changes since previsit or office visit.  

## 2021-03-06 NOTE — Progress Notes (Signed)
To PACU, VSS. report to Rn.tb °

## 2021-03-06 NOTE — Progress Notes (Signed)
Called to room to assist during endoscopic procedure.  Patient ID and intended procedure confirmed with present staff. Received instructions for my participation in the procedure from the performing physician.  

## 2021-03-06 NOTE — Patient Instructions (Signed)
Await pathology   Please pick up Diflucan from pharmacy- take 100 mg tablet daily for 3 days  Dr. Woodward Ku office will call you to set up follow up office visit and gastric emptying scan  Please read over handout about esophagitis   YOU HAD AN ENDOSCOPIC PROCEDURE TODAY AT [facility]:   Refer to the procedure report that was given to you for any specific questions about what was found during the examination.  If the procedure report does not answer your questions, please call your gastroenterologist to clarify.  If you requested that your care partner not be given the details of your procedure findings, then the procedure report has been included in a sealed envelope for you to review at your convenience later.  YOU SHOULD EXPECT: Some feelings of bloating in the abdomen. Passage of more gas than usual.  Walking can help get rid of the air that was put into your GI tract during the procedure and reduce the bloating.   Please Note:  You might notice some irritation and congestion in your nose or some drainage.  This is from the oxygen used during your procedure.  There is no need for concern and it should clear up in a day or so.  SYMPTOMS TO REPORT IMMEDIATELY:   Following upper endoscopy (EGD)  Vomiting of blood or coffee ground material  New chest pain or pain under the shoulder blades  Painful or persistently difficult swallowing  New shortness of breath  Fever of 100F or higher  Black, tarry-looking stools  For urgent or emergent issues, a gastroenterologist can be reached at any hour by calling [phone]. Do not use MyChart messaging for urgent concerns.    DIET:  We do recommend a small meal at first, but then you may proceed to your regular diet.  Drink plenty of fluids but you should avoid alcoholic beverages for 24 hours.  ACTIVITY:  You should plan to take it easy for the rest of today and you should NOT DRIVE or use heavy machinery until tomorrow  (because of the sedation medicines used during the test).    FOLLOW UP: Our staff will call the number listed on your records 48-72 hours following your procedure to check on you and address any questions or concerns that you may have regarding the information given to you following your procedure. If we do not reach you, we will leave a message.  We will attempt to reach you two times.  During this call, we will ask if you have developed any symptoms of COVID 19. If you develop any symptoms (ie: fever, flu-like symptoms, shortness of breath, cough etc.) before then, please call [phone].  If you test positive for Covid 19 in the 2 weeks post procedure, please call and report this information to [place].    If any biopsies were taken you will be contacted by phone or by letter within the next 1-3 weeks.  Please call us at [phone] if you have not heard about the biopsies in 3 weeks.    SIGNATURES/CONFIDENTIALITY: You and/or your care partner have signed paperwork which will be entered into your electronic medical record.  These signatures attest to the fact that that the information above on your After Visit Summary has been reviewed and is understood.  Full responsibility of the confidentiality of this discharge information lies with you and/or your care-partner.

## 2021-03-06 NOTE — Telephone Encounter (Signed)
Prior authorization faxed to [facility] for the patient's Cosentyx.

## 2021-03-08 ENCOUNTER — Telehealth: Payer: Self-pay | Admitting: *Deleted

## 2021-03-08 NOTE — Telephone Encounter (Signed)
  Follow up Call-  Call back number 03/06/2021 09/22/2020 12/23/2018 11/17/2018  Post procedure Call Back phone  # [phone]  Permission to leave phone message Yes Yes Yes Yes  Some recent data might be hidden     Patient questions:  Do you have a fever, pain , or abdominal swelling? No. Pain Score  0 *  Have you tolerated food without any problems? Yes.    Have you been able to return to your normal activities? Yes.    Do you have any questions about your discharge instructions: Diet   No. Medications  No. Follow up visit  No.  Do you have questions or concerns about your Care? No.  Actions: * If pain score is 4 or above: No action needed, pain <4.  1. Have you developed a fever since your procedure? no  2.   Have you had an respiratory symptoms (SOB or cough) since your procedure? no  3.   Have you tested positive for COVID 19 since your procedure no  4.   Have you had any family members/close contacts diagnosed with the COVID 19 since your procedure?  no   If yes to any of these questions please route to Joylene John, RN and Joella Prince, RN

## 2021-03-08 NOTE — Telephone Encounter (Signed)
Follow up call made. 

## 2021-03-12 NOTE — Telephone Encounter (Signed)
Fax received from [facility] stating that the patient's Cosentyx is approved from 03/07/2021-03/07/2022.

## 2021-03-13 ENCOUNTER — Ambulatory Visit (INDEPENDENT_AMBULATORY_CARE_PROVIDER_SITE_OTHER): Payer: Medicare Other | Admitting: Orthopaedic Surgery

## 2021-03-13 ENCOUNTER — Other Ambulatory Visit: Payer: Self-pay

## 2021-03-13 ENCOUNTER — Ambulatory Visit: Payer: Self-pay

## 2021-03-13 DIAGNOSIS — R112 Nausea with vomiting, unspecified: Secondary | ICD-10-CM

## 2021-03-13 DIAGNOSIS — R1013 Epigastric pain: Secondary | ICD-10-CM

## 2021-03-13 DIAGNOSIS — G8929 Other chronic pain: Secondary | ICD-10-CM

## 2021-03-13 DIAGNOSIS — M25562 Pain in left knee: Secondary | ICD-10-CM

## 2021-03-13 MED ORDER — METHYLPREDNISOLONE ACETATE 40 MG/ML IJ SUSP
40.0000 mg | INTRAMUSCULAR | Status: AC | PRN
  Administered 2021-03-13: 40 mg via INTRA_ARTICULAR

## 2021-03-13 MED ORDER — BUPIVACAINE HCL 0.5 % IJ SOLN
2.0000 mL | INTRAMUSCULAR | Status: AC | PRN
  Administered 2021-03-13: 2 mL via INTRA_ARTICULAR

## 2021-03-13 MED ORDER — LIDOCAINE HCL 1 % IJ SOLN
2.0000 mL | INTRAMUSCULAR | Status: AC | PRN
  Administered 2021-03-13: 2 mL

## 2021-03-13 NOTE — Progress Notes (Signed)
Office Visit Note   Patient: Abigail [name]           Date of Birth: December 06, 1950           MRN: [id number] Visit Date: 03/13/2021              Requested by: Abigail Bear, NP [address]  [place] [address] PCP: Abigail Bear, NP   Assessment & Plan: Visit Diagnoses:  1. Chronic pain of left knee     Plan: My impression is that she may have torn her medial meniscus or bony contusion to the medial tibial plateau.  Based on her options she agreed to try cortisone injection to see how she responds to this.  She she will follow-up in about 6 weeks if she does not feel any improvement from this injection.  Follow-Up Instructions: Return if symptoms worsen or fail to improve.   Orders:  Orders Placed This Encounter  Procedures  . XR KNEE 3 VIEW LEFT   No orders of the defined types were placed in this encounter.     Procedures: Large Joint Inj: L knee on 03/13/2021 4:25 PM Details: 22 G needle Medications: 2 mL bupivacaine 0.5 %; 2 mL lidocaine 1 %; 40 mg methylPREDNISolone acetate 40 MG/ML Outcome: tolerated well, no immediate complications Patient was prepped and draped in the usual sterile fashion.       Clinical Data: No additional findings.   Subjective: Chief Complaint  Patient presents with  . Left Knee - Pain    Abigail [name] is a 70 year old female comes in for evaluation of left knee pain that started about a week ago which he stepped in a hole.  She is having pain on the medial side of the knee.  She is having difficulty weightbearing with sudden sharp pain.  Reports some clicking and catching.     Review of Systems  Constitutional: Negative.   HENT: Negative.   Eyes: Negative.   Respiratory: Negative.   Cardiovascular: Negative.   Endocrine: Negative.   Musculoskeletal: Negative.   Neurological: Negative.   Hematological: Negative.   Psychiatric/Behavioral: Negative.   All other systems reviewed and are  negative.    Objective: Vital Signs: There were no vitals taken for this visit.  Physical Exam Vitals and nursing note reviewed.  Constitutional:      Appearance: She is well-developed.  Pulmonary:     Effort: Pulmonary effort is normal.  Skin:    General: Skin is warm.     Capillary Refill: Capillary refill takes less than 2 seconds.  Neurological:     Mental Status: She is alert and oriented to person, place, and time.  Psychiatric:        Behavior: Behavior normal.        Thought Content: Thought content normal.        Judgment: Judgment normal.     Ortho Exam Left knee shows no joint effusion.  Medial joint line tenderness with positive McMurray.  Pain at the medial side and joint line with flexion past 90 degrees.  Causing cruciates are stable. Specialty Comments:  No specialty comments available.  Imaging: XR KNEE 3 VIEW LEFT  Result Date: 03/13/2021 No acute or structural abnormalities    PMFS History: Patient Active Problem List   Diagnosis Date Noted  . Dysphagia 09/15/2019  . Upper airway cough syndrome 02/12/2018  . Bronchiectasis without complication ([place]) 74/25/9563  . Type 2 diabetes mellitus with neurological complications ([place]) 87/56/4332  .  Diabetic neuropathy ([place]) 04/02/2017  . COPD (chronic obstructive pulmonary disease) ([place]) 04/02/2017  . Aortic valve sclerosis 04/02/2017  . Pulmonary nodule 05/21/2013  . IRRITABLE BOWEL SYNDROME 05/18/2008  . Hyperlipidemia 05/17/2008  . Essential hypertension 05/17/2008  . HEMORRHOIDS 05/17/2008  . GERD 05/17/2008  . FATTY LIVER DISEASE 05/17/2008  . PSORIASIS 05/17/2008  . Osteoarthritis 05/17/2008  . STRESS INCONTINENCE 05/17/2008   Past Medical History:  Diagnosis Date  . Allergy   . Anxiety   . Arthritis    hands, knees  . Cancer ([place])   . Cataract    surgery to remove -bilateral  . COPD (chronic obstructive pulmonary disease) ([place])   . Depression   . Diabetes mellitus ([facility])    a. A1c  6.6 in 05/2013 indicating new dx.  . Fatty liver   . Female stress incontinence   . GERD (gastroesophageal reflux disease)   . Heart murmur    no problems  . Hemorrhoids   . History of kidney stones    surgery to remove  . Hyperlipidemia   . Hyperplastic colon polyp 06/07/2008  . Hypertension   . Hypertriglyceridemia   . IBS (irritable bowel syndrome)   . Lung nodule    a. 80mm left lung nodule by CT 05/2013.  Marland Kitchen Neuromuscular disorder (HCC)    neuropathy feet  . Obesity   . Psoriasis   . Skin cancer     Family History  Problem Relation Age of Onset  . Breast cancer Mother   . Cancer Mother   . Heart disease Maternal Grandfather   . Colon polyps Maternal Aunt   . Kidney cancer Neg Hx   . Bladder Cancer Neg Hx   . Colon cancer Neg Hx   . Stomach cancer Neg Hx   . Rectal cancer Neg Hx   . Pancreatic cancer Neg Hx   . Esophageal cancer Neg Hx     Past Surgical History:  Procedure Laterality Date  . CATARACT EXTRACTION W/PHACO Left 10/06/2014   Procedure: CATARACT EXTRACTION PHACO AND INTRAOCULAR LENS PLACEMENT LEFT EYE;  Surgeon: Tonny Branch, MD;  Location: [facility];  Service: Ophthalmology;  Laterality: Left;  CDE 7.35  . CATARACT EXTRACTION W/PHACO Right 11/14/2014   Procedure: CATARACT EXTRACTION PHACO AND INTRAOCULAR LENS PLACEMENT RIGHT EYE;  Surgeon: Tonny Branch, MD;  Location: [facility];  Service: Ophthalmology;  Laterality: Right;  CDE:6.39  . COLONOSCOPY  06/2008   hx polyps  . CYSTOSCOPY W/ RETROGRADES Right 01/21/2017   Procedure: CYSTOSCOPY WITH RETROGRADE PYELOGRAM;  Surgeon: Hollice Espy, MD;  Location: [facility];  Service: Urology;  Laterality: Right;  . CYSTOSCOPY/URETEROSCOPY/HOLMIUM LASER/STENT PLACEMENT Left 01/21/2017   Procedure: CYSTOSCOPY/URETEROSCOPY/HOLMIUM LASER/STENT PLACEMENT;  Surgeon: Hollice Espy, MD;  Location: [facility];  Service: Urology;  Laterality: Left;  . DORSAL COMPARTMENT RELEASE Left 06/15/2015   Procedure: LEFT WRIST DEQUERVAINS;  Surgeon:  Ninetta Lights, MD;  Location: [facility];  Service: Orthopedics;  Laterality: Left;  . HERNIA REPAIR    . MOUTH SURGERY     tooth ext  . NASAL SINUS SURGERY    . SHOULDER SURGERY     right  . TONSILLECTOMY    . UPPER GI ENDOSCOPY     normal per patient  . VENTRAL HERNIA REPAIR     Social History   Occupational History  . Occupation: retired  Tobacco Use  . Smoking status: Never Smoker  . Smokeless tobacco: Never Used  Vaping Use  . Vaping Use: Never used  Substance and Sexual Activity  . Alcohol use: No  . Drug use: No  . Sexual activity: Yes    Birth control/protection: Post-menopausal

## 2021-03-19 NOTE — Progress Notes (Signed)
   Follow-Up Visit   Subjective  Abigail [name] is a 70 y.o. female who presents for the following: Follow-up Psoriasis for years. Here to refill cosentyx and tb test.  Patient stated that she has a flare on buttocks and has a clobetasol cream that she uses to help.  She does have arthritis but has not experienced any swollen fingers or tendons. She is tolerating cosentyx well and not having any side effects.  Her flare is helped with clobetasol but she has to use it sparingly due to high potency. She has been out of medication for a few months.   The following portions of the chart were reviewed this encounter and updated as appropriate:  Tobacco  Allergies  Meds  Problems  Med Hx  Surg Hx  Fam Hx      Objective  Well appearing patient in no apparent distress; mood and affect are within normal limits.  A full examination was performed including scalp, head, eyes, ears, nose, lips, neck, chest, axillae, abdomen, back, buttocks, bilateral upper extremities, bilateral lower extremities, hands, feet, fingers, toes, fingernails, and toenails. All findings within normal limits unless otherwise noted below.  Objective  Gluteal Crease, elbows and dorsum of hands: Well-marginated erythematous large plaques with silvery scale.  No joint swelling, tenosynovitis or dactylitis. Her arthritis definitely flares when she is off of the medication.  Objective  Dorsum of Nose, Left Buccal Cheek, Left Dorsal Hand (10), Left Forearm - Posterior (10), Mid Upper Vermilion Lip, Right Forearm - Posterior (10), Right Hand - Posterior (10): Erythematous patches with gritty scale.   Assessment & Plan  Plaque psoriasis Gluteal Crease, elbows and dorsum of hands  Patient was doing well on cosentyx. Gave patient sample. Yearly TB Quantiferon gold.  QuantiFERON-TB Gold Plus - Gluteal Crease, elbows and dorsum of hands  Drug therapy  Psoriasis vulgaris  AK (actinic keratosis) (43) Right Hand - Posterior  (10); Left Forearm - Posterior (10); Right Forearm - Posterior (10); Left Dorsal Hand (10); Dorsum of Nose; Mid Upper Vermilion Lip; Left Buccal Cheek  Destruction of lesion - Dorsum of Nose, Left Buccal Cheek, Left Dorsal Hand, Left Forearm - Posterior, Mid Upper Vermilion Lip, Right Forearm - Posterior, Right Hand - Posterior Complexity: simple   Destruction method: cryotherapy   Informed consent: discussed and consent obtained   Timeout:  patient name, date of birth, surgical site, and procedure verified Lesion destroyed using liquid nitrogen: Yes   Cryotherapy cycles:  1 Outcome: patient tolerated procedure well with no complications   Post-procedure details: wound care instructions given      I, Malaky Tetrault, PA-C, have reviewed all documentation's for this visit.  The documentation on 03/19/21 for the exam, diagnosis, procedures and orders are all accurate and complete.

## 2021-03-19 NOTE — Addendum Note (Signed)
Addended by: Robyne Askew R on: 03/19/2021 10:26 AM   Modules accepted: Level of Service

## 2021-03-21 ENCOUNTER — Ambulatory Visit: Payer: Medicare Other | Admitting: Nurse Practitioner

## 2021-03-22 ENCOUNTER — Encounter: Payer: Self-pay | Admitting: Nurse Practitioner

## 2021-03-22 ENCOUNTER — Other Ambulatory Visit: Payer: Self-pay

## 2021-03-22 ENCOUNTER — Ambulatory Visit (INDEPENDENT_AMBULATORY_CARE_PROVIDER_SITE_OTHER): Payer: Medicare Other | Admitting: Nurse Practitioner

## 2021-03-22 VITALS — BP 110/62 | HR 78 | Temp 98.3°F | Ht 64.0 in | Wt 184.0 lb

## 2021-03-22 DIAGNOSIS — I358 Other nonrheumatic aortic valve disorders: Secondary | ICD-10-CM | POA: Diagnosis not present

## 2021-03-22 DIAGNOSIS — E1149 Type 2 diabetes mellitus with other diabetic neurological complication: Secondary | ICD-10-CM | POA: Diagnosis not present

## 2021-03-22 NOTE — Assessment & Plan Note (Signed)
Apparent on echo in 2014.  Patient does have murmur on examination-we will obtain repeat echo to reevaluate and monitor for any progression.  No signs or symptoms of decreased cardiac output or heart failure today.

## 2021-03-22 NOTE — Assessment & Plan Note (Signed)
Chronic.  Tolerating dulaglutide 0.75 mg once weekly well.  We will continue this medication for now and follow-up in 1 month for wellness exam.  Encouraged patient to schedule eye exam.

## 2021-03-22 NOTE — Progress Notes (Signed)
Subjective:    Patient ID: Abigail [name], female    DOB: [birth year], 70 y.o.   MRN: [id number]  HPI: Abigail [name] is a 70 y.o. female presenting for diabetes follow up.  Chief Complaint  Patient presents with  . Follow-up    Not taking medication as prescribed, not remembering to take the med. Not sure if the trulicity caused constipation and bloating. Has only missed 1 dose of trulicy. Has not taken today dose   DIABETES Does not know how its going with the Trulicity.  She missed her fourth dose last week.  For the 3-week she has been taking it, she is tolerating it well.  She does report some constipation but it resolved on its own without medication. Hypoglycemic episodes:no Polydipsia/polyuria: no Visual disturbance: no; needs to see eye DR Chest pain: no Paresthesias: yes Glucose Monitoring: Yes  Accucheck frequency: random times;  Fasting glucose: 130s  Taking Insulin?: no Blood Pressure Monitoring: daily Retinal Examination: Not up to Date Foot Exam: Up to Date Diabetic Education: Completed Pneumovax: Up to Date Influenza: Not up to Date Aspirin: no  Allergies  Allergen Reactions  . Cinnamon     Dry throat   . Statins     Outpatient Encounter Medications as of 03/22/2021  Medication Sig Note  . clobetasol (TEMOVATE) 0.05 % external solution Apply 1 application topically 2 (two) times daily.   . COSENTYX SENSOREADY, 300 MG, 150 MG/ML SOAJ INJECT TWO PENS SUBCUTANEOUSLY EVERY 4 WEEKS. REFRIGERATE. ALLOW 15 TO 30 MINUTES AT ROOM TEMP PRIOR TO ADMINISTRATION.   . diclofenac (FLECTOR) 1.3 % PTCH SMARTSIG:Topical   . Dulaglutide (TRULICITY) 6.97 XY/8.0XK SOPN Inject 0.75 mg into the skin once a week.   . DULoxetine (CYMBALTA) 30 MG capsule take 1 capsule by mouth once daily (IF TOLERATED)   . fluconazole (DIFLUCAN) 100 MG tablet Take 1 tablet (100 mg total) by mouth daily. Take daily for 3 days   . fluticasone (FLOVENT HFA) 220 MCG/ACT inhaler 2 puffs swallowed twice  daily-do not eat or drink for 30 minutes after (Patient not taking: No sig reported)   . gabapentin (NEURONTIN) 300 MG capsule TAKE 3 CAPSULES BY MOUTH THREE TIMES DAILY (Patient taking differently: 4 caps in the morning and 4 in the evening per patient)   . glipiZIDE (GLUCOTROL XL) 5 MG 24 hr tablet Take 1 tablet (5 mg total) by mouth daily with breakfast. (Patient not taking: No sig reported)   . glucose blood (FREESTYLE LITE) test strip Use as instructed to monitor FSBS 1x daily. Dx: E11.9   . glucose monitoring kit (FREESTYLE) monitoring kit 1 each by Does not apply route as needed for other. Dispense one glucometer of choice, testing strips/lancets for once per day glucose checks.  QS for 1 month, 11 refills.   . Lancets (FREESTYLE) lancets Use as instructed   . linaclotide (LINZESS) 145 MCG CAPS capsule Take 1 capsule (145 mcg total) by mouth daily before breakfast. (Patient not taking: No sig reported)   . lisinopril (ZESTRIL) 20 MG tablet Take 1 tablet (20 mg total) by mouth daily.   Marland Kitchen omeprazole (PRILOSEC) 40 MG capsule Take 1 capsule (40 mg total) by mouth 2 (two) times daily.   Marland Kitchen oxyCODONE (OXY IR/ROXICODONE) 5 MG immediate release tablet TK 1/2 TO 1 TABLET BY MOUTH 2 TO 3 TIMES PER DAY IF TOLERATED. NO NUCYNTA 11/09/2018: Pt taking 1 tablet per day   No facility-administered encounter medications on file as of 03/22/2021.  Patient Active Problem List   Diagnosis Date Noted  . Dysphagia 09/15/2019  . Upper airway cough syndrome 02/12/2018  . Bronchiectasis without complication ([place]) 76/72/0947  . Type 2 diabetes mellitus with neurological complications ([place]) 09/62/8366  . Diabetic neuropathy ([place]) 04/02/2017  . COPD (chronic obstructive pulmonary disease) ([place]) 04/02/2017  . Aortic valve sclerosis 04/02/2017  . Pulmonary nodule 05/21/2013  . IRRITABLE BOWEL SYNDROME 05/18/2008  . Hyperlipidemia 05/17/2008  . Essential hypertension 05/17/2008  . HEMORRHOIDS 05/17/2008  . GERD  05/17/2008  . FATTY LIVER DISEASE 05/17/2008  . PSORIASIS 05/17/2008  . Osteoarthritis 05/17/2008  . STRESS INCONTINENCE 05/17/2008    Past Medical History:  Diagnosis Date  . Allergy   . Anxiety   . Arthritis    hands, knees  . Cancer ([place])   . Cataract    surgery to remove -bilateral  . COPD (chronic obstructive pulmonary disease) ([place])   . Depression   . Diabetes mellitus ([place])    a. A1c 6.6 in 05/2013 indicating new dx.  . Fatty liver   . Female stress incontinence   . GERD (gastroesophageal reflux disease)   . Heart murmur    no problems  . Hemorrhoids   . History of kidney stones    surgery to remove  . Hyperlipidemia   . Hyperplastic colon polyp 06/07/2008  . Hypertension   . Hypertriglyceridemia   . IBS (irritable bowel syndrome)   . Lung nodule    a. 7m left lung nodule by CT 05/2013.  .Marland KitchenNeuromuscular disorder (HCC)    neuropathy feet  . Obesity   . Psoriasis   . Skin cancer     Relevant past medical, surgical, family and social history reviewed and updated as indicated. Interim medical history since our last visit reviewed.  Review of Systems Per HPI unless specifically indicated above     Objective:    BP 110/62   Pulse 78   Temp 98.3 F (36.8 C)   Ht 5' 4"  (1.626 m)   Wt 184 lb (83.5 kg)   SpO2 98%   BMI 31.58 kg/m   Wt Readings from Last 3 Encounters:  03/22/21 184 lb (83.5 kg)  03/06/21 180 lb (81.6 kg)  02/20/21 190 lb 3.2 oz (86.3 kg)    Physical Exam Vitals and nursing note reviewed.  Constitutional:      General: She is not in acute distress.    Appearance: Normal appearance. She is obese. She is not toxic-appearing.  Cardiovascular:     Rate and Rhythm: Normal rate and regular rhythm.     Heart sounds: Murmur heard.    Pulmonary:     Effort: Pulmonary effort is normal. No respiratory distress.     Breath sounds: Normal breath sounds. No wheezing, rhonchi or rales.  Skin:    General: Skin is warm and dry.     Coloration:  Skin is not jaundiced.     Findings: No bruising.  Neurological:     Mental Status: She is alert and oriented to person, place, and time.     Motor: No weakness.     Gait: Gait normal.  Psychiatric:        Mood and Affect: Mood normal.        Behavior: Behavior normal.        Thought Content: Thought content normal.        Judgment: Judgment normal.    Results for orders placed or performed in visit on 02/22/21  QuantiFERON-TB Gold Plus  Result Value Ref Range   QuantiFERON-TB Gold Plus NEGATIVE NEGATIVE   NIL 0.02 IU/mL   Mitogen-NIL >10.00 IU/mL   TB1-NIL 0.00 IU/mL   TB2-NIL 0.01 IU/mL      Assessment & Plan:   Problem List Items Addressed This Visit      Cardiovascular and Mediastinum   Aortic valve sclerosis    Apparent on echo in 2014.  Patient does have murmur on examination-we will obtain repeat echo to reevaluate and monitor for any progression.  No signs or symptoms of decreased cardiac output or heart failure today.      Relevant Orders   ECHOCARDIOGRAM COMPLETE     Endocrine   Type 2 diabetes mellitus with neurological complications ([facility]) - Primary    Chronic.  Tolerating dulaglutide 0.75 mg once weekly well.  We will continue this medication for now and follow-up in 1 month for wellness exam.  Encouraged patient to schedule eye exam.      Relevant Orders   Basic Metabolic Panel   Hemoglobin A1c       Follow up plan: Return in about 1 month (around 04/22/2021) for wellness exam, virtual with me during lunch; 3 month (September) for chronic f/u.

## 2021-03-22 NOTE — Patient Instructions (Signed)
Nice seeing you today!  - Be sure to remember to your eye doctor to schedule a diabetic eye exam

## 2021-03-23 ENCOUNTER — Encounter: Payer: Self-pay | Admitting: Gastroenterology

## 2021-03-23 LAB — HEMOGLOBIN A1C
Hgb A1c MFr Bld: 6.4 % of total Hgb — ABNORMAL HIGH (ref <5.7)
Mean Plasma Glucose: 137 mg/dL
eAG (mmol/L): 7.6 mmol/L

## 2021-03-23 LAB — BASIC METABOLIC PANEL
BUN: 17 mg/dL (ref 7–25)
CO2: 26 mmol/L (ref 20–32)
Calcium: 9 mg/dL (ref 8.6–10.4)
Chloride: 103 mmol/L (ref 98–110)
Creat: 0.89 mg/dL (ref 0.60–0.93)
Glucose, Bld: 147 mg/dL — ABNORMAL HIGH (ref 65–99)
Potassium: 4.1 mmol/L (ref 3.5–5.3)
Sodium: 140 mmol/L (ref 135–146)

## 2021-03-28 ENCOUNTER — Other Ambulatory Visit: Payer: Self-pay | Admitting: *Deleted

## 2021-03-28 DIAGNOSIS — E1149 Type 2 diabetes mellitus with other diabetic neurological complication: Secondary | ICD-10-CM

## 2021-03-28 DIAGNOSIS — G894 Chronic pain syndrome: Secondary | ICD-10-CM | POA: Diagnosis not present

## 2021-03-28 MED ORDER — TRULICITY 0.75 MG/0.5ML ~~LOC~~ SOAJ: 0.7500 mg | SUBCUTANEOUS | 3 refills | Status: DC

## 2021-04-03 ENCOUNTER — Other Ambulatory Visit: Payer: Self-pay

## 2021-04-03 ENCOUNTER — Ambulatory Visit (HOSPITAL_COMMUNITY)
Admission: RE | Admit: 2021-04-03 | Discharge: 2021-04-03 | Disposition: A | Discharge status: Home / Self Care | Payer: Medicare Other | Source: Ambulatory Visit | Attending: Gastroenterology | Admitting: Gastroenterology

## 2021-04-03 DIAGNOSIS — R109 Unspecified abdominal pain: Secondary | ICD-10-CM | POA: Diagnosis not present

## 2021-04-03 DIAGNOSIS — R1013 Epigastric pain: Secondary | ICD-10-CM | POA: Insufficient documentation

## 2021-04-03 DIAGNOSIS — R112 Nausea with vomiting, unspecified: Secondary | ICD-10-CM | POA: Insufficient documentation

## 2021-04-03 DIAGNOSIS — K30 Functional dyspepsia: Secondary | ICD-10-CM | POA: Diagnosis not present

## 2021-04-03 MED ORDER — TECHNETIUM TC 99M SULFUR COLLOID
2.0000 | Freq: Once | INTRAVENOUS | Status: AC | PRN
  Administered 2021-04-03: 2 via INTRAVENOUS

## 2021-04-14 DIAGNOSIS — G894 Chronic pain syndrome: Secondary | ICD-10-CM | POA: Diagnosis not present

## 2021-04-25 DIAGNOSIS — G894 Chronic pain syndrome: Secondary | ICD-10-CM | POA: Diagnosis not present

## 2021-04-25 DIAGNOSIS — M5030 Other cervical disc degeneration, unspecified cervical region: Secondary | ICD-10-CM | POA: Diagnosis not present

## 2021-04-25 DIAGNOSIS — M179 Osteoarthritis of knee, unspecified: Secondary | ICD-10-CM | POA: Diagnosis not present

## 2021-04-25 DIAGNOSIS — M5136 Other intervertebral disc degeneration, lumbar region: Secondary | ICD-10-CM | POA: Diagnosis not present

## 2021-04-26 ENCOUNTER — Ambulatory Visit: Payer: BC Managed Care – PPO | Admitting: Nurse Practitioner

## 2021-04-30 ENCOUNTER — Ambulatory Visit
Admission: RE | Admit: 2021-04-30 | Discharge: 2021-04-30 | Disposition: A | Discharge status: Home / Self Care | Payer: Medicare Other | Source: Ambulatory Visit | Attending: Nurse Practitioner | Admitting: Nurse Practitioner

## 2021-04-30 ENCOUNTER — Other Ambulatory Visit: Payer: Self-pay

## 2021-04-30 DIAGNOSIS — J449 Chronic obstructive pulmonary disease, unspecified: Secondary | ICD-10-CM | POA: Diagnosis not present

## 2021-04-30 DIAGNOSIS — R011 Cardiac murmur, unspecified: Secondary | ICD-10-CM | POA: Diagnosis not present

## 2021-04-30 DIAGNOSIS — I358 Other nonrheumatic aortic valve disorders: Secondary | ICD-10-CM | POA: Diagnosis not present

## 2021-04-30 DIAGNOSIS — E119 Type 2 diabetes mellitus without complications: Secondary | ICD-10-CM | POA: Insufficient documentation

## 2021-04-30 NOTE — Progress Notes (Signed)
*  PRELIMINARY RESULTS* Echocardiogram 2D Echocardiogram has been performed.  Abigail [name] 04/30/2021, 11:37 AM

## 2021-05-01 LAB — ECHOCARDIOGRAM COMPLETE
AR max vel: 0.93 cm2
AV Area VTI: 1.15 cm2
AV Area mean vel: 1.04 cm2
AV Mean grad: 23.7 mmHg
AV Peak grad: 42.9 mmHg
Ao pk vel: 3.27 m/s
Area-P 1/2: 2.55 cm2
MV VTI: 1.79 cm2
S' Lateral: 2 cm

## 2021-05-14 DIAGNOSIS — G894 Chronic pain syndrome: Secondary | ICD-10-CM | POA: Diagnosis not present

## 2021-05-23 DIAGNOSIS — G894 Chronic pain syndrome: Secondary | ICD-10-CM | POA: Diagnosis not present

## 2021-05-23 DIAGNOSIS — M5136 Other intervertebral disc degeneration, lumbar region: Secondary | ICD-10-CM | POA: Diagnosis not present

## 2021-05-23 DIAGNOSIS — M1990 Unspecified osteoarthritis, unspecified site: Secondary | ICD-10-CM | POA: Diagnosis not present

## 2021-05-23 DIAGNOSIS — M179 Osteoarthritis of knee, unspecified: Secondary | ICD-10-CM | POA: Diagnosis not present

## 2021-05-29 ENCOUNTER — Ambulatory Visit: Payer: Medicare Other | Admitting: Physician Assistant

## 2021-06-01 ENCOUNTER — Encounter: Payer: Self-pay | Admitting: Nurse Practitioner

## 2021-06-01 ENCOUNTER — Ambulatory Visit (INDEPENDENT_AMBULATORY_CARE_PROVIDER_SITE_OTHER): Payer: Medicare Other | Admitting: Nurse Practitioner

## 2021-06-01 ENCOUNTER — Other Ambulatory Visit: Payer: Self-pay

## 2021-06-01 VITALS — BP 132/74 | HR 80 | Temp 98.7°F | Ht 64.0 in | Wt 190.4 lb

## 2021-06-01 DIAGNOSIS — K200 Eosinophilic esophagitis: Secondary | ICD-10-CM | POA: Diagnosis not present

## 2021-06-01 DIAGNOSIS — J0140 Acute pansinusitis, unspecified: Secondary | ICD-10-CM | POA: Insufficient documentation

## 2021-06-01 DIAGNOSIS — J42 Unspecified chronic bronchitis: Secondary | ICD-10-CM

## 2021-06-01 MED ORDER — FLUCONAZOLE 100 MG PO TABS: 100.0000 mg | ORAL_TABLET | Freq: Every day | ORAL | 0 refills | Status: DC

## 2021-06-01 MED ORDER — ALBUTEROL SULFATE HFA 108 (90 BASE) MCG/ACT IN AERS: 2.0000 | INHALATION_SPRAY | RESPIRATORY_TRACT | 1 refills | Status: DC | PRN

## 2021-06-01 MED ORDER — AMOXICILLIN-POT CLAVULANATE 875-125 MG PO TABS: 1.0000 | ORAL_TABLET | Freq: Two times a day (BID) | ORAL | 0 refills | Status: DC

## 2021-06-01 MED ORDER — AMOXICILLIN-POT CLAVULANATE 875-125 MG PO TABS: 1.0000 | ORAL_TABLET | Freq: Two times a day (BID) | ORAL | 0 refills | Status: AC

## 2021-06-01 NOTE — Progress Notes (Signed)
Subjective:    Patient ID: Abigail [name], female    DOB: July 01, 1951, 70 y.o.   MRN: [id number]  HPI: Abigail [name] is a 70 y.o. female presenting for sinus issues/ear pain  Chief Complaint  Patient presents with   Allergies    Yrly occurrence, blood when blowing nose and ear feels like they have fluid in them, using flonase   SINUS INFECTION Onset: 4 weeks ago Fever: no Cough: yes; dark mucus Shortness of breath:  yes; worse with walking around Wheezing: no Chest pain: no Chest tightness: no Chest congestion: no Nasal congestion: yes Runny nose: yes Post nasal drip: yes Sneezing: no Sore throat: yes Swollen glands: no Sinus pressure: yes Headache: no Face pain: no Toothache: no Ear pain: no  Ear pressure: no  Eyes red/itching:no Eye drainage/crusting: no  Nausea: no  Vomiting: no Diarrhea: no  Change in appetite: yes; decreased  Loss of taste/smell: yes  Rash: no Fatigue: yes Sick contacts: no Strep contacts: no  Context: stable Recurrent sinusitis: no Treatments attempted: flonase, sinex, Zyrtec Relief with OTC medications: somewhat  Allergies  Allergen Reactions   Cinnamon     Dry throat    Statins     Outpatient Encounter Medications as of 06/01/2021  Medication Sig Note   clobetasol (TEMOVATE) 0.05 % external solution Apply 1 application topically 2 (two) times daily.    COSENTYX SENSOREADY, 300 MG, 150 MG/ML SOAJ INJECT TWO PENS SUBCUTANEOUSLY EVERY 4 WEEKS. REFRIGERATE. ALLOW 15 TO 30 MINUTES AT ROOM TEMP PRIOR TO ADMINISTRATION.    diclofenac (FLECTOR) 1.3 % PTCH SMARTSIG:Topical    Dulaglutide (TRULICITY) 9.82 ME/1.5AX SOPN Inject 0.75 mg into the skin once a week.    DULoxetine (CYMBALTA) 30 MG capsule take 1 capsule by mouth once daily (IF TOLERATED)    fluticasone (FLOVENT HFA) 220 MCG/ACT inhaler 2 puffs swallowed twice daily-do not eat or drink for 30 minutes after    gabapentin (NEURONTIN) 300 MG capsule TAKE 3 CAPSULES BY MOUTH THREE  TIMES DAILY (Patient taking differently: 4 caps in the morning and 4 in the evening per patient)    glipiZIDE (GLUCOTROL XL) 5 MG 24 hr tablet Take 1 tablet (5 mg total) by mouth daily with breakfast.    glucose blood (FREESTYLE LITE) test strip Use as instructed to monitor FSBS 1x daily. Dx: E11.9    glucose monitoring kit (FREESTYLE) monitoring kit 1 each by Does not apply route as needed for other. Dispense one glucometer of choice, testing strips/lancets for once per day glucose checks.  QS for 1 month, 11 refills.    Lancets (FREESTYLE) lancets Use as instructed    linaclotide (LINZESS) 145 MCG CAPS capsule Take 1 capsule (145 mcg total) by mouth daily before breakfast.    lisinopril (ZESTRIL) 20 MG tablet Take 1 tablet (20 mg total) by mouth daily.    omeprazole (PRILOSEC) 40 MG capsule Take 1 capsule (40 mg total) by mouth 2 (two) times daily.    oxyCODONE (OXY IR/ROXICODONE) 5 MG immediate release tablet TK 1/2 TO 1 TABLET BY MOUTH 2 TO 3 TIMES PER DAY IF TOLERATED. NO NUCYNTA 11/09/2018: Pt taking 1 tablet per day   [DISCONTINUED] amoxicillin-clavulanate (AUGMENTIN) 875-125 MG tablet Take 1 tablet by mouth 2 (two) times daily for 7 days.    [DISCONTINUED] fluconazole (DIFLUCAN) 100 MG tablet Take 1 tablet (100 mg total) by mouth daily. Take daily for 3 days    albuterol (VENTOLIN HFA) 108 (90 Base) MCG/ACT inhaler Inhale  2 puffs into the lungs every 4 (four) hours as needed for wheezing or shortness of breath.    amoxicillin-clavulanate (AUGMENTIN) 875-125 MG tablet Take 1 tablet by mouth 2 (two) times daily for 10 days.    fluconazole (DIFLUCAN) 100 MG tablet Take 1 tablet (100 mg total) by mouth daily. Take daily for 3 days    No facility-administered encounter medications on file as of 06/01/2021.    Patient Active Problem List   Diagnosis Date Noted   Eosinophilic esophagitis 19/50/9326   Acute non-recurrent pansinusitis 06/01/2021   Dysphagia 09/15/2019   Upper airway cough syndrome  02/12/2018   Bronchiectasis without complication ([place]) 71/24/5809   Type 2 diabetes mellitus with neurological complications ([place]) 98/33/8250   Diabetic neuropathy ([place]) 04/02/2017   COPD (chronic obstructive pulmonary disease) ([place]) 04/02/2017   Aortic valve sclerosis 04/02/2017   Pulmonary nodule 05/21/2013   IRRITABLE BOWEL SYNDROME 05/18/2008   Hyperlipidemia 05/17/2008   Essential hypertension 05/17/2008   HEMORRHOIDS 05/17/2008   GERD 05/17/2008   FATTY LIVER DISEASE 05/17/2008   PSORIASIS 05/17/2008   Osteoarthritis 05/17/2008   STRESS INCONTINENCE 05/17/2008    Past Medical History:  Diagnosis Date   Allergy    Anxiety    Arthritis    hands, knees   Cancer ([place])    Cataract    surgery to remove -bilateral   COPD (chronic obstructive pulmonary disease) ([place])    Depression    Diabetes mellitus ([place])    a. A1c 6.6 in 05/2013 indicating new dx.   Fatty liver    Female stress incontinence    GERD (gastroesophageal reflux disease)    Heart murmur    no problems   Hemorrhoids    History of kidney stones    surgery to remove   Hyperlipidemia    Hyperplastic colon polyp 06/07/2008   Hypertension    Hypertriglyceridemia    IBS (irritable bowel syndrome)    Lung nodule    a. 98m left lung nodule by CT 05/2013.   Neuromuscular disorder (HCC)    neuropathy feet   Obesity    Psoriasis    Skin cancer     Relevant past medical, surgical, family and social history reviewed and updated as indicated. Interim medical history since our last visit reviewed.  Review of Systems Per HPI unless specifically indicated above     Objective:    BP 132/74   Pulse 80   Temp 98.7 F (37.1 C)   Ht 5' 4"  (1.626 m)   Wt 190 lb 6.4 oz (86.4 kg)   SpO2 96%   BMI 32.68 kg/m   Wt Readings from Last 3 Encounters:  06/01/21 190 lb 6.4 oz (86.4 kg)  03/22/21 184 lb (83.5 kg)  03/06/21 180 lb (81.6 kg)    Physical Exam Vitals and nursing note reviewed.  Constitutional:       General: She is not in acute distress.    Appearance: Normal appearance. She is not toxic-appearing.  HENT:     Head: Normocephalic and atraumatic.     Right Ear: Decreased hearing noted. A middle ear effusion is present. There is no impacted cerumen. Tympanic membrane is scarred and erythematous.     Left Ear: Decreased hearing noted. A middle ear effusion is present. There is no impacted cerumen. Tympanic membrane is scarred and erythematous.     Nose: Congestion present.     Right Turbinates: Swollen.     Left Turbinates: Swollen.     Right Sinus: Maxillary  sinus tenderness and frontal sinus tenderness present.     Left Sinus: Maxillary sinus tenderness and frontal sinus tenderness present.     Mouth/Throat:     Mouth: Mucous membranes are moist.     Pharynx: Oropharynx is clear. No oropharyngeal exudate or posterior oropharyngeal erythema.  Eyes:     General: No scleral icterus.    Extraocular Movements: Extraocular movements intact.  Cardiovascular:     Rate and Rhythm: Normal rate and regular rhythm.     Heart sounds: Murmur heard.  Pulmonary:     Effort: Pulmonary effort is normal. No respiratory distress.     Breath sounds: Normal breath sounds. No wheezing, rhonchi or rales.  Musculoskeletal:     Cervical back: Normal range of motion and neck supple.  Lymphadenopathy:     Cervical: No cervical adenopathy.  Skin:    General: Skin is warm and dry.     Capillary Refill: Capillary refill takes less than 2 seconds.     Coloration: Skin is not jaundiced or pale.     Findings: No erythema.  Neurological:     Mental Status: She is alert and oriented to person, place, and time.     Motor: No weakness.     Gait: Gait normal.  Psychiatric:        Mood and Affect: Mood normal.        Behavior: Behavior normal.        Thought Content: Thought content normal.        Judgment: Judgment normal.      Assessment & Plan:   Problem List Items Addressed This Visit        Respiratory   COPD (chronic obstructive pulmonary disease) ([place]) - Primary    Chronic.  Will send in refill for rescue inhaler to have on hand and use if feeling short of breath or wheezing.  No wheezing heard on examination today.  Educated patient to reach out to [place] if using inhaler more than a couple of times weekly.       Relevant Medications   albuterol (VENTOLIN HFA) 108 (90 Base) MCG/ACT inhaler   Acute non-recurrent pansinusitis    Acute, ongoing.  Given length and severity of symptoms, I think the patient has both a bacterial sinus infection and bilateral ear infection.  I will treat with Augmentin twice daily.  Patient to return to clinic if symptoms persist or do not improve.  Going to hold off on prednisone for now with history of diabetes.  With any sudden onset new chest pain, dizziness, sweating, or shortness of breath, go to ED.        Relevant Medications   fluconazole (DIFLUCAN) 100 MG tablet   amoxicillin-clavulanate (AUGMENTIN) 875-125 MG tablet     Digestive   Eosinophilic esophagitis    With recurrent yeast infections after antibiotic use, refill of fluconazole sent into pharmacy.       Relevant Medications   fluconazole (DIFLUCAN) 100 MG tablet     Follow up plan: Return in about 4 weeks (around 06/29/2021) for annual wellness visit .

## 2021-06-01 NOTE — Assessment & Plan Note (Signed)
With recurrent yeast infections after antibiotic use, refill of fluconazole sent into pharmacy.

## 2021-06-01 NOTE — Assessment & Plan Note (Signed)
Chronic.  Will send in refill for rescue inhaler to have on hand and use if feeling short of breath or wheezing.  No wheezing heard on examination today.  Educated patient to reach out to [place] if using inhaler more than a couple of times weekly.

## 2021-06-01 NOTE — Assessment & Plan Note (Signed)
Acute, ongoing.  Given length and severity of symptoms, I think the patient has both a bacterial sinus infection and bilateral ear infection.  I will treat with Augmentin twice daily.  Patient to return to clinic if symptoms persist or do not improve.  Going to hold off on prednisone for now with history of diabetes.  With any sudden onset new chest pain, dizziness, sweating, or shortness of breath, go to ED.

## 2021-06-13 DIAGNOSIS — G894 Chronic pain syndrome: Secondary | ICD-10-CM | POA: Diagnosis not present

## 2021-06-19 ENCOUNTER — Other Ambulatory Visit: Payer: Self-pay | Admitting: Dermatology

## 2021-06-20 DIAGNOSIS — M1990 Unspecified osteoarthritis, unspecified site: Secondary | ICD-10-CM | POA: Diagnosis not present

## 2021-06-20 DIAGNOSIS — G894 Chronic pain syndrome: Secondary | ICD-10-CM | POA: Diagnosis not present

## 2021-06-20 DIAGNOSIS — M179 Osteoarthritis of knee, unspecified: Secondary | ICD-10-CM | POA: Diagnosis not present

## 2021-06-20 DIAGNOSIS — M5136 Other intervertebral disc degeneration, lumbar region: Secondary | ICD-10-CM | POA: Diagnosis not present

## 2021-07-02 ENCOUNTER — Encounter: Payer: Self-pay | Admitting: Nurse Practitioner

## 2021-07-02 ENCOUNTER — Ambulatory Visit (INDEPENDENT_AMBULATORY_CARE_PROVIDER_SITE_OTHER): Payer: Medicare Other | Admitting: Nurse Practitioner

## 2021-07-02 ENCOUNTER — Other Ambulatory Visit: Payer: Self-pay

## 2021-07-02 VITALS — BP 130/72 | HR 76 | Temp 98.7°F | Resp 16 | Ht 64.0 in | Wt 193.0 lb

## 2021-07-02 DIAGNOSIS — L408 Other psoriasis: Secondary | ICD-10-CM | POA: Diagnosis not present

## 2021-07-02 DIAGNOSIS — E1149 Type 2 diabetes mellitus with other diabetic neurological complication: Secondary | ICD-10-CM | POA: Diagnosis not present

## 2021-07-02 DIAGNOSIS — I10 Essential (primary) hypertension: Secondary | ICD-10-CM | POA: Diagnosis not present

## 2021-07-02 DIAGNOSIS — H6120 Impacted cerumen, unspecified ear: Secondary | ICD-10-CM

## 2021-07-02 DIAGNOSIS — K200 Eosinophilic esophagitis: Secondary | ICD-10-CM | POA: Diagnosis not present

## 2021-07-02 DIAGNOSIS — E782 Mixed hyperlipidemia: Secondary | ICD-10-CM

## 2021-07-02 NOTE — Assessment & Plan Note (Signed)
Chronic.  Continue dulaglutide 0.75 mg weekly and glipizide 5 mg daily for now.  Check A1c today and increase on dulaglutide as indicated.  Encouraged eye exam.  She has been intolerant of statins in the past.  We will check lipids today-patient is fasting.  Consider Zetia to help reduce cardiovascular risk if indicated.  LDL goal less than 70.

## 2021-07-02 NOTE — Assessment & Plan Note (Signed)
Chronic.  Blood pressures well controlled today in clinic.  Continue lisinopril 20 mg daily.  We will check kidney function with electrolytes today.  Follow-up in 3 months.

## 2021-07-02 NOTE — Assessment & Plan Note (Signed)
Chronic.  Maintained on Prilosec 40 mg twice daily and Flovent oral.  Follows closely with GI-continue collaboration.  Follow-up 6 months.

## 2021-07-02 NOTE — Assessment & Plan Note (Signed)
Chronic.  Intolerant to statins.  We will check lipids today-patient is fasting.  Consider Zetia if LDL above goal, fenofibrate if triglycerides elevated.  Follow-up pending blood work.

## 2021-07-02 NOTE — Progress Notes (Signed)
Subjective:    Patient ID: Abigail [name], female    DOB: [birth year], 70 y.o.   MRN: [id number]  HPI: Abigail [name] is a 70 y.o. female presenting for follow up.  Chief Complaint  Patient presents with   Follow-up    Trulicity- fatigue noted   Ear Pressure    L ear pressure and hearing decreased   EAG CLOGGED Duration: months Involved ear(s): left Sensation of feeling clogged/plugged: yes Decreased/muffled hearing:yes Ear pain: no Fever: no Otorrhea: yes; brown Hearing loss: no Upper respiratory infection symptoms: no Using Q-Tips: no Status: stable History of cerumenosis: no Treatments attempted:  steroid ointment, drops at home  DIABETES Currently taking glipizide 5 mg daily, Trulicity 8.14 mg weekly.  Also taking gabapentin for neuropathic pain. Hypoglycemic episodes:no Polydipsia/polyuria: no Visual disturbance: no Chest pain: no Paresthesias: yes in toes; take gabapenin  Glucose Monitoring: yes  Accucheck frequency: less than 120 fasting Taking Insulin?: no Blood Pressure Monitoring: daily Normal BP: 130s/80s Retinal Examination: Not up to Date Foot Exam: Up to Date Diabetic Education: Completed Pneumovax: up to date Influenza:Not up to Date Aspirin: no  Also has a history of psoriasis-follows with dermatology for this.  Follows with gastroenterology for eosinophilic esophagitis-recently started on Flovent.  Allergies  Allergen Reactions   Cinnamon     Dry throat    Statins     Outpatient Encounter Medications as of 07/02/2021  Medication Sig Note   albuterol (VENTOLIN HFA) 108 (90 Base) MCG/ACT inhaler Inhale 2 puffs into the lungs every 4 (four) hours as needed for wheezing or shortness of breath.    clobetasol (TEMOVATE) 0.05 % external solution Apply 1 application topically 2 (two) times daily.    COSENTYX SENSOREADY, 300 MG, 150 MG/ML SOAJ INJECT TWO PENS SUBCUTANEOUSLY EVERY 4 WEEKS. REFRIGERATE. ALLOW 15 TO 30 MINUTES AT ROOM TEMP PRIOR TO  ADMINISTRATION.    diclofenac (FLECTOR) 1.3 % PTCH SMARTSIG:Topical    Dulaglutide (TRULICITY) 4.81 EH/6.3JS SOPN Inject 0.75 mg into the skin once a week.    DULoxetine (CYMBALTA) 30 MG capsule take 1 capsule by mouth once daily (IF TOLERATED)    fluticasone (FLOVENT HFA) 220 MCG/ACT inhaler 2 puffs swallowed twice daily-do not eat or drink for 30 minutes after    gabapentin (NEURONTIN) 300 MG capsule TAKE 3 CAPSULES BY MOUTH THREE TIMES DAILY (Patient taking differently: 4 caps in the morning and 4 in the evening per patient)    glipiZIDE (GLUCOTROL XL) 5 MG 24 hr tablet Take 1 tablet (5 mg total) by mouth daily with breakfast.    glucose blood (FREESTYLE LITE) test strip Use as instructed to monitor FSBS 1x daily. Dx: E11.9    glucose monitoring kit (FREESTYLE) monitoring kit 1 each by Does not apply route as needed for other. Dispense one glucometer of choice, testing strips/lancets for once per day glucose checks.  QS for 1 month, 11 refills.    Lancets (FREESTYLE) lancets Use as instructed    lisinopril (ZESTRIL) 20 MG tablet Take 1 tablet (20 mg total) by mouth daily.    omeprazole (PRILOSEC) 40 MG capsule Take 1 capsule (40 mg total) by mouth 2 (two) times daily.    oxyCODONE (OXY IR/ROXICODONE) 5 MG immediate release tablet TK 1/2 TO 1 TABLET BY MOUTH 2 TO 3 TIMES PER DAY IF TOLERATED. NO NUCYNTA 11/09/2018: Pt taking 1 tablet per day   [DISCONTINUED] fluconazole (DIFLUCAN) 100 MG tablet Take 1 tablet (100 mg total) by mouth daily. Take daily  for 3 days    [DISCONTINUED] linaclotide (LINZESS) 145 MCG CAPS capsule Take 1 capsule (145 mcg total) by mouth daily before breakfast.    No facility-administered encounter medications on file as of 07/02/2021.    Patient Active Problem List   Diagnosis Date Noted   Eosinophilic esophagitis 23/34/3568   Acute non-recurrent pansinusitis 06/01/2021   Dysphagia 09/15/2019   Upper airway cough syndrome 02/12/2018   Bronchiectasis without complication  ([place]) 61/68/3729   Type 2 diabetes mellitus with neurological complications ([place]) 12/15/1550   Diabetic neuropathy ([place]) 04/02/2017   COPD (chronic obstructive pulmonary disease) ([place]) 04/02/2017   Aortic valve sclerosis 04/02/2017   Pulmonary nodule 05/21/2013   IRRITABLE BOWEL SYNDROME 05/18/2008   Hyperlipidemia 05/17/2008   Essential hypertension 05/17/2008   HEMORRHOIDS 05/17/2008   GERD 05/17/2008   FATTY LIVER DISEASE 05/17/2008   PSORIASIS 05/17/2008   Osteoarthritis 05/17/2008   STRESS INCONTINENCE 05/17/2008    Past Medical History:  Diagnosis Date   Allergy    Anxiety    Arthritis    hands, knees   Cancer ([place])    Cataract    surgery to remove -bilateral   COPD (chronic obstructive pulmonary disease) ([place])    Depression    Diabetes mellitus ([place])    a. A1c 6.6 in 05/2013 indicating new dx.   Fatty liver    Female stress incontinence    GERD (gastroesophageal reflux disease)    Heart murmur    no problems   Hemorrhoids    History of kidney stones    surgery to remove   Hyperlipidemia    Hyperplastic colon polyp 06/07/2008   Hypertension    Hypertriglyceridemia    IBS (irritable bowel syndrome)    Lung nodule    a. 67m left lung nodule by CT 05/2013.   Neuromuscular disorder (HCC)    neuropathy feet   Obesity    Psoriasis    Skin cancer     Relevant past medical, surgical, family and social history reviewed and updated as indicated. Interim medical history since our last visit reviewed.  Review of Systems Per HPI unless specifically indicated above     Objective:    BP 130/72   Pulse 76   Temp 98.7 F (37.1 C) (Temporal)   Resp 16   Ht 5' 4"  (1.626 m)   Wt 193 lb (87.5 kg)   SpO2 93%   BMI 33.13 kg/m   Wt Readings from Last 3 Encounters:  07/02/21 193 lb (87.5 kg)  06/01/21 190 lb 6.4 oz (86.4 kg)  03/22/21 184 lb (83.5 kg)    Physical Exam Vitals and nursing note reviewed.  Constitutional:      General: She is not in acute  distress.    Appearance: Normal appearance. She is obese. She is not toxic-appearing.  HENT:     Right Ear: Tympanic membrane and ear canal normal. No decreased hearing noted.     Left Ear: Decreased hearing noted. There is impacted cerumen.     Ears:     Comments: External auditory canal is edematous bilaterally, no drainage or middle ear effusion noted.  Left tympanic membrane is obstructed by cerumen.    Nose: Nose normal. No congestion.     Mouth/Throat:     Mouth: Mucous membranes are moist.     Pharynx: Oropharynx is clear.  Eyes:     General: No scleral icterus.    Extraocular Movements: Extraocular movements intact.  Cardiovascular:     Rate and Rhythm:  Normal rate and regular rhythm.     Heart sounds: Murmur heard.  Pulmonary:     Effort: Pulmonary effort is normal. No respiratory distress.     Breath sounds: Normal breath sounds. No wheezing, rhonchi or rales.  Abdominal:     General: Abdomen is flat. Bowel sounds are normal.     Palpations: Abdomen is soft.  Skin:    General: Skin is warm and dry.     Capillary Refill: Capillary refill takes less than 2 seconds.     Coloration: Skin is not jaundiced.     Findings: No bruising.  Neurological:     Mental Status: She is alert and oriented to person, place, and time.     Motor: No weakness.     Gait: Gait normal.  Psychiatric:        Mood and Affect: Mood normal.        Behavior: Behavior normal.        Thought Content: Thought content normal.        Judgment: Judgment normal.      Assessment & Plan:   Problem List Items Addressed This Visit       Cardiovascular and Mediastinum   Essential hypertension - Primary    Chronic.  Blood pressures well controlled today in clinic.  Continue lisinopril 20 mg daily.  We will check kidney function with electrolytes today.  Follow-up in 3 months.      Relevant Orders   COMPLETE METABOLIC PANEL WITH GFR   CBC with Differential     Digestive   Eosinophilic esophagitis     Chronic.  Maintained on Prilosec 40 mg twice daily and Flovent oral.  Follows closely with GI-continue collaboration.  Follow-up 6 months.      Relevant Orders   Magnesium     Endocrine   Type 2 diabetes mellitus with neurological complications (HCC)    Chronic.  Continue dulaglutide 0.75 mg weekly and glipizide 5 mg daily for now.  Check A1c today and increase on dulaglutide as indicated.  Encouraged eye exam.  She has been intolerant of statins in the past.  We will check lipids today-patient is fasting.  Consider Zetia to help reduce cardiovascular risk if indicated.  LDL goal less than 70.      Relevant Orders   Hemoglobin A1c     Musculoskeletal and Integument   PSORIASIS    Chronic.  Follows with dermatology-continue collaboration.        Other   Hyperlipidemia    Chronic.  Intolerant to statins.  We will check lipids today-patient is fasting.  Consider Zetia if LDL above goal, fenofibrate if triglycerides elevated.  Follow-up pending blood work.      Relevant Orders   Lipid panel   Other Visit Diagnoses     Cerumen in auditory canal on examination       Coupled with edema, cerumen impaction, muffled hearing, will refer to ENT for further evaluation.  Urine today without successful removal of cerumen.   Relevant Orders   Ambulatory referral to ENT        Follow up plan: No follow-ups on file.

## 2021-07-02 NOTE — Assessment & Plan Note (Signed)
Chronic.  Follows with dermatology-continue collaboration.

## 2021-07-03 LAB — MAGNESIUM: Magnesium: 1.9 mg/dL (ref 1.5–2.5)

## 2021-07-03 LAB — COMPLETE METABOLIC PANEL WITH GFR
AG Ratio: 1.6 (calc) (ref 1.0–2.5)
ALT: 19 U/L (ref 6–29)
AST: 21 U/L (ref 10–35)
Albumin: 4 g/dL (ref 3.6–5.1)
Alkaline phosphatase (APISO): 69 U/L (ref 37–153)
BUN: 10 mg/dL (ref 7–25)
CO2: 27 mmol/L (ref 20–32)
Calcium: 9.2 mg/dL (ref 8.6–10.4)
Chloride: 107 mmol/L (ref 98–110)
Creat: 0.67 mg/dL (ref 0.60–1.00)
Globulin: 2.5 g/dL (calc) (ref 1.9–3.7)
Glucose, Bld: 102 mg/dL — ABNORMAL HIGH (ref 65–99)
Potassium: 3.9 mmol/L (ref 3.5–5.3)
Sodium: 143 mmol/L (ref 135–146)
Total Bilirubin: 0.6 mg/dL (ref 0.2–1.2)
Total Protein: 6.5 g/dL (ref 6.1–8.1)
eGFR: 93 mL/min/{1.73_m2} (ref >=60)

## 2021-07-03 LAB — CBC WITH DIFFERENTIAL/PLATELET
Absolute Monocytes: 490 cells/uL (ref 200–950)
Basophils Absolute: 21 cells/uL (ref 0–200)
Basophils Relative: 0.3 %
Eosinophils Absolute: 221 cells/uL (ref 15–500)
Eosinophils Relative: 3.2 %
HCT: 40.8 % (ref 35.0–45.0)
Hemoglobin: 13.8 g/dL (ref 11.7–15.5)
Lymphs Abs: 2194 cells/uL (ref 850–3900)
MCH: 29.4 pg (ref 27.0–33.0)
MCHC: 33.8 g/dL (ref 32.0–36.0)
MCV: 86.8 fL (ref 80.0–100.0)
MPV: 11.2 fL (ref 7.5–12.5)
Monocytes Relative: 7.1 %
Neutro Abs: 3974 cells/uL (ref 1500–7800)
Neutrophils Relative %: 57.6 %
Platelets: 189 10*3/uL (ref 140–400)
RBC: 4.7 10*6/uL (ref 3.80–5.10)
RDW: 13.2 % (ref 11.0–15.0)
Total Lymphocyte: 31.8 %
WBC: 6.9 10*3/uL (ref 3.8–10.8)

## 2021-07-03 LAB — LIPID PANEL
Cholesterol: 247 mg/dL — ABNORMAL HIGH (ref <200)
HDL: 41 mg/dL — ABNORMAL LOW (ref >=50)
LDL Cholesterol (Calc): 168 mg/dL (calc) — ABNORMAL HIGH
Non-HDL Cholesterol (Calc): 206 mg/dL (calc) — ABNORMAL HIGH (ref <130)
Total CHOL/HDL Ratio: 6 (calc) — ABNORMAL HIGH (ref <5.0)
Triglycerides: 222 mg/dL — ABNORMAL HIGH (ref <150)

## 2021-07-03 LAB — HEMOGLOBIN A1C
Hgb A1c MFr Bld: 6 % of total Hgb — ABNORMAL HIGH (ref <5.7)
Mean Plasma Glucose: 126 mg/dL
eAG (mmol/L): 7 mmol/L

## 2021-07-04 ENCOUNTER — Other Ambulatory Visit: Payer: Self-pay

## 2021-07-04 ENCOUNTER — Encounter: Payer: Self-pay | Admitting: Podiatry

## 2021-07-04 ENCOUNTER — Ambulatory Visit (INDEPENDENT_AMBULATORY_CARE_PROVIDER_SITE_OTHER): Payer: Medicare Other | Admitting: Podiatry

## 2021-07-04 DIAGNOSIS — M778 Other enthesopathies, not elsewhere classified: Secondary | ICD-10-CM | POA: Diagnosis not present

## 2021-07-04 MED ORDER — TRIAMCINOLONE ACETONIDE 40 MG/ML IJ SUSP
40.0000 mg | Freq: Once | INTRAMUSCULAR | Status: AC
  Administered 2021-07-04: 40 mg

## 2021-07-04 NOTE — Progress Notes (Signed)
She presents today chief complaint of pain across the dorsal aspect of the bilateral foot.  She states that the feet are back hurting again the majority of the pain is across the dorsum of the foot.  Objective: Vital signs are stable she is alert and oriented x3 pulses are palpable.  She has palpable dorsal spurring which is painful on palpation in frontal plane range of motion.  Assessment: Osteoarthritis capsulitis with neuritis dorsal aspect of bilateral foot.  Plan: Injected the bilateral foot dorsally 20 mg Kenalog 5 mg Marcaine.

## 2021-07-11 ENCOUNTER — Encounter: Payer: Self-pay | Admitting: *Deleted

## 2021-07-11 ENCOUNTER — Other Ambulatory Visit: Payer: Self-pay | Admitting: *Deleted

## 2021-07-11 MED ORDER — EZETIMIBE 10 MG PO TABS: 10.0000 mg | ORAL_TABLET | Freq: Every day | ORAL | 3 refills | Status: DC

## 2021-07-13 DIAGNOSIS — M545 Low back pain, unspecified: Secondary | ICD-10-CM | POA: Diagnosis not present

## 2021-07-13 DIAGNOSIS — G894 Chronic pain syndrome: Secondary | ICD-10-CM | POA: Diagnosis not present

## 2021-07-14 ENCOUNTER — Other Ambulatory Visit: Payer: Self-pay | Admitting: Nurse Practitioner

## 2021-07-14 DIAGNOSIS — E1149 Type 2 diabetes mellitus with other diabetic neurological complication: Secondary | ICD-10-CM

## 2021-07-18 IMAGING — MG MM DIGITAL SCREENING BILAT W/ TOMO AND CAD
6 of 12 series · 6 of 36 positions shown · non-contrast
Comparison: Previous exam(s).

CLINICAL DATA: Screening.

EXAM:
DIGITAL SCREENING BILATERAL MAMMOGRAM WITH TOMOSYNTHESIS AND CAD
TECHNIQUE: Bilateral screening digital craniocaudal and mediolateral oblique
mammograms were obtained. Bilateral screening digital breast
tomosynthesis was performed. The images were evaluated with
computer-aided detection.

[R CC synth-2D (1 of 2)]
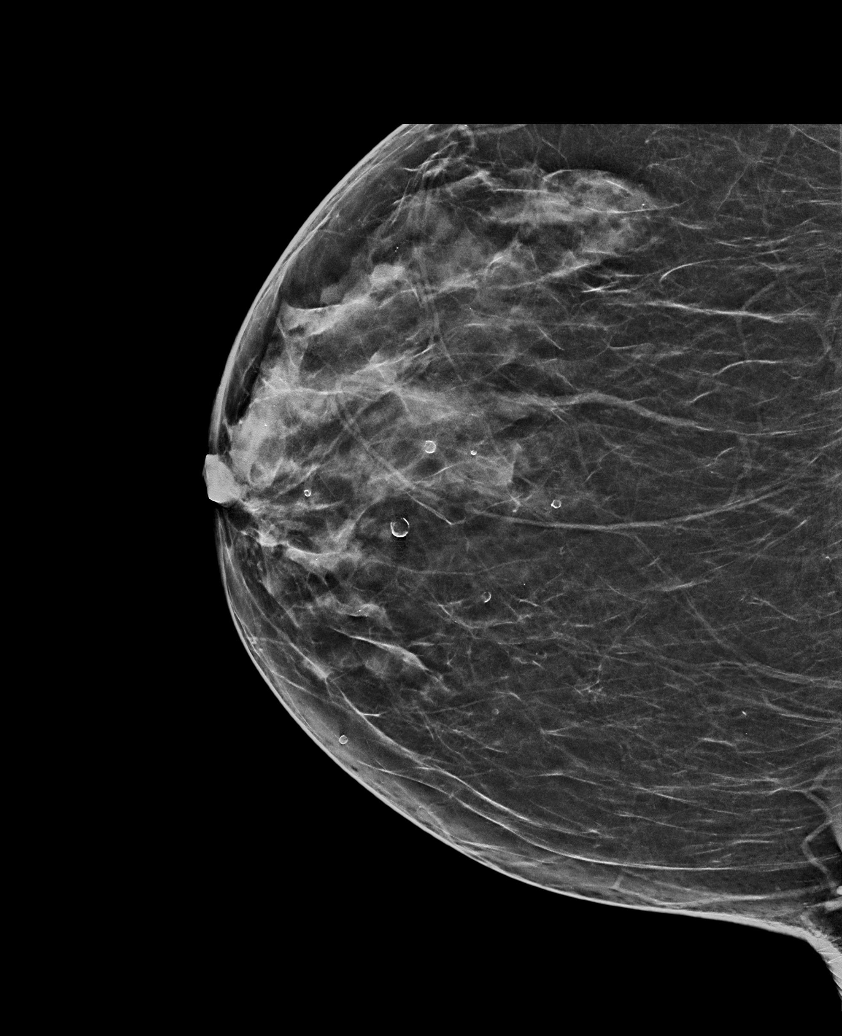

[L CC synth-2D (1 of 2)]
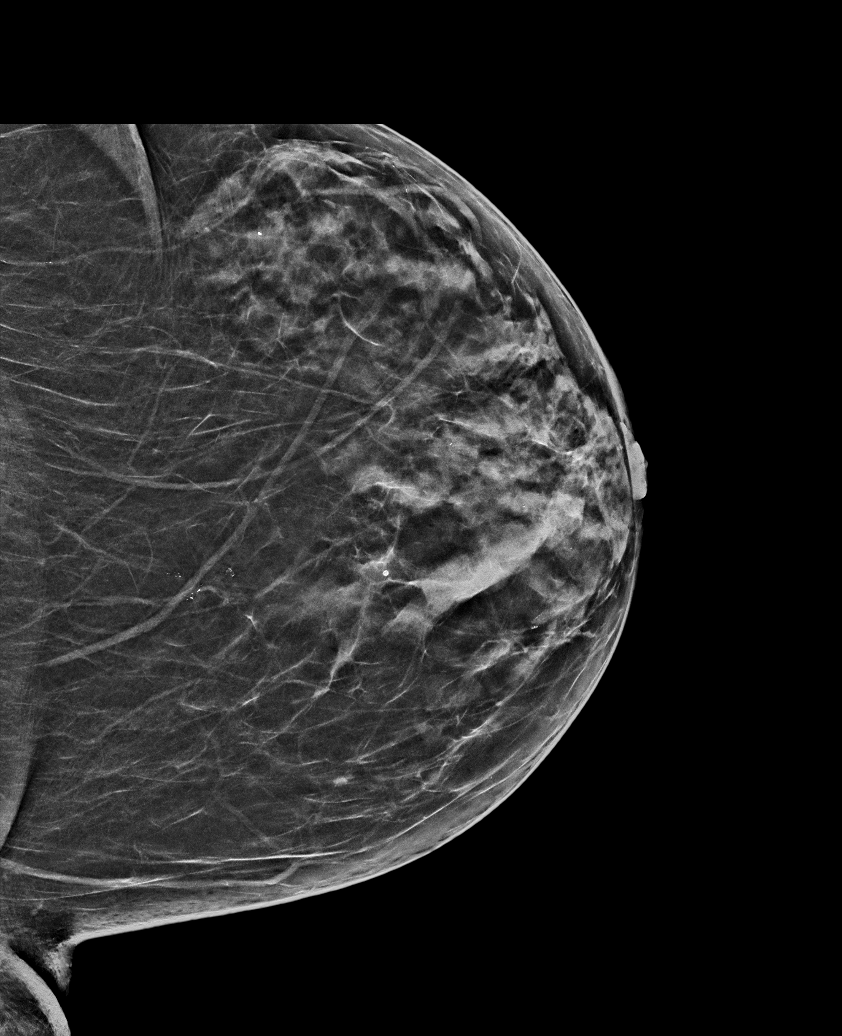

[L MLO synth-2D]
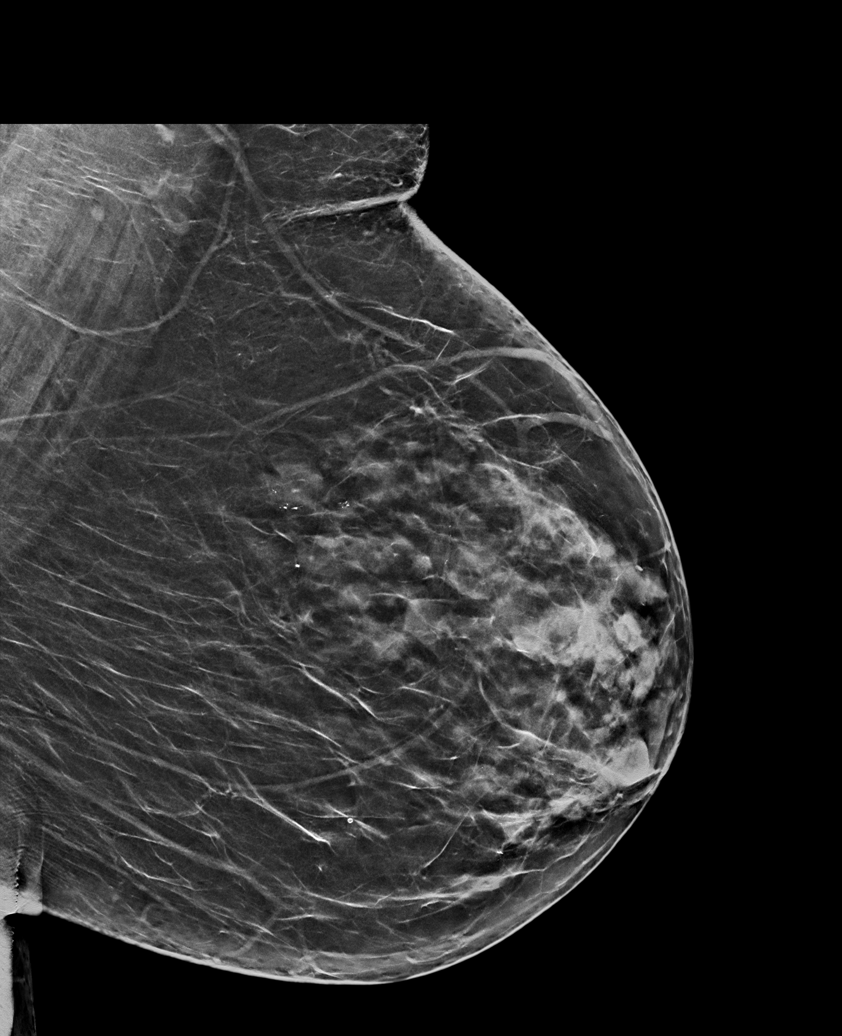

[L CC synth-2D (2 of 2)]
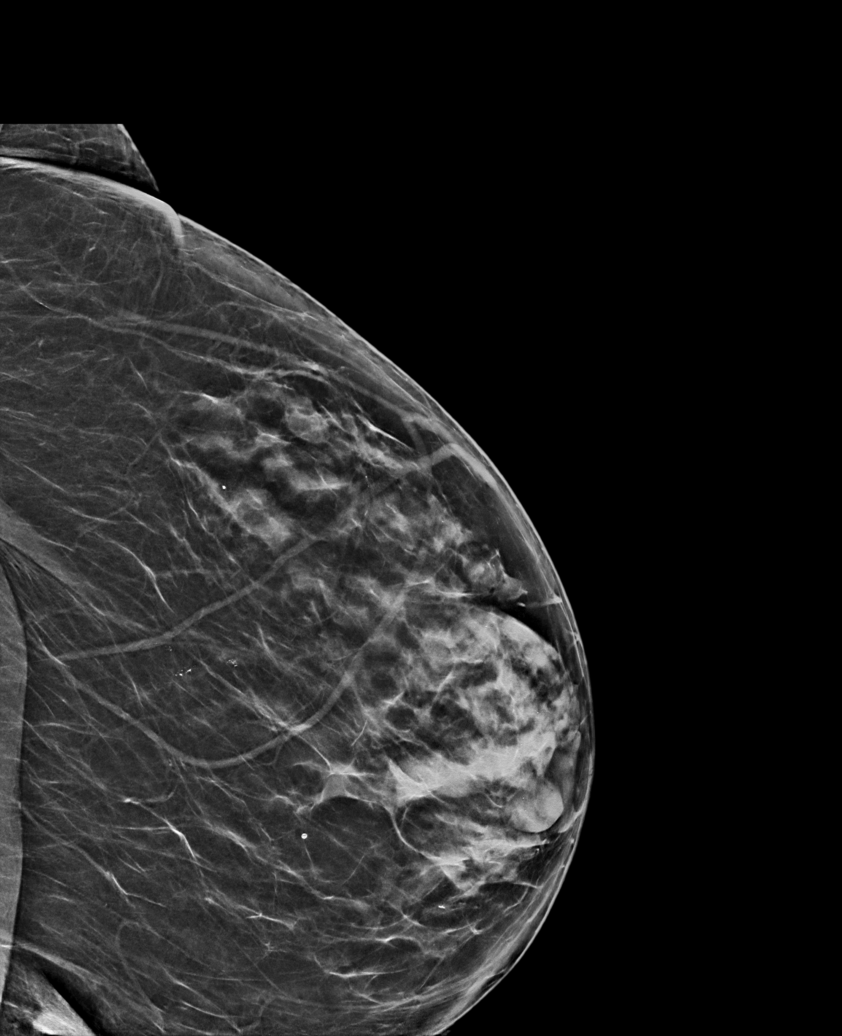

[R CC synth-2D (2 of 2)]
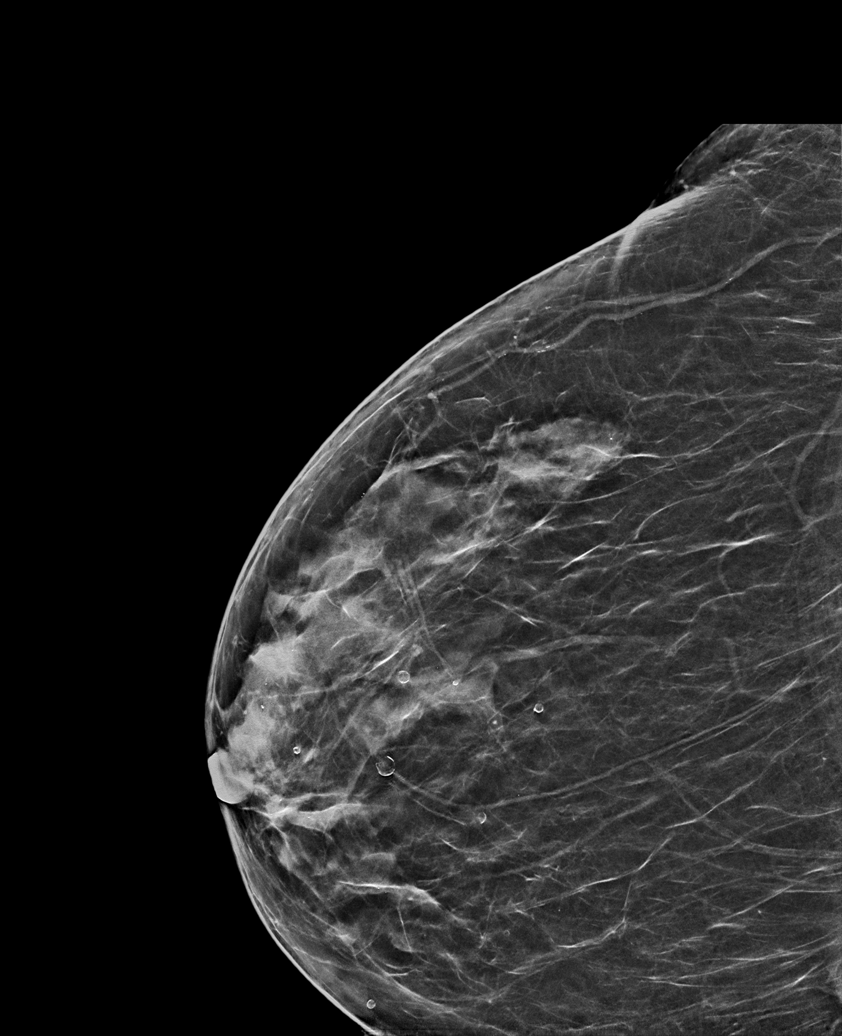

[R MLO synth-2D]
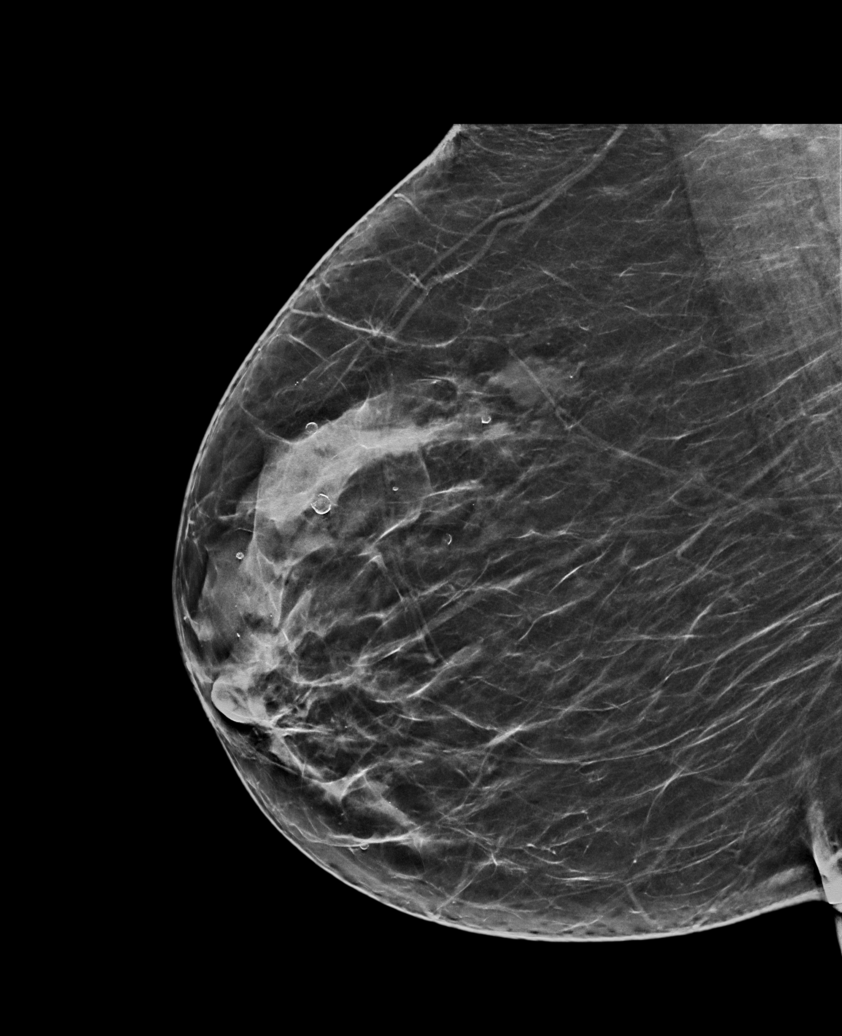

[6 of 36 positions shown; findings below may reference images not displayed]

ACR Breast Density Category c: The breast tissue is heterogeneously
dense, which may obscure small masses.
FINDINGS: There are no findings suspicious for malignancy.
IMPRESSION: No mammographic evidence of malignancy. A result letter of this
screening mammogram will be mailed directly to the patient.

RECOMMENDATION:
Screening mammogram in one year. (Code:[id number])

BI-RADS CATEGORY  1: Negative.

## 2021-07-24 DIAGNOSIS — G894 Chronic pain syndrome: Secondary | ICD-10-CM | POA: Diagnosis not present

## 2021-07-24 DIAGNOSIS — M179 Osteoarthritis of knee, unspecified: Secondary | ICD-10-CM | POA: Diagnosis not present

## 2021-07-24 DIAGNOSIS — M1990 Unspecified osteoarthritis, unspecified site: Secondary | ICD-10-CM | POA: Diagnosis not present

## 2021-07-24 DIAGNOSIS — M5136 Other intervertebral disc degeneration, lumbar region: Secondary | ICD-10-CM | POA: Diagnosis not present

## 2021-07-27 DIAGNOSIS — T162XXA Foreign body in left ear, initial encounter: Secondary | ICD-10-CM | POA: Diagnosis not present

## 2021-07-27 DIAGNOSIS — H60332 Swimmer's ear, left ear: Secondary | ICD-10-CM | POA: Diagnosis not present

## 2021-07-27 DIAGNOSIS — J301 Allergic rhinitis due to pollen: Secondary | ICD-10-CM | POA: Diagnosis not present

## 2021-08-02 ENCOUNTER — Encounter: Payer: Self-pay | Admitting: Gastroenterology

## 2021-08-12 DIAGNOSIS — G894 Chronic pain syndrome: Secondary | ICD-10-CM | POA: Diagnosis not present

## 2021-08-12 DIAGNOSIS — M5136 Other intervertebral disc degeneration, lumbar region: Secondary | ICD-10-CM | POA: Diagnosis not present

## 2021-08-12 DIAGNOSIS — M1990 Unspecified osteoarthritis, unspecified site: Secondary | ICD-10-CM | POA: Diagnosis not present

## 2021-08-17 ENCOUNTER — Telehealth: Payer: Self-pay | Admitting: Gastroenterology

## 2021-08-17 NOTE — Telephone Encounter (Signed)
Patient called states food is getting stuck in her throat and its painful seeking advise.

## 2021-08-17 NOTE — Telephone Encounter (Signed)
Lm on home vm for patient to return call.   Letter mailed to patient in 07/2021 - due for EGD but needs office visit for evaluation. Patient has been scheduled for a follow up with Dr. Silverio Decamp on Wednesday, 08/22/21 at 9:50 am.

## 2021-08-17 NOTE — Telephone Encounter (Signed)
Spoke with patient in regards to concerns. Pt is aware of appt. She has been advised to avoid tough meats and stick to a soft diet until her appt. Advised that if EGD is necessary then that will be scheduled at the time of her visit. Pt will use Flovent inhaler until her appt. Pt verbalized understanding and had no concerns at the end of the call.

## 2021-08-21 DIAGNOSIS — M1990 Unspecified osteoarthritis, unspecified site: Secondary | ICD-10-CM | POA: Diagnosis not present

## 2021-08-21 DIAGNOSIS — G894 Chronic pain syndrome: Secondary | ICD-10-CM | POA: Diagnosis not present

## 2021-08-21 DIAGNOSIS — M179 Osteoarthritis of knee, unspecified: Secondary | ICD-10-CM | POA: Diagnosis not present

## 2021-08-21 DIAGNOSIS — M5136 Other intervertebral disc degeneration, lumbar region: Secondary | ICD-10-CM | POA: Diagnosis not present

## 2021-08-22 ENCOUNTER — Ambulatory Visit (INDEPENDENT_AMBULATORY_CARE_PROVIDER_SITE_OTHER): Payer: Medicare Other | Admitting: Gastroenterology

## 2021-08-22 ENCOUNTER — Encounter: Payer: Self-pay | Admitting: Gastroenterology

## 2021-08-22 VITALS — BP 136/68 | HR 70 | Ht 64.0 in | Wt 187.0 lb

## 2021-08-22 DIAGNOSIS — K222 Esophageal obstruction: Secondary | ICD-10-CM | POA: Diagnosis not present

## 2021-08-22 DIAGNOSIS — K200 Eosinophilic esophagitis: Secondary | ICD-10-CM | POA: Diagnosis not present

## 2021-08-22 DIAGNOSIS — R1310 Dysphagia, unspecified: Secondary | ICD-10-CM | POA: Diagnosis not present

## 2021-08-22 MED ORDER — FLUTICASONE PROPIONATE HFA 220 MCG/ACT IN AERO: 2.0000 | INHALATION_SPRAY | Freq: Two times a day (BID) | RESPIRATORY_TRACT | 3 refills | Status: DC

## 2021-08-22 NOTE — Progress Notes (Signed)
Abigail [name]    [id number]    Mar 21, 1951  Primary Care Physician:Martinez, Leona Carry, NP  Referring Physician: Eulogio Bear, NP [address],  [place] [address]   Chief complaint: Dysphagia  HPI:  70 year old very pleasant female here for follow-up visit for eosinophilic esophagitis.  She feels her swallowing has gotten worse again, it had significantly improved after last EGD with dilation.  She is not able to swallow anything other than soft foods at this point.  She is also experiencing worsening heartburn and discomfort with excessive gas and retrosternal pain.  No melena or rectal bleeding.  She had delayed emptying on gastric emptying scan on Apr 03, 2021 20% emptied at 1 hr ( normal >= 10%)   36% emptied at 2 hr ( normal >= 40%)   52% emptied at 3 hr ( normal >= 70%)   68% emptied at 4 hr ( normal >= 90%)  EGD Mar 06, 2021: - LA Grade B esophagitis with no bleeding. Biopsied. - Candidiasis esophagitis. Biopsied. - Esophageal mucosal changes consistent with eosinophilic esophagitis. Biopsied. - A large amount of a phytobezoar in the stomach. - Normal examined duodenum.   EGD 09/22/20 - LA Grade B (one or more mucosal breaks greater than 5 mm, not extending between the tops of two mucosal folds) esophagitis with no bleeding was found 25 to 36 cm from the incisors. Biopsies were obtained from the proximal and distal esophagus with cold forceps for histology of suspected eosinophilic esophagitis. - The Z-line was regular and was found 35 cm from the incisors. - The gastroesophageal flap valve was visualized endoscopically and classified as Hill Grade III (minimal fold, loose to endoscope, hiatal hernia likely). - One benign-appearing, intrinsic mild (non-circumferential scarring) stenosis was found 35 to 36 cm from the incisors. This stenosis measured 1.8 cm (inner diameter) x less than one cm (in length). The stenosis was traversed. A TTS  dilator was passed through the scope. Dilation with an 18-19-20 mm balloon dilator was performed to 20 mm. The dilation site was examined following endoscope reinsertion and showed no change. - The stomach was normal. - The examined duodenum was normal.   1. Surgical [P], esophagogastric junction and distal esophagus - GASTROESOPHAGEAL MUCOSA WITH INFLAMMATION CONSISTENT WITH REFLUX. - NO INTESTINAL METAPLASIA, DYSPLASIA OR CARCINOMA. - NO FEATURES OF EOSINOPHILIC ESOPHAGITIS. 2. Surgical [P], proximal esophagus - SQUAMOUS MUCOSA WITH INCREASED EOSINOPHILS CONSISTENT WITH EOSINOPHILIC ESOPHAGITIS. - GREATER THAN 20 PER HIGH POWER FIELD.   Outpatient Encounter Medications as of 08/22/2021  Medication Sig   albuterol (VENTOLIN HFA) 108 (90 Base) MCG/ACT inhaler Inhale 2 puffs into the lungs every 4 (four) hours as needed for wheezing or shortness of breath.   Azelastine HCl 137 MCG/SPRAY SOLN as needed.   clobetasol (TEMOVATE) 0.05 % external solution Apply 1 application topically 2 (two) times daily.   COSENTYX SENSOREADY, 300 MG, 150 MG/ML SOAJ INJECT TWO PENS SUBCUTANEOUSLY EVERY 4 WEEKS. REFRIGERATE. ALLOW 15 TO 30 MINUTES AT ROOM TEMP PRIOR TO ADMINISTRATION.   diclofenac (FLECTOR) 1.3 % PTCH SMARTSIG:Topical   DULoxetine (CYMBALTA) 30 MG capsule take 1 capsule by mouth once daily (IF TOLERATED)   gabapentin (NEURONTIN) 300 MG capsule TAKE 3 CAPSULES BY MOUTH THREE TIMES DAILY (Patient taking differently: 4 caps in the morning and 4 in the evening per patient)   glucose blood (FREESTYLE LITE) test strip Use as instructed to monitor FSBS 1x daily. Dx:  E11.9   glucose monitoring kit (FREESTYLE) monitoring kit 1 each by Does not apply route as needed for other. Dispense one glucometer of choice, testing strips/lancets for once per day glucose checks.  QS for 1 month, 11 refills.   Lancets (FREESTYLE) lancets Use as instructed   lisinopril (ZESTRIL) 20 MG tablet Take 1 tablet (20 mg total)  by mouth daily.   ofloxacin (OCUFLOX) 0.3 % ophthalmic solution 4 drops as needed.   omeprazole (PRILOSEC) 40 MG capsule Take 1 capsule (40 mg total) by mouth 2 (two) times daily.   oxyCODONE (OXY IR/ROXICODONE) 5 MG immediate release tablet TK 1/2 TO 1 TABLET BY MOUTH 2 TO 3 TIMES PER DAY IF TOLERATED. NO NUCYNTA   TRULICITY 6.78 LF/8.1OF SOPN INJECT 0.75MG INTO THE SKIN ONCE A WEEK   [DISCONTINUED] ezetimibe (ZETIA) 10 MG tablet Take 1 tablet (10 mg total) by mouth daily.   [DISCONTINUED] fluticasone (FLOVENT HFA) 220 MCG/ACT inhaler 2 puffs swallowed twice daily-do not eat or drink for 30 minutes after   [DISCONTINUED] glipiZIDE (GLUCOTROL XL) 5 MG 24 hr tablet Take 1 tablet (5 mg total) by mouth daily with breakfast.   No facility-administered encounter medications on file as of 08/22/2021.    Allergies as of 08/22/2021 - Review Complete 08/22/2021  Allergen Reaction Noted   Cinnamon  01/20/2017   Statins  04/02/2017    Past Medical History:  Diagnosis Date   Allergy    Anxiety    Arthritis    hands, knees   Cancer ([place])    Cataract    surgery to remove -bilateral   COPD (chronic obstructive pulmonary disease) ([facility])    Depression    Diabetes mellitus ([facility])    a. A1c 6.6 in 05/2013 indicating new dx.   Fatty liver    Female stress incontinence    GERD (gastroesophageal reflux disease)    Heart murmur    no problems   Hemorrhoids    History of kidney stones    surgery to remove   Hyperlipidemia    Hyperplastic colon polyp 06/07/2008   Hypertension    Hypertriglyceridemia    IBS (irritable bowel syndrome)    Lung nodule    a. 58m left lung nodule by CT 05/2013.   Neuromuscular disorder ([facility])    neuropathy feet   Obesity    Psoriasis    Skin cancer     Past Surgical History:  Procedure Laterality Date   CATARACT EXTRACTION W/PHACO Left 10/06/2014   Procedure: CATARACT EXTRACTION PHACO AND INTRAOCULAR LENS PLACEMENT LEFT EYE;  Surgeon: KTonny Branch MD;  Location: [facility];  Service: Ophthalmology;  Laterality: Left;  CDE 7.35   CATARACT EXTRACTION W/PHACO Right 11/14/2014   Procedure: CATARACT EXTRACTION PHACO AND INTRAOCULAR LENS PLACEMENT RIGHT EYE;  Surgeon: KTonny Branch MD;  Location: [facility];  Service: Ophthalmology;  Laterality: Right;  CDE:6.39   COLONOSCOPY  06/2008   hx polyps   CYSTOSCOPY W/ RETROGRADES Right 01/21/2017   Procedure: CYSTOSCOPY WITH RETROGRADE PYELOGRAM;  Surgeon: AHollice Espy MD;  Location: [facility] ORS;  Service: Urology;  Laterality: Right;   CYSTOSCOPY/URETEROSCOPY/HOLMIUM LASER/STENT PLACEMENT Left 01/21/2017   Procedure: CYSTOSCOPY/URETEROSCOPY/HOLMIUM LASER/STENT PLACEMENT;  Surgeon: AHollice Espy MD;  Location: [facility] ORS;  Service: Urology;  Laterality: Left;   DORSAL COMPARTMENT RELEASE Left 06/15/2015   Procedure: LEFT WRIST DEQUERVAINS;  Surgeon: DNinetta Lights MD;  Location: [facility]  Service: Orthopedics;  Laterality: Left;   HERNIA REPAIR     MOUTH SURGERY  tooth ext   NASAL SINUS SURGERY     SHOULDER SURGERY     right   TONSILLECTOMY     UPPER GI ENDOSCOPY     normal per patient   VENTRAL HERNIA REPAIR      Family History  Problem Relation Age of Onset   Breast cancer Mother    Cancer Mother    Heart disease Maternal Grandfather    Colon polyps Maternal Aunt    Kidney cancer Neg Hx    Bladder Cancer Neg Hx    Colon cancer Neg Hx    Stomach cancer Neg Hx    Rectal cancer Neg Hx    Pancreatic cancer Neg Hx    Esophageal cancer Neg Hx     Social History   Socioeconomic History   Marital status: Married    Spouse name: Not on file   Number of children: 2   Years of education: Not on file   Highest education level: Not on file  Occupational History   Occupation: retired  Tobacco Use   Smoking status: Never   Smokeless tobacco: Never  Vaping Use   Vaping Use: Never used  Substance and Sexual Activity   Alcohol use: No   Drug use: No   Sexual activity: Yes    Birth  control/protection: Post-menopausal  Other Topics Concern   Not on file  Social History Narrative   Not on file   Social Determinants of Health   Financial Resource Strain: Not on file  Food Insecurity: Not on file  Transportation Needs: Not on file  Physical Activity: Not on file  Stress: Not on file  Social Connections: Not on file  Intimate Partner Violence: Not on file      Review of systems: All other review of systems negative except as mentioned in the HPI.   Physical Exam: Vitals:   08/22/21 0939  BP: 136/68  Pulse: 70   Body mass index is 32.1 kg/m. Gen:      No acute distress HEENT:  sclera anicteric Abd:      soft, non-tender; no palpable masses, no distension Ext:    No edema Neuro: alert and oriented x 3 Psych: normal mood and affect  Data Reviewed:  Reviewed labs, radiology imaging, old records and pertinent past GI work up   Assessment and Plan/Recommendations:  70 year old very pleasant female with history of eosinophilic esophagitis likely secondary to uncontrolled gastroesophageal acid reflux and gastroparesis   Continue omeprazole twice daily and antireflux measures  Dysphagia and esophageal stricture: We will plan for repeat EGD with esophageal biopsies and esophageal dilation   Gastroparesis: Continue small frequent meals, avoid high-fat and high-fiber diet.   The risks and benefits as well as alternatives of endoscopic procedure(s) have been discussed and reviewed. All questions answered. The patient agrees to proceed.   The patient was provided an opportunity to ask questions and all were answered. The patient agreed with the plan and demonstrated an understanding of the instructions.  Damaris Hippo , MD    CC: Eulogio Bear, NP

## 2021-08-22 NOTE — Patient Instructions (Addendum)
Eat small meals. AVOID high fat diet  You will need insurance approval for Dupixent,, Beth McKew will be handling the authorization   You have been scheduled for an endoscopy. Please follow written instructions given to you at your visit today. If you use inhalers (even only as needed), please bring them with you on the day of your procedure.   Due to recent changes in healthcare laws, you may see the results of your imaging and laboratory studies on MyChart before your provider has had a chance to review them.  We understand that in some cases there may be results that are confusing or concerning to you. Not all laboratory results come back in the same time frame and the provider may be waiting for multiple results in order to interpret others.  Please give [place] 48 hours in order for your provider to thoroughly review all the results before contacting the office for clarification of your results.    If you are age 55 or older, your body mass index should be between 23-30. Your Body mass index is 32.1 kg/m. If this is out of the aforementioned range listed, please consider follow up with your Primary Care Provider.  If you are age 32 or younger, your body mass index should be between 19-25. Your Body mass index is 32.1 kg/m. If this is out of the aformentioned range listed, please consider follow up with your Primary Care Provider.   ________________________________________________________  The [facility] providers would like to encourage you to use [facility] to communicate with providers for non-urgent requests or questions.  Due to long hold times on the telephone, sending your provider a message by [facility] may be a faster and more efficient way to get a response.  Please allow 48 business hours for a response.  Please remember that this is for non-urgent requests.  _______________________________________________________   I appreciate the  opportunity to care for you  Thank You   Harl Bowie , MD

## 2021-08-30 ENCOUNTER — Telehealth: Payer: Self-pay | Admitting: *Deleted

## 2021-08-30 DIAGNOSIS — E1149 Type 2 diabetes mellitus with other diabetic neurological complication: Secondary | ICD-10-CM

## 2021-08-30 NOTE — Telephone Encounter (Signed)
Received VM from patient.   Reports that she would like to switch Trulicity injections to oral medication.   Call placed to patient to inquire. [place].

## 2021-08-31 ENCOUNTER — Encounter: Payer: Self-pay | Admitting: Gastroenterology

## 2021-08-31 ENCOUNTER — Ambulatory Visit (AMBULATORY_SURGERY_CENTER): Payer: Medicare Other | Admitting: Gastroenterology

## 2021-08-31 VITALS — BP 153/99 | HR 64 | Temp 97.8°F | Resp 14 | Ht 64.0 in | Wt 187.0 lb

## 2021-08-31 DIAGNOSIS — R1310 Dysphagia, unspecified: Secondary | ICD-10-CM | POA: Diagnosis not present

## 2021-08-31 DIAGNOSIS — K2100 Gastro-esophageal reflux disease with esophagitis, without bleeding: Secondary | ICD-10-CM

## 2021-08-31 DIAGNOSIS — K200 Eosinophilic esophagitis: Secondary | ICD-10-CM | POA: Diagnosis not present

## 2021-08-31 DIAGNOSIS — K209 Esophagitis, unspecified without bleeding: Secondary | ICD-10-CM | POA: Diagnosis not present

## 2021-08-31 DIAGNOSIS — K2970 Gastritis, unspecified, without bleeding: Secondary | ICD-10-CM

## 2021-08-31 DIAGNOSIS — K222 Esophageal obstruction: Secondary | ICD-10-CM

## 2021-08-31 MED ORDER — SODIUM CHLORIDE 0.9 % IV SOLN: 500.0000 mL | Freq: Once | INTRAVENOUS | Status: DC

## 2021-08-31 MED ORDER — METFORMIN HCL ER 500 MG PO TB24: 500.0000 mg | ORAL_TABLET | Freq: Every day | ORAL | 1 refills | Status: DC

## 2021-08-31 MED ORDER — ESOMEPRAZOLE MAGNESIUM 40 MG PO CPDR: 40.0000 mg | DELAYED_RELEASE_CAPSULE | Freq: Two times a day (BID) | ORAL | 6 refills | Status: DC

## 2021-08-31 NOTE — Telephone Encounter (Signed)
Rx sent to pharmacy   

## 2021-08-31 NOTE — Progress Notes (Signed)
Sedate, gd SR, tolerated procedure well, VSS, report to RN 

## 2021-08-31 NOTE — Op Note (Signed)
[facility] Patient Name: Abigail [name] Procedure Date: 08/31/2021 10:09 AM MRN: [id number] Endoscopist: Mauri Pole , MD Age: 70 Referring MD:  Date of Birth: 1951/10/08 Gender: Female Account #: [id number] Procedure:                Upper GI endoscopy Indications:              Follow-up of eosinophilic esophagitis, Dysphagia,                            Follow-up of esophageal reflux Medicines:                Monitored Anesthesia Care Procedure:                Pre-Anesthesia Assessment:                           - Prior to the procedure, a History and Physical                            was performed, and patient medications and                            allergies were reviewed. The patient's tolerance of                            previous anesthesia was also reviewed. The risks                            and benefits of the procedure and the sedation                            options and risks were discussed with the patient.                            All questions were answered, and informed consent                            was obtained. Prior Anticoagulants: The patient has                            taken no previous anticoagulant or antiplatelet                            agents. ASA Grade Assessment: II - A patient with                            mild systemic disease. After reviewing the risks                            and benefits, the patient was deemed in                            satisfactory condition to undergo the procedure.  After obtaining informed consent, the endoscope was                            passed under direct vision. Throughout the                            procedure, the patient's blood pressure, pulse, and                            oxygen saturations were monitored continuously. The                            GIF [id number] #[id number] was introduced through the                            mouth, and advanced to  the second part of duodenum.                            The upper GI endoscopy was accomplished without                            difficulty. The patient tolerated the procedure                            well. Scope In: Scope Out: Findings:                 LA Grade C (one or more mucosal breaks continuous                            between tops of 2 or more mucosal folds, less than                            75% circumference) esophagitis with no bleeding was                            found 30 to 38 cm from the incisors. Biopsies were                            obtained from the proximal and distal esophagus                            with cold forceps for histology of suspected                            eosinophilic esophagitis.                           A few benign-appearing, intrinsic mild                            (non-circumferential scarring) stenoses were found                            33 to  38 cm from the incisors. The stenoses were                            traversed. A TTS dilator was passed through the                            scope. Dilation with a 15-16.5-18 mm balloon                            dilator was performed to 18 mm.                           Patchy, white plaques were found in the entire                            esophagus. Biopsies were taken with a cold forceps                            for histology.                           A small amount of a phytobezoar was found in the                            gastric body.                           Patchy mild inflammation characterized by                            congestion (edema) and erythema was found in the                            entire examined stomach.                           The examined duodenum was normal. Complications:            No immediate complications. Estimated Blood Loss:     Estimated blood loss was minimal. Impression:               - LA Grade C esophagitis with no bleeding.  Biopsied.                           - Benign-appearing esophageal stenoses. Dilated.                           - Esophageal plaques were found, suspicious for                            candidiasis. Biopsied.                           - A small amount of a phytobezoar in the stomach.                           -  Gastritis.                           - Normal examined duodenum. Recommendation:           - Clear liquid diet - advance as tolerated to soft                            diet.                           - No aspirin, ibuprofen, naproxen, or other                            non-steroidal anti-inflammatory drugs.                           - Continue present medications.                           - Await pathology results.                           - Return to GI clinic in 6-8 weeks, please call                            with any questions.                           - Follow an antireflux regimen.                           - Use Nexium (esomeprazole) 40 mg PO BID X 60 tabs                            with 6 refills. Mauri Pole, MD 08/31/2021 10:44:26 AM This report has been signed electronically.

## 2021-08-31 NOTE — Patient Instructions (Signed)
Thank you for allowing [place] to care for you today! Await biopsy results, approximately 2 weeks.   Return to GI clinic in 6-8 weeks.  We will call to set up this up. Discontinue Omeprazole and change to a new prescription.  We have sent this to your pharmacy. Start taking Nexium 40 mg by mouth twice daily. Resume your other medications today. Follow a post-dilation diet today.  Handout provided. Return to normal daily activities tomorrow.   YOU HAD AN ENDOSCOPIC PROCEDURE TODAY AT [facility]:   Refer to the procedure report that was given to you for any specific questions about what was found during the examination.  If the procedure report does not answer your questions, please call your gastroenterologist to clarify.  If you requested that your care partner not be given the details of your procedure findings, then the procedure report has been included in a sealed envelope for you to review at your convenience later.  YOU SHOULD EXPECT: Some feelings of bloating in the abdomen. Passage of more gas than usual.  Walking can help get rid of the air that was put into your GI tract during the procedure and reduce the bloating. If you had a lower endoscopy (such as a colonoscopy or flexible sigmoidoscopy) you may notice spotting of blood in your stool or on the toilet paper. If you underwent a bowel prep for your procedure, you may not have a normal bowel movement for a few days.  Please Note:  You might notice some irritation and congestion in your nose or some drainage.  This is from the oxygen used during your procedure.  There is no need for concern and it should clear up in a day or so.  SYMPTOMS TO REPORT IMMEDIATELY:   Following upper endoscopy (EGD)  Vomiting of blood or coffee ground material  New chest pain or pain under the shoulder blades  Painful or persistently difficult swallowing  New shortness of breath  Fever of 100F or higher  Black, tarry-looking stools  For  urgent or emergent issues, a gastroenterologist can be reached at any hour by calling [phone]. Do not use MyChart messaging for urgent concerns.    DIET:  We do recommend a small meal at first, but then you may proceed to your regular diet.  Drink plenty of fluids but you should avoid alcoholic beverages for 24 hours.  ACTIVITY:  You should plan to take it easy for the rest of today and you should NOT DRIVE or use heavy machinery until tomorrow (because of the sedation medicines used during the test).    FOLLOW UP: Our staff will call the number listed on your records 48-72 hours following your procedure to check on you and address any questions or concerns that you may have regarding the information given to you following your procedure. If we do not reach you, we will leave a message.  We will attempt to reach you two times.  During this call, we will ask if you have developed any symptoms of COVID 19. If you develop any symptoms (ie: fever, flu-like symptoms, shortness of breath, cough etc.) before then, please call [phone].  If you test positive for Covid 19 in the 2 weeks post procedure, please call and report this information to [place].    If any biopsies were taken you will be contacted by phone or by letter within the next 1-3 weeks.  Please call us at [phone] if you have not heard  about the biopsies in 3 weeks.    SIGNATURES/CONFIDENTIALITY: You and/or your care partner have signed paperwork which will be entered into your electronic medical record.  These signatures attest to the fact that that the information above on your After Visit Summary has been reviewed and is understood.  Full responsibility of the confidentiality of this discharge information lies with you and/or your care-partner.

## 2021-08-31 NOTE — Progress Notes (Signed)
Pt's states no medical or surgical changes since previsit or office visit. VS assessed by D.T 

## 2021-08-31 NOTE — Progress Notes (Signed)
Please refer to office visit note 08/22/21. No additional changes in H&P Patient is appropriate for planned procedure(s) and anesthesia in an ambulatory setting  K. Denzil Magnuson , MD [phone]

## 2021-08-31 NOTE — Telephone Encounter (Signed)
Please send in Metformin ER 500 mg 1 tablet PO daily with breakfast in the morning #90 1 refill.  Needs follow up visit in February

## 2021-08-31 NOTE — Telephone Encounter (Addendum)
Received call from patient.   Reports that she is having difficulty remembering to take Trulicity weekly. Reports that she would like to switch to a daily medication as this is easier for her to remember.   States that she has tried Metformin previously, but would like to try ER version.   Please advise.

## 2021-08-31 NOTE — Telephone Encounter (Signed)
Call placed to patient. LMTRC.  

## 2021-09-04 ENCOUNTER — Other Ambulatory Visit: Payer: Self-pay

## 2021-09-04 ENCOUNTER — Telehealth: Payer: Self-pay

## 2021-09-04 MED ORDER — MAGIC MOUTHWASH W/LIDOCAINE: ORAL | 1 refills | Status: DC

## 2021-09-04 NOTE — Telephone Encounter (Signed)
Called order to [facility] for [employer] and gave patient instructions for taking. Also asked her not to drink or eat anything right after taking this.

## 2021-09-04 NOTE — Telephone Encounter (Signed)
Patient had esophageal dilation, has history of erosive esophagitis and eosinophilic esophagitis. Continue with soft diet for additional 1 week. Beth please send prescription for Magic mouthwash with lidocaine for 2 weeks, advised patient to use 5 to 10 cc every 6 hours as needed for sore throat and discomfort.  She should avoid eating or drinking immediately after lidocaine to prevent aspiration.  Thank you

## 2021-09-04 NOTE — Telephone Encounter (Signed)
  Follow up Call-  Call back number 08/31/2021 03/06/2021 09/22/2020 12/23/2018  Post procedure Call Back phone  # [phone]  Permission to leave phone message Yes Yes Yes Yes  Some recent data might be hidden     Patient questions:  Do you have a fever, pain , or abdominal swelling? No. Pain Score  0 *  Have you tolerated food without any problems? Yes.    Have you been able to return to your normal activities? Yes.    Do you have any questions about your discharge instructions: Diet   No. Medications  No. Follow up visit  No.  Do you have questions or concerns about your Care? Yes.    Patient states she is doing well, however, she still has some sore throat when swallowing, she states it has improved since her procedure and is mild. Since this is continuing to improve, advised patient to continue to take small bites, chew her food well, and make she is drinking fluids with her meals. Advised patient to call us if this does resolve in a day or two or immediately if it worsens. Patient verbalizes understanding. Also made a follow-up appointment for her in the office with Dr. Silverio Decamp as noted on the procedure report since the patient has not made this appointment yet. Pt. Was to return in 6-8 weeks, gave her th first available appointment at that time which is Jan 5 at 0810. This note is being routed to Dr. Silverio Decamp for review.  Actions: * If pain score is 4 or above: No action needed, pain <4. Have you developed a fever since your procedure? no  2.   Have you had an respiratory symptoms (SOB or cough) since your procedure? no  3.   Have you tested positive for COVID 19 since your procedure no  4.   Have you had any family members/close contacts diagnosed with the COVID 19 since your procedure?  no   If yes to any of these questions please route to Joylene John, RN and Joella Prince, RN

## 2021-09-11 DIAGNOSIS — G894 Chronic pain syndrome: Secondary | ICD-10-CM | POA: Diagnosis not present

## 2021-09-17 ENCOUNTER — Telehealth: Payer: Self-pay

## 2021-09-17 NOTE — Telephone Encounter (Signed)
These are very common side effects of this medication.  She can try to take every other day for 2 weeks and then try to go back to daily.  Of, we can also stop Metformin and try alternative medication like Jardiance 10 mg daily if she is interested in a different medication.

## 2021-09-17 NOTE — Telephone Encounter (Signed)
Patient willing to try taking one every other day for 2 weeks and aware to call us if symptoms worsen.

## 2021-09-17 NOTE — Telephone Encounter (Signed)
Pt called in stating that she thinks she may be experiencing some side effects from a new med that she was prescribed. Pt said she was experiencing nausea, vomiting, upset stomach, and a metallic taste in her mouth. Pt would like to here from the nurse asap as she has been having these symptoms since Sat 11/12. Please advise.  Cb#: [phone]

## 2021-09-19 DIAGNOSIS — G894 Chronic pain syndrome: Secondary | ICD-10-CM | POA: Diagnosis not present

## 2021-09-19 DIAGNOSIS — M25559 Pain in unspecified hip: Secondary | ICD-10-CM | POA: Diagnosis not present

## 2021-09-19 DIAGNOSIS — M79609 Pain in unspecified limb: Secondary | ICD-10-CM | POA: Diagnosis not present

## 2021-09-19 DIAGNOSIS — M9951 Intervertebral disc stenosis of neural canal of cervical region: Secondary | ICD-10-CM | POA: Diagnosis not present

## 2021-09-19 DIAGNOSIS — M4726 Other spondylosis with radiculopathy, lumbar region: Secondary | ICD-10-CM | POA: Diagnosis not present

## 2021-09-19 DIAGNOSIS — E0843 Diabetes mellitus due to underlying condition with diabetic autonomic (poly)neuropathy: Secondary | ICD-10-CM | POA: Diagnosis not present

## 2021-09-19 DIAGNOSIS — G8929 Other chronic pain: Secondary | ICD-10-CM | POA: Diagnosis not present

## 2021-10-02 ENCOUNTER — Other Ambulatory Visit: Payer: Self-pay | Admitting: Gastroenterology

## 2021-10-02 ENCOUNTER — Ambulatory Visit (INDEPENDENT_AMBULATORY_CARE_PROVIDER_SITE_OTHER): Payer: Medicare Other | Admitting: Gastroenterology

## 2021-10-02 ENCOUNTER — Encounter: Payer: Self-pay | Admitting: Gastroenterology

## 2021-10-02 VITALS — BP 132/70 | HR 66 | Ht 62.0 in | Wt 185.0 lb

## 2021-10-02 DIAGNOSIS — R1319 Other dysphagia: Secondary | ICD-10-CM | POA: Diagnosis not present

## 2021-10-02 DIAGNOSIS — K2100 Gastro-esophageal reflux disease with esophagitis, without bleeding: Secondary | ICD-10-CM | POA: Diagnosis not present

## 2021-10-02 MED ORDER — SUCRALFATE 1 G PO TABS: 1.0000 g | ORAL_TABLET | Freq: Three times a day (TID) | ORAL | 2 refills | Status: DC

## 2021-10-02 MED ORDER — DEXLANSOPRAZOLE 60 MG PO CPDR: 60.0000 mg | DELAYED_RELEASE_CAPSULE | Freq: Every day | ORAL | 11 refills | Status: DC

## 2021-10-02 NOTE — Patient Instructions (Signed)
We have sent Dexilant and Carafate to your pharmacy  AVOID sugar, soda, and sweet tea   Follow up in 3 months  If you are age 72 or older, your body mass index should be between 23-30. Your Body mass index is 33.84 kg/m. If this is out of the aforementioned range listed, please consider follow up with your Primary Care Provider.  If you are age 3 or younger, your body mass index should be between 19-25. Your Body mass index is 33.84 kg/m. If this is out of the aformentioned range listed, please consider follow up with your Primary Care Provider.   ________________________________________________________  The [facility] providers would like to encourage you to use [facility] to communicate with providers for non-urgent requests or questions.  Due to long hold times on the telephone, sending your provider a message by [facility] may be a faster and more efficient way to get a response.  Please allow 48 business hours for a response.  Please remember that this is for non-urgent requests.  _______________________________________________________   Thank you for choosing [facility]  Kavitha Nandigam,MD

## 2021-10-02 NOTE — Progress Notes (Signed)
Abigail [name]    [id number]    1951-01-26  Primary Care Physician:Martinez, Leona Carry, NP  Referring Physician: Eulogio Bear, NP [address]  [place] [address]   Chief complaint: GERD, dysphagia  HPI: 70 year old very pleasant female here for follow-up visit for chronic GERD, eosinophilic esophagitis and dysphagia.  Overall she feels her swallowing has improved s/p EGD and esophageal dilation.  She is using omeprazole twice daily and Carafate as needed   Continues to have breakthrough heartburn intermittently, she is trying to alter her diet with some improvement.   She had delayed emptying on gastric emptying scan on Apr 03, 2021 20% emptied at 1 hr ( normal >= 10%)   36% emptied at 2 hr ( normal >= 40%)   52% emptied at 3 hr ( normal >= 70%)   68% emptied at 4 hr ( normal >= 90%)   EGD Mar 06, 2021: - LA Grade B esophagitis with no bleeding. Biopsied. - Candidiasis esophagitis. Biopsied. - Esophageal mucosal changes consistent with eosinophilic esophagitis. Biopsied. - A large amount of a phytobezoar in the stomach. - Normal examined duodenum.   EGD 09/22/20 - LA Grade B (one or more mucosal breaks greater than 5 mm, not extending between the tops of two mucosal folds) esophagitis with no bleeding was found 25 to 36 cm from the incisors. Biopsies were obtained from the proximal and distal esophagus with cold forceps for histology of suspected eosinophilic esophagitis. - The Z-line was regular and was found 35 cm from the incisors. - The gastroesophageal flap valve was visualized endoscopically and classified as Hill Grade III (minimal fold, loose to endoscope, hiatal hernia likely). - One benign-appearing, intrinsic mild (non-circumferential scarring) stenosis was found 35 to 36 cm from the incisors. This stenosis measured 1.8 cm (inner diameter) x less than one cm (in length). The stenosis was traversed. A TTS dilator was passed through  the scope. Dilation with an 18-19-20 mm balloon dilator was performed to 20 mm. The dilation site was examined following endoscope reinsertion and showed no change. - The stomach was normal. - The examined duodenum was normal.   1. Surgical [P], esophagogastric junction and distal esophagus - GASTROESOPHAGEAL MUCOSA WITH INFLAMMATION CONSISTENT WITH REFLUX. - NO INTESTINAL METAPLASIA, DYSPLASIA OR CARCINOMA. - NO FEATURES OF EOSINOPHILIC ESOPHAGITIS. 2. Surgical [P], proximal esophagus - SQUAMOUS MUCOSA WITH INCREASED EOSINOPHILS CONSISTENT WITH EOSINOPHILIC ESOPHAGITIS. - GREATER THAN 20 PER HIGH POWER FIELD.  EGD 08/31/21 - LA Grade C esophagitis with no bleeding. Biopsied. - Benign-appearing esophageal stenoses. Dilated. - Esophageal plaques were found, suspicious for candidiasis. Biopsied. - A small amount of a phytobezoar in the stomach. - Gastritis. - Normal examined duodenum. 1. Surgical [P], distal esophagus - CHRONIC NONSPECIFIC ESOPHAGITIS. SEE NOTE - NEGATIVE FOR INCREASED INTRAEPITHELIAL SIGNIFICANCE 2. Surgical [P], proximal esophageal biopsy's - MILDLY ACTIVE CHRONIC NONSPECIFIC ESOPHAGITIS. SEE NOTE - NEGATIVE FOR INCREASED INTRAEPITHELIAL EOSINOPHILS  Outpatient Encounter Medications as of 10/02/2021  Medication Sig   albuterol (VENTOLIN HFA) 108 (90 Base) MCG/ACT inhaler Inhale 2 puffs into the lungs every 4 (four) hours as needed for wheezing or shortness of breath.   Azelastine HCl 137 MCG/SPRAY SOLN as needed.   clobetasol (TEMOVATE) 0.05 % external solution Apply 1 application topically 2 (two) times daily.   COSENTYX SENSOREADY, 300 MG, 150 MG/ML SOAJ INJECT TWO PENS SUBCUTANEOUSLY EVERY 4 WEEKS. REFRIGERATE. ALLOW 15 TO 30 MINUTES AT ROOM TEMP PRIOR TO  ADMINISTRATION.   diclofenac (FLECTOR) 1.3 % PTCH    DULoxetine (CYMBALTA) 30 MG capsule    esomeprazole (NEXIUM) 40 MG capsule Take 1 capsule (40 mg total) by mouth 2 (two) times daily before a meal.    fluticasone (FLOVENT HFA) 220 MCG/ACT inhaler 2 puffs by Other route in the morning and at bedtime. Swallow the puffs   gabapentin (NEURONTIN) 300 MG capsule TAKE 3 CAPSULES BY MOUTH THREE TIMES DAILY (Patient taking differently: 4 caps in the morning and 4 in the evening per patient)   glucose blood (FREESTYLE LITE) test strip Use as instructed to monitor FSBS 1x daily. Dx: E11.9   glucose monitoring kit (FREESTYLE) monitoring kit 1 each by Does not apply route as needed for other. Dispense one glucometer of choice, testing strips/lancets for once per day glucose checks.  QS for 1 month, 11 refills.   Lancets (FREESTYLE) lancets Use as instructed   lisinopril (ZESTRIL) 20 MG tablet Take 1 tablet (20 mg total) by mouth daily.   magic mouthwash w/lidocaine SOLN Lidocaine 2%-Maalox-Benadryl 12.5/70m: in equal parts. Swish and swallow 5-10 cc every 6 hours PRN for sore throat and discomfort.Avoid eating or drinking right after.   metFORMIN (GLUCOPHAGE XR) 500 MG 24 hr tablet Take 1 tablet (500 mg total) by mouth daily with breakfast.   ofloxacin (OCUFLOX) 0.3 % ophthalmic solution 4 drops as needed.   oxyCODONE (OXY IR/ROXICODONE) 5 MG immediate release tablet TK 1/2 TO 1 TABLET BY MOUTH 2 TO 3 TIMES PER DAY IF TOLERATED. NO NUCYNTA   [DISCONTINUED] TRULICITY 01.60MFU/9.3ATSOPN INJECT 0.75MG INTO THE SKIN ONCE A WEEK   No facility-administered encounter medications on file as of 10/02/2021.    Allergies as of 10/02/2021 - Review Complete 10/02/2021  Allergen Reaction Noted   Cinnamon  01/20/2017   Statins  04/02/2017    Past Medical History:  Diagnosis Date   Allergy    Anxiety    Arthritis    hands, knees   [place]    Cataract    surgery to remove -bilateral   COPD [place] disease) [place]    Depression    Diabetes mellitus ([place]    a. A1c 6.6 in 05/2013 indicating new dx.   Fatty liver    Female stress incontinence    GERD (gastroesophageal reflux disease)     Heart murmur    no problems   Hemorrhoids    History of kidney stones    surgery to remove   Hyperlipidemia    Hyperplastic colon polyp 06/07/2008   Hypertension    Hypertriglyceridemia    IBS (irritable bowel syndrome)    Lung nodule    a. 426mleft lung nodule by CT 05/2013.   Neuromuscular disorder (HCC)    neuropathy feet   Obesity    Psoriasis    Skin cancer     Past Surgical History:  Procedure Laterality Date   CATARACT EXTRACTION W/PHACO Left 10/06/2014   Procedure: CATARACT EXTRACTION PHACO AND INTRAOCULAR LENS PLACEMENT LEFT EYE;  Surgeon: KeTonny BranchMD;  Location: [facility];  Service: Ophthalmology;  Laterality: Left;  CDE 7.35   CATARACT EXTRACTION W/PHACO Right 11/14/2014   Procedure: CATARACT EXTRACTION PHACO AND INTRAOCULAR LENS PLACEMENT RIGHT EYE;  Surgeon: KeTonny BranchMD;  Location: [facility];  Service: Ophthalmology;  Laterality: Right;  CDE:6.39   COLONOSCOPY  06/2008   hx polyps   CYSTOSCOPY W/ RETROGRADES Right 01/21/2017   Procedure: CYSTOSCOPY WITH RETROGRADE PYELOGRAM;  Surgeon: AsCaryl Pina  [name], MD;  Location: [facility];  Service: Urology;  Laterality: Right;   CYSTOSCOPY/URETEROSCOPY/HOLMIUM LASER/STENT PLACEMENT Left 01/21/2017   Procedure: CYSTOSCOPY/URETEROSCOPY/HOLMIUM LASER/STENT PLACEMENT;  Surgeon: Hollice Espy, MD;  Location: [facility];  Service: Urology;  Laterality: Left;   DORSAL COMPARTMENT RELEASE Left 06/15/2015   Procedure: LEFT WRIST DEQUERVAINS;  Surgeon: Ninetta Lights, MD;  Location: [place];  Service: Orthopedics;  Laterality: Left;   HERNIA REPAIR     MOUTH SURGERY     tooth ext   NASAL SINUS SURGERY     SHOULDER SURGERY     right   TONSILLECTOMY     UPPER GI ENDOSCOPY     normal per patient   VENTRAL HERNIA REPAIR      Family History  Problem Relation Age of Onset   Breast cancer Mother    Cancer Mother    Heart disease Maternal Grandfather    Colon polyps Maternal Aunt    Kidney cancer Neg Hx    Bladder  Cancer Neg Hx    Colon cancer Neg Hx    Stomach cancer Neg Hx    Rectal cancer Neg Hx    Pancreatic cancer Neg Hx    Esophageal cancer Neg Hx     Social History   Socioeconomic History   Marital status: Married    Spouse name: Not on file   Number of children: 2   Years of education: Not on file   Highest education level: Not on file  Occupational History   Occupation: retired  Tobacco Use   Smoking status: Never   Smokeless tobacco: Never  Vaping Use   Vaping Use: Never used  Substance and Sexual Activity   Alcohol use: No   Drug use: No   Sexual activity: Yes    Birth control/protection: Post-menopausal  Other Topics Concern   Not on file  Social History Narrative   Not on file   Social Determinants of Health   Financial Resource Strain: Not on file  Food Insecurity: Not on file  Transportation Needs: Not on file  Physical Activity: Not on file  Stress: Not on file  Social Connections: Not on file  Intimate Partner Violence: Not on file      Review of systems: All other review of systems negative except as mentioned in the HPI.   Physical Exam: Vitals:   10/02/21 1459  BP: 132/70  Pulse: 66  SpO2: 96%   Body mass index is 33.84 kg/m. Gen:      No acute distress Neuro: alert and oriented x 3 Psych: normal mood and affect  Data Reviewed:  Reviewed labs, radiology imaging, old records and pertinent past GI work up   Assessment and Plan/Recommendations:  70 year old very pleasant female with history of eosinophilic esophagitis likely secondary to uncontrolled gastroesophageal acid reflux and gastroparesis   GERD with severe erosive esophagitis despite high-dose PPI, omeprazole twice daily Will switch to Dexilant 60 mg daily  Use Carafate before meals and at bedtime as needed Continue antireflux measures and dietary modifications   Dysphagia and esophageal stricture: Secondary to uncontrolled GERD and eosinophilic esophagitis.  Most recent  EGD with biopsies negative for increased eosinophils, fungal infection or CMV.  She is s/p esophageal dilation with improvement of dysphagia.    Gastroparesis: Continue small frequent meals, avoid high-fat and high-fiber diet.  Return in 3 months   The patient was provided an opportunity to ask questions and all were answered. The patient agreed with the plan and  demonstrated an understanding of the instructions.  Damaris Hippo , MD    CC: Eulogio Bear, NP

## 2021-10-04 ENCOUNTER — Telehealth: Payer: Self-pay | Admitting: Gastroenterology

## 2021-10-04 NOTE — Telephone Encounter (Signed)
Patient called stating that her pharmacy ([facility] on [address]) needed to talk to you about her Dexilant prescription.  Thank you.

## 2021-10-04 NOTE — Telephone Encounter (Signed)
Called pharmacy back they said I needed to call [phone] to do a prior authorization

## 2021-10-04 NOTE — Telephone Encounter (Signed)
I called patient to inform improved by the insurance

## 2021-10-04 NOTE — Telephone Encounter (Signed)
Dexilant Appoved

## 2021-10-12 DIAGNOSIS — G894 Chronic pain syndrome: Secondary | ICD-10-CM | POA: Diagnosis not present

## 2021-10-17 DIAGNOSIS — M5136 Other intervertebral disc degeneration, lumbar region: Secondary | ICD-10-CM | POA: Diagnosis not present

## 2021-10-17 DIAGNOSIS — M1990 Unspecified osteoarthritis, unspecified site: Secondary | ICD-10-CM | POA: Diagnosis not present

## 2021-10-17 DIAGNOSIS — G894 Chronic pain syndrome: Secondary | ICD-10-CM | POA: Diagnosis not present

## 2021-10-17 DIAGNOSIS — M5030 Other cervical disc degeneration, unspecified cervical region: Secondary | ICD-10-CM | POA: Diagnosis not present

## 2021-10-19 ENCOUNTER — Other Ambulatory Visit: Payer: Self-pay | Admitting: Gastroenterology

## 2021-11-07 ENCOUNTER — Ambulatory Visit: Payer: Medicare Other | Admitting: Podiatry

## 2021-11-08 ENCOUNTER — Ambulatory Visit: Payer: Medicare Other | Admitting: Gastroenterology

## 2021-11-14 DIAGNOSIS — G894 Chronic pain syndrome: Secondary | ICD-10-CM | POA: Diagnosis not present

## 2021-11-16 ENCOUNTER — Ambulatory Visit (INDEPENDENT_AMBULATORY_CARE_PROVIDER_SITE_OTHER): Payer: Medicare Other | Admitting: Nurse Practitioner

## 2021-11-16 ENCOUNTER — Other Ambulatory Visit: Payer: Self-pay

## 2021-11-16 ENCOUNTER — Encounter: Payer: Self-pay | Admitting: Nurse Practitioner

## 2021-11-16 VITALS — BP 136/72 | HR 71 | Ht 62.0 in | Wt 188.0 lb

## 2021-11-16 DIAGNOSIS — E782 Mixed hyperlipidemia: Secondary | ICD-10-CM | POA: Diagnosis not present

## 2021-11-16 DIAGNOSIS — E1142 Type 2 diabetes mellitus with diabetic polyneuropathy: Secondary | ICD-10-CM | POA: Diagnosis not present

## 2021-11-16 DIAGNOSIS — J42 Unspecified chronic bronchitis: Secondary | ICD-10-CM

## 2021-11-16 DIAGNOSIS — I10 Essential (primary) hypertension: Secondary | ICD-10-CM

## 2021-11-16 DIAGNOSIS — E1149 Type 2 diabetes mellitus with other diabetic neurological complication: Secondary | ICD-10-CM | POA: Diagnosis not present

## 2021-11-16 DIAGNOSIS — R7981 Abnormal blood-gas level: Secondary | ICD-10-CM

## 2021-11-16 DIAGNOSIS — R911 Solitary pulmonary nodule: Secondary | ICD-10-CM | POA: Diagnosis not present

## 2021-11-16 MED ORDER — LISINOPRIL 20 MG PO TABS: 10.0000 mg | ORAL_TABLET | Freq: Every day | ORAL | 1 refills | Status: DC

## 2021-11-16 MED ORDER — METFORMIN HCL ER 500 MG PO TB24: 500.0000 mg | ORAL_TABLET | Freq: Every day | ORAL | 1 refills | Status: DC

## 2021-11-16 NOTE — Assessment & Plan Note (Signed)
Chronic.  It does not appear she is taking dulaglutide or glipizide any longer.  She is taking Metformin 750 mg XR every other day and reports she blood sugars are well controlled.  Check HgbA1c today.  Foot exam is up to date.  I again encouraged the patient to schedule an eye exam.  Check urine microalbumin today.  She is intolerant to statins and we briefly discussed Zetia but she would like to think about this for now.  LDL goal is less than 70 given history of diabetes.  Follow up pending results and I encouraged her to establish with new PCP as soon as able.

## 2021-11-16 NOTE — Assessment & Plan Note (Signed)
Chronic.  She is managed by pain management with gabapentin, Cymbalta, and oxycodone - continue collaboration.

## 2021-11-16 NOTE — Assessment & Plan Note (Signed)
Chronic.  Blood pressure at goal of less than 140/90 today in office.  Continue lisinopril 10 mg daily.  Check kidney function with electrolytes and urine microalbumin today.  Follow up with new PCP.

## 2021-11-16 NOTE — Assessment & Plan Note (Signed)
Chronic.  She has been using rescue inhaler more frequently.  Her borderline low oxygen saturation today in the office and at home is slightly concerning.  She does have a history of bronchiectasis per last CT chest in 2019.  There was also a pulmonary nodule that was noted on the CT at that time.  I would like to repeat a CT chest and possible refer to Pulmonary depending on the results.  It looks like she had been referred in the past and does not remember if she went or not.  She does have a significant history of gastritis and reflux and has had possible aspiration pneumonia in the past that was attributing to the bronchiectasis.

## 2021-11-16 NOTE — Assessment & Plan Note (Signed)
Chronic.  Has not tolerated statins well in the past.  LDL goal is less than 70.  We discussed Zetia, however patient would like to think about this for now.  Follow up with new pcp.

## 2021-11-16 NOTE — Assessment & Plan Note (Signed)
Chronic.  Will order CT chest today for monitoring of nodule as well as borderline low oxygen saturation.  No s/s acute infection today.

## 2021-11-16 NOTE — Progress Notes (Signed)
Subjective:    Patient ID: Abigail [name], female    DOB: [birth year], 71 y.o.   MRN: [id number]  HPI: Abigail [name] is a 71 y.o. female presenting for follow up.  Chief Complaint  Patient presents with   Diabetes   DIABETES Taking Metformin every other day.  This has helped with the diarrhea.  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  Recently went to [place], felt like pain and breathing was better while there.   Reports she has been sick with cough and congestion for the past couple weeks.  The cough and congestion is improving.  She denies fevers, sinus pressure, purulent drainage, shortness of breath, pain with inspiration, and chest pain today.  Tells me her oxygen was low at Dr. Ethel Rana office, has an oxygen meter at home.  Reports oxygen has been higher than 90% at home, on average 92-93%.    Has rescue inhaler she has been using more recently during this illness.   Allergies  Allergen Reactions   Cinnamon     Dry throat    Statins     Outpatient Encounter Medications as of 11/16/2021  Medication Sig Note   albuterol (VENTOLIN HFA) 108 (90 Base) MCG/ACT inhaler Inhale 2 puffs into the lungs every 4 (four) hours as needed for wheezing or shortness of breath.    Azelastine HCl 137 MCG/SPRAY SOLN as needed.    clobetasol (TEMOVATE) 0.05 % external solution Apply 1 application  topically 2 (two) times daily.    COSENTYX SENSOREADY, 300 MG, 150 MG/ML SOAJ INJECT TWO PENS SUBCUTANEOUSLY EVERY 4 WEEKS. REFRIGERATE. ALLOW 15 TO 30 MINUTES AT ROOM TEMP PRIOR TO ADMINISTRATION.    dexlansoprazole (DEXILANT) 60 MG capsule Take 1 capsule (60 mg total) by mouth daily.    diclofenac (FLECTOR) 1.3 % PTCH     DULoxetine (CYMBALTA) 30 MG capsule     esomeprazole (NEXIUM) 40 MG capsule Take 1 capsule (40 mg total) by mouth 2 (two) times daily before a meal.    gabapentin (NEURONTIN) 300 MG capsule Take 300 mg by mouth as directed. Take 4-5 cap po am and at night and 1-2 midday if tolerated    glucose blood (FREESTYLE LITE) test strip Use as instructed to monitor FSBS 1x daily. Dx: E11.9    glucose monitoring kit (FREESTYLE) monitoring kit 1 each by Does not apply route as needed for other. Dispense one glucometer of choice, testing strips/lancets for once per day glucose checks.  QS for 1 month, 11 refills.    Lancets (FREESTYLE) lancets Use as instructed    magic mouthwash w/lidocaine SOLN Lidocaine 2%-Maalox-Benadryl 12.5/52m: in equal parts. Swish and swallow 5-10 cc every 6 hours PRN for sore throat and discomfort.Avoid eating or drinking right after.    oxycodone (OXY-IR) 5 MG capsule Take 5 mg by mouth as directed. Limit 1-2 tablets by mouth 2-3 times per day if tolerated no xtampza er    sucralfate (  CARAFATE) 1 g tablet Take 1 tablet (1 g total) by mouth 3 (three) times daily.    [DISCONTINUED] fluticasone (FLOVENT HFA) 220 MCG/ACT inhaler 2 puffs by Other route in the morning and at bedtime. Swallow the puffs    [DISCONTINUED] lisinopril (ZESTRIL) 20 MG tablet Take 10 mg by mouth daily.    [DISCONTINUED] metFORMIN (GLUCOPHAGE XR) 500 MG 24 hr tablet Take 1 tablet (500 mg total) by mouth daily with breakfast.    lisinopril (ZESTRIL) 20 MG tablet Take 0.5 tablets (10 mg total) by mouth daily.    metFORMIN (GLUCOPHAGE XR) 500 MG 24 hr tablet Take 1 tablet (500 mg total) by mouth  daily with breakfast.    [DISCONTINUED] gabapentin (NEURONTIN) 300 MG capsule TAKE 3 CAPSULES BY MOUTH THREE TIMES DAILY (Patient taking differently: 4 caps in the morning and 4 in the evening per patient)    [DISCONTINUED] lisinopril (ZESTRIL) 20 MG tablet Take 1 tablet (20 mg total) by mouth daily.    [DISCONTINUED] ofloxacin (OCUFLOX) 0.3 % ophthalmic solution 4 drops as needed.    [DISCONTINUED] omeprazole (PRILOSEC) 40 MG capsule TAKE 1 CAPSULE BY MOUTH TWICE A DAY FOR 3 MONTHS THEN ONCE DAILY    [DISCONTINUED] oxyCODONE (OXY IR/ROXICODONE) 5 MG immediate release tablet TK 1/2 TO 1 TABLET BY MOUTH 2 TO 3 TIMES PER DAY IF TOLERATED. NO NUCYNTA 11/09/2018: Pt taking 1 tablet per day   No facility-administered encounter medications on file as of 11/16/2021.    Patient Active Problem List   Diagnosis Date Noted   Eosinophilic esophagitis 00/93/8182   Acute non-recurrent pansinusitis 06/01/2021   Dysphagia 09/15/2019   Upper airway cough syndrome 02/12/2018   Bronchiectasis without complication ([place]) 99/37/1696   Type 2 diabetes mellitus with neurological complications ([place]) 78/93/8101   Diabetic neuropathy ([place]) 04/02/2017   COPD (chronic obstructive pulmonary disease) ([place]) 04/02/2017   Aortic valve sclerosis 04/02/2017   Pulmonary nodule 05/21/2013   IRRITABLE BOWEL SYNDROME 05/18/2008   Hyperlipidemia 05/17/2008   Essential hypertension 05/17/2008   HEMORRHOIDS 05/17/2008   GERD 05/17/2008   FATTY LIVER DISEASE 05/17/2008   PSORIASIS 05/17/2008   Osteoarthritis 05/17/2008   STRESS INCONTINENCE 05/17/2008    Past Medical History:  Diagnosis Date   Allergy    Anxiety    Arthritis    hands, knees   Cancer ([place])    Cataract    surgery to remove -bilateral   COPD (chronic obstructive pulmonary disease) ([place])    Depression    Diabetes mellitus ([place])    a. A1c 6.6 in 05/2013 indicating new dx.   Fatty liver    Female stress incontinence    GERD (gastroesophageal reflux  disease)    Heart murmur    no problems   Hemorrhoids    History of kidney stones    surgery to remove   Hyperlipidemia    Hyperplastic colon polyp 06/07/2008   Hypertension    Hypertriglyceridemia    IBS (irritable bowel syndrome)    Lung nodule    a. 29m left lung nodule by CT 05/2013.   Neuromuscular disorder (HCC)    neuropathy feet   Obesity    Psoriasis    Skin cancer     Relevant past medical, surgical, family and social history reviewed and updated as indicated. Interim medical history since our last visit reviewed.  Review of Systems Per HPI unless specifically indicated above     Objective:    BP 136/72    Pulse 71  Ht 5' 2"  (1.575 m)    Wt 188 lb (85.3 kg)    SpO2 92%    BMI 34.39 kg/m   Wt Readings from Last 3 Encounters:  11/16/21 188 lb (85.3 kg)  10/02/21 185 lb (83.9 kg)  08/31/21 187 lb (84.8 kg)    Physical Exam Vitals and nursing note reviewed.  Constitutional:      General: She is not in acute distress.    Appearance: Normal appearance. She is obese. She is not toxic-appearing.  Eyes:     General: No scleral icterus.    Extraocular Movements: Extraocular movements intact.  Cardiovascular:     Rate and Rhythm: Normal rate and regular rhythm.     Heart sounds: Normal heart sounds. No murmur heard. Pulmonary:     Effort: Pulmonary effort is normal. No respiratory distress.     Breath sounds: Normal breath sounds. No wheezing, rhonchi or rales.  Abdominal:     General: Abdomen is flat. Bowel sounds are normal.     Palpations: Abdomen is soft.  Musculoskeletal:     Right lower leg: No edema.     Left lower leg: No edema.  Skin:    General: Skin is warm and dry.     Coloration: Skin is not jaundiced or pale.     Findings: No erythema.  Neurological:     Mental Status: She is alert and oriented to person, place, and time.     Motor: No weakness.     Gait: Gait normal.  Psychiatric:        Mood and Affect: Mood normal.        Behavior:  Behavior normal.        Thought Content: Thought content normal.        Judgment: Judgment normal.      Assessment & Plan:   Problem List Items Addressed This Visit       Cardiovascular and Mediastinum   Essential hypertension    Chronic.  Blood pressure at goal of less than 140/90 today in office.  Continue lisinopril 10 mg daily.  Check kidney function with electrolytes and urine microalbumin today.  Follow up with new PCP.      Relevant Medications   lisinopril (ZESTRIL) 20 MG tablet   Other Relevant Orders   BASIC METABOLIC PANEL WITH GFR     Respiratory   COPD (chronic obstructive pulmonary disease) (HCC)    Chronic.  She has been using rescue inhaler more frequently.  Her borderline low oxygen saturation today in the office and at home is slightly concerning.  She does have a history of bronchiectasis per last CT chest in 2019.  There was also a pulmonary nodule that was noted on the CT at that time.  I would like to repeat a CT chest and possible refer to Pulmonary depending on the results.  It looks like she had been referred in the past and does not remember if she went or not.  She does have a significant history of gastritis and reflux and has had possible aspiration pneumonia in the past that was attributing to the bronchiectasis.        Endocrine   Type 2 diabetes mellitus with neurological complications (HCC) - Primary    Chronic.  It does not appear she is taking dulaglutide or glipizide any longer.  She is taking Metformin 750 mg XR every other day and reports she blood sugars are well controlled.  Check HgbA1c today.  Foot exam  is up to date.  I again encouraged the patient to schedule an eye exam.  Check urine microalbumin today.  She is intolerant to statins and we briefly discussed Zetia but she would like to think about this for now.  LDL goal is less than 70 given history of diabetes.  Follow up pending results and I encouraged her to establish with new PCP as soon  as able.      Relevant Medications   lisinopril (ZESTRIL) 20 MG tablet   metFORMIN (GLUCOPHAGE XR) 500 MG 24 hr tablet   Other Relevant Orders   Hemoglobin O3A   BASIC METABOLIC PANEL WITH GFR   Microalbumin, urine   Diabetic neuropathy (HCC)    Chronic.  She is managed by pain management with gabapentin, Cymbalta, and oxycodone - continue collaboration.      Relevant Medications   lisinopril (ZESTRIL) 20 MG tablet   metFORMIN (GLUCOPHAGE XR) 500 MG 24 hr tablet     Other   Pulmonary nodule    Chronic.  Will order CT chest today for monitoring of nodule as well as borderline low oxygen saturation.  No s/s acute infection today.       Relevant Orders   CT Chest Wo Contrast   Hyperlipidemia    Chronic.  Has not tolerated statins well in the past.  LDL goal is less than 70.  We discussed Zetia, however patient would like to think about this for now.  Follow up with new pcp.      Relevant Medications   lisinopril (ZESTRIL) 20 MG tablet   Other Visit Diagnoses     Borderline low oxygen saturation level       Relevant Orders   CT Chest Wo Contrast        Follow up plan: Return for with new pcp.

## 2021-11-17 LAB — BASIC METABOLIC PANEL WITH GFR
BUN: 10 mg/dL (ref 7–25)
CO2: 29 mmol/L (ref 20–32)
Calcium: 9.4 mg/dL (ref 8.6–10.4)
Chloride: 104 mmol/L (ref 98–110)
Creat: 0.67 mg/dL (ref 0.60–1.00)
Glucose, Bld: 155 mg/dL — ABNORMAL HIGH (ref 65–99)
Potassium: 4.1 mmol/L (ref 3.5–5.3)
Sodium: 141 mmol/L (ref 135–146)
eGFR: 93 mL/min/{1.73_m2} (ref >=60)

## 2021-11-17 LAB — HEMOGLOBIN A1C
Hgb A1c MFr Bld: 7.9 % of total Hgb — ABNORMAL HIGH (ref <5.7)
Mean Plasma Glucose: 180 mg/dL
eAG (mmol/L): 10 mmol/L

## 2021-11-17 LAB — MICROALBUMIN, URINE: Microalb, Ur: 0.3 mg/dL

## 2021-11-19 ENCOUNTER — Telehealth: Payer: Self-pay

## 2021-11-19 NOTE — Telephone Encounter (Signed)
Left message for patient to call back regarding results and recommendations. °

## 2021-11-19 NOTE — Telephone Encounter (Signed)
-----   Message from Eulogio Bear, NP sent at 11/19/2021  7:41 AM EST ----- Please notify with labs.  HgbA1c is 7.9% which is above goal of less than 7.5%.  She has been on Trulicity and glipizide in the past, I recommend starting back on one of these to help get her sugar levels down.  Kidney function and electrolytes are great and urine protein test is stable.

## 2021-11-21 ENCOUNTER — Other Ambulatory Visit: Payer: Self-pay | Admitting: Physician Assistant

## 2021-11-28 ENCOUNTER — Other Ambulatory Visit: Payer: Self-pay

## 2021-11-28 ENCOUNTER — Ambulatory Visit (INDEPENDENT_AMBULATORY_CARE_PROVIDER_SITE_OTHER): Payer: Medicare Other | Admitting: Nurse Practitioner

## 2021-11-28 ENCOUNTER — Encounter: Payer: Self-pay | Admitting: Nurse Practitioner

## 2021-11-28 ENCOUNTER — Ambulatory Visit (HOSPITAL_COMMUNITY)
Admission: RE | Admit: 2021-11-28 | Discharge: 2021-11-28 | Disposition: A | Discharge status: Home / Self Care | Payer: Medicare Other | Source: Ambulatory Visit | Attending: Nurse Practitioner | Admitting: Nurse Practitioner

## 2021-11-28 VITALS — BP 128/80 | HR 75 | Ht 62.0 in | Wt 186.0 lb

## 2021-11-28 DIAGNOSIS — R059 Cough, unspecified: Secondary | ICD-10-CM | POA: Diagnosis not present

## 2021-11-28 DIAGNOSIS — R052 Subacute cough: Secondary | ICD-10-CM | POA: Insufficient documentation

## 2021-11-28 DIAGNOSIS — N898 Other specified noninflammatory disorders of vagina: Secondary | ICD-10-CM

## 2021-11-28 DIAGNOSIS — J441 Chronic obstructive pulmonary disease with (acute) exacerbation: Secondary | ICD-10-CM | POA: Diagnosis not present

## 2021-11-28 DIAGNOSIS — E1149 Type 2 diabetes mellitus with other diabetic neurological complication: Secondary | ICD-10-CM | POA: Diagnosis not present

## 2021-11-28 DIAGNOSIS — J42 Unspecified chronic bronchitis: Secondary | ICD-10-CM | POA: Insufficient documentation

## 2021-11-28 LAB — WET PREP FOR TRICH, YEAST, CLUE

## 2021-11-28 MED ORDER — DOXYCYCLINE HYCLATE 100 MG PO TABS: 100.0000 mg | ORAL_TABLET | Freq: Two times a day (BID) | ORAL | 0 refills | Status: DC

## 2021-11-28 MED ORDER — LIRAGLUTIDE 18 MG/3ML ~~LOC~~ SOPN: PEN_INJECTOR | SUBCUTANEOUS | 0 refills | Status: DC

## 2021-11-28 NOTE — Assessment & Plan Note (Signed)
Chronic.  Last A1c 7.9%.  Patient agreeable to GLP-1a therapy.  Start Victoza and continue Metformin.  Encouraged small meals high in protein.

## 2021-11-28 NOTE — Patient Instructions (Signed)
Need to schedule CT scan of lungs Please call [phone] and choose prompt #3

## 2021-11-28 NOTE — Progress Notes (Signed)
Subjective:    Patient ID: Abigail [name], female    DOB: 01-Aug-1951, 71 y.o.   MRN: [id number]  HPI: Abigail [name] is a 71 y.o. female presenting for follow up.  Chief Complaint  Patient presents with   Cough   Vaginitis   Diabetes   UPPER RESPIRATORY TRACT INFECTION Onset: weeks; had taken Augmentin but no improvement  Fever: no Cough: yes; coughing up green mucus Shortness of breath: yes Wheezing: yes Chest pain: no Chest tightness: yes Chest congestion: yes Nasal congestion: yes Runny nose: no Post nasal drip: no Sneezing: no Sore throat: no Swollen glands: no Sinus pressure: yes Headache: yes Face pain: no Toothache: no Ear pain: no  Ear pressure: no  Eyes red/itching:no Eye drainage/crusting: no  Nausea: no  Vomiting: no Diarrhea: no  Change in appetite: no  Loss of taste/smell: no  Rash: no Fatigue: yes Sick contacts:  yes; husband Strep contacts: no  Context: stable   VAGINAL ITCHING Duration: weeks Discharge description: none  Pruritus: yes Dysuria: no Malodorous: no Urinary frequency: no Fevers: no Abdominal pain: no  Sexual activity: monogamous History of sexually transmitted diseases: no Recent antibiotic use: no Context: stable  Treatments attempted:  none  DIABETES Last A1c 7.9%.  She is interested in starting back on GLP-1a.    Allergies  Allergen Reactions   Cinnamon     Dry throat    Statins     Outpatient Encounter Medications as of 11/28/2021  Medication Sig   albuterol (VENTOLIN HFA) 108 (90 Base) MCG/ACT inhaler Inhale 2 puffs into the lungs every 4 (four) hours as needed for wheezing or shortness of breath.   Azelastine HCl 137 MCG/SPRAY SOLN as needed.   clobetasol (TEMOVATE) 0.05 % external solution Apply 1 application topically 2 (two) times daily.   COSENTYX SENSOREADY, 300 MG, 150 MG/ML SOAJ INJECT TWO PENS SUBCUTANEOUSLY EVERY 4 WEEKS. REFRIGERATE. ALLOW 15 TO 30 MINUTES AT ROOM TEMP PRIOR TO ADMINISTRATION.    dexlansoprazole (DEXILANT) 60 MG capsule Take 1 capsule (60 mg total) by mouth daily.   diclofenac (FLECTOR) 1.3 % PTCH    doxycycline (VIBRA-TABS) 100 MG tablet Take 1 tablet (100 mg total) by mouth 2 (two) times daily.   DULoxetine (CYMBALTA) 30 MG capsule    esomeprazole (NEXIUM) 40 MG capsule Take 1 capsule (40 mg total) by mouth 2 (two) times daily before a meal.   gabapentin (NEURONTIN) 300 MG capsule Take 300 mg by mouth as directed. Take 4-5 cap po am and at night and 1-2 midday if tolerated   glucose blood (FREESTYLE LITE) test strip Use as instructed to monitor FSBS 1x daily. Dx: E11.9   glucose monitoring kit (FREESTYLE) monitoring kit 1 each by Does not apply route as needed for other. Dispense one glucometer of choice, testing strips/lancets for once per day glucose checks.  QS for 1 month, 11 refills.   Lancets (FREESTYLE) lancets Use as instructed   liraglutide (VICTOZA) 18 MG/3ML SOPN Inject 0.6 mg into the skin daily for 7 days, THEN 1.2 mg daily for 7 days, THEN 1.8 mg daily.   lisinopril (ZESTRIL) 20 MG tablet Take 0.5 tablets (10 mg total) by mouth daily.   magic mouthwash w/lidocaine SOLN Lidocaine 2%-Maalox-Benadryl 12.5/46m: in equal parts. Swish and swallow 5-10 cc every 6 hours PRN for sore throat and discomfort.Avoid eating or drinking right after.   metFORMIN (GLUCOPHAGE XR) 500 MG 24 hr tablet Take 1 tablet (500 mg total) by mouth daily with  breakfast.   oxycodone (OXY-IR) 5 MG capsule Take 5 mg by mouth as directed. Limit 1-2 tablets by mouth 2-3 times per day if tolerated no xtampza er   sucralfate (CARAFATE) 1 g tablet Take 1 tablet (1 g total) by mouth 3 (three) times daily.   No facility-administered encounter medications on file as of 11/28/2021.    Patient Active Problem List   Diagnosis Date Noted   Eosinophilic esophagitis 32/99/2426   Acute non-recurrent pansinusitis 06/01/2021   Dysphagia 09/15/2019   Upper airway cough syndrome 02/12/2018    Bronchiectasis without complication ([place]) 83/41/9622   Type 2 diabetes mellitus with neurological complications ([place]) 29/79/8921   Diabetic neuropathy ([place]) 04/02/2017   COPD (chronic obstructive pulmonary disease) ([place]) 04/02/2017   Aortic valve sclerosis 04/02/2017   Pulmonary nodule 05/21/2013   IRRITABLE BOWEL SYNDROME 05/18/2008   Hyperlipidemia 05/17/2008   Essential hypertension 05/17/2008   HEMORRHOIDS 05/17/2008   GERD 05/17/2008   FATTY LIVER DISEASE 05/17/2008   PSORIASIS 05/17/2008   Osteoarthritis 05/17/2008   STRESS INCONTINENCE 05/17/2008    Past Medical History:  Diagnosis Date   Allergy    Anxiety    Arthritis    hands, knees   Cancer ([place])    Cataract    surgery to remove -bilateral   COPD (chronic obstructive pulmonary disease) ([place])    Depression    Diabetes mellitus ([place])    a. A1c 6.6 in 05/2013 indicating new dx.   Fatty liver    Female stress incontinence    GERD (gastroesophageal reflux disease)    Heart murmur    no problems   Hemorrhoids    History of kidney stones    surgery to remove   Hyperlipidemia    Hyperplastic colon polyp 06/07/2008   Hypertension    Hypertriglyceridemia    IBS (irritable bowel syndrome)    Lung nodule    a. 61m left lung nodule by CT 05/2013.   Neuromuscular disorder (HCC)    neuropathy feet   Obesity    Psoriasis    Skin cancer     Relevant past medical, surgical, family and social history reviewed and updated as indicated. Interim medical history since our last visit reviewed.  Review of Systems Per HPI unless specifically indicated above     Objective:    BP 128/80    Pulse 75    Ht 5' 2"  (1.575 m)    Wt 186 lb (84.4 kg)    SpO2 100%    BMI 34.02 kg/m   Wt Readings from Last 3 Encounters:  11/28/21 186 lb (84.4 kg)  11/16/21 188 lb (85.3 kg)  10/02/21 185 lb (83.9 kg)    Physical Exam Vitals and nursing note reviewed. Exam conducted with a chaperone present (Elizabeth Palau CMA).   Constitutional:      General: She is not in acute distress.    Appearance: Normal appearance. She is obese. She is not toxic-appearing.  HENT:     Right Ear: External ear normal.     Left Ear: External ear normal.     Nose: Congestion present. No rhinorrhea.     Right Sinus: Maxillary sinus tenderness and frontal sinus tenderness present.     Left Sinus: Maxillary sinus tenderness and frontal sinus tenderness present.     Mouth/Throat:     Mouth: Mucous membranes are moist.     Pharynx: Oropharynx is clear.  Eyes:     General: No scleral icterus.    Extraocular Movements: Extraocular movements  intact.  Cardiovascular:     Rate and Rhythm: Normal rate and regular rhythm.     Heart sounds: Murmur heard.  Pulmonary:     Effort: Pulmonary effort is normal. No respiratory distress.     Breath sounds: Normal breath sounds. No wheezing, rhonchi or rales.  Abdominal:     General: Abdomen is flat. Bowel sounds are normal.     Palpations: Abdomen is soft.  Genitourinary:    General: Normal vulva.     Labia:        Right: No rash or tenderness.        Left: No rash or tenderness.      Vagina: Erythema present. No vaginal discharge.     Cervix: Normal.     Uterus: Normal.      Adnexa: Right adnexa normal and left adnexa normal.     Comments: Vaginal atrophy Musculoskeletal:     Right lower leg: No edema.     Left lower leg: No edema.  Lymphadenopathy:     Lower Body: No right inguinal adenopathy.  Skin:    General: Skin is warm and dry.     Coloration: Skin is not jaundiced or pale.     Findings: No erythema.  Neurological:     Mental Status: She is alert and oriented to person, place, and time.     Motor: No weakness.     Gait: Gait normal.  Psychiatric:        Mood and Affect: Mood normal.        Behavior: Behavior normal.        Thought Content: Thought content normal.        Judgment: Judgment normal.      Assessment & Plan:   Problem List Items Addressed This Visit        Respiratory   COPD (chronic obstructive pulmonary disease) ([place]) - Primary    Chronic.  Oxygen saturation is 100% today in the office. It sounds like she may have a bacterial infection - possibly sinusitis, however given recent antimicrobial use, I would like to cover for also COPD with doxycycline.  Obtain chest x-ray today.  I encouraged patient to call and schedule CT of her chest.  Continue rescue inhaler as needed for now.      Relevant Medications   doxycycline (VIBRA-TABS) 100 MG tablet     Endocrine   Type 2 diabetes mellitus with neurological complications (HCC)    Chronic.  Last A1c 7.9%.  Patient agreeable to GLP-1a therapy.  Start Victoza and continue Metformin.  Encouraged small meals high in protein.        Relevant Medications   liraglutide (VICTOZA) 18 MG/3ML SOPN   Other Visit Diagnoses     Subacute cough       COPD exacerbation vs. sinusitis.  Treat with doxycycline; no wheezing today.  Chest x-ray and CT chest have been ordered.   Relevant Orders   DG Chest 2 View   Vaginal itching       Wet prep today negative for yeast/BV.  Discussed vaginal atrophy; patient would like to try coservative vaginal hygiene measures before vaginal estrogen.   Relevant Orders   WET PREP FOR [place] (Completed)        Follow up plan: Return for with new PCP.

## 2021-11-28 NOTE — Assessment & Plan Note (Signed)
Chronic.  Oxygen saturation is 100% today in the office. It sounds like she may have a bacterial infection - possibly sinusitis, however given recent antimicrobial use, I would like to cover for also COPD with doxycycline.  Obtain chest x-ray today.  I encouraged patient to call and schedule CT of her chest.  Continue rescue inhaler as needed for now.

## 2021-11-29 ENCOUNTER — Telehealth: Payer: Self-pay

## 2021-11-29 NOTE — Telephone Encounter (Signed)
-----   Message from Eulogio Bear, NP sent at 11/29/2021  3:43 PM EST ----- Please notify the chest x-ray does not show pneumonia.  Continue with the plan of care as we discussed earlier this week.

## 2021-11-29 NOTE — Telephone Encounter (Signed)
Informed patient of results

## 2021-12-03 ENCOUNTER — Telehealth: Payer: Self-pay

## 2021-12-03 NOTE — Telephone Encounter (Signed)
Please encourage her not to use any creams/lotions/perfumes on genitalia.  She could be having reaction to them.  Please encourage her to only use water on perineum.

## 2021-12-03 NOTE — Telephone Encounter (Signed)
Pt states she has been trying OTC vaginal itch relief cream (vagisil) with no improvement. Pt has also tried washing all her underwear and washcloths in a different detergent and this has not helped. Pt would like to know what else you would recommend.  Please advise, thanks!

## 2021-12-03 NOTE — Telephone Encounter (Signed)
Pt called in stating that she was seen in office by NP last week but pt states that the otc meds NP suggested are not working. Pt wanted to know what she should do or if there is some med that could be called in to pharmacy to help. Please advise.  Cb#: [phone]

## 2021-12-03 NOTE — Telephone Encounter (Signed)
She should be taking doxycycline, not just OTC meds

## 2021-12-04 NOTE — Telephone Encounter (Signed)
Advised patient per provider note.

## 2021-12-05 NOTE — Telephone Encounter (Signed)
Patient has been following provider's advise; still having severe irritation. Patient requesting additional advise or medication/treatment.  Please advise at [phone].

## 2021-12-06 NOTE — Telephone Encounter (Signed)
If vaginal irritation has worsened from last week, please make OV.  I did not appreciated external irritation on examination last OV

## 2021-12-07 ENCOUNTER — Encounter: Payer: Self-pay | Admitting: Nurse Practitioner

## 2021-12-07 ENCOUNTER — Ambulatory Visit (INDEPENDENT_AMBULATORY_CARE_PROVIDER_SITE_OTHER): Payer: Medicare Other | Admitting: Nurse Practitioner

## 2021-12-07 ENCOUNTER — Other Ambulatory Visit: Payer: Self-pay

## 2021-12-07 VITALS — BP 142/82 | HR 90 | Ht 62.0 in | Wt 186.0 lb

## 2021-12-07 DIAGNOSIS — N898 Other specified noninflammatory disorders of vagina: Secondary | ICD-10-CM | POA: Diagnosis not present

## 2021-12-07 DIAGNOSIS — R300 Dysuria: Secondary | ICD-10-CM | POA: Diagnosis not present

## 2021-12-07 DIAGNOSIS — N904 Leukoplakia of vulva: Secondary | ICD-10-CM | POA: Diagnosis not present

## 2021-12-07 LAB — URINALYSIS, ROUTINE W REFLEX MICROSCOPIC
Bacteria, UA: NONE SEEN /HPF
Bilirubin Urine: NEGATIVE
Hgb urine dipstick: NEGATIVE
Hyaline Cast: NONE SEEN /LPF
Ketones, ur: NEGATIVE
Leukocytes,Ua: NEGATIVE
Nitrite: NEGATIVE
Protein, ur: NEGATIVE
RBC / HPF: NONE SEEN /HPF (ref 0–2)
Specific Gravity, Urine: 1.025 (ref 1.001–1.035)
WBC, UA: NONE SEEN /HPF (ref 0–5)
pH: 5.5 (ref 5.0–8.0)

## 2021-12-07 LAB — WET PREP FOR TRICH, YEAST, CLUE

## 2021-12-07 MED ORDER — CLOBETASOL PROPIONATE 0.05 % EX OINT: 1.0000 "application " | TOPICAL_OINTMENT | Freq: Every evening | CUTANEOUS | 0 refills | Status: DC

## 2021-12-07 NOTE — Progress Notes (Signed)
Subjective:    Patient ID: Abigail [name], female    DOB: 1951-08-12, 71 y.o.   MRN: [id number]  HPI: Abigail [name] is a 71 y.o. female presenting for vaginal itching and dysuria.  Chief Complaint  Patient presents with   Vaginal Itching   URINARY SYMPTOMS Duration: days Dysuria: yes Urinary frequency: no Urgency: no Small volume voids: yes Symptom severity: no Urinary incontinence: no Foul odor: no Hematuria: no Abdominal pain: no Back pain: no Suprapubic pain/pressure: no Flank pain: no Fever:  no Nausea: no Vomiting: no Vaginal discharge: no Treatments attempted: nothing tried   VAGINAL ITCHING Duration: weeks Discharge description: no discharge  Pruritus: yes; external genitalia Dysuria: yes Malodorous: no Urinary frequency: no Fevers: no Abdominal pain: no  Recent antibiotic use: yes; recently treated with doxycycline Context: stable  Treatments attempted: good vaginal hygiene  Allergies  Allergen Reactions   Cinnamon     Dry throat    Statins     Outpatient Encounter Medications as of 12/07/2021  Medication Sig   albuterol (VENTOLIN HFA) 108 (90 Base) MCG/ACT inhaler Inhale 2 puffs into the lungs every 4 (four) hours as needed for wheezing or shortness of breath.   Azelastine HCl 137 MCG/SPRAY SOLN as needed.   clobetasol (TEMOVATE) 0.05 % external solution Apply 1 application topically 2 (two) times daily.   clobetasol ointment (TEMOVATE) 6.29 % Apply 1 application topically at bedtime. Apply nightly to vulva for 6 weeks; if symptoms improve can apply 2-3 times per week.   COSENTYX SENSOREADY, 300 MG, 150 MG/ML SOAJ INJECT TWO PENS SUBCUTANEOUSLY EVERY 4 WEEKS. REFRIGERATE. ALLOW 15 TO 30 MINUTES AT ROOM TEMP PRIOR TO ADMINISTRATION.   dexlansoprazole (DEXILANT) 60 MG capsule Take 1 capsule (60 mg total) by mouth daily.   diclofenac (FLECTOR) 1.3 % PTCH    DULoxetine (CYMBALTA) 30 MG capsule    esomeprazole (NEXIUM) 40 MG capsule Take 1 capsule (40  mg total) by mouth 2 (two) times daily before a meal.   gabapentin (NEURONTIN) 300 MG capsule Take 300 mg by mouth as directed. Take 4-5 cap po am and at night and 1-2 midday if tolerated   glucose blood (FREESTYLE LITE) test strip Use as instructed to monitor FSBS 1x daily. Dx: E11.9   glucose monitoring kit (FREESTYLE) monitoring kit 1 each by Does not apply route as needed for other. Dispense one glucometer of choice, testing strips/lancets for once per day glucose checks.  QS for 1 month, 11 refills.   Lancets (FREESTYLE) lancets Use as instructed   lisinopril (ZESTRIL) 20 MG tablet Take 0.5 tablets (10 mg total) by mouth daily.   magic mouthwash w/lidocaine SOLN Lidocaine 2%-Maalox-Benadryl 12.5/64m: in equal parts. Swish and swallow 5-10 cc every 6 hours PRN for sore throat and discomfort.Avoid eating or drinking right after.   metFORMIN (GLUCOPHAGE XR) 500 MG 24 hr tablet Take 1 tablet (500 mg total) by mouth daily with breakfast.   oxycodone (OXY-IR) 5 MG capsule Take 5 mg by mouth as directed. Limit 1-2 tablets by mouth 2-3 times per day if tolerated no xtampza er   sucralfate (CARAFATE) 1 g tablet Take 1 tablet (1 g total) by mouth 3 (three) times daily.   [DISCONTINUED] doxycycline (VIBRA-TABS) 100 MG tablet Take 1 tablet (100 mg total) by mouth 2 (two) times daily.   [DISCONTINUED] liraglutide (VICTOZA) 18 MG/3ML SOPN Inject 0.6 mg into the skin daily for 7 days, THEN 1.2 mg daily for 7 days, THEN 1.8 mg daily.  No facility-administered encounter medications on file as of 12/07/2021.    Patient Active Problem List   Diagnosis Date Noted   Eosinophilic esophagitis 39/01/91   Acute non-recurrent pansinusitis 06/01/2021   Dysphagia 09/15/2019   Upper airway cough syndrome 02/12/2018   Bronchiectasis without complication ([place]) 33/00/7622   Type 2 diabetes mellitus with neurological complications ([place]) 63/33/5456   Diabetic neuropathy ([place]) 04/02/2017   COPD (chronic obstructive  pulmonary disease) ([place]) 04/02/2017   Aortic valve sclerosis 04/02/2017   Pulmonary nodule 05/21/2013   IRRITABLE BOWEL SYNDROME 05/18/2008   Hyperlipidemia 05/17/2008   Essential hypertension 05/17/2008   HEMORRHOIDS 05/17/2008   GERD 05/17/2008   FATTY LIVER DISEASE 05/17/2008   PSORIASIS 05/17/2008   Osteoarthritis 05/17/2008   STRESS INCONTINENCE 05/17/2008    Past Medical History:  Diagnosis Date   Allergy    Anxiety    Arthritis    hands, knees   Cancer ([place])    Cataract    surgery to remove -bilateral   COPD (chronic obstructive pulmonary disease) ([place])    Depression    Diabetes mellitus ([place])    a. A1c 6.6 in 05/2013 indicating new dx.   Fatty liver    Female stress incontinence    GERD (gastroesophageal reflux disease)    Heart murmur    no problems   Hemorrhoids    History of kidney stones    surgery to remove   Hyperlipidemia    Hyperplastic colon polyp 06/07/2008   Hypertension    Hypertriglyceridemia    IBS (irritable bowel syndrome)    Lung nodule    a. 21m left lung nodule by CT 05/2013.   Neuromuscular disorder (HCC)    neuropathy feet   Obesity    Psoriasis    Skin cancer     Relevant past medical, surgical, family and social history reviewed and updated as indicated. Interim medical history since our last visit reviewed.  Review of Systems Per HPI unless specifically indicated above     Objective:    BP (!) 142/82    Pulse 90    Ht _0  (1.575 m)    Wt 186 lb (84.4 kg)    SpO2 96%    BMI 34.02 kg/m   Wt Readings from Last 3 Encounters:  12/07/21 186 lb (84.4 kg)  11/28/21 186 lb (84.4 kg)  11/16/21 188 lb (85.3 kg)    Physical Exam Vitals and nursing note reviewed. Exam conducted with a chaperone present (Elizabeth Palau CMA).  Constitutional:      General: She is not in acute distress.    Appearance: Normal appearance. She is not toxic-appearing.  Genitourinary:    Labia:        Right: Tenderness present.        Left:  Tenderness present.      Vagina: No vaginal discharge, erythema or tenderness.     Comments: Bilateral labia minora erythematous and slightly excoriated; no discrete lesions or papules appreciated  Lymphadenopathy:     Lower Body: No right inguinal adenopathy. No left inguinal adenopathy.  Skin:    General: Skin is warm and dry.     Capillary Refill: Capillary refill takes less than 2 seconds.     Coloration: Skin is not jaundiced or pale.     Findings: No erythema.  Neurological:     Mental Status: She is alert and oriented to person, place, and time.  Psychiatric:        Mood and Affect: Mood normal.  Behavior: Behavior normal.        Thought Content: Thought content normal.        Judgment: Judgment normal.      Assessment & Plan:  1. Dysuria Acute.  UA today negative for acute infection.   - Urinalysis, Routine w reflex microscopic  2. Vaginal itching Acute.  Wet prep negative again today for infectious process.   - WET PREP FOR [place], YEAST, CLUE  3. Lichen sclerosus of vulva Symptoms and exam consistent with lichen sclerosis.  She does also have a history of psoriasis and uses topical clobetasol solution for that.  Discussed use of clobetasol ointment - use nightly for 6 weeks, then decrease use to 2-3 times per week.  Follow up sooner with no improvement.    - clobetasol ointment (TEMOVATE) 0.05 %; Apply 1 application topically at bedtime. Apply nightly to vulva for 6 weeks; if symptoms improve can apply 2-3 times per week.  Dispense: 60 g; Refill: 0     Follow up plan: Return for with new PCP.

## 2021-12-12 DIAGNOSIS — M5136 Other intervertebral disc degeneration, lumbar region: Secondary | ICD-10-CM | POA: Diagnosis not present

## 2021-12-12 DIAGNOSIS — G8929 Other chronic pain: Secondary | ICD-10-CM | POA: Diagnosis not present

## 2021-12-12 DIAGNOSIS — M1990 Unspecified osteoarthritis, unspecified site: Secondary | ICD-10-CM | POA: Diagnosis not present

## 2021-12-12 DIAGNOSIS — G894 Chronic pain syndrome: Secondary | ICD-10-CM | POA: Diagnosis not present

## 2021-12-30 DIAGNOSIS — G894 Chronic pain syndrome: Secondary | ICD-10-CM | POA: Diagnosis not present

## 2022-01-03 ENCOUNTER — Ambulatory Visit (HOSPITAL_COMMUNITY): Payer: Medicare Other

## 2022-01-09 ENCOUNTER — Ambulatory Visit (INDEPENDENT_AMBULATORY_CARE_PROVIDER_SITE_OTHER): Payer: Medicare Other | Admitting: Podiatry

## 2022-01-09 ENCOUNTER — Other Ambulatory Visit: Payer: Self-pay

## 2022-01-09 ENCOUNTER — Encounter: Payer: Self-pay | Admitting: Podiatry

## 2022-01-09 DIAGNOSIS — M778 Other enthesopathies, not elsewhere classified: Secondary | ICD-10-CM | POA: Diagnosis not present

## 2022-01-09 MED ORDER — DULOXETINE HCL 30 MG PO CPEP: 30.0000 mg | ORAL_CAPSULE | Freq: Every day | ORAL | 2 refills | Status: DC

## 2022-01-09 MED ORDER — TRIAMCINOLONE ACETONIDE 40 MG/ML IJ SUSP
40.0000 mg | Freq: Once | INTRAMUSCULAR | Status: AC
  Administered 2022-01-09: 40 mg

## 2022-01-09 MED ORDER — GABAPENTIN 600 MG PO TABS: ORAL_TABLET | ORAL | 2 refills | Status: DC

## 2022-01-09 NOTE — Progress Notes (Signed)
She presents today states that she wants to get off of the OxyContin that Dr. Primus Bravo has her on.  She also states that she would like to see another pain doctor possibly 1 that she could see in person for her neuropathy.  She states that Dr. Gerald Stabs she sees him primarily on the computer and she would like to see a doctor in person.  She is also complaining of pain across the dorsum of the foot today. ? ?Objective: Vital signs stable alert oriented x3 pulses are palpable.  She still has neuropathy bilateral lower extremities still has pain across the dorsum of the foot particularly around the lesser metatarsophalangeal joints. ? ?Assessment: Capsulitis diabetic peripheral neuropathy. ? ?Plan: Discussed etiology pathology and surgical therapies at this point in time until we can get her in with Dr. Davy Pique at [facility] neurosurgery for evaluation and treatment I will continue her gabapentin and her duloxetine. ? ?She takes 1200 mg of gabapentin in the morning and 1200 at night.  And she continues the duloxetine 30 mg once daily. ?

## 2022-01-15 ENCOUNTER — Telehealth: Payer: Self-pay

## 2022-01-15 DIAGNOSIS — E1142 Type 2 diabetes mellitus with diabetic polyneuropathy: Secondary | ICD-10-CM

## 2022-01-15 NOTE — Telephone Encounter (Signed)
Patient is calling about referral to neurosurgeon and had question about medications.  She request a call back please ?

## 2022-01-16 ENCOUNTER — Other Ambulatory Visit: Payer: Self-pay

## 2022-01-16 ENCOUNTER — Ambulatory Visit (INDEPENDENT_AMBULATORY_CARE_PROVIDER_SITE_OTHER): Payer: Medicare Other | Admitting: Physician Assistant

## 2022-01-16 ENCOUNTER — Encounter: Payer: Self-pay | Admitting: Physician Assistant

## 2022-01-16 ENCOUNTER — Other Ambulatory Visit: Payer: Self-pay | Admitting: Physician Assistant

## 2022-01-16 DIAGNOSIS — L219 Seborrheic dermatitis, unspecified: Secondary | ICD-10-CM | POA: Diagnosis not present

## 2022-01-16 DIAGNOSIS — D485 Neoplasm of uncertain behavior of skin: Secondary | ICD-10-CM

## 2022-01-16 DIAGNOSIS — C4492 Squamous cell carcinoma of skin, unspecified: Secondary | ICD-10-CM

## 2022-01-16 DIAGNOSIS — D0439 Carcinoma in situ of skin of other parts of face: Secondary | ICD-10-CM

## 2022-01-16 DIAGNOSIS — D045 Carcinoma in situ of skin of trunk: Secondary | ICD-10-CM

## 2022-01-16 DIAGNOSIS — D0430 Carcinoma in situ of skin of unspecified part of face: Secondary | ICD-10-CM

## 2022-01-16 DIAGNOSIS — Z1283 Encounter for screening for malignant neoplasm of skin: Secondary | ICD-10-CM

## 2022-01-16 HISTORY — DX: Squamous cell carcinoma of skin, unspecified: C44.92

## 2022-01-16 MED ORDER — FLUCONAZOLE 200 MG PO TABS: 200.0000 mg | ORAL_TABLET | Freq: Every day | ORAL | 0 refills | Status: DC

## 2022-01-16 MED ORDER — KETOCONAZOLE 2 % EX CREA: 1.0000 "application " | TOPICAL_CREAM | Freq: Two times a day (BID) | CUTANEOUS | 6 refills | Status: DC

## 2022-01-16 MED ORDER — FLUOCINOLONE ACETONIDE BODY 0.01 % EX OIL: 1.0000 "application " | TOPICAL_OIL | Freq: Two times a day (BID) | CUTANEOUS | 5 refills | Status: AC

## 2022-01-16 NOTE — Progress Notes (Addendum)
? ?Follow-Up Visit ?  ?Subjective  ?Abigail [name] is a 71 y.o. female who presents for the following: Skin Problem (Here to check some lesion on her face and chest. She would like these removed. Also patient wants to discuss yeast. She has some in her ears. Wants something to help.  She thinks it affects her hearing loss). ? ? ?The following portions of the chart were reviewed this encounter and updated as appropriate:  Tobacco  Allergies  Meds  Problems  Med Hx  Surg Hx  Fam Hx   ?  ? ?Objective  ?Well appearing patient in no apparent distress; mood and affect are within normal limits. ? ?A full examination was performed including scalp, head, eyes, ears, nose, lips, neck, chest, axillae, abdomen, back, buttocks, bilateral upper extremities, bilateral lower extremities, hands, feet, fingers, toes, fingernails, and toenails. All findings within normal limits unless otherwise noted below. ? ?Left Ear, Right Ear ?Erythematous plaques with greasy scale.  ? ?Right Breast ?Hyperkeratotic scale with pink base  ? ? ? ? ? ? ?Left Malar Cheek ?Pink scale beside white scar ? ? ? ? ? ? ? ?Assessment & Plan  ?Seborrheic dermatitis ?Left Ear; Right Ear ? ?fluconazole (DIFLUCAN) 200 MG tablet - Left Ear, Right Ear ?Take 1 tablet (200 mg total) by mouth daily. ? ?ketoconazole (NIZORAL) 2 % cream - Left Ear, Right Ear ?Apply 1 application. topically 2 (two) times daily. ? ?Fluocinolone Acetonide Body 0.01 % OIL - Left Ear, Right Ear ?Apply 1 application. topically 2 (two) times daily. ? ?Neoplasm of uncertain behavior of skin ?Right Breast ? ?Skin / nail biopsy ?Type of biopsy: tangential   ?Informed consent: discussed and consent obtained   ?Timeout: patient name, date of birth, surgical site, and procedure verified   ?Anesthesia: the lesion was anesthetized in a standard fashion   ?Anesthetic:  1% lidocaine w/ epinephrine 1-100,000 local infiltration ?Instrument used: flexible razor blade   ?Hemostasis achieved with:  ferric subsulfate   ?Outcome: patient tolerated procedure well   ?Post-procedure details: wound care instructions given   ? ?Specimen 1 - Surgical pathology ?Differential Diagnosis: scc vs bcc ? ?Check Margins: No ? ?Carcinoma in situ of skin of face, unspecified location ?Left Malar Cheek ? ?Skin / nail biopsy ?Type of biopsy: tangential   ?Informed consent: discussed and consent obtained   ?Timeout: patient name, date of birth, surgical site, and procedure verified   ?Anesthesia: the lesion was anesthetized in a standard fashion   ?Anesthetic:  1% lidocaine w/ epinephrine 1-100,000 local infiltration ?Instrument used: flexible razor blade   ?Hemostasis achieved with: ferric subsulfate   ?Outcome: patient tolerated procedure well   ?Post-procedure details: wound care instructions given   ? ?Destruction of lesion ?Complexity: simple   ?Destruction method: electrodesiccation and curettage   ?Informed consent: discussed and consent obtained   ?Timeout:  patient name, date of birth, surgical site, and procedure verified ?Anesthesia: the lesion was anesthetized in a standard fashion   ?Anesthetic:  1% lidocaine w/ epinephrine 1-100,000 local infiltration ?Curettage performed in three different directions: Yes   ?Curettage cycles:  3 ?Margin per side (cm):  0.1 ?Final wound size (cm):  1 ?Hemostasis achieved with:  aluminum chloride ?Outcome: patient tolerated procedure well with no complications   ?Post-procedure details: wound care instructions given   ? ?Specimen 2 - Surgical pathology ?Differential Diagnosis: scc vs bcc ?txpbx ?Check Margins: No ? ? ?No atypical nevi noted at the time of the visit. ? ?I, Jaramie Bastos,  PA-C, have reviewed all documentation's for this visit.  The documentation on 01/29/22 for the exam, diagnosis, procedures and orders are all accurate and complete. ?

## 2022-01-16 NOTE — Patient Instructions (Signed)

## 2022-01-16 NOTE — Telephone Encounter (Signed)
Referral sent to [facility] ? ?L/M for patient to call with questions regarding medication ?

## 2022-01-21 ENCOUNTER — Encounter: Payer: Self-pay | Admitting: Physician Assistant

## 2022-01-21 ENCOUNTER — Telehealth: Payer: Self-pay | Admitting: Physician Assistant

## 2022-01-21 ENCOUNTER — Telehealth: Payer: Self-pay | Admitting: *Deleted

## 2022-01-21 NOTE — Telephone Encounter (Signed)
Left patient a message to call back for pathology results. ?

## 2022-01-21 NOTE — Telephone Encounter (Signed)
-----   Message from Warren Danes, [place] sent at 01/21/2022 12:47 PM EDT ----- ?Cheek was treated. 30  ?

## 2022-01-21 NOTE — Telephone Encounter (Signed)
Patient returning your call for Bx results. ?

## 2022-01-21 NOTE — Telephone Encounter (Signed)
Patient calling to ask how to use medication's Kelli sent to the pharmacy. She is confused on what she should be applying to Bx site on her chest. She states that it is red around it and has minimal discharge. She states that she was told to put Vaseline on area but is she supposed to be putting anything else on that area? ?

## 2022-01-21 NOTE — Telephone Encounter (Signed)
Phone call to patient to give her clarifications on how to use the medications that [facility] prescribed for her. Clarification given to the patient.  ?

## 2022-01-21 NOTE — Progress Notes (Addendum)
? ?  Follow-Up Visit ?  ?Subjective  ?Abigail [name] is a 71 y.o. female who presents for the following: No chief complaint on file.. ? ? ?The following portions of the chart were reviewed this encounter and updated as appropriate:  Tobacco  Allergies  Meds  Problems  Med Hx  Surg Hx  Fam Hx   ?  ? ?Objective  ?Well appearing patient in no apparent distress; mood and affect are within normal limits. ? ?All skin waist up examined. ? ? ?Assessment & Plan  ? ? ?I, Aleira Deiter, PA-C, have reviewed all documentation's for this visit.  The documentation on 01/29/22 for the exam, diagnosis, procedures and orders are all accurate and complete. ?

## 2022-01-22 ENCOUNTER — Telehealth: Payer: Self-pay

## 2022-01-22 NOTE — Telephone Encounter (Signed)
-----   Message from Warren Danes, [place] sent at 01/21/2022 12:47 PM EDT ----- ?Cheek was treated. 30  ?

## 2022-01-22 NOTE — Telephone Encounter (Signed)
Phone call from patient returning our call. Patient's pathology results given to her.  ?

## 2022-01-22 NOTE — Telephone Encounter (Signed)
Left message to call.

## 2022-01-22 NOTE — Telephone Encounter (Signed)
Patient is returning a phone call from yesterday for results. ?

## 2022-01-24 ENCOUNTER — Ambulatory Visit (INDEPENDENT_AMBULATORY_CARE_PROVIDER_SITE_OTHER): Payer: Medicare Other | Admitting: Family Medicine

## 2022-01-24 ENCOUNTER — Other Ambulatory Visit: Payer: Self-pay

## 2022-01-24 ENCOUNTER — Encounter: Payer: Self-pay | Admitting: Family Medicine

## 2022-01-24 VITALS — BP 150/82 | HR 76 | Temp 98.2°F | Ht 63.25 in | Wt 172.1 lb

## 2022-01-24 DIAGNOSIS — E1142 Type 2 diabetes mellitus with diabetic polyneuropathy: Secondary | ICD-10-CM | POA: Diagnosis not present

## 2022-01-24 DIAGNOSIS — I10 Essential (primary) hypertension: Secondary | ICD-10-CM

## 2022-01-24 DIAGNOSIS — T148XXD Other injury of unspecified body region, subsequent encounter: Secondary | ICD-10-CM | POA: Insufficient documentation

## 2022-01-24 DIAGNOSIS — E1149 Type 2 diabetes mellitus with other diabetic neurological complication: Secondary | ICD-10-CM | POA: Diagnosis not present

## 2022-01-24 DIAGNOSIS — T148XXA Other injury of unspecified body region, initial encounter: Secondary | ICD-10-CM

## 2022-01-24 MED ORDER — OZEMPIC (0.25 OR 0.5 MG/DOSE) 2 MG/1.5ML ~~LOC~~ SOPN: 0.2500 mg | PEN_INJECTOR | SUBCUTANEOUS | 1 refills | Status: DC

## 2022-01-24 MED ORDER — MUPIROCIN 2 % EX OINT: 1.0000 "application " | TOPICAL_OINTMENT | Freq: Two times a day (BID) | CUTANEOUS | 0 refills | Status: DC

## 2022-01-24 NOTE — Assessment & Plan Note (Signed)
Not at goal. Cont lisinopril 10 mg. Return 3 months. Keep log at home, check in with pharmacist.  ?

## 2022-01-24 NOTE — Assessment & Plan Note (Addendum)
Planning to see neurology. Taking cymbalta 30 mg ?

## 2022-01-24 NOTE — Patient Instructions (Addendum)
Wound ?- continue Vaseline if you would like ?- if no change by next Monday, would recommend starting the antibiotic ointment ?- if no change with the antibiotic make a follow-up visit ? ?Diabetes ?- Keep working on diet ?- Goal Fasting <140 ?- Goal 2 hours after meal <180 ?- start ozempic ?- meet with pharmacist ?- See me in 3 months ? ?#Blood pressure ?- check your blood pressure at home ?- bring to your next visit with me and with pharmacist ?- Continue lisinopril 10 mg ? ?

## 2022-01-24 NOTE — Assessment & Plan Note (Signed)
Possible mild wound infection from what looks like a large shave biopsy. Mupirocin ointment if not improving in a few days. Return if worsening.  ?

## 2022-01-24 NOTE — Assessment & Plan Note (Signed)
Lab Results  ?Component Value Date  ? HGBA1C 7.9 (H) 11/16/2021  ? ?Poorly controlled and suspect worse with home CBGs>200. Intolerant of metformin XR. Stop metformin. Start ozempic 0.25 mg. See pharmacist in 1 month for check in. Plan to increase as tolerated.She declined nutrition referral. Return 3 months.  ?

## 2022-01-24 NOTE — Progress Notes (Signed)
? ?Subjective:  ? ?  ?Abigail [name] is a 71 y.o. female presenting for Establish Care (Does not have an eye dr. ) and Medication Problem (Wants an alternative to metformin ) ?  ? ? ?HPI ? ?#Diabetes ?Currently taking metformin (Glucophage, [place])  ?Using medications without difficulties: no - diarrhea ?Hypoglycemic episodes:No  ?Hyperglycemic episodes:Yes  ?Feet problems:Yes  ?Blood Sugars averaging: 235 yesterday AM ?Last HgbA1c:  ?Lab Results  ?Component Value Date  ? HGBA1C 7.9 (H) 11/16/2021  ? ? ?Diabetes Health Maintenance Due:  ?  ?Diabetes Health Maintenance Due  ?Topic Date Due  ? OPHTHALMOLOGY EXAM  Never done  ? FOOT EXAM  12/01/2021  ? HEMOGLOBIN A1C  05/16/2022  ? ?Drinking more water, eating different but not following a diabetic  ?Never went to the diabetes education/nutritionist  ?Bad habit is bread and sodas - trying to get away from these ?Sometimes glucometer reads "High" ? ?Tried trulicity - cannot remember why she didn't like it ? ?Metformin - has been on a for a while, causing diarrhea ? ? ?#skin biospy ?- poor healing ?- told it was cancer ? ?#HTN ?- does not check at home ?- taking lisinopril ?- no cp, sob ? ? ?Review of Systems ? ? ?Social History  ? ?Tobacco Use  ?Smoking Status Never  ?Smokeless Tobacco Never  ? ? ? ?   ?Objective:  ?  ?BP Readings from Last 3 Encounters:  ?01/24/22 (!) 150/82  ?12/07/21 (!) 142/82  ?11/28/21 128/80  ? ?Wt Readings from Last 3 Encounters:  ?01/24/22 172 lb 2 oz (78.1 kg)  ?12/07/21 186 lb (84.4 kg)  ?11/28/21 186 lb (84.4 kg)  ? ? ?BP (!) 150/82   Pulse 76   Temp 98.2 ?F (36.8 ?C) (Oral)   Ht 5' 3.25" (1.607 m)   Wt 172 lb 2 oz (78.1 kg)   SpO2 95%   BMI 30.25 kg/m?  ? ? ?Physical Exam ?Constitutional:   ?   General: She is not in acute distress. ?   Appearance: She is well-developed. She is not diaphoretic.  ?HENT:  ?   Right Ear: External ear normal.  ?   Left Ear: External ear normal.  ?Eyes:  ?   Conjunctiva/sclera: Conjunctivae normal.   ?Cardiovascular:  ?   Rate and Rhythm: Normal rate and regular rhythm.  ?   Heart sounds: No murmur heard. ?Pulmonary:  ?   Effort: Pulmonary effort is normal. No respiratory distress.  ?   Breath sounds: Normal breath sounds. No wheezing.  ?Musculoskeletal:  ?   Cervical back: Neck supple.  ?Skin: ?   General: Skin is warm and dry.  ?   Capillary Refill: Capillary refill takes less than 2 seconds.  ?Neurological:  ?   Mental Status: She is alert. Mental status is at baseline.  ?Psychiatric:     ?   Mood and Affect: Mood normal.     ?   Behavior: Behavior normal.  ? ? ? ? ? ?   ?Assessment & Plan:  ? ?Problem List Items Addressed This Visit   ? ?  ? Cardiovascular and Mediastinum  ? Essential hypertension - Primary  ?  Not at goal. Cont lisinopril 10 mg. Return 3 months. Keep log at home, check in with pharmacist.  ?  ?  ? Relevant Orders  ? AMB Referral to [place]  ?  ? Endocrine  ? Type 2 diabetes mellitus with neurological complications ([place])  ?  Lab Results  ?  Component Value Date  ? HGBA1C 7.9 (H) 11/16/2021  ?Poorly controlled and suspect worse with home CBGs>200. Intolerant of metformin XR. Stop metformin. Start ozempic 0.25 mg. See pharmacist in 1 month for check in. Plan to increase as tolerated.She declined nutrition referral. Return 3 months.  ?  ?  ? Relevant Medications  ? Semaglutide,0.25 or 0.'5MG'$ /DOS, (OZEMPIC, 0.25 OR 0.5 MG/DOSE,) 2 MG/1.5ML SOPN  ? Diabetic neuropathy ([place])  ?  Planning to see neurology. Taking cymbalta 30 mg ?  ?  ? Relevant Medications  ? Semaglutide,0.25 or 0.'5MG'$ /DOS, (OZEMPIC, 0.25 OR 0.5 MG/DOSE,) 2 MG/1.5ML SOPN  ? Other Relevant Orders  ? AMB Referral to [place]  ?  ? Other  ? Wound healing, delayed  ?  Possible mild wound infection from what looks like a large shave biopsy. Mupirocin ointment if not improving in a few days. Return if worsening.  ?  ?  ? Relevant Medications  ? mupirocin ointment (BACTROBAN) 2 %  ? ? ? ?Return in about 3  months (around 04/26/2022) for diabetes and htn. ? ?Lesleigh Noe, MD ? ?This visit occurred during the SARS-CoV-2 public health emergency.  Safety protocols were in place, including screening questions prior to the visit, additional usage of staff PPE, and extensive cleaning of exam room while observing appropriate contact time as indicated for disinfecting solutions.  ? ?

## 2022-01-25 ENCOUNTER — Telehealth: Payer: Self-pay | Admitting: Family Medicine

## 2022-01-25 NOTE — Chronic Care Management (AMB) (Signed)
?  Chronic Care Management  ? ?Note ? ?01/25/2022 ?Name: RHYLEI MCQUAIG MRN: [id number] DOB: Nov 10, 1950 ? ?ALNITA AYBAR is a 71 y.o. year old female who is a primary care patient of Einar Pheasant, Jobe Marker, MD. I reached out to [name] by phone today in response to a referral sent by Ms. Kristine Royal Babula's PCP, Lesleigh Noe, MD.  ? ?Ms. Milan was given information about Chronic Care Management services today including:  ?CCM service includes personalized support from designated clinical staff supervised by her physician, including individualized plan of care and coordination with other care providers ?24/7 contact phone numbers for assistance for urgent and routine care needs. ?Service will only be billed when office clinical staff spend 20 minutes or more in a month to coordinate care. ?Only one practitioner may furnish and bill the service in a calendar month. ?The patient may stop CCM services at any time (effective at the end of the month) by phone call to the office staff. ? ? ?Patient agreed to services and verbal consent obtained.  ? ?Follow up plan: ? ? ?Tatjana Dellinger ?Upstream Scheduler  ?

## 2022-01-29 DIAGNOSIS — G894 Chronic pain syndrome: Secondary | ICD-10-CM | POA: Diagnosis not present

## 2022-02-04 NOTE — Patient Instructions (Addendum)
Ms. Shamoon , ?Thank you for taking time to come for your Medicare Wellness Visit. I appreciate your ongoing commitment to your health goals. Please review the following plan we discussed and let me know if I can assist you in the future.  ? ?Screening recommendations/referrals: ?Colonoscopy: Done 06/05/2018 - Repeat in 5 years ?Mammogram: Done 12/18/2020 - Repeat annually *DUE - make appointment after you are better ?Bone Density: Done 01/22/2018 - Repeat in 5 years ?Recommended yearly ophthalmology/optometry visit for glaucoma screening and checkup ?Recommended yearly dental visit for hygiene and checkup ? ?Vaccinations: ?Influenza vaccine: Declined - recommended every fall ?Pneumococcal vaccine: Done 04/02/2017 & 01/19/2018 ?Tdap vaccine: Declined - recommended every 10 years ?Shingles vaccine: Declined - Shingrix is 2 doses 2-6 months apart and over 90% effective     ?Covid-19: Declined - recommend 2 doses one month apart plus a booster 6 months later ? ?Advanced directives: Advance directive discussed with you today. Even though you declined this today, please call our office should you change your mind, and we can give you the proper paperwork for you to fill out.  ? ?Conditions/risks identified: Aim for 30 minutes of exercise or brisk walking, 6-8 glasses of water, and 5 servings of fruits and vegetables each day. Try to make an appointment for a routine diabetic eye exam soon - see important reasons at the end of this summary ? ?Next appointment: Follow up in one year for your annual wellness visit  ? ? ?Preventive Care 23 Years and Older, Female ?Preventive care refers to lifestyle choices and visits with your health care provider that can promote health and wellness. ?What does preventive care include? ?A yearly physical exam. This is also called an annual well check. ?Dental exams once or twice a year. ?Routine eye exams. Ask your health care provider how often you should have your eyes checked. ?Personal  lifestyle choices, including: ?Daily care of your teeth and gums. ?Regular physical activity. ?Eating a healthy diet. ?Avoiding tobacco and drug use. ?Limiting alcohol use. ?Practicing safe sex. ?Taking low-dose aspirin every day. ?Taking vitamin and mineral supplements as recommended by your health care provider. ?What happens during an annual well check? ?The services and screenings done by your health care provider during your annual well check will depend on your age, overall health, lifestyle risk factors, and family history of disease. ?Counseling  ?Your health care provider may ask you questions about your: ?Alcohol use. ?Tobacco use. ?Drug use. ?Emotional well-being. ?Home and relationship well-being. ?Sexual activity. ?Eating habits. ?History of falls. ?Memory and ability to understand (cognition). ?Work and work Statistician. ?Reproductive health. ?Screening  ?You may have the following tests or measurements: ?Height, weight, and BMI. ?Blood pressure. ?Lipid and cholesterol levels. These may be checked every 5 years, or more frequently if you are over 24 years old. ?Skin check. ?Lung cancer screening. You may have this screening every year starting at age 16 if you have a 30-pack-year history of smoking and currently smoke or have quit within the past 15 years. ?Fecal occult blood test (FOBT) of the stool. You may have this test every year starting at age 14. ?Flexible sigmoidoscopy or colonoscopy. You may have a sigmoidoscopy every 5 years or a colonoscopy every 10 years starting at age 61. ?Hepatitis C blood test. ?Hepatitis B blood test. ?Sexually transmitted disease (STD) testing. ?Diabetes screening. This is done by checking your blood sugar (glucose) after you have not eaten for a while (fasting). You may have this done every 1-3 years. ?  Bone density scan. This is done to screen for osteoporosis. You may have this done starting at age 80. ?Mammogram. This may be done every 1-2 years. Talk to your  health care provider about how often you should have regular mammograms. ?Talk with your health care provider about your test results, treatment options, and if necessary, the need for more tests. ?Vaccines  ?Your health care provider may recommend certain vaccines, such as: ?Influenza vaccine. This is recommended every year. ?Tetanus, diphtheria, and acellular pertussis (Tdap, Td) vaccine. You may need a Td booster every 10 years. ?Zoster vaccine. You may need this after age 62. ?Pneumococcal 13-valent conjugate (PCV13) vaccine. One dose is recommended after age 35. ?Pneumococcal polysaccharide (PPSV23) vaccine. One dose is recommended after age 105. ?Talk to your health care provider about which screenings and vaccines you need and how often you need them. ?This information is not intended to replace advice given to you by your health care provider. Make sure you discuss any questions you have with your health care provider. ?Document Released: 11/17/2015 Document Revised: 07/10/2016 Document Reviewed: 08/22/2015 ?Elsevier Interactive Patient Education ? 2017 [place]. ? ?Fall Prevention in the Home ?Falls can cause injuries. They can happen to people of all ages. There are many things you can do to make your home safe and to help prevent falls. ?What can I do on the outside of my home? ?Regularly fix the edges of walkways and driveways and fix any cracks. ?Remove anything that might make you trip as you walk through a door, such as a raised step or threshold. ?Trim any bushes or trees on the path to your home. ?Use bright outdoor lighting. ?Clear any walking paths of anything that might make someone trip, such as rocks or tools. ?Regularly check to see if handrails are loose or broken. Make sure that both sides of any steps have handrails. ?Any raised decks and porches should have guardrails on the edges. ?Have any leaves, snow, or ice cleared regularly. ?Use sand or salt on walking paths during winter. ?Clean  up any spills in your garage right away. This includes oil or grease spills. ?What can I do in the bathroom? ?Use night lights. ?Install grab bars by the toilet and in the tub and shower. Do not use towel bars as grab bars. ?Use non-skid mats or decals in the tub or shower. ?If you need to sit down in the shower, use a plastic, non-slip stool. ?Keep the floor dry. Clean up any water that spills on the floor as soon as it happens. ?Remove soap buildup in the tub or shower regularly. ?Attach bath mats securely with double-sided non-slip rug tape. ?Do not have throw rugs and other things on the floor that can make you trip. ?What can I do in the bedroom? ?Use night lights. ?Make sure that you have a light by your bed that is easy to reach. ?Do not use any sheets or blankets that are too big for your bed. They should not hang down onto the floor. ?Have a firm chair that has side arms. You can use this for support while you get dressed. ?Do not have throw rugs and other things on the floor that can make you trip. ?What can I do in the kitchen? ?Clean up any spills right away. ?Avoid walking on wet floors. ?Keep items that you use a lot in easy-to-reach places. ?If you need to reach something above you, use a strong step stool that has a grab bar. ?  Keep electrical cords out of the way. ?Do not use floor polish or wax that makes floors slippery. If you must use wax, use non-skid floor wax. ?Do not have throw rugs and other things on the floor that can make you trip. ?What can I do with my stairs? ?Do not leave any items on the stairs. ?Make sure that there are handrails on both sides of the stairs and use them. Fix handrails that are broken or loose. Make sure that handrails are as long as the stairways. ?Check any carpeting to make sure that it is firmly attached to the stairs. Fix any carpet that is loose or worn. ?Avoid having throw rugs at the top or bottom of the stairs. If you do have throw rugs, attach them to the  floor with carpet tape. ?Make sure that you have a light switch at the top of the stairs and the bottom of the stairs. If you do not have them, ask someone to add them for you. ?What else can I do to help preve

## 2022-02-05 ENCOUNTER — Ambulatory Visit (INDEPENDENT_AMBULATORY_CARE_PROVIDER_SITE_OTHER): Payer: Medicare Other

## 2022-02-05 VITALS — Wt 172.0 lb

## 2022-02-05 DIAGNOSIS — G5602 Carpal tunnel syndrome, left upper limb: Secondary | ICD-10-CM | POA: Diagnosis not present

## 2022-02-05 DIAGNOSIS — Z0000 Encounter for general adult medical examination without abnormal findings: Secondary | ICD-10-CM | POA: Diagnosis not present

## 2022-02-05 DIAGNOSIS — E1142 Type 2 diabetes mellitus with diabetic polyneuropathy: Secondary | ICD-10-CM | POA: Diagnosis not present

## 2022-02-05 DIAGNOSIS — G894 Chronic pain syndrome: Secondary | ICD-10-CM | POA: Diagnosis not present

## 2022-02-05 NOTE — Progress Notes (Signed)
? ?Subjective:  ? Abigail [name] is a 71 y.o. female who presents for Medicare Annual (Subsequent) preventive examination. ? ?Virtual Visit via Telephone Note ? ?I connected with  Abigail [name] on 02/05/22 at  8:15 AM EDT by telephone and verified that I am speaking with the correct person using two identifiers. ? ?Location: ?Patient: Home ?Provider: [facility] ?Persons participating in the virtual visit: patient/Nurse Health Advisor ?  ?I discussed the limitations, risks, security and privacy concerns of performing an evaluation and management service by telephone and the availability of in person appointments. The patient expressed understanding and agreed to proceed. ? ?Interactive audio and video telecommunications were attempted between this nurse and patient, however failed, due to patient having technical difficulties OR patient did not have access to video capability.  We continued and completed visit with audio only. ? ?Some vital signs may be absent or patient reported.  ? ?Abigail Dimalanta Dionne Ano, LPN  ? ?Review of Systems    ? ?Cardiac Risk Factors include: advanced age (>52mn, >>82women);diabetes mellitus;dyslipidemia;hypertension;obesity (BMI >30kg/m2);Other (see comment), Risk factor comments: COPD, Fatty Liver ? ?   ?Objective:  ?  ?Today's Vitals  ? 02/05/22 0815 02/05/22 0816  ?Weight: 172 lb (78 kg)   ?PainSc:  3   ? ?Body mass index is 30.23 kg/m?. ? ? ?  02/05/2022  ?  8:25 AM 02/06/2017  ? 12:52 PM 06/15/2015  ?  9:25 AM 06/12/2015  ?  2:34 PM 10/06/2014  ?  8:55 AM 10/03/2014  ? 10:48 AM 05/20/2013  ?  4:59 PM  ?Advanced Directives  ?Does Patient Have a Medical Advance Directive? No No  No No No Patient does not have advance directive;Patient would not like information  ?Copy of [place] Chart?   No - copy requested No - copy requested     ?Would patient like information on creating a medical advance directive? No - Patient declined  Yes -Higher education careers advisergiven;No - patient  declined information  No - patient declined information No - patient declined information   ? ? ?Current Medications (verified) ?Outpatient Encounter Medications as of 02/05/2022  ?Medication Sig  ? albuterol (VENTOLIN HFA) 108 (90 Base) MCG/ACT inhaler Inhale 2 puffs into the lungs every 4 (four) hours as needed for wheezing or shortness of breath.  ? COSENTYX SENSOREADY, 300 MG, 150 MG/ML SOAJ INJECT TWO PENS SUBCUTANEOUSLY EVERY 4 WEEKS. REFRIGERATE. ALLOW 15 TO 30 MINUTES AT ROOM TEMP PRIOR TO ADMINISTRATION.  ? diclofenac (FLECTOR) 1.3 % PTCH   ? DULoxetine (CYMBALTA) 30 MG capsule Take 1 capsule (30 mg total) by mouth daily.  ? esomeprazole (NEXIUM) 40 MG capsule Take 1 capsule (40 mg total) by mouth 2 (two) times daily before a meal.  ? Fluocinolone Acetonide Body 0.01 % OIL Apply 1 application. topically 2 (two) times daily.  ? gabapentin (NEURONTIN) 600 MG tablet Take 2 capsules in the morning and 2 capsules at night  ? glucose blood (FREESTYLE LITE) test strip Use as instructed to monitor FSBS 1x daily. Dx: E11.9  ? glucose monitoring kit (FREESTYLE) monitoring kit 1 each by Does not apply route as needed for other. Dispense one glucometer of choice, testing strips/lancets for once per day glucose checks.  QS for 1 month, 11 refills.  ? Lancets (FREESTYLE) lancets Use as instructed  ? lisinopril (ZESTRIL) 20 MG tablet Take 0.5 tablets (10 mg total) by mouth daily.  ? Semaglutide,0.25 or 0.5MG/DOS, (OZEMPIC, 0.25 OR  0.5 MG/DOSE,) 2 MG/1.5ML SOPN Inject 0.25 mg into the skin once a week.  ? Azelastine HCl 137 MCG/SPRAY SOLN as needed. (Patient not taking: Reported on 02/05/2022)  ? clobetasol (TEMOVATE) 0.05 % external solution Apply 1 application topically 2 (two) times daily. (Patient not taking: Reported on 02/05/2022)  ? [DISCONTINUED] clobetasol ointment (TEMOVATE) 0.45 % Apply 1 application topically at bedtime. Apply nightly to vulva for 6 weeks; if symptoms improve can apply 2-3 times per week. (Patient  not taking: Reported on 02/05/2022)  ? [DISCONTINUED] fluconazole (DIFLUCAN) 200 MG tablet Take 1 tablet (200 mg total) by mouth daily. (Patient not taking: Reported on 02/05/2022)  ? [DISCONTINUED] ketoconazole (NIZORAL) 2 % cream Apply 1 application. topically 2 (two) times daily. (Patient not taking: Reported on 02/05/2022)  ? [DISCONTINUED] mupirocin ointment (BACTROBAN) 2 % Apply 1 application. topically 2 (two) times daily. (Patient not taking: Reported on 02/05/2022)  ? ?No facility-administered encounter medications on file as of 02/05/2022.  ? ? ?Allergies (verified) ?Cinnamon and Statins  ? ?History: ?Past Medical History:  ?Diagnosis Date  ? Allergy   ? Anxiety   ? Arthritis   ? hands, knees  ? Cancer [facility])   ? Cataract   ? surgery to remove -bilateral  ? COPD (chronic obstructive pulmonary disease) ([facility])   ? Depression   ? Diabetes mellitus ([place])   ? a. A1c 6.6 in 05/2013 indicating new dx.  ? Fatty liver   ? Female stress incontinence   ? GERD (gastroesophageal reflux disease)   ? Heart murmur   ? no problems  ? Hemorrhoids   ? History of kidney stones   ? surgery to remove  ? Hyperlipidemia   ? Hyperplastic colon polyp 06/07/2008  ? Hypertension   ? Hypertriglyceridemia   ? IBS (irritable bowel syndrome)   ? Lung nodule   ? a. 80m left lung nodule by CT 05/2013.  ? Neuromuscular disorder ([place]   ? neuropathy feet  ? Obesity   ? Psoriasis   ? Skin cancer   ? ?Past Surgical History:  ?Procedure Laterality Date  ? CATARACT EXTRACTION W/PHACO Left 10/06/2014  ? Procedure: CATARACT EXTRACTION PHACO AND INTRAOCULAR LENS PLACEMENT LEFT EYE;  Surgeon: KTonny Branch MD;  Location: [facility];  Service: Ophthalmology;  Laterality: Left;  CDE 7.35  ? CATARACT EXTRACTION W/PHACO Right 11/14/2014  ? Procedure: CATARACT EXTRACTION PHACO AND INTRAOCULAR LENS PLACEMENT RIGHT EYE;  Surgeon: KTonny Branch MD;  Location: [facility];  Service: Ophthalmology;  Laterality: Right;  CDE:6.39  ? COLONOSCOPY  06/2008  ? hx polyps  ? CYSTOSCOPY W/  RETROGRADES Right 01/21/2017  ? Procedure: CYSTOSCOPY WITH RETROGRADE PYELOGRAM;  Surgeon: AHollice Espy MD;  Location: [facility];  Service: Urology;  Laterality: Right;  ? CYSTOSCOPY/URETEROSCOPY/HOLMIUM LASER/STENT PLACEMENT Left 01/21/2017  ? Procedure: CYSTOSCOPY/URETEROSCOPY/HOLMIUM LASER/STENT PLACEMENT;  Surgeon: AHollice Espy MD;  Location: [facility];  Service: Urology;  Laterality: Left;  ? DORSAL COMPARTMENT RELEASE Left 06/15/2015  ? Procedure: LEFT WRIST DEQUERVAINS;  Surgeon: DNinetta Lights MD;  [place] [facility]  Service: Orthopedics;  Laterality: Left;  ? HERNIA REPAIR    ? MOUTH SURGERY    ? tooth ext  ? NASAL SINUS SURGERY    ? SHOULDER SURGERY    ? right  ? TONSILLECTOMY    ? UPPER GI ENDOSCOPY    ? normal per patient  ? VENTRAL HERNIA REPAIR    ? ?Family History  ?Problem Relation Age of Onset  ? Breast cancer Mother   ?  Colon polyps Maternal Aunt   ? Heart disease Maternal Grandfather   ? Kidney cancer Neg Hx   ? Bladder Cancer Neg Hx   ? Colon cancer Neg Hx   ? Stomach cancer Neg Hx   ? Rectal cancer Neg Hx   ? Pancreatic cancer Neg Hx   ? Esophageal cancer Neg Hx   ? ?Social History  ? ?Socioeconomic History  ? Marital status: Married  ?  Spouse name: [name]  ? Number of children: 2  ? Years of education: trade school  ? Highest education level: Not on file  ?Occupational History  ? Occupation: retired  ?Tobacco Use  ? Smoking status: Never  ? Smokeless tobacco: Never  ?Vaping Use  ? Vaping Use: Never used  ?Substance and Sexual Activity  ? Alcohol use: No  ? Drug use: No  ? Sexual activity: Yes  ?  Birth control/protection: Post-menopausal  ?Other Topics Concern  ? Not on file  ?Social History Narrative  ? 01/24/22  ? From: the area  ? Living: with [name], husband [phone])  ? Work: retired - school bus drivver  ?   ? Family: 2 children - [name] and [name] - 4 grandchildren  ?   ? Enjoys: farm life, play with grandchildren, travel  ?   ? Exercise: stairs at home 20 times a  day, walking the property  ? Diet: not following diabetic diet but trying to do better  ?   ? Safety  ? Seat belts: Yes   ? Guns: Yes  and secure  ? Safe in relationships: Yes   ?   ? ?Social Determinants of

## 2022-02-12 ENCOUNTER — Telehealth: Payer: Self-pay

## 2022-02-12 DIAGNOSIS — E1142 Type 2 diabetes mellitus with diabetic polyneuropathy: Secondary | ICD-10-CM

## 2022-02-12 MED ORDER — OZEMPIC (0.25 OR 0.5 MG/DOSE) 2 MG/1.5ML ~~LOC~~ SOPN: 0.5000 mg | PEN_INJECTOR | SUBCUTANEOUS | 0 refills | Status: DC

## 2022-02-12 NOTE — Telephone Encounter (Signed)
Patient was started on Ozempic 3 weeks ago. She has not had any issues with side effects. She has not had much change in blood sugar readings running between 190 -200. She has been working on diet. She is not taking anything in addition for blood sugars. She d/c metformin when she started Ozempic. Will be taking 4th injection Friday. She was not sure if you wanted to increase dose.  ?

## 2022-02-12 NOTE — Telephone Encounter (Signed)
Increase ozempic to 0.5 mg weekly  ? ?This will be the same pen she is currently using, but refill sent to pharmacy.  ?

## 2022-02-12 NOTE — Addendum Note (Signed)
Addended by: Waunita Schooner R on: 02/12/2022 03:40 PM ? ? Modules accepted: Orders ? ?

## 2022-02-13 NOTE — Telephone Encounter (Signed)
Left detailed VM (DPR) notifying pt of the increase and refill sent in.  ?

## 2022-02-14 ENCOUNTER — Telehealth: Payer: Self-pay

## 2022-02-14 NOTE — Telephone Encounter (Signed)
New message    Returning call back to the [facility] from yesterday.

## 2022-02-14 NOTE — Telephone Encounter (Signed)
Called pt and left another Vm for pt.  ?

## 2022-02-15 MED ORDER — FREESTYLE LITE TEST VI STRP: ORAL_STRIP | 1 refills | Status: DC

## 2022-02-15 NOTE — Telephone Encounter (Signed)
Pt called back I advised her of Dr. Verda Cumins comments. She said she did get Christy's message with that info but she was calling back because she needed a refill of her test strips  ?

## 2022-02-26 ENCOUNTER — Ambulatory Visit: Payer: Medicare Other | Admitting: Physician Assistant

## 2022-02-27 ENCOUNTER — Ambulatory Visit (INDEPENDENT_AMBULATORY_CARE_PROVIDER_SITE_OTHER): Payer: Medicare Other | Admitting: Physician Assistant

## 2022-02-27 ENCOUNTER — Telehealth: Payer: Medicare Other

## 2022-02-27 ENCOUNTER — Encounter: Payer: Self-pay | Admitting: Physician Assistant

## 2022-02-27 DIAGNOSIS — Z79899 Other long term (current) drug therapy: Secondary | ICD-10-CM | POA: Diagnosis not present

## 2022-02-27 DIAGNOSIS — L409 Psoriasis, unspecified: Secondary | ICD-10-CM | POA: Diagnosis not present

## 2022-02-27 DIAGNOSIS — D045 Carcinoma in situ of skin of trunk: Secondary | ICD-10-CM

## 2022-02-27 DIAGNOSIS — L4050 Arthropathic psoriasis, unspecified: Secondary | ICD-10-CM

## 2022-02-27 NOTE — Patient Instructions (Signed)

## 2022-02-27 NOTE — Progress Notes (Addendum)
   Follow-Up Visit   Subjective  Abigail [name] is a 71 y.o. female who presents for the following: Procedure (Patient here today for treatment of CIS x 1 right breast. Patient is also due a TB Gold for refills on her Cosentyx for her Psoriasis. ). Her psoriasis is clear with cosentyx. The CIS on her face was treated and is healing well.    The following portions of the chart were reviewed this encounter and updated as appropriate:  Tobacco  Allergies  Meds  Problems  Med Hx  Surg Hx  Fam Hx      Objective  Well appearing patient in no apparent distress; mood and affect are within normal limits.  All skin waist up examined.  back, hips, knees, feet and hands Tender,swollen joints. No tenosynovitis.  Gluteal Crease, Left Dorsal Hand, Left Elbow - Posterior, Right Dorsal Hand, Right Elbow - Posterior Well-marginated erythematous papules/plaques with silvery scale.   Right Breast Lesion identified by Robyne Askew and nurse in room.     Assessment & Plan  Psoriasis Left Elbow - Posterior; Right Elbow - Posterior; Gluteal Crease; Left Dorsal Hand; Right Dorsal Hand  Continue Cosentyx order given today for yearly TB Gold.   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  Destruction of lesion Complexity: simple   Destruction method: electrodesiccation and curettage   Informed consent: discussed and consent obtained   Timeout:  patient name, date of birth, surgical site, and procedure verified Anesthesia: the lesion was anesthetized in a standard fashion   Anesthetic:  1% lidocaine w/ epinephrine 1-100,000 local infiltration Curettage performed in three different directions: Yes   Electrodesiccation performed over the curetted area: Yes   Curettage cycles:  3 Final wound size  (cm):  2 Hemostasis achieved with:  ferric subsulfate Outcome: patient tolerated procedure well with no complications   Additional details:  Wound innoculated with 5 fluorouracil solution.  Psoriatic arthritis ([place]) back, hips, knees, feet and hands  Continue Cosentyx as it has decreased her symptoms over time.     I, Leodis Alcocer, PA-C, have reviewed all documentation's for this visit.  The documentation on 03/21/22 for the exam, diagnosis, procedures and orders are all accurate and complete.

## 2022-02-28 DIAGNOSIS — G894 Chronic pain syndrome: Secondary | ICD-10-CM | POA: Diagnosis not present

## 2022-03-01 LAB — QUANTIFERON-TB GOLD PLUS
Mitogen-NIL: 8.34 IU/mL
NIL: 0.01 IU/mL
QuantiFERON-TB Gold Plus: NEGATIVE
TB1-NIL: 0 IU/mL
TB2-NIL: 0 IU/mL

## 2022-03-05 ENCOUNTER — Other Ambulatory Visit: Payer: Self-pay

## 2022-03-05 DIAGNOSIS — L409 Psoriasis, unspecified: Secondary | ICD-10-CM

## 2022-03-05 MED ORDER — COSENTYX SENSOREADY (300 MG) 150 MG/ML ~~LOC~~ SOAJ: SUBCUTANEOUS | 10 refills | Status: DC

## 2022-03-07 ENCOUNTER — Telehealth: Payer: Self-pay

## 2022-03-07 NOTE — Chronic Care Management (AMB) (Signed)
? ? ?Chronic Care Management ?Pharmacy Assistant  ? ?Name: Abigail [name]  MRN: [id number] DOB: 09-Oct-1951 ? ?Abigail [name] is an 71 y.o. year old female who presents for his initial CCM visit with the clinical pharmacist. ? ?Reason for Encounter: Initial Questions ?  ?Conditions to be addressed/monitored: ?HTN, HLD, COPD, and DMII ? ? ?Recent office visits:  ?02/05/22-Amy Hopkins,LPN(fam.med) Telemedicine-[facility]-discussed screenings,vaccines,exercise. ?01/24/22-Jessica Cody,MD(PCP)-f/u diarrhea with metformin-stop metformin,Start Ozempic-once weekly injection-apply mupirocin ointment to mild wound. ?12/07/21-Jessica Martinez,NP([facility])-dysuria,vaginal itching,UA,start clobetasol ointment (TEMOVATE) 0.05 %; Apply 1 application topically at bedtime. Apply nightly to vulva for 6 weeks; if symptoms improve can apply 2-3 times per week.  ?11/28/21-Jessica Martinez,NP([facility])-f/u cough,chest XR, Start doxycycline 170m,Last A1c 7.9%.  Patient agreeable to GLP-1a therapy.  Start Victoza and continue Metformin. ?11/16/21-Jessica Martinez,NP([facility]) F/U DM-labs ordered,CT chest ordered ? ?Recent consult visits:  ?02/27/22-Kelli Sheffield,PA(Derm)-f/u psoriasis-destruction of lesion-no medication changes. ?01/16/22-Kelli Sheffield,PA(Derm)-destruction of lesion-no medication changes ?01/09/22-Max Hyatt,DPM-foot injection triamcinolone Acetonide 449m?12/30/21-Gregory Crisp,MD(anesthesiology) no data found ?10/02/21-Kaitha Nandigam,MD(gastroenterology)f/u GERD,dysphagia-switch to dexilant 6034make 1 tablet daily,Use Carafate before meals and at bedtime as needed ? ? ? ?Hospital visits:  ?None in previous 6 months ? ?Medications: ?Outpatient Encounter Medications as of 03/07/2022  ?Medication Sig  ? albuterol (VENTOLIN HFA) 108 (90 Base) MCG/ACT inhaler Inhale 2 puffs into the lungs every 4 (four) hours as needed for wheezing or shortness of breath.  ? Azelastine HCl 137 MCG/SPRAY SOLN as needed. (Patient not taking:  Reported on 02/05/2022)  ? clobetasol (TEMOVATE) 0.05 % external solution Apply 1 application topically 2 (two) times daily. (Patient not taking: Reported on 02/05/2022)  ? COSENTYX SENSOREADY, 300 MG, 150 MG/ML SOAJ Inject 300m82mbcutaneously every 4 weeks.  ? diclofenac (FLECTOR) 1.3 % PTCH   ? DULoxetine (CYMBALTA) 30 MG capsule Take 1 capsule (30 mg total) by mouth daily.  ? esomeprazole (NEXIUM) 40 MG capsule Take 1 capsule (40 mg total) by mouth 2 (two) times daily before a meal.  ? Fluocinolone Acetonide Body 0.01 % OIL Apply 1 application. topically 2 (two) times daily.  ? gabapentin (NEURONTIN) 600 MG tablet Take 2 capsules in the morning and 2 capsules at night  ? glucose blood (FREESTYLE LITE) test strip Use as instructed to monitor FSBS 1x daily. Dx: E11.9  ? glucose monitoring kit (FREESTYLE) monitoring kit 1 each by Does not apply route as needed for other. Dispense one glucometer of choice, testing strips/lancets for once per day glucose checks.  QS for 1 month, 11 refills.  ? Lancets (FREESTYLE) lancets Use as instructed  ? lisinopril (ZESTRIL) 20 MG tablet Take 0.5 tablets (10 mg total) by mouth daily.  ? Semaglutide,0.25 or 0.5MG/DOS, (OZEMPIC, 0.25 OR 0.5 MG/DOSE,) 2 MG/1.5ML SOPN Inject 0.5 mg into the skin once a week.  ? ?No facility-administered encounter medications on file as of 03/07/2022.  ? ? ? ?Lab Results  ?Component Value Date/Time  ? HGBA1C 7.9 (H) 11/16/2021 10:30 AM  ? HGBA1C 6.0 (H) 07/02/2021 12:24 PM  ? MICROALBUR 0.3 11/16/2021 10:30 AM  ? MICROALBUR 0.6 12/01/2020 11:19 AM  ?  ? ?BP Readings from Last 3 Encounters:  ?01/24/22 (!) 150/82  ?12/07/21 (!) 142/82  ?11/28/21 128/80  ? ? ? ? ?Patient contacted to confirm telephone appointment with LindCharlene Brookearm D, on 03/12/22 at 1:30pm. ? ?Do you have any problems getting your medications? No ? ?What is your top health concern you would like to discuss at your upcoming visit? No main concern identified ? ?  Have you seen any other  providers since your last visit with PCP? Yes Dermatology ? ? ? ?Star Rating Drugs:  ?Medication:  Last Fill: Day Supply ?Lisinopril 66m 11/16/21 90 ?Ozempic 0.533m4/11/23 28 ? ? ?Care Gaps: ?Annual wellness visit in last year? Yes ?Most Recent BP reading:150/82  76-P  01/24/22 ? ?If Diabetic: ?Most recent A1C reading:7.9  11/16/21 ?Last eye exam / retinopathy screening:never done ?Last diabetic foot exam:2022 ? ?LiCharlene BrookeCPP notified ? ?Velmena Humble, CCMA ?Health concierge  ?[phone]?

## 2022-03-12 ENCOUNTER — Ambulatory Visit (INDEPENDENT_AMBULATORY_CARE_PROVIDER_SITE_OTHER): Payer: Medicare Other | Admitting: Pharmacist

## 2022-03-12 DIAGNOSIS — E1149 Type 2 diabetes mellitus with other diabetic neurological complication: Secondary | ICD-10-CM

## 2022-03-12 DIAGNOSIS — E782 Mixed hyperlipidemia: Secondary | ICD-10-CM

## 2022-03-12 DIAGNOSIS — E1142 Type 2 diabetes mellitus with diabetic polyneuropathy: Secondary | ICD-10-CM

## 2022-03-12 DIAGNOSIS — I10 Essential (primary) hypertension: Secondary | ICD-10-CM

## 2022-03-12 NOTE — Progress Notes (Signed)
? ?Chronic Care Management ?Pharmacy Note ? ?03/19/2022 ?Name:  Abigail [name] MRN:  [id number] DOB:  04-14-1951 ? ?Summary: CCM Initial visit ?-Reviewed DM: A1c 7.9% (Jan 2023), she is doing well on Ozempic 0.5 mg weekly. She does not have a complete BG log today but reports generally BG ranges 116-195 throughout the day. Ozempic is currently affordable, she will let us know if cost becomes an issue ?-Reviewed cholesterol/ASCVD risk 38.5%; she did not tolerate simvastatin or rosuvastatin due to fatigue/weakness. LDL was 168 (Aug 2022) and with DM she would benefit from statin therapy, she agreed to try low-dose pravastatin ?-Reviewed BP: pt does not have complete BP log available, reports BP has been "normal" when she checks ? ?Recommendations/Changes made from today's visit: ?-Trial of Pravastatin 10 mg daily. Repeat lipid panel at upcoming PCP visit - see phone note ?-Advised to keep a daily log of BP and BG and bring to next PCP appt ? ?Plan: ?-[facility] will call patient 2 months for DM update ?-Pharmacist follow up televisit scheduled for 3 months ?-PCP visit scheduled 05/01/22 ? ? ? ?Subjective: ?Abigail [name] is an 71 y.o. year old female who is a primary patient of Cody, Jobe Marker, MD.  The CCM team was consulted for assistance with disease management and care coordination needs.   ? ?Engaged with patient by telephone for initial visit in response to provider referral for pharmacy case management and/or care coordination services.  ? ?Consent to Services:  ?The patient was given the following information about Chronic Care Management services today, agreed to services, and gave verbal consent: 1. CCM service includes personalized support from designated clinical staff supervised by the primary care provider, including individualized plan of care and coordination with other care providers 2. 24/7 contact phone numbers for assistance for urgent and routine care needs. 3. Service will only be billed when  office clinical staff spend 20 minutes or more in a month to coordinate care. 4. Only one practitioner may furnish and bill the service in a calendar month. 5.The patient may stop CCM services at any time (effective at the end of the month) by phone call to the office staff. 6. The patient will be responsible for cost sharing (co-pay) of up to 20% of the service fee (after annual deductible is met). Patient agreed to services and consent obtained. ? ?Patient Care Team: ?Lesleigh Noe, MD as PCP - General (Family Medicine) ?Starlyn Skeans as Librarian, academic (Dermatology) ?Vasilios Ottaway, Cleaster Cor[facility], [facility] as Pharmacist (Pharmacist) ?Hyatt, Max T, DPM as Veterinary surgeon) ?Mauri Pole, MD as Consulting Physician (Gastroenterology) ?Mohammed Kindle, MD as Referring Physician (Pain Medicine) ? ?Recent office visits: ?02/05/22-Amy Hopkins,LPN(fam.med) Telemedicine-[facility]-discussed screenings,vaccines,exercise. ?01/24/22-Jessica Cody,MD(PCP)-f/u diarrhea with metformin-stop metformin,Start Ozempic-once weekly injection-apply mupirocin ointment to mild wound. ?12/07/21-Jessica Martinez,NP([facility])-dysuria,vaginal itching,UA,start clobetasol ointment (TEMOVATE) 0.05 %; Apply 1 application topically at bedtime. Apply nightly to vulva for 6 weeks; if symptoms improve can apply 2-3 times per week.  ?11/28/21-Jessica Martinez,NP([facility])-f/u cough,chest XR, Start doxycycline 174m,Last A1c 7.9%.  Patient agreeable to GLP-1a therapy.  Start Victoza and continue Metformin. ?11/16/21-Jessica Martinez,NP(Fam Med) F/U DM-labs ordered,CT chest ordered ? ?Recent consult visits: ?02/27/22-Kelli Sheffield,PA(Derm)-f/u psoriasis-destruction of lesion-no medication changes. ?01/16/22-Kelli Sheffield,PA(Derm)-destruction of lesion-no medication changes ?01/09/22-Max Hyatt,DPM-foot injection triamcinolone Acetonide 433m?12/30/21-Gregory Crisp,MD(anesthesiology) no data found ?10/02/21-Kaitha  Nandigam,MD(gastroenterology)f/u GERD,dysphagia-switch to dexilant 6036make 1 tablet daily,Use Carafate before meals and at bedtime as needed ? ?Hospital visits: ?None in previous 6 months ? ? ?  Objective: ? ?Lab Results  ?Component Value Date  ? CREATININE 0.67 11/16/2021  ? BUN 10 11/16/2021  ? GFR 114.93 12/26/2015  ? EGFR 93 11/16/2021  ? GFRNONAA 66 07/09/2019  ? GFRAA 76 07/09/2019  ? NA 141 11/16/2021  ? K 4.1 11/16/2021  ? CALCIUM 9.4 11/16/2021  ? CO2 29 11/16/2021  ? GLUCOSE 155 (H) 11/16/2021  ? ? ?Lab Results  ?Component Value Date/Time  ? HGBA1C 7.9 (H) 11/16/2021 10:30 AM  ? HGBA1C 6.0 (H) 07/02/2021 12:24 PM  ? GFR 114.93 12/26/2015 10:16 AM  ? GFR 86.92 07/23/2012 07:50 AM  ? MICROALBUR 0.3 11/16/2021 10:30 AM  ? MICROALBUR 0.6 12/01/2020 11:19 AM  ?  ?Last diabetic Eye exam: No results found for: HMDIABEYEEXA  ?Last diabetic Foot exam: No results found for: HMDIABFOOTEX  ? ?Lab Results  ?Component Value Date  ? CHOL 247 (H) 07/02/2021  ? HDL 41 (L) 07/02/2021  ? LDLCALC 168 (H) 07/02/2021  ? TRIG 222 (H) 07/02/2021  ? CHOLHDL 6.0 (H) 07/02/2021  ? ? ? ?  Latest Ref Rng & Units 07/02/2021  ? 12:24 PM 12/01/2020  ? 11:19 AM 07/09/2019  ? 12:44 PM  ?Hepatic Function  ?Total Protein 6.1 - 8.1 g/dL 6.5   6.3   7.0    ?AST 10 - 35 U/L 21   18   23     ?ALT 6 - 29 U/L 19   15   16     ?Total Bilirubin 0.2 - 1.2 mg/dL 0.6   0.5   1.1    ? ? ?Lab Results  ?Component Value Date/Time  ? TSH 0.91 04/02/2017 10:12 AM  ? TSH 0.702 05/12/2014 09:23 AM  ? ? ? ?  Latest Ref Rng & Units 07/02/2021  ? 12:24 PM 12/01/2020  ? 11:19 AM 09/23/2018  ?  9:25 AM  ?CBC  ?WBC 3.8 - 10.8 Thousand/uL 6.9   6.9   7.8    ?Hemoglobin 11.7 - 15.5 g/dL 13.8   14.2   14.0    ?Hematocrit 35.0 - 45.0 % 40.8   41.7   40.8    ?Platelets 140 - 400 Thousand/uL 189   229   219    ? ? ?Lab Results  ?Component Value Date/Time  ? VD25OH 25 (L) 06/13/2017 02:40 PM  ? ? ?Clinical ASCVD: No  ?The 10-year ASCVD risk score (Arnett DK, et al., 2019) is:  38.5% ?  Values used to calculate the score: ?    Age: 30 years ?    Sex: Female ?    Is Non-Hispanic African American: No ?    Diabetic: Yes ?    Tobacco smoker: No ?    Systolic Blood Pressure: 275 mmHg ?    Is BP treated: Yes ?    HDL Cholesterol: 41 mg/dL ?    Total Cholesterol: 247 mg/dL   ? ? ?  02/05/2022  ?  8:23 AM 01/24/2022  ? 11:27 AM 12/01/2020  ? 10:45 AM  ?Depression screen PHQ 2/9  ?Decreased Interest 0 0 0  ?Down, Depressed, Hopeless 0 0 0  ?PHQ - 2 Score 0 0 0  ?  ? ?Social History  ? ?Tobacco Use  ?Smoking Status Never  ?Smokeless Tobacco Never  ? ?BP Readings from Last 3 Encounters:  ?01/24/22 (!) 150/82  ?12/07/21 (!) 142/82  ?11/28/21 128/80  ? ?Pulse Readings from Last 3 Encounters:  ?01/24/22 76  ?12/07/21 90  ?11/28/21 75  ? ?Wt Readings from  Last 3 Encounters:  ?02/05/22 172 lb (78 kg)  ?01/24/22 172 lb 2 oz (78.1 kg)  ?12/07/21 186 lb (84.4 kg)  ? ?BMI Readings from Last 3 Encounters:  ?02/05/22 30.23 kg/m?  ?01/24/22 30.25 kg/m?  ?12/07/21 34.02 kg/m?  ? ? ?Assessment/Interventions: Review of patient past medical history, allergies, medications, health status, including review of consultants reports, laboratory and other test data, was performed as part of comprehensive evaluation and provision of chronic care management services.  ? ?SDOH:  (Social Determinants of Health) assessments and interventions performed: No - done 02/2022 ? ?SDOH Screenings  ? ?Alcohol Screen: Low Risk   ? Last Alcohol Screening Score (AUDIT): 0  ?Depression (PHQ2-9): Low Risk   ? PHQ-2 Score: 0  ?Financial Resource Strain: Low Risk   ? Difficulty of Paying Living Expenses: Not hard at all  ?Food Insecurity: No Food Insecurity  ? Worried About Charity fundraiser in the Last Year: Never true  ? Ran Out of Food in the Last Year: Never true  ?Housing: Low Risk   ? Last Housing Risk Score: 0  ?Physical Activity: Insufficiently Active  ? Days of Exercise per Week: 7 days  ? Minutes of Exercise per Session: 20 min  ?Social  Connections: Socially Integrated  ? Frequency of Communication with Friends and Family: More than three times a week  ? Frequency of Social Gatherings with Friends and Family: More than three times a week  ? Atte

## 2022-03-19 ENCOUNTER — Other Ambulatory Visit: Payer: Self-pay | Admitting: Physician Assistant

## 2022-03-19 ENCOUNTER — Telehealth: Payer: Self-pay | Admitting: Pharmacist

## 2022-03-19 DIAGNOSIS — L409 Psoriasis, unspecified: Secondary | ICD-10-CM

## 2022-03-19 DIAGNOSIS — E782 Mixed hyperlipidemia: Secondary | ICD-10-CM

## 2022-03-19 MED ORDER — PRAVASTATIN SODIUM 10 MG PO TABS: 10.0000 mg | ORAL_TABLET | Freq: Every day | ORAL | 0 refills | Status: DC

## 2022-03-19 NOTE — Addendum Note (Signed)
Addended by: Charlton Haws on: 03/19/2022 10:47 AM ? ? Modules accepted: Orders ? ?

## 2022-03-19 NOTE — Telephone Encounter (Signed)
Spoke with the patient and she will pick up the pravastatin and keep [place] informed if any issues.  ? ?[name], CPP notified ? ?Neven Fina, CCMA ?Health concierge  ?[phone]  ?

## 2022-03-19 NOTE — Telephone Encounter (Signed)
Agree with recommendations and plan

## 2022-03-19 NOTE — Patient Instructions (Signed)
Visit Information ? ?Phone number for Pharmacist: [phone] ? ?Thank you for meeting with me to discuss your medications! I look forward to working with you to achieve your health care goals. Below is a summary of what we talked about during the visit: ? ? Goals Addressed   ?None ?  ? ? ?Care Plan : [place]  ?Updates made by Charlton Haws, RPH since 03/19/2022 12:00 AM  ?  ? ?Problem: Hypertension, Hyperlipidemia, and Diabetes, Neuropathy   ?Priority: High  ?  ? ?Long-Range Goal: Disease mgmt   ?Start Date: 03/19/2022  ?Expected End Date: 03/20/2023  ?This Visit's Progress: On track  ?Priority: High  ?Note:   ?Current Barriers:  ?Unable to independently monitor therapeutic efficacy ?Suboptimal therapeutic regimen for HLD/ASCVD risk ? ?Pharmacist Clinical Goal(s):  ?Patient will achieve adherence to monitoring guidelines and medication adherence to achieve therapeutic efficacy ?adhere to plan to optimize therapeutic regimen for HLD/ASCVD risk as evidenced by report of adherence to recommended medication management changes through collaboration with PharmD and provider.  ? ?Interventions: ?1:1 collaboration with Lesleigh Noe, MD regarding development and update of comprehensive plan of care as evidenced by provider attestation and co-signature ?Inter-disciplinary care team collaboration (see longitudinal plan of care) ?Comprehensive medication review performed; medication list updated in electronic medical record ? ?Hypertension (BP goal <130/80) ?-Not ideally controlled - BP above goal in recent OV, however pt reports home BP has been "normal" when she checked, she is unable to provide log today ?-Current home readings: n/a ?-Current treatment: ?Lisinopril 20 mg - 1/2 tab daily - Appropriate, Effective, Safe, Accessible ?-Medications previously tried: n/a  ?-Educated on BP goals and benefits of medications for prevention of heart attack, stroke and kidney damage; Importance of home blood  pressure monitoring; ?-Counseled to monitor BP at home daily, document, and provide log at future appointments ?-Recommended to continue current medication ? ?Hyperlipidemia: (LDL goal < 70) ?-Uncontrolled - LDL 168 (06/2021) above goal;  ?-ASCVD risk 38.5% is very high; query statin intolerance - c/o fatigue, weakness ?-Current treatment: ?None ?-Medications previously tried: ezetimibe, fenofibrate, rosuvastatin 5 mg weekly, simvastatin 20 mg ?-Educated on Cholesterol goals; Benefits of statin for ASCVD risk reduction; ?-Reviewed DM statin recommendations ?-Recommend to start pravastatin 10 mg daily - pt agreed to trial ? ?Diabetes (A1c goal <7%) ?-Not ideally controlled - A1c 7.9% (11/2021) above goal; pt started Ozempic 01/2022 however after a few weeks she realized she was not injecting correctly and doses were being wasted; she has corrected her technique and BG has improved ?-Current home glucose readings; pt reports sugars are highest in the mornings ?Fasting: 141, 132, 116 ?Post-prandial: 150-195 ?-Denies hypoglycemic/hyperglycemic symptoms ?-Current medications: ?Ozempic 0.5 mg weekly - Appropriate, Effective, Safe, Accessible ?Testing supplies ?-Medications previously tried: Trulicity, Jardiance (yeast infxn), glipizide, Victoza, metformin (GI) ?-Current meal patterns: increased fruit and vegetables, water ?-Current exercise: walking ?-Educated on A1c and blood sugar goals;Complications of diabetes including kidney damage, retinal damage, and cardiovascular disease; ?-Assessed pt finances - she is able to pay for Ozempic currently ($47/month); discussed PAP is available if needed ?-Counseled on diet and exercise extensively ?-Recommended to continue current medication ? ?Neuropathy (Goal: manage symptoms) ?-Controlled - per pt report ?-Follows with DPM Hyatt ?-Current treatment  ?Duloxetine 30 mg daily - Appropriate, Effective, Safe, Accessible ?Gabapentin 600 mg - 2 cap BID - Appropriate, Effective, Safe,  Accessible ?-Medications previously tried: n/a  ?-Recommended to continue current medication ? ?GERD (Goal: manage symptoms) ?-Controlled - per pt report ?-  Hx esophageal stretching ?-Current treatment  ?Esomeprazole 40 mg BID - Appropriate, Effective, Safe, Accessible ?-Medications previously tried: n/a  ?-Recommended to continue current medication ? ?Patient Goals/Self-Care Activities ?Patient will:  ?- take medications as prescribed as evidenced by patient report and record review ?focus on medication adherence by routine ?check blood pressure daily, document, and provide at future appointments ?  ?  ? ?Abigail [name] was given information about Chronic Care Management services today including:  ?CCM service includes personalized support from designated clinical staff supervised by her physician, including individualized plan of care and coordination with other care providers ?24/7 contact phone numbers for assistance for urgent and routine care needs. ?Standard insurance, coinsurance, copays and deductibles apply for chronic care management only during months in which we provide at least 20 minutes of these services. Most insurances cover these services at 100%, however patients may be responsible for any copay, coinsurance and/or deductible if applicable. This service may help you avoid the need for more expensive face-to-face services. ?Only one practitioner may furnish and bill the service in a calendar month. ?The patient may stop CCM services at any time (effective at the end of the month) by phone call to the office staff. ? ?Patient agreed to services and verbal consent obtained.  ? ?The patient verbalized understanding of instructions, educational materials, and care plan provided today and declined offer to receive copy of patient instructions, educational materials, and care plan.  ?Telephone follow up appointment with pharmacy team member scheduled for: 6 months ? ?[name], PharmD,  BCACP ?Clinical Pharmacist ?[facility] ?[phone]  ?

## 2022-03-19 NOTE — Telephone Encounter (Signed)
Reviewed cholesterol/ASCVD risk 38.5% with patient; she did not tolerate simvastatin or rosuvastatin due to fatigue/weakness. LDL was 168 (Aug 2022) and with comorbid DM she would benefit from statin therapy, she agreed to try low-dose pravastatin. ? ?Recommendation: ?Trial of pravastatin 10 mg daily ?Repeat lipid panel upcoming PCP appt 6/28 ? ?Routing to PCP for approval. ? ?Pharmacy: ?[facility] [address], [place] ?[place] ?[place] [address] ?Phone: [phone] Fax: [phone] ? ? ?Lipid Panel  ?   ?Component Value Date/Time  ? CHOL 247 (H) 07/02/2021 1224  ? TRIG 222 (H) 07/02/2021 1224  ? HDL 41 (L) 07/02/2021 1224  ? CHOLHDL 6.0 (H) 07/02/2021 1224  ? VLDL 46 (H) 04/02/2017 1012  ? LDLCALC 168 (H) 07/02/2021 1224  ? ?The 10-year ASCVD risk score (Arnett DK, et al., 2019) is: 38.5% ?  Values used to calculate the score: ?    Age: 71 years ?    Sex: Female ?    Is Non-Hispanic African American: No ?    Diabetic: Yes ?    Tobacco smoker: No ?    Systolic Blood Pressure: 193 mmHg ?    Is BP treated: Yes ?    HDL Cholesterol: 41 mg/dL ?    Total Cholesterol: 247 mg/dL ? ?

## 2022-03-21 ENCOUNTER — Other Ambulatory Visit: Payer: Self-pay

## 2022-03-21 ENCOUNTER — Telehealth: Payer: Self-pay

## 2022-03-21 DIAGNOSIS — E1142 Type 2 diabetes mellitus with diabetic polyneuropathy: Secondary | ICD-10-CM

## 2022-03-21 MED ORDER — OZEMPIC (0.25 OR 0.5 MG/DOSE) 2 MG/1.5ML ~~LOC~~ SOPN: 0.5000 mg | PEN_INJECTOR | SUBCUTANEOUS | 0 refills | Status: DC

## 2022-03-21 NOTE — Telephone Encounter (Signed)
Fax from [facility] stating that the patient's Cosentyx is denied and patient would need to try and fail other medications.

## 2022-03-21 NOTE — Telephone Encounter (Signed)
Phone call to [facility] regarding the denial of patient's Cosentyx. Per Amy with [facility] the patient's medication was denied because there wasn't enough of clinical documentation supporting the need for the Cosentyx. I informed Amy that the patient has been on this current treatment since 2019 and she has psoriatic arthritis as well as psoriasis.  Per Amy she can restart a prior authorization for the patient and we need to send supporting documentation with the patient having psoriatic arthritis as well as psoriasis. I informed Amy that I would get that information over to them.

## 2022-03-21 NOTE — Telephone Encounter (Signed)
Office note faxed to [facility] for the patient's Cosentyx prior authorization.

## 2022-03-26 ENCOUNTER — Telehealth: Payer: Self-pay

## 2022-03-26 NOTE — Telephone Encounter (Signed)
Fax received from [facility] stating that no prior authorization is required for the patient's Cosentyx.   PA Approval Start Date: 03/21/2022   PA Approval Expire Date: 03/22/2023

## 2022-03-30 DIAGNOSIS — M5136 Other intervertebral disc degeneration, lumbar region: Secondary | ICD-10-CM | POA: Diagnosis not present

## 2022-03-30 DIAGNOSIS — M179 Osteoarthritis of knee, unspecified: Secondary | ICD-10-CM | POA: Diagnosis not present

## 2022-04-03 DIAGNOSIS — E785 Hyperlipidemia, unspecified: Secondary | ICD-10-CM

## 2022-04-03 DIAGNOSIS — I10 Essential (primary) hypertension: Secondary | ICD-10-CM

## 2022-04-03 DIAGNOSIS — E1140 Type 2 diabetes mellitus with diabetic neuropathy, unspecified: Secondary | ICD-10-CM | POA: Diagnosis not present

## 2022-04-03 DIAGNOSIS — Z7985 Long-term (current) use of injectable non-insulin antidiabetic drugs: Secondary | ICD-10-CM | POA: Diagnosis not present

## 2022-04-03 DIAGNOSIS — Z7984 Long term (current) use of oral hypoglycemic drugs: Secondary | ICD-10-CM

## 2022-04-04 ENCOUNTER — Telehealth: Payer: Self-pay | Admitting: Physician Assistant

## 2022-04-04 NOTE — Telephone Encounter (Signed)
Insurance denied her coverage for Cosentyx after her being on it for a few years. It's time for her shot and she wants to know what to do. KRS patient

## 2022-04-04 NOTE — Telephone Encounter (Signed)
Phone call to patient to inform her that her Cosentyx was approved by her insurance company after they originally had denied the medication. I informed patient that she can contact her pharmacy and they will be able to send her medication to her. Patient aware.

## 2022-04-29 DIAGNOSIS — M1990 Unspecified osteoarthritis, unspecified site: Secondary | ICD-10-CM | POA: Diagnosis not present

## 2022-04-29 DIAGNOSIS — M5136 Other intervertebral disc degeneration, lumbar region: Secondary | ICD-10-CM | POA: Diagnosis not present

## 2022-05-01 ENCOUNTER — Ambulatory Visit (INDEPENDENT_AMBULATORY_CARE_PROVIDER_SITE_OTHER): Payer: Medicare Other | Admitting: Family Medicine

## 2022-05-01 VITALS — BP 120/50 | HR 80 | Temp 97.8°F | Ht 63.25 in | Wt 179.4 lb

## 2022-05-01 DIAGNOSIS — Z9189 Other specified personal risk factors, not elsewhere classified: Secondary | ICD-10-CM | POA: Insufficient documentation

## 2022-05-01 DIAGNOSIS — E782 Mixed hyperlipidemia: Secondary | ICD-10-CM | POA: Diagnosis not present

## 2022-05-01 DIAGNOSIS — G629 Polyneuropathy, unspecified: Secondary | ICD-10-CM | POA: Diagnosis not present

## 2022-05-01 DIAGNOSIS — I10 Essential (primary) hypertension: Secondary | ICD-10-CM

## 2022-05-01 DIAGNOSIS — E1149 Type 2 diabetes mellitus with other diabetic neurological complication: Secondary | ICD-10-CM

## 2022-05-01 LAB — LIPID PANEL
Cholesterol: 198 mg/dL (ref 0–200)
HDL: 44.8 mg/dL (ref >39.00)
NonHDL: 153.13
Total CHOL/HDL Ratio: 4
Triglycerides: 203 mg/dL — ABNORMAL HIGH (ref 0.0–149.0)
VLDL: 40.6 mg/dL — ABNORMAL HIGH (ref 0.0–40.0)

## 2022-05-01 LAB — COMPREHENSIVE METABOLIC PANEL
ALT: 15 U/L (ref 0–35)
AST: 17 U/L (ref 0–37)
Albumin: 4 g/dL (ref 3.5–5.2)
Alkaline Phosphatase: 71 U/L (ref 39–117)
BUN: 18 mg/dL (ref 6–23)
CO2: 26 mEq/L (ref 19–32)
Calcium: 8.9 mg/dL (ref 8.4–10.5)
Chloride: 108 mEq/L (ref 96–112)
Creatinine, Ser: 0.61 mg/dL (ref 0.40–1.20)
GFR: 89.02 mL/min (ref >60.00)
Glucose, Bld: 93 mg/dL (ref 70–99)
Potassium: 3.8 mEq/L (ref 3.5–5.1)
Sodium: 142 mEq/L (ref 135–145)
Total Bilirubin: 0.6 mg/dL (ref 0.2–1.2)
Total Protein: 6.2 g/dL (ref 6.0–8.3)

## 2022-05-01 LAB — POCT GLYCOSYLATED HEMOGLOBIN (HGB A1C): Hemoglobin A1C: 7.2 % — AB (ref 4.0–5.6)

## 2022-05-01 LAB — LDL CHOLESTEROL, DIRECT: Direct LDL: 130 mg/dL

## 2022-05-01 MED ORDER — SEMAGLUTIDE (1 MG/DOSE) 4 MG/3ML ~~LOC~~ SOPN
1.0000 mg | PEN_INJECTOR | SUBCUTANEOUS | 0 refills | Status: DC
Start: 2022-05-01 — End: 2022-05-17

## 2022-05-01 NOTE — Assessment & Plan Note (Signed)
Improved hemoglobin A1c to 7.2.  Discussed increasing Ozempic from 0.5 to 1 mg.  Return in 3 months.  Foot exam with stable neuropathy.

## 2022-05-01 NOTE — Patient Instructions (Addendum)
Increase ozempic to 1 mg - new prescription sent to pharmacy  Return in 3 months for diabetes check  Please check your blood pressure 2-4 times a week.   To check your blood pressure 1) Sit in a quiet and relaxed place for 5 minutes 2) Make sure your feet are flat on the ground 3) Consider checking first thing in the morning   Normal blood pressure is less than 140/90 Ideally you blood pressure should be around 120/80  Other ways you can reduce your blood pressure:  1) Regular exercise -- Try to get 150 minutes (30 minutes, 5 days a week) of moderate to vigorous aerobic excercise -- Examples: brisk walking (2.5 miles per hour), water aerobics, dancing, gardening, tennis, biking slower than 10 miles per hour 2) DASH Diet - low fat meats, more fresh fruits and vegetables, whole grains, low salt 3) Quit smoking if you smoke 4) Loose 5-10% of your body weight

## 2022-05-01 NOTE — Assessment & Plan Note (Signed)
She describes what sounds like a history of a bull's-eye rash, at that time she had Lyme testing which was negative.  She denies receiving any antibiotics for this.  She questions chronic Lyme's given neuropathy and occasional fatigue.  Labs to assess today

## 2022-05-01 NOTE — Progress Notes (Signed)
Subjective:     Abigail [name] is a 71 y.o. female presenting for Follow-up (DM/BP )     HPI  #Diabetes Currently taking ozempic 0.5 mg   Using medications without difficulties: Yes Hypoglycemic episodes: no Hyperglycemic episodes:Yes  Feet problems:Yes  Blood Sugars averaging: 120-160 Last HgbA1c:  Lab Results  Component Value Date   HGBA1C 7.2 (A) 05/01/2022    Diabetes Health Maintenance Due:    Diabetes Health Maintenance Due  Topic Date Due   OPHTHALMOLOGY EXAM  Never done   HEMOGLOBIN A1C  10/31/2022   FOOT EXAM  05/02/2023   #HTN - checking at home but does not sit for 5 minutes prior to checking   #lyme disease - a few years ago had a tick bite - did have a bullseye like rash - went to UC and did blood which came back negative - she wonders if neuropathy may be related - was not treated for lyme disease at presentation  Review of Systems  01/24/2022: Clinic - DM - stop metformin, start ozempic. Pharmacy referral. HTN - home log, cont lisinopri  Social History   Tobacco Use  Smoking Status Never  Smokeless Tobacco Never        Objective:    BP Readings from Last 3 Encounters:  05/01/22 (!) 120/50  01/24/22 (!) 150/82  12/07/21 (!) 142/82   Wt Readings from Last 3 Encounters:  05/01/22 179 lb 6 oz (81.4 kg)  02/05/22 172 lb (78 kg)  01/24/22 172 lb 2 oz (78.1 kg)    BP (!) 120/50   Pulse 80   Temp 97.8 F (36.6 C) (Temporal)   Ht 5' 3.25" (1.607 m)   Wt 179 lb 6 oz (81.4 kg)   SpO2 95%   BMI 31.52 kg/m    Physical Exam Constitutional:      General: She is not in acute distress.    Appearance: She is well-developed. She is not diaphoretic.  HENT:     Right Ear: External ear normal.     Left Ear: External ear normal.     Nose: Nose normal.  Eyes:     Conjunctiva/sclera: Conjunctivae normal.  Cardiovascular:     Rate and Rhythm: Normal rate and regular rhythm.     Heart sounds: Murmur heard.  Pulmonary:     Effort:  Pulmonary effort is normal. No respiratory distress.     Breath sounds: Normal breath sounds. No wheezing.  Musculoskeletal:     Cervical back: Neck supple.  Skin:    General: Skin is warm and dry.     Capillary Refill: Capillary refill takes less than 2 seconds.  Neurological:     Mental Status: She is alert. Mental status is at baseline.  Psychiatric:        Mood and Affect: Mood normal.        Behavior: Behavior normal.           Assessment & Plan:   Problem List Items Addressed This Visit       Cardiovascular and Mediastinum   Essential hypertension    Blood pressure controlled, continue lisinopril 20 mg daily.      Relevant Orders   Comprehensive metabolic panel     Endocrine   Type 2 diabetes mellitus with neurological complications (HCC) - Primary    Improved hemoglobin A1c to 7.2.  Discussed increasing Ozempic from 0.5 to 1 mg.  Return in 3 months.  Foot exam with stable neuropathy.  Relevant Medications   Semaglutide, 1 MG/DOSE, 4 MG/3ML SOPN   Other Relevant Orders   POCT glycosylated hemoglobin (Hb A1C) (Completed)   Comprehensive metabolic panel     Other   Hyperlipidemia    She is taking pravastatin 10 mg daily.  Recheck lipids today      Relevant Orders   Lipid panel   Comprehensive metabolic panel   Risk of exposure to Lyme disease    She describes what sounds like a history of a bull's-eye rash, at that time she had Lyme testing which was negative.  She denies receiving any antibiotics for this.  She questions chronic Lyme's given neuropathy and occasional fatigue.  Labs to assess today      Relevant Orders   Lyme Disease Abs IgG, IgM, IFA, CSF   Other Visit Diagnoses     Neuropathy       Relevant Orders   Lyme Disease Abs IgG, IgM, IFA, CSF        Return in about 3 months (around 08/01/2022) for Diabetes.  Lesleigh Noe, MD

## 2022-05-01 NOTE — Assessment & Plan Note (Signed)
She is taking pravastatin 10 mg daily.  Recheck lipids today

## 2022-05-01 NOTE — Assessment & Plan Note (Signed)
Blood pressure controlled, continue lisinopril 20 mg daily.

## 2022-05-02 ENCOUNTER — Other Ambulatory Visit: Payer: Self-pay | Admitting: Family Medicine

## 2022-05-02 DIAGNOSIS — Z9189 Other specified personal risk factors, not elsewhere classified: Secondary | ICD-10-CM

## 2022-05-02 NOTE — Progress Notes (Signed)
I spoke to pt earlier about her lab results and told her that lyme test wasn't done and she would need to come back. Pt states that she isn't going to worry about it now, she was just wanting it checked while she was getting other blood work done.

## 2022-05-02 NOTE — Progress Notes (Signed)
Reorder due to error. Please call patient to have her return for lab work

## 2022-05-02 NOTE — Addendum Note (Signed)
Addended by: Francella Solian on: 05/02/2022 08:47 AM   Modules accepted: Orders

## 2022-05-17 ENCOUNTER — Telehealth: Payer: Self-pay | Admitting: Family Medicine

## 2022-05-17 DIAGNOSIS — E1149 Type 2 diabetes mellitus with other diabetic neurological complication: Secondary | ICD-10-CM

## 2022-05-17 MED ORDER — SEMAGLUTIDE(0.25 OR 0.5MG/DOS) 2 MG/3ML ~~LOC~~ SOPN: 0.5000 mg | PEN_INJECTOR | SUBCUTANEOUS | 0 refills | Status: DC

## 2022-05-17 NOTE — Addendum Note (Signed)
Addended by: Waunita Schooner R on: 05/17/2022 12:02 PM   Modules accepted: Orders

## 2022-05-17 NOTE — Telephone Encounter (Signed)
Spoke to pt and informed her of lower dose sent into pharmacy. Told pt that if her symptoms continue then she should let us know.

## 2022-05-17 NOTE — Telephone Encounter (Signed)
Patient called in stating that her dosage of Semaglutide, 1 MG/DOSE, 4 MG/3ML SOPN had went up Friday. She states she started feeling sick Monday and has had diarrhea, stomach cramps and some vomiting. Patient wants to know if she should continue with next dose or if it could be something else. Please advise. Thank you

## 2022-05-17 NOTE — Telephone Encounter (Signed)
It is probably the medicine.   I will send in the lower dose that she was tolerating. She could try another dose if she wants but I suspect this is a side effect and would recommend going back down to the 0.5 mg dose. I've sent this into [facility]

## 2022-05-22 ENCOUNTER — Other Ambulatory Visit: Payer: Self-pay | Admitting: Family Medicine

## 2022-05-22 DIAGNOSIS — E782 Mixed hyperlipidemia: Secondary | ICD-10-CM

## 2022-05-23 ENCOUNTER — Telehealth: Payer: Self-pay

## 2022-05-23 NOTE — Chronic Care Management (AMB) (Signed)
Chronic Care Management Pharmacy Assistant   Name: Abigail [name]  MRN: [id number] DOB: 17-Jan-1951  Reason for Encounter:Diabetes Disease State   Recent office visits:  05/01/22-Abigail Cody,MD(PCP)- f/u DM/BP, labs ( cholesterol is high,Otherwise labs look good) improved A1c 7.2- discuss increase Ozempic to 1 mg. F/u 3 months  Recent consult visits:  None since last CCM contact   Hospital visits:  None in previous 6 months  Medications: Outpatient Encounter Medications as of 05/23/2022  Medication Sig   albuterol (VENTOLIN HFA) 108 (90 Base) MCG/ACT inhaler Inhale 2 puffs into the lungs every 4 (four) hours as needed for wheezing or shortness of breath.   Azelastine HCl 137 MCG/SPRAY SOLN as needed.   clobetasol (TEMOVATE) 0.05 % external solution Apply 1 application topically 2 (two) times daily.   COSENTYX SENSOREADY, 300 MG, 150 MG/ML SOAJ INJECT TWO PENS SUBCUTANEOUSLY EVERY 4 WEEKS. REFRIGERATE. ALLOW 15 TO 30 MINUTES AT ROOM TEMP PRIOR TO ADMINISTRATION.   diclofenac (FLECTOR) 1.3 % PTCH    DULoxetine (CYMBALTA) 30 MG capsule Take 1 capsule (30 mg total) by mouth daily.   esomeprazole (NEXIUM) 40 MG capsule Take 1 capsule (40 mg total) by mouth 2 (two) times daily before a meal.   Fluocinolone Acetonide Body 0.01 % OIL Apply 1 application. topically 2 (two) times daily.   gabapentin (NEURONTIN) 600 MG tablet Take 2 capsules in the morning and 2 capsules at night   glucose blood (FREESTYLE LITE) test strip Use as instructed to monitor FSBS 1x daily. Dx: E11.9   glucose monitoring kit (FREESTYLE) monitoring kit 1 each by Does not apply route as needed for other. Dispense one glucometer of choice, testing strips/lancets for once per day glucose checks.  QS for 1 month, 11 refills.   Lancets (FREESTYLE) lancets Use as instructed   lisinopril (ZESTRIL) 20 MG tablet Take 0.5 tablets (10 mg total) by mouth daily.   pravastatin (PRAVACHOL) 10 MG tablet Take 1 tablet (10 mg total) by  mouth daily. For cholesterol   Semaglutide,0.25 or 0.5MG/DOS, 2 MG/3ML SOPN Inject 0.5 mg into the skin once a week.   No facility-administered encounter medications on file as of 05/23/2022.     Recent Relevant Labs: Lab Results  Component Value Date/Time   HGBA1C 7.2 (A) 05/01/2022 11:46 AM   HGBA1C 7.9 (H) 11/16/2021 10:30 AM   HGBA1C 6.0 (H) 07/02/2021 12:24 PM   MICROALBUR 0.3 11/16/2021 10:30 AM   MICROALBUR 0.6 12/01/2020 11:19 AM    Kidney Function Lab Results  Component Value Date/Time   CREATININE 0.61 05/01/2022 12:13 PM   CREATININE 0.67 11/16/2021 10:30 AM   CREATININE 0.67 07/02/2021 12:24 PM   GFR 89.02 05/01/2022 12:13 PM   GFRNONAA 66 07/09/2019 12:44 PM   GFRAA 76 07/09/2019 12:44 PM     Contacted patient on 05/23/22 to discuss diabetes disease state.   Current antihyperglycemic regimen:   Ozempic 0.33m  once a week     Patient verbally confirms she is taking the above medications as directed. Yes  What diet changes have been made to improve diabetes control? The patient has increased water intake, she is having sugar free drinks,looks at carb content.  What recent interventions/DTPs have been made to improve glycemic control:  -Trial of Pravastatin 10 mg daily-  Patient is taking   Keep daily BG log -  Patient was unable to tolerate a higher dose of ozempic due to N&V.  Have there been any recent hospitalizations or ED visits since  last visit with CPP? No  Patient denies hypoglycemic symptoms, including Pale, Sweaty, Shaky, Hungry, Nervous/irritable, and Vision changes  Patient denies hyperglycemic symptoms, including blurry vision, excessive thirst, fatigue, polyuria, and weakness  How often are you checking your blood sugar? twice daily  What are your blood sugars ranging?  Fasting:   05/23/22  96      96,101,116,111,91  During the week, how often does your blood glucose drop below 70? Never  Are you checking your feet daily/regularly?  Yes  Adherence Review: Is the patient currently on a STATIN medication? Yes Is the patient currently on ACE/ARB medication? Yes Does the patient have >5 day gap between last estimated fill dates? No  Care Gaps: Annual wellness visit in last year? Yes Most recent A1C reading:7.2  05/01/22 Most Recent BP reading:120/50  80-P  05/01/22  Last eye exam / retinopathy screening:never done- will refer to [facility] Last diabetic foot exam: UTD  Counseled patient on importance of annual eye and foot exam.   Star Rating Drugs:  Medication:  Last Fill: Day Supply Pravastatin 99m 03/19/22 90 Lisinopril 260m1/13/23 [place] hand due to husband [place] [facility] [place]  has supply on hand, she denies missing any doses. Ozempic   05/17/22 28 Metformin ER 500 03/15/22 90  PCP appointment on 08/01/22 and CCM appointment on 06/12/22  LiCharlene BrookeCPP notified  VeAvel [phone]

## 2022-06-07 ENCOUNTER — Telehealth: Payer: Self-pay

## 2022-06-07 NOTE — Chronic Care Management (AMB) (Signed)
    Chronic Care Management Pharmacy Assistant   Name: Abigail [name]  MRN: [id number] DOB: August 08, 1951   Reason for Encounter: Reminder Call   Conditions to be addressed/monitored: HTN, HLD, COPD, and DMII   Medications: Outpatient Encounter Medications as of 06/07/2022  Medication Sig   albuterol (VENTOLIN HFA) 108 (90 Base) MCG/ACT inhaler Inhale 2 puffs into the lungs every 4 (four) hours as needed for wheezing or shortness of breath.   Azelastine HCl 137 MCG/SPRAY SOLN as needed.   clobetasol (TEMOVATE) 0.05 % external solution Apply 1 application topically 2 (two) times daily.   COSENTYX SENSOREADY, 300 MG, 150 MG/ML SOAJ INJECT TWO PENS SUBCUTANEOUSLY EVERY 4 WEEKS. REFRIGERATE. ALLOW 15 TO 30 MINUTES AT ROOM TEMP PRIOR TO ADMINISTRATION.   diclofenac (FLECTOR) 1.3 % PTCH    DULoxetine (CYMBALTA) 30 MG capsule Take 1 capsule (30 mg total) by mouth daily.   esomeprazole (NEXIUM) 40 MG capsule Take 1 capsule (40 mg total) by mouth 2 (two) times daily before a meal.   Fluocinolone Acetonide Body 0.01 % OIL Apply 1 application. topically 2 (two) times daily.   gabapentin (NEURONTIN) 600 MG tablet Take 2 capsules in the morning and 2 capsules at night   glucose blood (FREESTYLE LITE) test strip Use as instructed to monitor FSBS 1x daily. Dx: E11.9   glucose monitoring kit (FREESTYLE) monitoring kit 1 each by Does not apply route as needed for other. Dispense one glucometer of choice, testing strips/lancets for once per day glucose checks.  QS for 1 month, 11 refills.   Lancets (FREESTYLE) lancets Use as instructed   lisinopril (ZESTRIL) 20 MG tablet Take 0.5 tablets (10 mg total) by mouth daily.   pravastatin (PRAVACHOL) 20 MG tablet Take 1 tablet (20 mg total) by mouth daily.   Semaglutide,0.25 or 0.5MG/DOS, 2 MG/3ML SOPN Inject 0.5 mg into the skin once a week.   No facility-administered encounter medications on file as of 06/07/2022.    [name] was contacted to remind of  upcoming telephone visit with [name] on 06/12/22 at 1:30pm. Patient was reminded to have any blood glucose and blood pressure readings available for review at appointment.   Patient confirmed appointment.  Patient needed to move up appointment 1 day due to conflict in schedule.06/11/22 _0 :30pm  Are you having any problems with your medications? No   Do you have any concerns you like to discuss with the pharmacist? No  CCM referral has been placed prior to visit?  Yes   Star Rating Drugs: Medication:  Last Fill: Day Supply Pravastatin 45m        03/19/22            90 Lisinopril 230m           11/16/21            90         Plenty on hand due to husband gets [facility]  has supply on hand, she denies missing any doses. Ozempic                      05/17/22            28 Metformin ER 500       03/15/22            90[place]CPP notified  VeAvel [phone]

## 2022-06-11 ENCOUNTER — Ambulatory Visit (INDEPENDENT_AMBULATORY_CARE_PROVIDER_SITE_OTHER): Payer: Medicare Other | Admitting: Pharmacist

## 2022-06-11 DIAGNOSIS — E782 Mixed hyperlipidemia: Secondary | ICD-10-CM

## 2022-06-11 DIAGNOSIS — E1149 Type 2 diabetes mellitus with other diabetic neurological complication: Secondary | ICD-10-CM

## 2022-06-11 DIAGNOSIS — J42 Unspecified chronic bronchitis: Secondary | ICD-10-CM

## 2022-06-11 DIAGNOSIS — I10 Essential (primary) hypertension: Secondary | ICD-10-CM

## 2022-06-11 NOTE — Progress Notes (Signed)
Chronic Care Management Pharmacy Note  06/11/2022 Name:  Abigail [name] MRN:  [id number] DOB:  [birth year]  Summary: CCM F/U visit -DM: A1c 7.2% (June 2023), she is doing well on Ozempic 0.5 mg weekly, though she did accidentally take 1 mg today (mixed up pens); reviewed food choices to minimize risk for nausea/vomiting -HLD/ASCVD risk: LDL improved from 160 to 130 on pravastatin 10 mg, she is tolerating 20 mg currently -HTN: pt reports BP is "good" when she checks  Recommendations/Changes made from today's visit: -No med changes; advised pt to mark X on Ozempic 1 mg pens to avoid mixup in future  Plan: -[place] will call patient 2 months for DM update -Pharmacist follow up televisit scheduled for 3 months -PCP visit scheduled 08/01/22    Subjective: Abigail [name] is an 71 y.o. year old female who is a primary patient of [name], Jobe Marker, MD.  The CCM team was consulted for assistance with disease management and care coordination needs.    Engaged with patient by telephone for follow up visit in response to provider referral for pharmacy case management and/or care coordination services.   Consent to Services:  The patient was given information about Chronic Care Management services, agreed to services, and gave verbal consent prior to initiation of services.  Please see initial visit note for detailed documentation.   Patient Care Team: Lesleigh Noe, MD as PCP - General (Family Medicine) Warren Danes, PA-C as Physician Assistant (Dermatology) Charlton Haws, [facility] as Pharmacist (Pharmacist) Garrel Ridgel, DPM as Consulting Physician (Podiatry) Mauri Pole, MD as Consulting Physician (Gastroenterology) Mohammed Kindle, MD as Referring Physician (Pain Medicine)  Recent office visits: 05/17/22 TE - Ozempic reduced back to 0.5 mg due to stomach issues. 05/01/22 Dr Einar Pheasant OV: f/u - LDL 130. A1c 7.2%; increased Ozempic to 1 mg. Increased pravastatin to  10m.  01/24/22-Abigail Cody,MD(PCP)-f/u diarrhea with metformin-stop metformin,Start Ozempic-once weekly injection-apply mupirocin ointment to mild wound.  11/28/21-Abigail Martinez,NP([facility])-f/u cough,chest XR, Start doxycycline 1071mLast A1c 7.9%.  Patient agreeable to GLP-1a therapy.  Start Victoza and continue Metformin. 11/16/21-Abigail Martinez,NP(Fam Med) F/U DM-labs ordered,CT chest ordered  Recent consult visits: 02/27/22-Abigail Sheffield,PA(Derm)-f/u psoriasis-destruction of lesion-no medication changes. 01/16/22-Abigail Sheffield,PA(Derm)-destruction of lesion-no medication changes 01/09/22-Abigail [name],DPM-foot injection triamcinolone Acetonide 4061m/26/23-Abigail Crisp,MD(anesthesiology) no data found 10/02/21-Abigail Nandigam,MD(gastroenterology)f/u GERD,dysphagia-switch to dexilant 46m24mke 1 tablet daily,Use Carafate before meals and at bedtime as needed  Hospital visits: None in previous 6 months   Objective:  Lab Results  Component Value Date   CREATININE 0.61 05/01/2022   BUN 18 05/01/2022   GFR 89.02 05/01/2022   EGFR 93 11/16/2021   GFRNONAA 66 07/09/2019   GFRAA 76 07/09/2019   NA 142 05/01/2022   K 3.8 05/01/2022   CALCIUM 8.9 05/01/2022   CO2 26 05/01/2022   GLUCOSE 93 05/01/2022    Lab Results  Component Value Date/Time   HGBA1C 7.2 (A) 05/01/2022 11:46 AM   HGBA1C 7.9 (H) 11/16/2021 10:30 AM   HGBA1C 6.0 (H) 07/02/2021 12:24 PM   GFR 89.02 05/01/2022 12:13 PM   GFR 114.93 12/26/2015 10:16 AM   MICROALBUR 0.3 11/16/2021 10:30 AM   MICROALBUR 0.6 12/01/2020 11:19 AM    Last diabetic Eye exam: No results found for: "HMDIABEYEEXA"  Last diabetic Foot exam: No results found for: "HMDIABFOOTEX"   Lab Results  Component Value Date   CHOL 198 05/01/2022   HDL 44.80 05/01/2022   LDLCALC 168 (H) 07/02/2021  LDLDIRECT 130.0 05/01/2022   TRIG 203.0 (H) 05/01/2022   CHOLHDL 4 05/01/2022       Latest Ref Rng & Units 05/01/2022   12:13 PM  07/02/2021   12:24 PM 12/01/2020   11:19 AM  Hepatic Function  Total Protein 6.0 - 8.3 g/dL 6.2  6.5  6.3   Albumin 3.5 - 5.2 g/dL 4.0     AST 0 - 37 U/L 17  21  18    ALT 0 - 35 U/L 15  19  15    Alk Phosphatase 39 - 117 U/L 71     Total Bilirubin 0.2 - 1.2 mg/dL 0.6  0.6  0.5     Lab Results  Component Value Date/Time   TSH 0.91 04/02/2017 10:12 AM   TSH 0.702 05/12/2014 09:23 AM       Latest Ref Rng & Units 07/02/2021   12:24 PM 12/01/2020   11:19 AM 09/23/2018    9:25 AM  CBC  WBC 3.8 - 10.8 Thousand/uL 6.9  6.9  7.8   Hemoglobin 11.7 - 15.5 g/dL 13.8  14.2  14.0   Hematocrit 35.0 - 45.0 % 40.8  41.7  40.8   Platelets 140 - 400 Thousand/uL 189  229  219     Lab Results  Component Value Date/Time   VD25OH 25 (L) 06/13/2017 02:40 PM    Clinical ASCVD: No  The 10-year ASCVD risk score (Arnett DK, et al., 2019) is: 27.7%   Values used to calculate the score:     Age: 71 years     Sex: Female     Is Non-Hispanic African American: No     Diabetic: Yes     Tobacco smoker: No     Systolic Blood Pressure: 193 mmHg     Is BP treated: Yes     HDL Cholesterol: 44.8 mg/dL     Total Cholesterol: 198 mg/dL       02/05/2022    8:23 AM 01/24/2022   11:27 AM 12/01/2020   10:45 AM  Depression screen PHQ 2/9  Decreased Interest 0 0 0  Down, Depressed, Hopeless 0 0 0  PHQ - 2 Score 0 0 0     Social History   Tobacco Use  Smoking Status Never  Smokeless Tobacco Never   BP Readings from Last 3 Encounters:  05/01/22 (!) 120/50  01/24/22 (!) 150/82  12/07/21 (!) 142/82   Pulse Readings from Last 3 Encounters:  05/01/22 80  01/24/22 76  12/07/21 90   Wt Readings from Last 3 Encounters:  05/01/22 179 lb 6 oz (81.4 kg)  02/05/22 172 lb (78 kg)  01/24/22 172 lb 2 oz (78.1 kg)   BMI Readings from Last 3 Encounters:  05/01/22 31.52 kg/m  02/05/22 30.23 kg/m  01/24/22 30.25 kg/m    Assessment/Interventions: Review of patient past medical history, allergies,  medications, health status, including review of consultants reports, laboratory and other test data, was performed as part of comprehensive evaluation and provision of chronic care management services.   SDOH:  (Social Determinants of Health) assessments and interventions performed: No - done 02/2022  SDOH Screenings   Alcohol Screen: Low Risk  (02/05/2022)   Alcohol Screen    Last Alcohol Screening Score (AUDIT): 0  Depression (PHQ2-9): Low Risk  (02/05/2022)   Depression (PHQ2-9)    PHQ-2 Score: 0  Financial Resource Strain: Low Risk  (02/05/2022)   Overall Financial Resource Strain (CARDIA)    Difficulty of Paying Living Expenses: Not  hard at all  Food Insecurity: No Food Insecurity (02/05/2022)   Hunger Vital Sign    Worried About Running Out of Food in the Last Year: Never true    [place] in the Last Year: Never true  Housing: Low Risk  (02/05/2022)   Housing    Last Housing Risk Score: 0  Physical Activity: Insufficiently Active (02/05/2022)   Exercise Vital Sign    Days of Exercise per Week: 7 days    Minutes of Exercise per Session: 20 min  Social Connections: Socially Integrated (02/05/2022)   Social Connection and Isolation Panel [NHANES]    Frequency of Communication with Friends and Family: More than three times a week    Frequency of Social Gatherings with Friends and Family: More than three times a week    Attends Religious Services: More than 4 times per year    Active Member of [employer] or Organizations: Yes    Attends Archivist Meetings: More than 4 times per year    Marital Status: Married  Stress: No Stress Concern Present (02/05/2022)   [place] of Stress : Not at all  Tobacco Use: Low Risk  (02/27/2022)   Patient History    Smoking Tobacco Use: Never    Smokeless Tobacco Use: Never    Passive Exposure: Not on file  Transportation Needs: No Transportation Needs (02/05/2022)    PRAPARE - Transportation    Lack of Transportation (Medical): No    Lack of Transportation (Non-Medical): No    CCM Care Plan  Allergies  Allergen Reactions   Cinnamon     Dry throat    Statins     Medications Reviewed Today     Reviewed by Loreen Freud, CMA (Certified Medical Assistant) on 05/01/22 at 1142  Med List Status: <None>   Medication Order Taking? Sig Documenting Provider Last Dose Status Informant  albuterol (VENTOLIN HFA) 108 (90 Base) MCG/ACT inhaler [id number] Yes Inhale 2 puffs into the lungs every 4 (four) hours as needed for wheezing or shortness of breath. Eulogio Bear, NP Taking Active   Azelastine HCl 137 MCG/SPRAY SOLN [id number] Yes as needed. [provider] Taking Active   clobetasol (TEMOVATE) 0.05 % external solution [id number] Yes Apply 1 application topically 2 (two) times daily. Buelah Manis, Modena Nunnery, MD Taking Active   COSENTYX SENSOREADY, 300 MG, 150 MG/ML Darden Palmer [id number] Yes INJECT TWO PENS SUBCUTANEOUSLY EVERY 4 WEEKS. REFRIGERATE. ALLOW 15 TO 30 MINUTES AT ROOM TEMP PRIOR TO ADMINISTRATION. Warren Danes, PA-C Taking Active   diclofenac (FLECTOR) 1.3 % [facility] [id number] Yes  [provider] Taking Active   DULoxetine (CYMBALTA) 30 MG capsule [id number] Yes Take 1 capsule (30 mg total) by mouth daily. [place], Abigail T, DPM Taking Active   esomeprazole (NEXIUM) 40 MG capsule [id number] Yes Take 1 capsule (40 mg total) by mouth 2 (two) times daily before a meal. [name], Venia Minks, MD Taking Active   Fluocinolone Acetonide Body 0.01 % OIL [id number] Yes Apply 1 application. topically 2 (two) times daily. Warren Danes, PA-C Taking Active   gabapentin (NEURONTIN) 600 MG tablet [id number] Yes Take 2 capsules in the morning and 2 capsules at night [place], Abigail T, DPM Taking Active   glucose blood (FREESTYLE LITE) test strip [id number] Yes Use as instructed to monitor FSBS 1x daily. Dx: E11.9 Lesleigh Noe, MD Taking Active   glucose  monitoring kit (FREESTYLE) monitoring kit  [id number] Yes 1 each by Does not apply route as needed for other. Dispense one glucometer of choice, testing strips/lancets for once per day glucose checks.  QS for 1 month, 11 refills. Wendie Agreste, MD Taking Active Self  Lancets (FREESTYLE) lancets [id number] Yes Use as instructed Alycia Rossetti, MD Taking Active   lisinopril (ZESTRIL) 20 MG tablet [id number] Yes Take 0.5 tablets (10 mg total) by mouth daily. Eulogio Bear, NP Taking Active   pravastatin (PRAVACHOL) 10 MG tablet [id number] Yes Take 1 tablet (10 mg total) by mouth daily. For cholesterol Lesleigh Noe, MD Taking Active   Semaglutide,0.25 or 0.5MG/DOS, (OZEMPIC, 0.25 OR 0.5 MG/DOSE,) 2 MG/1.5ML SOPN [id number] Yes Inject 0.5 mg into the skin once a week. Lesleigh Noe, MD Taking Active             Patient Active Problem List   Diagnosis Date Noted   Risk of exposure to Lyme disease 05/01/2022   Wound healing, delayed 28/31/5176   Eosinophilic esophagitis 16/05/3709   Dysphagia 09/15/2019   Bronchiectasis without complication ([place]) 62/69/4854   Type 2 diabetes mellitus with neurological complications ([place]) 62/70/3500   Diabetic neuropathy ([place]) 04/02/2017   COPD (chronic obstructive pulmonary disease) ([place]) 04/02/2017   Aortic valve sclerosis 04/02/2017   Pulmonary nodule 05/21/2013   IRRITABLE BOWEL SYNDROME 05/18/2008   Hyperlipidemia 05/17/2008   Essential hypertension 05/17/2008   HEMORRHOIDS 05/17/2008   GERD 05/17/2008   FATTY LIVER DISEASE 05/17/2008   PSORIASIS 05/17/2008   Osteoarthritis 05/17/2008   STRESS INCONTINENCE 05/17/2008    Immunization History  Administered Date(s) Administered   Pneumococcal Conjugate-13 04/02/2017   Pneumococcal Polysaccharide-23 01/19/2018    Conditions to be addressed/monitored:  Hypertension, Hyperlipidemia, and Diabetes, Neuropathy  Care Plan : [place]  Updates made by Charlton Haws, [place]  since 06/11/2022 12:00 AM     Problem: Hypertension, Hyperlipidemia, and Diabetes, Neuropathy   Priority: High     Long-Range Goal: Disease mgmt   Start Date: 03/19/2022  Expected End Date: 03/20/2023  This Visit's Progress: On track  Recent Progress: On track  Priority: High  Note:   Current Barriers:  Unable to maintain control of DM/HLD  Pharmacist Clinical Goal(s):  Patient will maintain control of DM/HLD as evidenced by improved labwork  through collaboration with PharmD and provider.   Interventions: 1:1 collaboration with Lesleigh Noe, MD regarding development and update of comprehensive plan of care as evidenced by provider attestation and co-signature Inter-disciplinary care team collaboration (see longitudinal plan of care) Comprehensive medication review performed; medication list updated in electronic medical record  Hypertension (BP goal <130/80) -Not ideally controlled - BP above goal in recent OV, however pt reports home BP has been "normal" when she checked, she is unable to provide log today -Current home readings: "good" -Current treatment: Lisinopril 20 mg - 1/2 tab daily - Appropriate, Effective, Safe, Accessible -Medications previously tried: n/a  -Educated on BP goals and benefits of medications for prevention of heart attack, stroke and kidney damage; Importance of home blood pressure monitoring; -Counseled to monitor BP at home daily -Recommended to continue current medication  Hyperlipidemia: (LDL goal < 70) -Query controlled - LDL 130 (04/2022) improved from 168 (06/2021) with pravastatin 10 mg (23% reduction); pravastatin was increased to 20 mg at that time -ASCVD risk 38.5% is very high; query statin intolerance - c/o fatigue, weakness -Current treatment: Pravastatin 20 mg daily - Appropriate, Effective, Safe, Accessible -Medications previously tried: ezetimibe, fenofibrate, rosuvastatin 5  mg weekly, simvastatin 20 mg -Educated on Cholesterol goals;  Benefits of statin for ASCVD risk reduction; -Reviewed DM statin recommendations -Recommend to continue current medication  Diabetes (A1c goal <7%) -Not ideally controlled, improved - A1c 7.2% (04/2022) above goal; pt has been on Ozempic 0.5 mg weekly due to not tolerating 1 mg dose, although today she accidentally injected the 1 mg dose by mistake; reviewed food choices to limit risk for nausea/vomiting over the next few days -Current home glucose readings; pt reports sugars are highest in the mornings Fasting: 111, 91, 116, 101, 189 Post-prandial: 141 -Denies hypoglycemic/hyperglycemic symptoms -Current medications: Ozempic 0.5 mg weekly - Appropriate, Effective, Safe, Accessible Testing supplies -Medications previously tried: Trulicity, Jardiance (yeast infxn), glipizide, Victoza, metformin (GI), Ozempic 1 mg (GI) -Current meal patterns: increased fruit and vegetables, water -Current exercise: walking -Educated on A1c and blood sugar goals;Complications of diabetes including kidney damage, retinal damage, and cardiovascular disease; -Assessed pt finances - she is able to pay for Ozempic currently ($47/month); discussed PAP is available if needed -Counseled on diet and exercise extensively -Recommended to continue current medication; advised pt to call if she has issues with accidental Ozempic 1 mg dose; advised pt to mark Ozempic 1 mg pen with permanent marker to avoid mistaking it again  Neuropathy (Goal: manage symptoms) -Controlled - per pt report -Follows with DPM [name] -Current treatment  Duloxetine 30 mg daily - Appropriate, Effective, Safe, Accessible Gabapentin 600 mg - 2 cap BID - Appropriate, Effective, Safe, Accessible -Medications previously tried: n/a  -Recommended to continue current medication  GERD (Goal: manage symptoms) -Controlled - per pt report -Hx esophageal stretching -Current treatment  Esomeprazole 40 mg BID - Appropriate, Effective, Safe,  Accessible -Medications previously tried: n/a  -Recommended to continue current medication  Patient Goals/Self-Care Activities Patient will:  - take medications as prescribed as evidenced by patient report and record review focus on medication adherence by routine check blood pressure daily, document, and provide at future appointments      Medication Assistance: None required.  Patient affirms current coverage meets needs.  Compliance/Adherence/Medication fill history: Care Gaps: Foot exam (due 12/01/21) Eye exam (never done)  Star-Rating Drugs: Lisinopril - PDC 38%, LF 11/16/21 x 90 ds, EXP 05/16/22 Ozempic - PDC 91% Pravastatin - PDC 100%  Medication Access: Within the past 30 days, how often has patient missed a dose of medication? 0 Is a pillbox or other method used to improve adherence? Yes  Factors that may affect medication adherence? no barriers identified Are meds synced by current pharmacy? No  Are meds delivered by current pharmacy? No  Does patient experience delays in picking up medications due to transportation concerns? Yes   [facility] Services Reviewed: Is patient disadvantaged to use [facility]?: Yes  Current Rx insurance plan: BCBS [place] Name and location of Current pharmacy:  [facility] [place], [place] - [place] [address] [place] [address] Phone: [phone] Fax: [phone]  [facility] [place] [place] [facility] [address] [place] [address] Phone: [phone] Fax: [phone]  [facility] services reviewed with patient today?: No  Patient requests to transfer care to [facility]?: No  Reason patient declined to change pharmacies: Disadvantaged due to insurance/mail order   Care Plan and Follow Up Patient Decision:  Patient agrees to Care Plan  and Follow-up.  Plan: Telephone follow  up appointment with care management team member scheduled for:  3 months  [name], PharmD, BCACP Clinical Pharmacist [facility] at [facility] [phone]

## 2022-06-11 NOTE — Patient Instructions (Signed)
Visit Information  Phone number for Pharmacist: [phone]   Goals Addressed   None     Care Plan : [place]  Updates made by Charlton Haws, [place] since 06/11/2022 12:00 AM     Problem: Hypertension, Hyperlipidemia, and Diabetes, Neuropathy   Priority: High     Long-Range Goal: Disease mgmt   Start Date: 03/19/2022  Expected End Date: 03/20/2023  This Visit's Progress: On track  Recent Progress: On track  Priority: High  Note:   Current Barriers:  Unable to maintain control of DM/HLD  Pharmacist Clinical Goal(s):  Patient will maintain control of DM/HLD as evidenced by improved labwork  through collaboration with PharmD and provider.   Interventions: 1:1 collaboration with Lesleigh Noe, MD regarding development and update of comprehensive plan of care as evidenced by provider attestation and co-signature Inter-disciplinary care team collaboration (see longitudinal plan of care) Comprehensive medication review performed; medication list updated in electronic medical record  Hypertension (BP goal <130/80) -Not ideally controlled - BP above goal in recent OV, however pt reports home BP has been "normal" when she checked, she is unable to provide log today -Current home readings: "good" -Current treatment: Lisinopril 20 mg - 1/2 tab daily - Appropriate, Effective, Safe, Accessible -Medications previously tried: n/a  -Educated on BP goals and benefits of medications for prevention of heart attack, stroke and kidney damage; Importance of home blood pressure monitoring; -Counseled to monitor BP at home daily -Recommended to continue current medication  Hyperlipidemia: (LDL goal < 70) -Query controlled - LDL 130 (04/2022) improved from 168 (06/2021) with pravastatin 10 mg (23% reduction); pravastatin was increased to 20 mg at that time -ASCVD risk 38.5% is very high; query statin intolerance - c/o fatigue, weakness -Current treatment: Pravastatin 20 mg daily  - Appropriate, Effective, Safe, Accessible -Medications previously tried: ezetimibe, fenofibrate, rosuvastatin 5 mg weekly, simvastatin 20 mg -Educated on Cholesterol goals; Benefits of statin for ASCVD risk reduction; -Reviewed DM statin recommendations -Recommend to continue current medication  Diabetes (A1c goal <7%) -Not ideally controlled, improved - A1c 7.2% (04/2022) above goal; pt has been on Ozempic 0.5 mg weekly due to not tolerating 1 mg dose, although today she accidentally injected the 1 mg dose by mistake; reviewed food choices to limit risk for nausea/vomiting over the next few days -Current home glucose readings; pt reports sugars are highest in the mornings Fasting: 111, 91, 116, 101, 189 Post-prandial: 141 -Denies hypoglycemic/hyperglycemic symptoms -Current medications: Ozempic 0.5 mg weekly - Appropriate, Effective, Safe, Accessible Testing supplies -Medications previously tried: Trulicity, Jardiance (yeast infxn), glipizide, Victoza, metformin (GI), Ozempic 1 mg (GI) -Current meal patterns: increased fruit and vegetables, water -Current exercise: walking -Educated on A1c and blood sugar goals;Complications of diabetes including kidney damage, retinal damage, and cardiovascular disease; -Assessed pt finances - she is able to pay for Ozempic currently ($47/month); discussed PAP is available if needed -Counseled on diet and exercise extensively -Recommended to continue current medication; advised pt to call if she has issues with accidental Ozempic 1 mg dose; advised pt to mark Ozempic 1 mg pen with permanent marker to avoid mistaking it again  Neuropathy (Goal: manage symptoms) -Controlled - per pt report -Follows with DPM Hyatt -Current treatment  Duloxetine 30 mg daily - Appropriate, Effective, Safe, Accessible Gabapentin 600 mg - 2 cap BID - Appropriate, Effective, Safe, Accessible -Medications previously tried: n/a  -Recommended to continue current  medication  GERD (Goal: manage symptoms) -Controlled - per pt report -Hx esophageal stretching -  Current treatment  Esomeprazole 40 mg BID - Appropriate, Effective, Safe, Accessible -Medications previously tried: n/a  -Recommended to continue current medication  Patient Goals/Self-Care Activities Patient will:  - take medications as prescribed as evidenced by patient report and record review focus on medication adherence by routine check blood pressure daily, document, and provide at future appointments      The patient verbalized understanding of instructions, educational materials, and care plan provided today and DECLINED offer to receive copy of patient instructions, educational materials, and care plan.  Telephone follow up appointment with pharmacy team member scheduled for: 3 months  [name], PharmD, [facility] Clinical Pharmacist [facility] at [facility] [phone]

## 2022-06-12 ENCOUNTER — Ambulatory Visit (INDEPENDENT_AMBULATORY_CARE_PROVIDER_SITE_OTHER): Payer: Medicare Other | Admitting: Podiatry

## 2022-06-12 ENCOUNTER — Telehealth: Payer: Medicare Other

## 2022-06-12 DIAGNOSIS — E1142 Type 2 diabetes mellitus with diabetic polyneuropathy: Secondary | ICD-10-CM | POA: Diagnosis not present

## 2022-06-12 DIAGNOSIS — M778 Other enthesopathies, not elsewhere classified: Secondary | ICD-10-CM | POA: Diagnosis not present

## 2022-06-12 MED ORDER — DICLOFENAC EPOLAMINE 1.3 % EX PTCH: 1.0000 | MEDICATED_PATCH | Freq: Two times a day (BID) | CUTANEOUS | 6 refills | Status: DC

## 2022-06-12 MED ORDER — TRIAMCINOLONE ACETONIDE 40 MG/ML IJ SUSP
40.0000 mg | Freq: Once | INTRAMUSCULAR | Status: AC
  Administered 2022-06-12: 40 mg

## 2022-06-12 NOTE — Progress Notes (Signed)
Abigail [name] presents today for follow-up of her pain in her feet.  States that the shots that we gave her last time which were just anterior to the fifth met base plantarly really worked.  She states that she continues to take 1200 gabapentin in the morning and at night.  Objective: Vital signs are stable alert and oriented x 3 no change in physical exam otherwise.  Reinjected the most painful areas today just anterior to the fifth metatarsal styloids bilaterally and this was injected plantarly.  She tolerated this well.  Assessment chronic pain bilateral foot osteoarthritis and neuropathy.  Plan: Injected 20 mg Kenalog 5 mg Marcaine bilaterally.  Also refilled her medication that had been provided by Dr. Primus Bravo which was diclofenac epolamine 1.3% patch.

## 2022-07-04 DIAGNOSIS — Z7985 Long-term (current) use of injectable non-insulin antidiabetic drugs: Secondary | ICD-10-CM

## 2022-07-04 DIAGNOSIS — I10 Essential (primary) hypertension: Secondary | ICD-10-CM

## 2022-07-04 DIAGNOSIS — F17210 Nicotine dependence, cigarettes, uncomplicated: Secondary | ICD-10-CM

## 2022-07-04 DIAGNOSIS — E1149 Type 2 diabetes mellitus with other diabetic neurological complication: Secondary | ICD-10-CM

## 2022-07-04 DIAGNOSIS — E785 Hyperlipidemia, unspecified: Secondary | ICD-10-CM

## 2022-07-17 ENCOUNTER — Ambulatory Visit: Payer: Medicare Other | Admitting: Podiatry

## 2022-07-17 ENCOUNTER — Other Ambulatory Visit: Payer: Self-pay | Admitting: Physician Assistant

## 2022-07-17 DIAGNOSIS — L409 Psoriasis, unspecified: Secondary | ICD-10-CM

## 2022-07-17 LAB — HM DIABETES EYE EXAM

## 2022-07-24 ENCOUNTER — Encounter: Payer: Self-pay | Admitting: Primary Care

## 2022-08-01 ENCOUNTER — Ambulatory Visit: Payer: Medicare Other | Admitting: Family Medicine

## 2022-08-02 ENCOUNTER — Ambulatory Visit (INDEPENDENT_AMBULATORY_CARE_PROVIDER_SITE_OTHER): Payer: Medicare Other | Admitting: Family Medicine

## 2022-08-02 VITALS — BP 130/70 | HR 75 | Temp 97.6°F | Ht 63.25 in | Wt 184.2 lb

## 2022-08-02 DIAGNOSIS — E782 Mixed hyperlipidemia: Secondary | ICD-10-CM

## 2022-08-02 DIAGNOSIS — I10 Essential (primary) hypertension: Secondary | ICD-10-CM | POA: Diagnosis not present

## 2022-08-02 DIAGNOSIS — E1149 Type 2 diabetes mellitus with other diabetic neurological complication: Secondary | ICD-10-CM

## 2022-08-02 LAB — MICROALBUMIN / CREATININE URINE RATIO
Creatinine,U: 133.3 mg/dL
Microalb Creat Ratio: 0.8 mg/g (ref 0.0–30.0)
Microalb, Ur: 1 mg/dL (ref 0.0–1.9)

## 2022-08-02 LAB — POCT GLYCOSYLATED HEMOGLOBIN (HGB A1C): Hemoglobin A1C: 6.3 % — AB (ref 4.0–5.6)

## 2022-08-02 MED ORDER — SEMAGLUTIDE (2 MG/DOSE) 8 MG/3ML ~~LOC~~ SOPN: 2.0000 mg | PEN_INJECTOR | SUBCUTANEOUS | 1 refills | Status: DC

## 2022-08-02 MED ORDER — PRAVASTATIN SODIUM 20 MG PO TABS: 20.0000 mg | ORAL_TABLET | Freq: Every day | ORAL | 1 refills | Status: DC

## 2022-08-02 MED ORDER — PRAVASTATIN SODIUM 40 MG PO TABS: 40.0000 mg | ORAL_TABLET | Freq: Every day | ORAL | 1 refills | Status: DC

## 2022-08-02 NOTE — Assessment & Plan Note (Signed)
Controlled. Cont lisinopril 20 mg.

## 2022-08-02 NOTE — Assessment & Plan Note (Signed)
Lab Results  Component Value Date   HGBA1C 6.3 (A) 08/02/2022   Controlled. Discussed increasing ozempic to 2 mg for weight loss support. Cont exercise and diet.

## 2022-08-02 NOTE — Patient Instructions (Addendum)
Diabetes - Increase ozempic to 2 mg - update if tolerating for refills - if not tolerating update and request to go back the ozempic 1 mg dose  Tdap and Shingles - check with the pharmacy

## 2022-08-02 NOTE — Assessment & Plan Note (Signed)
Elevated on last check. Cont pravastatin 20 mg.

## 2022-08-02 NOTE — Progress Notes (Signed)
Subjective:     Abigail [name] is a 71 y.o. female presenting for Follow-up (DM- 3 mo)     HPI  #Diabetes Currently taking ozempic 1 mg  Using medications without difficulties: Yes Neuropathy stable Last HgbA1c:  Lab Results  Component Value Date   HGBA1C 6.3 (A) 08/02/2022    Diabetes Health Maintenance Due:    Diabetes Health Maintenance Due  Topic Date Due   HEMOGLOBIN A1C  01/31/2023   FOOT EXAM  05/02/2023   OPHTHALMOLOGY EXAM  07/18/2023   Diet: went to [place] last weekend, has not been able lose beyond 170 Exercise: not much due to weather - heat    Review of Systems   Social History   Tobacco Use  Smoking Status Never  Smokeless Tobacco Never        Objective:    BP Readings from Last 3 Encounters:  08/02/22 130/70  05/01/22 (!) 120/50  01/24/22 (!) 150/82   Wt Readings from Last 3 Encounters:  08/02/22 184 lb 4 oz (83.6 kg)  05/01/22 179 lb 6 oz (81.4 kg)  02/05/22 172 lb (78 kg)    BP 130/70   Pulse 75   Temp 97.6 F (36.4 C) (Temporal)   Ht 5' 3.25" (1.607 m)   Wt 184 lb 4 oz (83.6 kg)   SpO2 96%   BMI 32.38 kg/m    Physical Exam Constitutional:      General: She is not in acute distress.    Appearance: She is well-developed. She is not diaphoretic.  HENT:     Right Ear: External ear normal.     Left Ear: External ear normal.     Nose: Nose normal.  Eyes:     Conjunctiva/sclera: Conjunctivae normal.  Cardiovascular:     Rate and Rhythm: Normal rate and regular rhythm.     Heart sounds: Murmur heard.  Pulmonary:     Effort: Pulmonary effort is normal. No respiratory distress.     Breath sounds: Normal breath sounds. No wheezing or rales.  Musculoskeletal:     Cervical back: Neck supple.  Skin:    General: Skin is warm and dry.     Capillary Refill: Capillary refill takes less than 2 seconds.  Neurological:     Mental Status: She is alert. Mental status is at baseline.  Psychiatric:        Mood and Affect: Mood  normal.        Behavior: Behavior normal.           Assessment & Plan:   Problem List Items Addressed This Visit       Cardiovascular and Mediastinum   Essential hypertension    Controlled. Cont lisinopril 20 mg.       Relevant Medications   pravastatin (PRAVACHOL) 40 MG tablet     Endocrine   Type 2 diabetes mellitus with neurological complications ([place]) - Primary    Lab Results  Component Value Date   HGBA1C 6.3 (A) 08/02/2022  Controlled. Discussed increasing ozempic to 2 mg for weight loss support. Cont exercise and diet.       Relevant Medications   Semaglutide, 2 MG/DOSE, 8 MG/3ML SOPN   pravastatin (PRAVACHOL) 40 MG tablet   Other Relevant Orders   POCT glycosylated hemoglobin (Hb A1C) (Completed)   Microalbumin / creatinine urine ratio     Other   Hyperlipidemia    Elevated on last check. Cont pravastatin 20 mg.       Relevant Medications  pravastatin (PRAVACHOL) 40 MG tablet     Return in about 3 months (around 11/01/2022) for TOC with Tabitha - DM and weight check.  Lesleigh Noe, MD

## 2022-08-09 ENCOUNTER — Telehealth: Payer: Self-pay

## 2022-08-09 NOTE — Chronic Care Management (AMB) (Signed)
Chronic Care Management Pharmacy Assistant   Name: Abigail [name]  MRN: [id number] DOB: 02/18/1951  Reason for Encounter:Diabetes  Disease State   Recent office visits:  08/02/22-Jessica Cody,MD(PCP)-f/u DM- Labs( normal) A1c 6.3 increase Ozempic to 47m once weekly,f/u 3 months with TOC Tabitha  Recent consult visits:  06/12/22-Max Hyatt,DPM-f/u DM polyneuropathy-injection of kenalog 559mmarcaine bilaterally,f/u 4 months  Hospital visits:  None in previous 6 months  Medications: Outpatient Encounter Medications as of 08/09/2022  Medication Sig   albuterol (VENTOLIN HFA) 108 (90 Base) MCG/ACT inhaler Inhale 2 puffs into the lungs every 4 (four) hours as needed for wheezing or shortness of breath.   Azelastine HCl 137 MCG/SPRAY SOLN as needed.   clobetasol (TEMOVATE) 0.05 % external solution Apply 1 application topically 2 (two) times daily.   COSENTYX SENSOREADY, 300 MG, 150 MG/ML SOAJ INJECT TWO PENS SUBCUTANEOUSLY EVERY 4 WEEKS. REFRIGERATE. ALLOW 15 TO 30 MINUTES AT ROOM TEMP PRIOR TO ADMINISTRATION.   diclofenac (FLECTOR) 1.3 % PTCH Place 1 patch onto the skin 2 (two) times daily.   DULoxetine (CYMBALTA) 30 MG capsule Take 1 capsule (30 mg total) by mouth daily.   esomeprazole (NEXIUM) 40 MG capsule Take 1 capsule (40 mg total) by mouth 2 (two) times daily before a meal.   gabapentin (NEURONTIN) 600 MG tablet Take 2 capsules in the morning and 2 capsules at night   glucose blood (FREESTYLE LITE) test strip Use as instructed to monitor FSBS 1x daily. Dx: E11.9   glucose monitoring kit (FREESTYLE) monitoring kit 1 each by Does not apply route as needed for other. Dispense one glucometer of choice, testing strips/lancets for once per day glucose checks.  QS for 1 month, 11 refills.   Lancets (FREESTYLE) lancets Use as instructed   lisinopril (ZESTRIL) 20 MG tablet Take 0.5 tablets (10 mg total) by mouth daily.   pravastatin (PRAVACHOL) 40 MG tablet Take 1 tablet (40 mg total) by mouth  daily.   Semaglutide, 2 MG/DOSE, 8 MG/3ML SOPN Inject 2 mg as directed once a week.   No facility-administered encounter medications on file as of 08/09/2022.     Recent Relevant Labs: Lab Results  Component Value Date/Time   HGBA1C 6.3 (A) 08/02/2022 09:01 AM   HGBA1C 7.2 (A) 05/01/2022 11:46 AM   HGBA1C 7.9 (H) 11/16/2021 10:30 AM   HGBA1C 6.0 (H) 07/02/2021 12:24 PM   MICROALBUR 1.0 08/02/2022 09:35 AM   MICROALBUR 0.3 11/16/2021 10:30 AM    Kidney Function Lab Results  Component Value Date/Time   CREATININE 0.61 05/01/2022 12:13 PM   CREATININE 0.67 11/16/2021 10:30 AM   CREATININE 0.67 07/02/2021 12:24 PM   GFR 89.02 05/01/2022 12:13 PM   GFRNONAA 66 07/09/2019 12:44 PM   GFRAA 76 07/09/2019 12:44 PM     Contacted patient on 08/20/22 to discuss diabetes disease state.    Current antihyperglycemic regimen:  Ozempic 82m68minject once weekly (Tuesdays) PCP just increased patient not picked up yet    Patient verbally confirms she is taking the above medications as directed. Yes  What diet changes have been made to improve diabetes control? Patient chooses low carbs, low sugar , fresh vegetables for meals.  What recent interventions/DTPs have been made to improve glycemic control:  PCP increased Ozempic to 2mg45mHave there been any recent hospitalizations or ED visits since last visit with CPP? No  Patient denies hypoglycemic symptoms, including Pale, Sweaty, Shaky, Hungry, Nervous/irritable, and Vision changes  Patient denies hyperglycemic symptoms, including  blurry vision, excessive thirst, fatigue, polyuria, and weakness  How often are you checking your blood sugar?  infrequently  What are your blood sugars ranging?  Fasting: 102,103,134 After meals: 115,118,116  During the week, how often does your blood glucose drop below 70? Never  Are you checking your feet daily/regularly? Yes  Adherence Review: Is the patient currently on a STATIN medication? Yes Is  the patient currently on ACE/ARB medication? Yes Does the patient have >5 day gap between last estimated fill dates? No  Care Gaps: Annual wellness visit in last year? Yes Most recent A1C reading:6.3 Most Recent BP reading: 130/70  Last eye exam / retinopathy screening:UTD Last diabetic foot exam:UTD  Counseled patient on importance of annual eye and foot exam.UTD   Summary of recommendations from last [place] visit (Date:06/11/22) Summary: CCM F/U visit -DM: A1c 7.2% (June 2023), she is doing well on Ozempic 0.5 mg weekly, though she did accidentally take 1 mg today (mixed up pens); reviewed food choices to minimize risk for nausea/vomiting -HLD/ASCVD risk: LDL improved from 160 to 130 on pravastatin 10 mg, she is tolerating 20 mg currently -HTN: pt reports BP is "good" when she checks   Recommendations/Changes made from today's visit: -No med changes; advised pt to mark X on Ozempic 1 mg pens to avoid mixup in future  Star Rating Drugs:  Medication:  Last Fill: Day Supply Lisinopril 70m 11/16/21 90  Uses 1/2 tablet Pravastatin 474m9/29/23 90 Ozempic  05/17/22 28   PCP appointment on 11/01/22 and CCM appointment on 09/10/22  LiCharlene BrookeCPP notified  VeAvel [phone]

## 2022-08-29 ENCOUNTER — Ambulatory Visit: Payer: Medicare Other | Admitting: Physician Assistant

## 2022-08-29 ENCOUNTER — Telehealth: Payer: Self-pay

## 2022-08-29 NOTE — Chronic Care Management (AMB) (Addendum)
Patient contacted me to report problems getting Ozempic medication. [facility] is on back order, [facility] was contacted and new RX called for Ozempic '2mg'$  inject once weekly.The patient will get delivery in 5-7 days and they also have the ozempic on back order. I spoke with the patient and relayed all information. We will stay in touch .  [name], CPP notified  Avel Sensor, [place]  [phone]     Pharmacist addendum: Unfortunately Ozempic backorder is effecting all pharmacies. If patient is able to find it the pharmacy can transfer it.  [name], PharmD, BCACP 08/29/22 2:13 PM

## 2022-09-05 ENCOUNTER — Telehealth: Payer: Self-pay

## 2022-09-05 NOTE — Chronic Care Management (AMB) (Signed)
    Chronic Care Management Pharmacy Assistant   Name: Abigail [name]  MRN: [id number] DOB: December 19, 1950  Reason for Encounter: Reminder Call  Medications: Outpatient Encounter Medications as of 09/05/2022  Medication Sig   albuterol (VENTOLIN HFA) 108 (90 Base) MCG/ACT inhaler Inhale 2 puffs into the lungs every 4 (four) hours as needed for wheezing or shortness of breath.   Azelastine HCl 137 MCG/SPRAY SOLN as needed.   clobetasol (TEMOVATE) 0.05 % external solution Apply 1 application topically 2 (two) times daily.   COSENTYX SENSOREADY, 300 MG, 150 MG/ML SOAJ INJECT TWO PENS SUBCUTANEOUSLY EVERY 4 WEEKS. REFRIGERATE. ALLOW 15 TO 30 MINUTES AT ROOM TEMP PRIOR TO ADMINISTRATION.   diclofenac (FLECTOR) 1.3 % PTCH Place 1 patch onto the skin 2 (two) times daily.   DULoxetine (CYMBALTA) 30 MG capsule Take 1 capsule (30 mg total) by mouth daily.   esomeprazole (NEXIUM) 40 MG capsule Take 1 capsule (40 mg total) by mouth 2 (two) times daily before a meal.   gabapentin (NEURONTIN) 600 MG tablet Take 2 capsules in the morning and 2 capsules at night   glucose blood (FREESTYLE LITE) test strip Use as instructed to monitor FSBS 1x daily. Dx: E11.9   glucose monitoring kit (FREESTYLE) monitoring kit 1 each by Does not apply route as needed for other. Dispense one glucometer of choice, testing strips/lancets for once per day glucose checks.  QS for 1 month, 11 refills.   Lancets (FREESTYLE) lancets Use as instructed   lisinopril (ZESTRIL) 20 MG tablet Take 0.5 tablets (10 mg total) by mouth daily.   pravastatin (PRAVACHOL) 40 MG tablet Take 1 tablet (40 mg total) by mouth daily.   Semaglutide, 2 MG/DOSE, 8 MG/3ML SOPN Inject 2 mg as directed once a week.   No facility-administered encounter medications on file as of 09/05/2022.   Kristine Royal Laminack was contacted to remind of upcoming telephone visit with [name] on 09/10/22 at 11:00. Patient was reminded to have any blood glucose and blood pressure  readings available for review at appointment.   Patient confirmed appointment.  Are you having any problems with your medications? No   Do you have any concerns you like to discuss with the pharmacist? No  CCM referral has been placed prior to visit?  Yes   Star Rating Drugs: Medication:  Last Fill: Day Supply Lisinopril 31m 11/16/21 90  uses 1/2 tablet Pravastatin 470m9/29/23 90 Ozempic  08/29/22 86 backorder   LiCharlene BrookeCPP notified  VeAvel [phone]

## 2022-09-10 ENCOUNTER — Ambulatory Visit (INDEPENDENT_AMBULATORY_CARE_PROVIDER_SITE_OTHER): Payer: Medicare Other | Admitting: Pharmacist

## 2022-09-10 DIAGNOSIS — E782 Mixed hyperlipidemia: Secondary | ICD-10-CM

## 2022-09-10 DIAGNOSIS — E1149 Type 2 diabetes mellitus with other diabetic neurological complication: Secondary | ICD-10-CM

## 2022-09-10 DIAGNOSIS — I10 Essential (primary) hypertension: Secondary | ICD-10-CM

## 2022-09-10 NOTE — Progress Notes (Signed)
Chronic Care Management Pharmacy Note  09/10/2022 Name:  ARNESHA SCHIRALDI MRN:  [id number] DOB:  [birth year]  Summary: CCM F/U visit -DM: A1c 6.3% (07/2022), she is doing well on Ozempic 2 mg weekly, she just received it last week due to national supply issues; she reports Fasting BG 100-115, she has not had any weight loss -HLD/ASCVD risk: LDL improved from 160 to 130 on pravastatin 10 mg, dose was increased to 40 mg on 9/29 and pt felt very fatigued, so she stopped taking it -HTN: pt reports BP is "good" when she checks  Recommendations/Changes made from today's visit: -Advised pt to restart pravastatin at 20 mg (1/2 of 40 mg) daily  Plan: -[place] will call patient 1 month for statin adherence -Pharmacist follow up televisit scheduled for 3 months -PCP appt 11/01/22 (TOC - Tabitha Dugal)    Subjective: DEVYNN HESSLER is an 71 y.o. year old female who is a primary patient of Waunita Schooner, MD.  The CCM team was consulted for assistance with disease management and care coordination needs.    Engaged with patient by telephone for follow up visit in response to provider referral for pharmacy case management and/or care coordination services.   Consent to Services:  The patient was given information about Chronic Care Management services, agreed to services, and gave verbal consent prior to initiation of services.  Please see initial visit note for detailed documentation.   Patient Care Team: Waunita Schooner, MD as PCP - General (Family Medicine) Warren Danes, PA-C as Physician Assistant (Dermatology) Charlton Haws, [facility] as Pharmacist (Pharmacist) Garrel Ridgel, DPM as Consulting Physician (Podiatry) Mauri Pole, MD as Consulting Physician (Gastroenterology) Mohammed Kindle, MD as Referring Physician (Pain Medicine)   Recent office visits: 08/02/22 Dr Einar Pheasant OV: f/u - A1c 6.3%. Taking Ozempic 1 mg. Increase to 2 mg 05/17/22 TE - Ozempic reduced back to 0.5 mg  due to stomach issues. 05/01/22 Dr Einar Pheasant OV: f/u - LDL 130. A1c 7.2%; increased Ozempic to 1 mg. Increased pravastatin to 91m.  01/24/22-Jessica Cody,MD(PCP)-f/u diarrhea with metformin-stop metformin,Start Ozempic-once weekly injection-apply mupirocin ointment to mild wound.  11/28/21-Jessica Martinez,NP([facility])-f/u cough,chest XR, Start doxycycline 1029mLast A1c 7.9%.  Patient agreeable to GLP-1a therapy.  Start Victoza and continue Metformin. 11/16/21-Jessica Martinez,NP(Fam Med) F/U DM-labs ordered,CT chest ordered  Recent consult visits: 02/27/22-Kelli Sheffield,PA(Derm)-f/u psoriasis-destruction of lesion-no medication changes. 01/16/22-Kelli Sheffield,PA(Derm)-destruction of lesion-no medication changes 01/09/22-Max Hyatt,DPM-foot injection triamcinolone Acetonide 4077m/26/23-Gregory Crisp,MD(anesthesiology) no data found 10/02/21-Kaitha Nandigam,MD(gastroenterology)f/u GERD,dysphagia-switch to dexilant 46m68mke 1 tablet daily,Use Carafate before meals and at bedtime as needed  Hospital visits: None in previous 6 months   Objective:  Lab Results  Component Value Date   CREATININE 0.61 05/01/2022   BUN 18 05/01/2022   GFR 89.02 05/01/2022   EGFR 93 11/16/2021   GFRNONAA 66 07/09/2019   GFRAA 76 07/09/2019   NA 142 05/01/2022   K 3.8 05/01/2022   CALCIUM 8.9 05/01/2022   CO2 26 05/01/2022   GLUCOSE 93 05/01/2022    Lab Results  Component Value Date/Time   HGBA1C 6.3 (A) 08/02/2022 09:01 AM   HGBA1C 7.2 (A) 05/01/2022 11:46 AM   HGBA1C 7.9 (H) 11/16/2021 10:30 AM   HGBA1C 6.0 (H) 07/02/2021 12:24 PM   GFR 89.02 05/01/2022 12:13 PM   GFR 114.93 12/26/2015 10:16 AM   MICROALBUR 1.0 08/02/2022 09:35 AM   MICROALBUR 0.3 11/16/2021 10:30 AM    Last diabetic Eye exam:  Lab Results  Component  Value Date/Time   HMDIABEYEEXA No Retinopathy 07/17/2022 12:00 AM    Last diabetic Foot exam: No results found for: "HMDIABFOOTEX"   Lab Results  Component Value Date    CHOL 198 05/01/2022   HDL 44.80 05/01/2022   LDLCALC 168 (H) 07/02/2021   LDLDIRECT 130.0 05/01/2022   TRIG 203.0 (H) 05/01/2022   CHOLHDL 4 05/01/2022       Latest Ref Rng & Units 05/01/2022   12:13 PM 07/02/2021   12:24 PM 12/01/2020   11:19 AM  Hepatic Function  Total Protein 6.0 - 8.3 g/dL 6.2  6.5  6.3   Albumin 3.5 - 5.2 g/dL 4.0     AST 0 - 37 U/L _0 ALT 0 - 35 U/L _1 Alk Phosphatase 39 - 117 U/L 71     Total Bilirubin 0.2 - 1.2 mg/dL 0.6  0.6  0.5     Lab Results  Component Value Date/Time   TSH 0.91 04/02/2017 10:12 AM   TSH 0.702 05/12/2014 09:23 AM       Latest Ref Rng & Units 07/02/2021   12:24 PM 12/01/2020   11:19 AM 09/23/2018    9:25 AM  CBC  WBC 3.8 - 10.8 Thousand/uL 6.9  6.9  7.8   Hemoglobin 11.7 - 15.5 g/dL 13.8  14.2  14.0   Hematocrit 35.0 - 45.0 % 40.8  41.7  40.8   Platelets 140 - 400 Thousand/uL 189  229  219     Lab Results  Component Value Date/Time   VD25OH 25 (L) 06/13/2017 02:40 PM    Clinical ASCVD: No  The 10-year ASCVD risk score (Arnett DK, et al., 2019) is: 31.7%   Values used to calculate the score:     Age: 71 years     Sex: Female     Is Non-Hispanic African American: No     Diabetic: Yes     Tobacco smoker: No     Systolic Blood Pressure: 638 mmHg     Is BP treated: Yes     HDL Cholesterol: 44.8 mg/dL     Total Cholesterol: 198 mg/dL       02/05/2022    8:23 AM 01/24/2022   11:27 AM 12/01/2020   10:45 AM  Depression screen PHQ 2/9  Decreased Interest 0 0 0  Down, Depressed, Hopeless 0 0 0  PHQ - 2 Score 0 0 0     Social History   Tobacco Use  Smoking Status Never  Smokeless Tobacco Never   BP Readings from Last 3 Encounters:  08/02/22 130/70  05/01/22 (!) 120/50  01/24/22 (!) 150/82   Pulse Readings from Last 3 Encounters:  08/02/22 75  05/01/22 80  01/24/22 76   Wt Readings from Last 3 Encounters:  08/02/22 184 lb 4 oz (83.6 kg)  05/01/22 179 lb 6 oz (81.4 kg)  02/05/22 172  lb (78 kg)   BMI Readings from Last 3 Encounters:  08/02/22 32.38 kg/m  05/01/22 31.52 kg/m  02/05/22 30.23 kg/m    Assessment/Interventions: Review of patient past medical history, allergies, medications, health status, including review of consultants reports, laboratory and other test data, was performed as part of comprehensive evaluation and provision of chronic care management services.   SDOH:  (Social Determinants of Health) assessments and interventions performed: No - done 02/2022 SDOH Interventions    Flowsheet Row Clinical Support from 02/05/2022 in [place] at [facility]  SDOH Interventions   Food Insecurity Interventions Intervention Not Indicated  Housing Interventions Intervention Not Indicated  Transportation Interventions Intervention Not Indicated  Financial Strain Interventions Intervention Not Indicated  Physical Activity Interventions Intervention Not Indicated, Patient Refused  Stress Interventions Intervention Not Indicated  Social Connections Interventions Intervention Not Indicated      SDOH Screenings   Food Insecurity: No Food Insecurity (02/05/2022)  Housing: Low Risk  (02/05/2022)  Transportation Needs: No Transportation Needs (02/05/2022)  Alcohol Screen: Low Risk  (02/05/2022)  Depression (PHQ2-9): Low Risk  (02/05/2022)  Financial Resource Strain: Low Risk  (02/05/2022)  Physical Activity: Insufficiently Active (02/05/2022)  Social Connections: Socially Integrated (02/05/2022)  Stress: No Stress Concern Present (02/05/2022)  Tobacco Use: Low Risk  (02/27/2022)    CCM Care Plan  Allergies  Allergen Reactions   Cinnamon     Dry throat    Statins     Medications Reviewed Today     Reviewed by Loreen Freud, CMA (Certified Medical Assistant) on 08/02/22 at [phone]  Med List Status: <None>   Medication Order Taking? Sig Documenting Provider Last Dose Status Informant  albuterol (VENTOLIN HFA) 108 (90 Base) MCG/ACT inhaler [id number] Yes Inhale  2 puffs into the lungs every 4 (four) hours as needed for wheezing or shortness of breath. Eulogio Bear, NP Taking Active   Azelastine HCl 137 MCG/SPRAY SOLN [id number] Yes as needed. [provider] Taking Active   clobetasol (TEMOVATE) 0.05 % external solution [id number] Yes Apply 1 application topically 2 (two) times daily. Buelah Manis, Modena Nunnery, MD Taking Active   COSENTYX SENSOREADY, 300 MG, 150 MG/ML Darden Palmer [id number] Yes INJECT TWO PENS SUBCUTANEOUSLY EVERY 4 WEEKS. REFRIGERATE. ALLOW 15 TO 30 MINUTES AT ROOM TEMP PRIOR TO ADMINISTRATION. Warren Danes, PA-C Taking Active   diclofenac (FLECTOR) 1.3 % [facility] [id number] Yes Place 1 patch onto the skin 2 (two) times daily. Edison, Max T, DPM Taking Active   DULoxetine (CYMBALTA) 30 MG capsule [id number] Yes Take 1 capsule (30 mg total) by mouth daily. [place], Max T, DPM Taking Active   esomeprazole (NEXIUM) 40 MG capsule [id number] Yes Take 1 capsule (40 mg total) by mouth 2 (two) times daily before a meal. Nandigam, Venia Minks, MD Taking Active   gabapentin (NEURONTIN) 600 MG tablet [id number] Yes Take 2 capsules in the morning and 2 capsules at night [place], Max T, DPM Taking Active   glucose blood (FREESTYLE LITE) test strip [id number] Yes Use as instructed to monitor FSBS 1x daily. Dx: E11.9 Lesleigh Noe, MD Taking Active   glucose monitoring kit (FREESTYLE) monitoring kit [id number] Yes 1 each by Does not apply route as needed for other. Dispense one glucometer of choice, testing strips/lancets for once per day glucose checks.  QS for 1 month, 11 refills. Wendie Agreste, MD Taking Active Self  Lancets (FREESTYLE) lancets [id number] Yes Use as instructed Alycia Rossetti, MD Taking Active   lisinopril (ZESTRIL) 20 MG tablet [id number] Yes Take 0.5 tablets (10 mg total) by mouth daily. Eulogio Bear, NP Taking Active   pravastatin (PRAVACHOL) 20 MG tablet [id number] Yes Take 1 tablet (20 mg total) by mouth daily. Lesleigh Noe, MD  Taking Active   Semaglutide,0.25 or 0.5MG/DOS, 2 MG/3ML [facility] [id number] Yes Inject 0.5 mg into the skin once a week.  Patient taking differently: Inject 1 mg into the skin once a week.   Lesleigh Noe, MD Taking Active             Patient  Active Problem List   Diagnosis Date Noted   Risk of exposure to Lyme disease 05/01/2022   Wound healing, delayed 10/23/7587   Eosinophilic esophagitis 32/54/9826   Dysphagia 09/15/2019   Bronchiectasis without complication ([place]) 41/58/3094   Type 2 diabetes mellitus with neurological complications ([place]) 07/68/0881   Diabetic neuropathy ([place]) 04/02/2017   COPD (chronic obstructive pulmonary disease) ([place]) 04/02/2017   Aortic valve sclerosis 04/02/2017   Pulmonary nodule 05/21/2013   IRRITABLE BOWEL SYNDROME 05/18/2008   Hyperlipidemia 05/17/2008   Essential hypertension 05/17/2008   HEMORRHOIDS 05/17/2008   GERD 05/17/2008   FATTY LIVER DISEASE 05/17/2008   PSORIASIS 05/17/2008   Osteoarthritis 05/17/2008   STRESS INCONTINENCE 05/17/2008    Immunization History  Administered Date(s) Administered   Pneumococcal Conjugate-13 04/02/2017   Pneumococcal Polysaccharide-23 01/19/2018    Conditions to be addressed/monitored:  Hypertension, Hyperlipidemia, and Diabetes, Neuropathy  Care Plan : [place]  Updates made by Charlton Haws, [place] since 09/10/2022 12:00 AM     Problem: Hypertension, Hyperlipidemia, and Diabetes, Neuropathy   Priority: High     Long-Range Goal: Disease mgmt   Start Date: 03/19/2022  Expected End Date: 03/20/2023  This Visit's Progress: On track  Recent Progress: On track  Priority: High  Note:   Current Barriers:  Unable to maintain control of DM/HLD  Pharmacist Clinical Goal(s):  Patient will maintain control of DM/HLD as evidenced by improved labwork  through collaboration with PharmD and provider.   Interventions: 1:1 collaboration with Lesleigh Noe, MD regarding development and  update of comprehensive plan of care as evidenced by provider attestation and co-signature Inter-disciplinary care team collaboration (see longitudinal plan of care) Comprehensive medication review performed; medication list updated in electronic medical record  Hypertension (BP goal <130/80) -Not ideally controlled - BP above goal in recent OV, however pt reports home BP has been "normal" when she checked, she is unable to provide log today -Current home readings: "good" -Current treatment: Lisinopril 20 mg - 1/2 tab daily - Appropriate, Effective, Safe, Accessible -Medications previously tried: n/a  -Educated on BP goals and benefits of medications for prevention of heart attack, stroke and kidney damage; Importance of home blood pressure monitoring; -Counseled to monitor BP at home daily -Recommended to continue current medication  Hyperlipidemia: (LDL goal < 70) -Query controlled - LDL 130 (04/2022) improved from 168 (06/2021) with pravastatin 10 mg (23% reduction); pravastatin has been increased to 40 mg 9/29 but pt felt very fatigued and weak so she stopped taking it -ASCVD risk 38.5% is very high; query statin intolerance - c/o fatigue, weakness -Current treatment: Pravastatin 40 mg daily - Appropriate, Effective, Query Safe -Medications previously tried: ezetimibe, fenofibrate, rosuvastatin 5 mg weekly, simvastatin 20 mg -Educated on Cholesterol goals; Benefits of statin for ASCVD risk reduction; -Reviewed DM statin recommendations -Recommend to restart pravastatin at 20 mg (1/2 of 40 mg)  Diabetes (A1c goal <7%) -Controlled - A1c 6.3% (04/2022) at goal; pt received Ozempic 2 mg dose last week and has been tolerating fine -Current home glucose readings: Fasting: 101-116 -Denies hypoglycemic/hyperglycemic symptoms -Current medications: Ozempic 2 mg weekly - Appropriate, Effective, Safe, Accessible Testing supplies -Medications previously tried: Trulicity, Jardiance (yeast infxn),  glipizide, Victoza, metformin (GI), Ozempic 1 mg (GI) -Current meal patterns: increased fruit and vegetables, water -Current exercise: walking -Educated on A1c and blood sugar goals;Complications of diabetes including kidney damage, retinal damage, and cardiovascular disease; -Assessed pt finances - she is able to pay for Ozempic currently ($47/month); discussed PAP  is available if needed -Recommended to continue current medication  Neuropathy (Goal: manage symptoms) -Controlled - per pt report -Follows with DPM Hyatt -Current treatment  Duloxetine 30 mg daily - Appropriate, Effective, Safe, Accessible Gabapentin 600 mg - 2 cap BID - Appropriate, Effective, Safe, Accessible -Medications previously tried: n/a  -Recommended to continue current medication  GERD (Goal: manage symptoms) -Controlled - per pt report -Hx esophageal stretching -Current treatment  Esomeprazole 40 mg BID - Appropriate, Effective, Safe, Accessible -Medications previously tried: n/a  -Recommended to continue current medication  Patient Goals/Self-Care Activities Patient will:  - take medications as prescribed as evidenced by patient report and record review focus on medication adherence by routine check blood pressure daily, document, and provide at future appointments     Medication Assistance: None required.  Patient affirms current coverage meets needs.  Compliance/Adherence/Medication fill history: Care Gaps: None  Star-Rating Drugs: Lisinopril - PDC 0%; LF 1/ Ozempic - PDC 91% Pravastatin - PDC 100%  Medication Access: Within the past 30 days, how often has patient missed a dose of medication? 0 Is a pillbox or other method used to improve adherence? Yes  Factors that may affect medication adherence? no barriers identified Are meds synced by current pharmacy? No  Are meds delivered by current pharmacy? No  Does patient experience delays in picking up medications due to transportation  concerns? Yes   [facility] Reviewed: Is patient disadvantaged to use [facility]?: Yes  Current Rx insurance plan: BCBS PDP Name and location of Current pharmacy:  [facility] [place], [place] - [address] [place] [address] Phone: [phone] Fax: [phone]  [facility] [place] [address] [place] [address] Phone: [phone] Fax: [phone]  [facility] services reviewed with patient today?: No  Patient requests to transfer care to [facility]?: No  Reason patient declined to change pharmacies: Disadvantaged due to insurance/mail order   Care Plan and Follow Up Patient Decision:  Patient agrees to Care Plan and Follow-up.  Plan: Telephone follow up appointment with care management team member scheduled for:  3 months  [name], PharmD, BCACP Clinical Pharmacist [facility] at [facility] [phone]

## 2022-09-12 ENCOUNTER — Ambulatory Visit (INDEPENDENT_AMBULATORY_CARE_PROVIDER_SITE_OTHER): Payer: Medicare Other | Admitting: Family Medicine

## 2022-09-12 ENCOUNTER — Encounter: Payer: Self-pay | Admitting: Family Medicine

## 2022-09-12 VITALS — BP 110/62 | HR 72 | Temp 97.8°F | Ht 63.25 in | Wt 186.0 lb

## 2022-09-12 DIAGNOSIS — L408 Other psoriasis: Secondary | ICD-10-CM | POA: Diagnosis not present

## 2022-09-12 DIAGNOSIS — J019 Acute sinusitis, unspecified: Secondary | ICD-10-CM

## 2022-09-12 MED ORDER — FLUCONAZOLE 150 MG PO TABS: 150.0000 mg | ORAL_TABLET | ORAL | 0 refills | Status: DC

## 2022-09-12 MED ORDER — AMOXICILLIN-POT CLAVULANATE 875-125 MG PO TABS: 1.0000 | ORAL_TABLET | Freq: Two times a day (BID) | ORAL | 0 refills | Status: DC

## 2022-09-12 NOTE — Patient Instructions (Addendum)
I put in the dermatology referral. If you don't get an appointment soon enough, then let us know.   Start augmentin.  Rest and fluids.  Use diflucan if needed.  Take care.  Glad to see you.

## 2022-09-12 NOTE — Progress Notes (Signed)
duration of symptoms: ~5 weeks.  Tried mult OTC meds w/o relief.   rhinorrhea: yes congestion: yes ear pain: no sore throat: no cough:no myalgias: some diffuse aches.   Facial pain.   No known fevers.    She has been off cosentyx for 2 months.  Prev derm clinic closed.  She needs referral.  Done at [place].    Per HPI unless specifically indicated in ROS section   Meds, vitals, and allergies reviewed.   GEN: nad, alert and oriented HEENT: mucous membranes moist, TM w/o erythema, nasal epithelium injected, OP with cobblestoning, sinuses ttp x4.   NECK: supple w/o LA CV: rrr. SEM notes at baseline.   PULM: ctab, no inc wob ABD: soft, +bs EXT: no edema

## 2022-09-13 ENCOUNTER — Telehealth: Payer: Self-pay | Admitting: Family Medicine

## 2022-09-13 NOTE — Telephone Encounter (Signed)
Pt called requesting for a call back from [place] regarding the meds, Semaglutide, 2 MG/DOSE, 8 MG/3ML SOPN. Pt has some questions & concerns about meds. Call back # [phone]

## 2022-09-13 NOTE — Telephone Encounter (Signed)
Patient was concerned because she looked at her drug list provided at her recent appointment and did not recognize "semaglutide", advised that this is just the generic name for Ozempic. Pt appreciated info, nothing further needed.

## 2022-09-15 DIAGNOSIS — J019 Acute sinusitis, unspecified: Secondary | ICD-10-CM | POA: Insufficient documentation

## 2022-09-15 DIAGNOSIS — J0110 Acute frontal sinusitis, unspecified: Secondary | ICD-10-CM | POA: Insufficient documentation

## 2022-09-15 NOTE — Assessment & Plan Note (Signed)
  Start augmentin.  Rest and fluids.  Use diflucan if needed.  Okay for outpatient follow-up.  Routine cautions given to patient.

## 2022-09-15 NOTE — Assessment & Plan Note (Signed)
Refer to dermatology 

## 2022-09-18 NOTE — Patient Instructions (Signed)
Visit Information  Phone number for Pharmacist: [phone]   Goals Addressed   None     Patient Care Plan: CCM Pharmacy Care Plan     Problem Identified: Hypertension, Hyperlipidemia, and Diabetes, Neuropathy   Priority: High     Long-Range Goal: Disease mgmt   Start Date: 03/19/2022  Expected End Date: 03/20/2023  This Visit's Progress: On track  Recent Progress: On track  Priority: High  Note:   Current Barriers:  Unable to maintain control of DM/HLD  Pharmacist Clinical Goal(s):  Patient will maintain control of DM/HLD as evidenced by improved labwork  through collaboration with PharmD and provider.   Interventions: 1:1 collaboration with Lesleigh Noe, MD regarding development and update of comprehensive plan of care as evidenced by provider attestation and co-signature Inter-disciplinary care team collaboration (see longitudinal plan of care) Comprehensive medication review performed; medication list updated in electronic medical record  Hypertension (BP goal <130/80) -Not ideally controlled - BP above goal in recent OV, however pt reports home BP has been "normal" when she checked, she is unable to provide log today -Current home readings: "good" -Current treatment: Lisinopril 20 mg - 1/2 tab daily - Appropriate, Effective, Safe, Accessible -Medications previously tried: n/a  -Educated on BP goals and benefits of medications for prevention of heart attack, stroke and kidney damage; Importance of home blood pressure monitoring; -Counseled to monitor BP at home daily -Recommended to continue current medication  Hyperlipidemia: (LDL goal < 70) -Query controlled - LDL 130 (04/2022) improved from 168 (06/2021) with pravastatin 10 mg (23% reduction); pravastatin has been increased to 40 mg 9/29 but pt felt very fatigued and weak so she stopped taking it -ASCVD risk 38.5% is very high; query statin intolerance - c/o fatigue, weakness -Current treatment: Pravastatin 40  mg daily - Appropriate, Effective, Query Safe -Medications previously tried: ezetimibe, fenofibrate, rosuvastatin 5 mg weekly, simvastatin 20 mg -Educated on Cholesterol goals; Benefits of statin for ASCVD risk reduction; -Reviewed DM statin recommendations -Recommend to restart pravastatin at 20 mg (1/2 of 40 mg)  Diabetes (A1c goal <7%) -Controlled - A1c 6.3% (04/2022) at goal; pt received Ozempic 2 mg dose last week and has been tolerating fine -Current home glucose readings: Fasting: 101-116 -Denies hypoglycemic/hyperglycemic symptoms -Current medications: Ozempic 2 mg weekly - Appropriate, Effective, Safe, Accessible Testing supplies -Medications previously tried: Trulicity, Jardiance (yeast infxn), glipizide, Victoza, metformin (GI), Ozempic 1 mg (GI) -Current meal patterns: increased fruit and vegetables, water -Current exercise: walking -Educated on A1c and blood sugar goals;Complications of diabetes including kidney damage, retinal damage, and cardiovascular disease; -Assessed pt finances - she is able to pay for Ozempic currently ($47/month); discussed PAP is available if needed -Recommended to continue current medication  Neuropathy (Goal: manage symptoms) -Controlled - per pt report -Follows with DPM Hyatt -Current treatment  Duloxetine 30 mg daily - Appropriate, Effective, Safe, Accessible Gabapentin 600 mg - 2 cap BID - Appropriate, Effective, Safe, Accessible -Medications previously tried: n/a  -Recommended to continue current medication  GERD (Goal: manage symptoms) -Controlled - per pt report -Hx esophageal stretching -Current treatment  Esomeprazole 40 mg BID - Appropriate, Effective, Safe, Accessible -Medications previously tried: n/a  -Recommended to continue current medication  Patient Goals/Self-Care Activities Patient will:  - take medications as prescribed as evidenced by patient report and record review focus on medication adherence by routine check  blood pressure daily, document, and provide at future appointments      The patient verbalized understanding of instructions, educational materials, and care  plan provided today and DECLINED offer to receive copy of patient instructions, educational materials, and care plan.  Telephone follow up appointment with pharmacy team member scheduled for:   [name], PharmD, BCACP Clinical Pharmacist [facility] at [facility] [phone]

## 2022-10-03 DIAGNOSIS — I10 Essential (primary) hypertension: Secondary | ICD-10-CM

## 2022-10-03 DIAGNOSIS — E785 Hyperlipidemia, unspecified: Secondary | ICD-10-CM

## 2022-10-03 DIAGNOSIS — E1140 Type 2 diabetes mellitus with diabetic neuropathy, unspecified: Secondary | ICD-10-CM

## 2022-10-03 DIAGNOSIS — Z7985 Long-term (current) use of injectable non-insulin antidiabetic drugs: Secondary | ICD-10-CM

## 2022-10-05 ENCOUNTER — Other Ambulatory Visit: Payer: Self-pay | Admitting: Podiatry

## 2022-10-10 ENCOUNTER — Telehealth: Payer: Self-pay

## 2022-10-10 NOTE — Chronic Care Management (AMB) (Addendum)
Chronic Care Management Pharmacy Assistant   Name: Abigail [name]  MRN: [id number] DOB: [birth year]   Reason for Encounter: Adherence    Recent office visits:  09/12/22-Graham Duncan,MD(fam med)-acute flare psoriasis-dermatology referral,start augmentin 875-125 mg use diflucan 120m if needed  Recent consult visits:  10/16/22-Max Hyatt,DPM-f/u capsulitis, cortisone injection given,f/u 4 months  Hospital visits:  None in previous 6 months  Medications: Outpatient Encounter Medications as of 10/10/2022  Medication Sig   albuterol (VENTOLIN HFA) 108 (90 Base) MCG/ACT inhaler Inhale 2 puffs into the lungs every 4 (four) hours as needed for wheezing or shortness of breath.   amoxicillin-clavulanate (AUGMENTIN) 875-125 MG tablet Take 1 tablet by mouth 2 (two) times daily.   Azelastine HCl 137 MCG/SPRAY SOLN as needed.   clobetasol (TEMOVATE) 0.05 % external solution Apply 1 application topically 2 (two) times daily.   COSENTYX SENSOREADY, 300 MG, 150 MG/ML SOAJ INJECT TWO PENS SUBCUTANEOUSLY EVERY 4 WEEKS. REFRIGERATE. ALLOW 15 TO 30 MINUTES AT ROOM TEMP PRIOR TO ADMINISTRATION.   diclofenac (FLECTOR) 1.3 % PTCH Place 1 patch onto the skin 2 (two) times daily.   DULoxetine (CYMBALTA) 30 MG capsule Take 1 capsule (30 mg total) by mouth daily.   esomeprazole (NEXIUM) 40 MG capsule Take 1 capsule (40 mg total) by mouth 2 (two) times daily before a meal.   fluconazole (DIFLUCAN) 150 MG tablet Take 1 tablet (150 mg total) by mouth once a week.   gabapentin (NEURONTIN) 600 MG tablet TAKE 2 TABLETS BY MOUTH IN THE MORNING AND AT NIGHT   glucose blood (FREESTYLE LITE) test strip Use as instructed to monitor FSBS 1x daily. Dx: E11.9   glucose monitoring kit (FREESTYLE) monitoring kit 1 each by Does not apply route as needed for other. Dispense one glucometer of choice, testing strips/lancets for once per day glucose checks.  QS for 1 month, 11 refills.   Lancets (FREESTYLE) lancets Use as instructed    lisinopril (ZESTRIL) 20 MG tablet Take 0.5 tablets (10 mg total) by mouth daily.   pravastatin (PRAVACHOL) 40 MG tablet Take 1 tablet (40 mg total) by mouth daily. (Patient taking differently: Take 20 mg by mouth daily.)   Semaglutide, 2 MG/DOSE, 8 MG/3ML SOPN Inject 2 mg as directed once a week.   No facility-administered encounter medications on file as of 10/10/2022.   [place] adherence to medications.  Patient is not more than 5 days past due for refill on the following medications per chart history:  Drug Name / Strength / Sig Pravastatin 436m take 1/2 tablet (2048mdaily  Patient is aware I am calling to review medications that show they are past due per insurance refill data. Patient states she has not taken pravastatin for a long time now. The medication makes her feel bad, she stays tired, likes to nap when on this medication. Patient reports she stays active and did not like the effects of the pravastatin had on her body.  Drug name pravastatin  is indicated for cholesterol.   How are you taking your Pravastatin?  Declines to take medication      How many tablets per day? 0   What concerns do you have about this medication? Patient reports side effects of tiredness, sleepiness.   How often do you forget or accidentally miss a dose? Currently the patient is not taking the pravastatin .   Do you use a pillbox? Yes   Are you having any problems getting this medication from  your pharmacy? No   Has the cost of this medication been a concern? No   When was the prescription last filled? (Ask for date on current bottle if they aren't sure) 08/02/22  90ds  Verified the patient does not have any expired medication (bottles > 93 year old) and asked them to dispose of any medications over 70 year old..   Do you have excess medication on hand? No   Summary of recommendations from last CCM Pharmacy visit (Date:09/10/22) Summary: CCM F/U visit -DM: A1c 6.3%  (07/2022), she is doing well on Ozempic 2 mg weekly, she just received it last week due to national supply issues; she reports Fasting BG 100-115, she has not had any weight loss -HLD/ASCVD risk: LDL improved from 160 to 130 on pravastatin 10 mg, dose was increased to 40 mg on 9/29 and pt felt very fatigued, so she stopped taking it -HTN: pt reports BP is "good" when she checks   Recommendations/Changes made from today's visit: -Advised pt to restart pravastatin at 20 mg (1/2 of 40 mg) daily  Counseled patient on:  Importance of taking medication daily without missed doses, Benefits of adherence packaging or a pillbox, and Access to CCM team for any cost, medication or pharmacy concerns.  Care Gaps: Annual wellness visit in last year? Yes Most Recent BP reading:110/62  09/12/22  If Diabetic: Most recent A1C reading:6.3 Last eye exam / retinopathy screening:UTD Last diabetic foot exam:UTD  STAR Medications Name   Last fill date   DS Lisinopril 74m 11/16/21 90 Uses 1/2 tablet Pravastatin 249m9/29/23 90    Upcoming appointments: PCP appointment on 11/08/22 TOC and CCM appointment on 12/13/22  LiCharlene BrookeCPP notified  VeAvel [phone]

## 2022-10-16 ENCOUNTER — Encounter: Payer: Self-pay | Admitting: Podiatry

## 2022-10-16 ENCOUNTER — Ambulatory Visit (INDEPENDENT_AMBULATORY_CARE_PROVIDER_SITE_OTHER): Payer: Medicare Other | Admitting: Podiatry

## 2022-10-16 DIAGNOSIS — M778 Other enthesopathies, not elsewhere classified: Secondary | ICD-10-CM

## 2022-10-16 MED ORDER — TRIAMCINOLONE ACETONIDE 40 MG/ML IJ SUSP
40.0000 mg | Freq: Once | INTRAMUSCULAR | Status: AC
  Administered 2022-10-16: 40 mg

## 2022-10-16 NOTE — Progress Notes (Signed)
She presents today for follow-up of capsulitis bilaterally she states are not any worse she says but I really look forward to getting these injections.  Objective: Vital signs stable alert oriented x 3 interdigital pain at the metatarsal phalangeal joints bilaterally.  Assessment: Capsulitis neuritis bursitis second and third interdigital spaces of the bilateral foot.  Plan: Injected these today 10 mg Kenalog 5 mg Marcaine point maximal tenderness.  I will follow-up with her on an as-needed basis or in 4 months

## 2022-11-01 ENCOUNTER — Ambulatory Visit: Payer: Medicare Other | Admitting: Family

## 2022-11-01 NOTE — Addendum Note (Signed)
Addended by: Charlton Haws on: 11/01/2022 09:57 AM   Modules accepted: Orders

## 2022-11-01 NOTE — Telephone Encounter (Addendum)
Patient has stopped taking pravastatin because it makes her "feel bad", she reports fatigue and declines to restart medication at a lower dose. She has also failed rosuvastatin 5 mg weekly and simvastatin 20 mg previously.   Due to history of diabetes she falls into SUPD Medicare metric (Statin use in diabetes). If she is statin-intolerant she can be removed from the metric. Pt has TOC appt 11/08/22, I recommend to add ICD-code T46.6X5A (adverse effect of antihyperlipidemic drug) to the upcoming visit to remove her from the measure for the year.  Her LDL goal is < 70 due to history of diabetes and ASCVD risk 23%. Her last LDL was 130 while taking pravastatin 10 mg. Next options for lipid control include Zetia, PCSK9-inhibitors or Nexletol. Recommend to discuss options at upcoming appt.

## 2022-11-08 ENCOUNTER — Encounter: Payer: Self-pay | Admitting: Family

## 2022-11-08 ENCOUNTER — Ambulatory Visit (INDEPENDENT_AMBULATORY_CARE_PROVIDER_SITE_OTHER): Payer: Medicare Other | Admitting: Family

## 2022-11-08 ENCOUNTER — Ambulatory Visit (INDEPENDENT_AMBULATORY_CARE_PROVIDER_SITE_OTHER)
Admission: RE | Admit: 2022-11-08 | Discharge: 2022-11-08 | Disposition: A | Discharge status: Home / Self Care | Payer: Medicare Other | Source: Ambulatory Visit | Attending: Family | Admitting: Family

## 2022-11-08 VITALS — BP 122/68 | HR 67 | Ht 63.25 in | Wt 182.0 lb

## 2022-11-08 DIAGNOSIS — R011 Cardiac murmur, unspecified: Secondary | ICD-10-CM

## 2022-11-08 DIAGNOSIS — D3502 Benign neoplasm of left adrenal gland: Secondary | ICD-10-CM | POA: Diagnosis not present

## 2022-11-08 DIAGNOSIS — R911 Solitary pulmonary nodule: Secondary | ICD-10-CM | POA: Diagnosis not present

## 2022-11-08 DIAGNOSIS — K760 Fatty (change of) liver, not elsewhere classified: Secondary | ICD-10-CM | POA: Diagnosis not present

## 2022-11-08 DIAGNOSIS — J479 Bronchiectasis, uncomplicated: Secondary | ICD-10-CM | POA: Diagnosis not present

## 2022-11-08 DIAGNOSIS — E1149 Type 2 diabetes mellitus with other diabetic neurological complication: Secondary | ICD-10-CM | POA: Diagnosis not present

## 2022-11-08 DIAGNOSIS — H6192 Disorder of left external ear, unspecified: Secondary | ICD-10-CM

## 2022-11-08 DIAGNOSIS — Z9189 Other specified personal risk factors, not elsewhere classified: Secondary | ICD-10-CM

## 2022-11-08 DIAGNOSIS — J42 Unspecified chronic bronchitis: Secondary | ICD-10-CM

## 2022-11-08 DIAGNOSIS — R0602 Shortness of breath: Secondary | ICD-10-CM | POA: Diagnosis not present

## 2022-11-08 DIAGNOSIS — R0609 Other forms of dyspnea: Secondary | ICD-10-CM | POA: Diagnosis not present

## 2022-11-08 DIAGNOSIS — E1142 Type 2 diabetes mellitus with diabetic polyneuropathy: Secondary | ICD-10-CM

## 2022-11-08 DIAGNOSIS — L409 Psoriasis, unspecified: Secondary | ICD-10-CM

## 2022-11-08 DIAGNOSIS — J0141 Acute recurrent pansinusitis: Secondary | ICD-10-CM

## 2022-11-08 DIAGNOSIS — E782 Mixed hyperlipidemia: Secondary | ICD-10-CM | POA: Diagnosis not present

## 2022-11-08 DIAGNOSIS — G620 Drug-induced polyneuropathy: Secondary | ICD-10-CM

## 2022-11-08 DIAGNOSIS — I10 Essential (primary) hypertension: Secondary | ICD-10-CM

## 2022-11-08 DIAGNOSIS — T466X5A Adverse effect of antihyperlipidemic and antiarteriosclerotic drugs, initial encounter: Secondary | ICD-10-CM

## 2022-11-08 DIAGNOSIS — Z789 Other specified health status: Secondary | ICD-10-CM

## 2022-11-08 LAB — COMPREHENSIVE METABOLIC PANEL
ALT: 19 U/L (ref 0–35)
AST: 20 U/L (ref 0–37)
Albumin: 4.4 g/dL (ref 3.5–5.2)
Alkaline Phosphatase: 86 U/L (ref 39–117)
BUN: 15 mg/dL (ref 6–23)
CO2: 31 mEq/L (ref 19–32)
Calcium: 10 mg/dL (ref 8.4–10.5)
Chloride: 102 mEq/L (ref 96–112)
Creatinine, Ser: 0.7 mg/dL (ref 0.40–1.20)
GFR: 85.81 mL/min (ref >60.00)
Glucose, Bld: 100 mg/dL — ABNORMAL HIGH (ref 70–99)
Potassium: 4.5 mEq/L (ref 3.5–5.1)
Sodium: 141 mEq/L (ref 135–145)
Total Bilirubin: 0.6 mg/dL (ref 0.2–1.2)
Total Protein: 7.2 g/dL (ref 6.0–8.3)

## 2022-11-08 LAB — LIPID PANEL
Cholesterol: 282 mg/dL — ABNORMAL HIGH (ref 0–200)
HDL: 51 mg/dL (ref >39.00)
NonHDL: 231.48
Total CHOL/HDL Ratio: 6
Triglycerides: 224 mg/dL — ABNORMAL HIGH (ref 0.0–149.0)
VLDL: 44.8 mg/dL — ABNORMAL HIGH (ref 0.0–40.0)

## 2022-11-08 LAB — POCT GLYCOSYLATED HEMOGLOBIN (HGB A1C): Hemoglobin A1C: 6 % — AB (ref 4.0–5.6)

## 2022-11-08 LAB — LDL CHOLESTEROL, DIRECT: Direct LDL: 204 mg/dL

## 2022-11-08 NOTE — Progress Notes (Unsigned)
Established Patient Office Visit  Subjective:  Patient ID: Abigail [name], female    DOB: Feb 27, 1951  Age: 72 y.o. MRN: [id number]  CC:  Chief Complaint  Patient presents with   Diabetes   Establish Care    HPI Abigail [name] [facility] is here today for follow up as well as transfer of care.  Prior provider was Dr. Waunita Schooner.   DM2: semaglutide 2 mg once weekly and tolerating well.  Last eye exam:   Lab Results  Component Value Date   HGBA1C 6.0 (A) 11/08/2022   Psoriasis: cocentyx, needs another dermatologist as her most recent dermatologist office closed.   Dr. Milinda Pointer, podiatrist, peripheral neuropathy sees him and gives her diclofenac patch which is helpful for her.   Bronchiectasis and pulmonary nodule, new finding in 2019. Pt had repeat CT chest ordered 1/23 however did not complete as scheduled. We will need to repeat. Did see pulmonary in 2019 however was supposed to go back for f/u in four weeks and does not look like she went for f/u. Was instructed to stop lisinopril start micardis, she also does not remember doing this as she is still on lisinopril today. Denies daily cough. Does have recurrent sinus congestion. She does find she has increasing Doe. Walking to mailbox she notices increased sob. No pedal edema per pt.   Chronic recurrent sinusitis: finds se has this occur at least 2-3 times a year, usually more. She uses azelastine with mild relief. She has had this ongoing for years. In 2019 recommended for recurrent mucous congestion and cough to be seen by ENT by pulmonary however pt did not make appt. They dad suggested CT sinus.   Hyperlipidemia: stopped pravastatin made her 'feel bad' with fatigue. Failed crestor 5 mg weekly and simvastatin 20 mg as well.  Lab Results  Component Value Date   CHOL 282 (H) 11/08/2022   HDL 51.00 11/08/2022   LDLCALC 168 (H) 07/02/2021   LDLDIRECT 204.0 11/08/2022   TRIG 224.0 (H) 11/08/2022   CHOLHDL 6 11/08/2022   Tick bite a few years  ago with bulls eye presentation. She wants to be tested for lyme.   Past Medical History:  Diagnosis Date   Allergy    Anxiety    Arthritis    hands, knees   Cancer ([place])    Cataract    surgery to remove -bilateral   COPD (chronic obstructive pulmonary disease) ([place])    Depression    Diabetes mellitus ([place])    a. A1c 6.6 in 05/2013 indicating new dx.   Fatty liver    Female stress incontinence    GERD (gastroesophageal reflux disease)    Heart murmur    no problems   Hemorrhoids    History of kidney stones    surgery to remove   Hyperlipidemia    Hyperplastic colon polyp 06/07/2008   Hypertension    Hypertriglyceridemia    IBS (irritable bowel syndrome)    Lung nodule    a. 61m left lung nodule by CT 05/2013.   Neuromuscular disorder (HCC)    neuropathy feet   Obesity    Psoriasis    SCCA (squamous cell carcinoma) of skin 01/16/2022   Left Malar Cheek (in situ) (tx p bx)   Skin cancer    Squamous cell carcinoma of skin 01/16/2022   Right Breast (in situ) (curet and 5FU)    Past Surgical History:  Procedure Laterality Date   CATARACT EXTRACTION W/PHACO Left 10/06/2014   Procedure:  CATARACT EXTRACTION PHACO AND INTRAOCULAR LENS PLACEMENT LEFT EYE;  Surgeon: Tonny Branch, MD;  Location: [facility];  Service: Ophthalmology;  Laterality: Left;  CDE 7.35   CATARACT EXTRACTION W/PHACO Right 11/14/2014   Procedure: CATARACT EXTRACTION PHACO AND INTRAOCULAR LENS PLACEMENT RIGHT EYE;  Surgeon: Tonny Branch, MD;  Location: [facility];  Service: Ophthalmology;  Laterality: Right;  CDE:6.39   COLONOSCOPY  06/2008   hx polyps   CYSTOSCOPY W/ RETROGRADES Right 01/21/2017   Procedure: CYSTOSCOPY WITH RETROGRADE PYELOGRAM;  Surgeon: Hollice Espy, MD;  Location: [facility];  Service: Urology;  Laterality: Right;   CYSTOSCOPY/URETEROSCOPY/HOLMIUM LASER/STENT PLACEMENT Left 01/21/2017   Procedure: CYSTOSCOPY/URETEROSCOPY/HOLMIUM LASER/STENT PLACEMENT;  Surgeon: Hollice Espy, MD;  Location: [facility];   Service: Urology;  Laterality: Left;   DORSAL COMPARTMENT RELEASE Left 06/15/2015   Procedure: LEFT WRIST DEQUERVAINS;  Surgeon: Ninetta Lights, MD;  Location: [facility];  Service: Orthopedics;  Laterality: Left;   HERNIA REPAIR     MOUTH SURGERY     tooth ext   NASAL SINUS SURGERY     SHOULDER SURGERY     right   TONSILLECTOMY     UPPER GI ENDOSCOPY     normal per patient   VENTRAL HERNIA REPAIR      Family History  Problem Relation Age of Onset   Breast cancer Mother    Colon polyps Maternal Aunt    Heart disease Maternal Grandfather    Kidney cancer Neg Hx    Bladder Cancer Neg Hx    Colon cancer Neg Hx    Stomach cancer Neg Hx    Rectal cancer Neg Hx    Pancreatic cancer Neg Hx    Esophageal cancer Neg Hx     Social History   Socioeconomic History   Marital status: Married    Spouse name: [name]   Number of children: 2   Years of education: trade school   Highest education level: Not on file  Occupational History   Occupation: retired  Tobacco Use   Smoking status: Never   Smokeless tobacco: Never  Vaping Use   Vaping Use: Never used  Substance and Sexual Activity   Alcohol use: No   Drug use: No   Sexual activity: Yes    Birth control/protection: Post-menopausal  Other Topics Concern   Not on file  Social History Narrative   01/24/22   From: the area   Living: with [name], husband (1968)   Work: retired - school bus Librarian, academic      Family: 2 children - [name] and [name] - 4 grandchildren      Enjoys: farm life, play with grandchildren, travel      Exercise: stairs at home 20 times a day, walking the property   Diet: not following diabetic diet but trying to do better      Safety   Seat belts: Yes    Guns: Yes  and secure   Safe in relationships: Yes       Social Determinants of Health   Financial Resource Strain: Low Risk  (02/05/2022)   Overall Financial Resource Strain (CARDIA)    Difficulty of Paying Living Expenses: Not  hard at all  Food Insecurity: No Food Insecurity (02/05/2022)   Hunger Vital Sign    Worried About Running Out of Food in the Last Year: Never true    [place] in the Last Year: Never true  Transportation Needs: No Transportation Needs (02/05/2022)   PRAPARE -  Hydrologist (Medical): No    Lack of Transportation (Non-Medical): No  Physical Activity: Insufficiently Active (02/05/2022)   Exercise Vital Sign    Days of Exercise per Week: 7 days    Minutes of Exercise per Session: 20 min  Stress: No Stress Concern Present (02/05/2022)   [place]    Feeling of Stress : Not at all  Social Connections: [place] (02/05/2022)   Social Connection and Isolation Panel [NHANES]    Frequency of Communication with Friends and Family: More than three times a week    Frequency of Social Gatherings with Friends and Family: More than three times a week    Attends Religious Services: More than 4 times per year    Active Member of [employer] or Organizations: Yes    Attends Music therapist: More than 4 times per year    Marital Status: Married  Human resources officer Violence: Not At Risk (02/05/2022)   Humiliation, Afraid, Rape, and Kick questionnaire    Fear of Current or Ex-Partner: No    Emotionally Abused: No    Physically Abused: No    Sexually Abused: No    Outpatient Medications Prior to Visit  Medication Sig Dispense Refill   albuterol (VENTOLIN HFA) 108 (90 Base) MCG/ACT inhaler Inhale 2 puffs into the lungs every 4 (four) hours as needed for wheezing or shortness of breath. 1 each 1   Azelastine HCl 137 MCG/SPRAY SOLN as needed.     clobetasol (TEMOVATE) 0.05 % external solution Apply 1 application topically 2 (two) times daily. 50 mL 2   diclofenac (FLECTOR) 1.3 % PTCH Place 1 patch onto the skin 2 (two) times daily. 60 patch 6   Dietary Management Product (NEOKE BHB) POWD Take 1 Dose  by mouth.     DULoxetine (CYMBALTA) 30 MG capsule Take 1 capsule (30 mg total) by mouth daily. 90 capsule 2   esomeprazole (NEXIUM) 40 MG capsule Take 1 capsule (40 mg total) by mouth 2 (two) times daily before a meal. 60 capsule 6   gabapentin (NEURONTIN) 600 MG tablet TAKE 2 TABLETS BY MOUTH IN THE MORNING AND AT NIGHT 360 tablet 2   glucose blood (FREESTYLE LITE) test strip Use as instructed to monitor FSBS 1x daily. Dx: E11.9 100 each 1   glucose monitoring kit (FREESTYLE) monitoring kit 1 each by Does not apply route as needed for other. Dispense one glucometer of choice, testing strips/lancets for once per day glucose checks.  QS for 1 month, 11 refills. 1 each 0   Lancets (FREESTYLE) lancets Use as instructed 100 each 12   lisinopril (ZESTRIL) 10 MG tablet Take 10 mg by mouth daily.     Semaglutide, 2 MG/DOSE, 8 MG/3ML SOPN Inject 2 mg as directed once a week. 3 mL 1   fluconazole (DIFLUCAN) 150 MG tablet Take 1 tablet (150 mg total) by mouth once a week. 2 tablet 0   lisinopril (ZESTRIL) 20 MG tablet Take 0.5 tablets (10 mg total) by mouth daily. 90 tablet 1   amoxicillin-clavulanate (AUGMENTIN) 875-125 MG tablet Take 1 tablet by mouth 2 (two) times daily. 20 tablet 0   COSENTYX SENSOREADY, 300 MG, 150 MG/ML SOAJ INJECT TWO PENS SUBCUTANEOUSLY EVERY 4 WEEKS. REFRIGERATE. ALLOW 15 TO 30 MINUTES AT ROOM TEMP PRIOR TO ADMINISTRATION. (Patient not taking: Reported on 11/08/2022) 2 mL 2   No facility-administered medications prior to visit.    Allergies  Allergen Reactions   Statins Other (See Comments)    Failed simvastatin, pravastatin, and rosuvastatin 5 mg weekly   Cinnamon     Dry throat       Review of Systems  Respiratory:  Negative for shortness of breath.   Cardiovascular:  Negative for chest pain and palpitations.  Gastrointestinal:  Negative for constipation and diarrhea.  Genitourinary:  Negative for dysuria, frequency and urgency.  Musculoskeletal:  Negative for  myalgias.  Psychiatric/Behavioral:  Negative for depression and suicidal ideas.   All other systems reviewed and are negative.    Objective:    Physical Exam Vitals reviewed. Nursing note reviewed: left inner auricle wit surrounding white discoloratoin of skin with black center. Constitutional:      General: She is not in acute distress.    Appearance: Normal appearance. She is obese. She is not ill-appearing or toxic-appearing.  HENT:     Right Ear: Tympanic membrane normal.     Left Ear: Tympanic membrane normal.     Mouth/Throat:     Mouth: Mucous membranes are moist.     Pharynx: No pharyngeal swelling.     Tonsils: No tonsillar exudate.  Eyes:     Extraocular Movements: Extraocular movements intact.     Conjunctiva/sclera: Conjunctivae normal.     Pupils: Pupils are equal, round, and reactive to light.  Neck:     Thyroid: No thyroid mass.  Cardiovascular:     Rate and Rhythm: Normal rate and regular rhythm.     Heart sounds: Murmur heard.  Pulmonary:     Effort: Pulmonary effort is normal.     Breath sounds: Normal breath sounds.  Abdominal:     General: Abdomen is flat. Bowel sounds are normal.     Palpations: Abdomen is soft.  Musculoskeletal:        General: Normal range of motion.  Lymphadenopathy:     Cervical:     Right cervical: No superficial cervical adenopathy.    Left cervical: No superficial cervical adenopathy.  Skin:    General: Skin is warm.     Capillary Refill: Capillary refill takes less than 2 seconds.     Comments: Small area inner ear auricle left side with surrounding hypopigmentation and central blackening   Neurological:     General: No focal deficit present.     Mental Status: She is alert and oriented to person, place, and time.  Psychiatric:        Mood and Affect: Mood normal.        Behavior: Behavior normal.        Thought Content: Thought content normal.        Judgment: Judgment normal.      BP 122/68   Pulse 67   Ht 5'  3.25" (1.607 m)   Wt 182 lb (82.6 kg)   SpO2 97%   BMI 31.99 kg/m  Wt Readings from Last 3 Encounters:  11/08/22 182 lb (82.6 kg)  09/12/22 186 lb (84.4 kg)  08/02/22 184 lb 4 oz (83.6 kg)     Health Maintenance Due  Topic Date Due   DTaP/Tdap/Td (1 - Tdap) Never done   Zoster Vaccines- Shingrix (1 of 2) Never done    There are no preventive care reminders to display for this patient.  Lab Results  Component Value Date   TSH 0.91 04/02/2017   Lab Results  Component Value Date   WBC 6.9 07/02/2021   HGB 13.8 07/02/2021   HCT 40.8 07/02/2021  MCV 86.8 07/02/2021   PLT 189 07/02/2021   Lab Results  Component Value Date   NA 141 11/08/2022   K 4.5 11/08/2022   CO2 31 11/08/2022   GLUCOSE 100 (H) 11/08/2022   BUN 15 11/08/2022   CREATININE 0.70 11/08/2022   BILITOT 0.6 11/08/2022   ALKPHOS 86 11/08/2022   AST 20 11/08/2022   ALT 19 11/08/2022   PROT 7.2 11/08/2022   ALBUMIN 4.4 11/08/2022   CALCIUM 10.0 11/08/2022   ANIONGAP 9 02/06/2017   EGFR 93 11/16/2021   GFR 85.81 11/08/2022   Lab Results  Component Value Date   CHOL 282 (H) 11/08/2022   Lab Results  Component Value Date   HDL 51.00 11/08/2022   Lab Results  Component Value Date   LDLCALC 168 (H) 07/02/2021   Lab Results  Component Value Date   TRIG 224.0 (H) 11/08/2022   Lab Results  Component Value Date   CHOLHDL 6 11/08/2022   Lab Results  Component Value Date   HGBA1C 6.0 (A) 11/08/2022      Assessment & Plan:   Problem List Items Addressed This Visit       Respiratory   Pulmonary nodule   COPD (chronic obstructive pulmonary disease) ([place])   Bronchiectasis without complication (HCC)    Possible sinusitis. Does have chest colds at times, did recommend pt f/u back up with pulmonary as well.      Recurrent pansinusitis    Referral to ENT for recurrent infection. Reviewed notes from pulmonary as suspected sinus blockage.  Did recommendation starting daily flonase 50 mcg  and continue with astelin spray.      Relevant Orders   Ambulatory referral to ENT   Pulmonary nodule less than 6 mm determined by computed tomography of lung   Relevant Orders   DG Chest 2 View (Completed)     Digestive   Hepatic steatosis     Endocrine   Type 2 diabetes mellitus with neurological complications ([place]) - Primary    .advised pt of following:  Continue semaglutide 2 mg once weekly. Continue to work on diabetic diet. Ordered hga1c today pending results. Work on diabetic diet and exercise as tolerated. Yearly foot exam, and annual eye exam.        Relevant Medications   lisinopril (ZESTRIL) 10 MG tablet   Other Relevant Orders   HgB A1c (Completed)   Diabetic neuropathy ([place])    Continue f/u with podiatrist as scheduled  Continue diclofenac and gabapentin as prescribed       Relevant Medications   lisinopril (ZESTRIL) 10 MG tablet   Other Relevant Orders   Comprehensive metabolic panel (Completed)   Adrenal adenoma, left    Seen on imaging, however time did not allow to d/w pt. Will d/w pt at next visit to obtain history.        Nervous and Auditory   Skin lesion of left ear    Suspected black head, although not resolving. Referral to dermatology for eval/treat. Could be abn skin lesion as well.       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 Plan to restart  cocentyx as worsening psoriasis.      Relevant Orders   Ambulatory referral to Dermatology     Other   Hyperlipidemia   Relevant Medications   lisinopril (ZESTRIL) 10 MG tablet   Evolocumab (REPATHA) 140 MG/ML SOSY   Other Relevant Orders   Lipid panel  (Completed)   Comprehensive metabolic panel (Completed)   Heart murmur   Relevant Orders   ECHOCARDIOGRAM COMPLETE   DOE (dyspnea on exertion)   Relevant Orders   DG Chest 2 View (Completed)   ECHOCARDIOGRAM COMPLETE   Statin intolerance   Relevant Medications   Evolocumab (REPATHA) 140 MG/ML SOSY   Other Visit Diagnoses     Risk of exposure to Lyme disease       Relevant Orders   B. burgdorfi antibodies by WB       Meds ordered this encounter  Medications   Evolocumab (REPATHA) 140 MG/ML SOSY    Sig: Inject 140 mg [place] q2weeks    Dispense:  2 mL    Refill:  5    Failure rosuvastatin, simvastatin, pravastatin    Order Specific Question:   Supervising Provider    Answer:   Diona Browner, AMY E [[id number]]    Follow-up: Return in about 1 month (around 12/09/2022) for f/u cholesterol.    Eugenia Pancoast, FNP

## 2022-11-08 NOTE — Progress Notes (Signed)
Improved

## 2022-11-08 NOTE — Assessment & Plan Note (Signed)
Continue f/u with podiatrist as scheduled  Continue diclofenac and gabapentin as prescribed

## 2022-11-08 NOTE — Patient Instructions (Addendum)
------------------------------------   I want you to continue with astelastine nose spray but start over the counter flonase (fluticasone)    ------------------------------------ A referral was placed today for ear nose and throat.  Please let us know if you have not heard back within 2 weeks about the referral.   ------------------------------------ Complete xray(s) prior to leaving today. I will notify you of your results once received.  ------------------------------------  Your imaging for ultrasound heart/echocardiogram Has been scheduled at the following location:  [facility].  Please call to schedule appointment.  ------------------------------------  Stop by the lab prior to leaving today. I will notify you of your results once received.   A referral was placed today again for urgent derm referral Please let us know if you have not heard back within 2 weeks about the referral.   Regards,   Regan Llorente FNP-C

## 2022-11-11 DIAGNOSIS — H6192 Disorder of left external ear, unspecified: Secondary | ICD-10-CM | POA: Insufficient documentation

## 2022-11-11 DIAGNOSIS — L409 Psoriasis, unspecified: Secondary | ICD-10-CM | POA: Insufficient documentation

## 2022-11-11 DIAGNOSIS — Z789 Other specified health status: Secondary | ICD-10-CM | POA: Insufficient documentation

## 2022-11-11 DIAGNOSIS — R0609 Other forms of dyspnea: Secondary | ICD-10-CM | POA: Insufficient documentation

## 2022-11-11 DIAGNOSIS — R011 Cardiac murmur, unspecified: Secondary | ICD-10-CM | POA: Insufficient documentation

## 2022-11-11 DIAGNOSIS — G620 Drug-induced polyneuropathy: Secondary | ICD-10-CM | POA: Insufficient documentation

## 2022-11-11 DIAGNOSIS — R911 Solitary pulmonary nodule: Secondary | ICD-10-CM | POA: Insufficient documentation

## 2022-11-11 LAB — B. BURGDORFI ANTIBODIES BY WB

## 2022-11-11 MED ORDER — REPATHA 140 MG/ML ~~LOC~~ SOSY: PREFILLED_SYRINGE | SUBCUTANEOUS | 5 refills | Status: DC

## 2022-11-11 NOTE — Assessment & Plan Note (Signed)
Referral to ENT for recurrent infection. Reviewed notes from pulmonary as suspected sinus blockage.  Did recommendation starting daily flonase 50 mcg and continue with astelin spray.

## 2022-11-11 NOTE — Assessment & Plan Note (Signed)
Possible sinusitis. Does have chest colds at times, did recommend pt f/u back up with pulmonary as well.

## 2022-11-11 NOTE — Assessment & Plan Note (Signed)
.  advised pt of following:  Continue semaglutide 2 mg once weekly. Continue to work on diabetic diet. Ordered hga1c today pending results. Work on diabetic diet and exercise as tolerated. Yearly foot exam, and annual eye exam.

## 2022-11-11 NOTE — Assessment & Plan Note (Signed)
Suspected black head, although not resolving. Referral to dermatology for eval/treat. Could be abn skin lesion as well.

## 2022-11-11 NOTE — Progress Notes (Signed)
Cholesterol much too high but we are working on starting repatha. Work on low cholesterol diet exercise as tolerated as well.   Labs otherwise ok.

## 2022-11-11 NOTE — Assessment & Plan Note (Signed)
Seen on imaging, however time did not allow to d/w pt. Will d/w pt at next visit to obtain history.

## 2022-11-11 NOTE — Progress Notes (Signed)
Chest xray negative for acute concerns.

## 2022-11-11 NOTE — Assessment & Plan Note (Signed)
Stat referral for dermatology Also recommended pt call insurance to see if she can find dermatology and call to see if she can get in sooner, and we will change referral. Plan to restart cocentyx as worsening psoriasis.

## 2022-11-12 ENCOUNTER — Other Ambulatory Visit: Payer: Self-pay | Admitting: Podiatry

## 2022-11-12 ENCOUNTER — Telehealth: Payer: Self-pay | Admitting: Family

## 2022-11-12 ENCOUNTER — Other Ambulatory Visit (HOSPITAL_COMMUNITY): Payer: Self-pay

## 2022-11-12 NOTE — Progress Notes (Signed)
Test is negative for lyme disease.

## 2022-11-12 NOTE — Telephone Encounter (Signed)
Pt called & stated she saw Dugal on Fri, 1/5 for [facility] & was referred her to [facility] for a ultrasound for her heart. Pt stated Forestine Na told her since the she was a new pt, they would need a referral faxed to them from [place] states the referral can be faxed to them @ [phone]. Call back # [phone]

## 2022-11-12 NOTE — Telephone Encounter (Signed)
Patient notified as instructed by telephone and verbalized understanding. Patient stated when she made the first call it was to imaging and that person told her that they saw the referral. Patient stated that she was then transferred to schedule the appointment. Patient stated the guy that she talked to told her he did not see an order. Patient was advised to call them back and if they have a problem scheduling the appointment they should call the office and let us know exactly what they need.

## 2022-11-12 NOTE — Telephone Encounter (Signed)
Abigail [name] from [place] called stating the meds that were prescribed yesterday for pt, Evolocumab (REPATHA) 140 MG/ML SOSY needed a prior authorization from [place] stated a prior authorization was faxed over earlier today & called to check status of it? Call back # [phone]

## 2022-11-13 ENCOUNTER — Other Ambulatory Visit (HOSPITAL_COMMUNITY): Payer: Self-pay

## 2022-11-13 NOTE — Telephone Encounter (Signed)
Patient Advocate Encounter   Received notification from [place] that prior authorization for Repatha [employer] '140MG'$ /ML is required.   PA submitted on 11/13/2022 Key [id number] Status is pending

## 2022-11-18 ENCOUNTER — Other Ambulatory Visit (HOSPITAL_COMMUNITY): Payer: Self-pay

## 2022-11-19 ENCOUNTER — Other Ambulatory Visit: Payer: Self-pay

## 2022-11-19 MED ORDER — FREESTYLE LITE TEST VI STRP: ORAL_STRIP | 1 refills | Status: AC

## 2022-12-03 NOTE — Telephone Encounter (Signed)
Patient called stating she still has not been able to get Repatha from [facility]. Viewed PA submitted 1/9, it was expired. Renewed PA and re-submitted (Key: [id number]) and it has been approved.

## 2022-12-03 NOTE — Telephone Encounter (Cosign Needed)
Patient notified and will let us know if cost is too high.  Avel Sensor, [place] Assistant [phone]

## 2022-12-04 DIAGNOSIS — L570 Actinic keratosis: Secondary | ICD-10-CM | POA: Diagnosis not present

## 2022-12-04 DIAGNOSIS — L814 Other melanin hyperpigmentation: Secondary | ICD-10-CM | POA: Diagnosis not present

## 2022-12-04 DIAGNOSIS — L720 Epidermal cyst: Secondary | ICD-10-CM | POA: Diagnosis not present

## 2022-12-04 DIAGNOSIS — L821 Other seborrheic keratosis: Secondary | ICD-10-CM | POA: Diagnosis not present

## 2022-12-04 DIAGNOSIS — L400 Psoriasis vulgaris: Secondary | ICD-10-CM | POA: Diagnosis not present

## 2022-12-04 DIAGNOSIS — Z79899 Other long term (current) drug therapy: Secondary | ICD-10-CM | POA: Diagnosis not present

## 2022-12-04 DIAGNOSIS — D485 Neoplasm of uncertain behavior of skin: Secondary | ICD-10-CM | POA: Diagnosis not present

## 2022-12-09 ENCOUNTER — Ambulatory Visit (HOSPITAL_COMMUNITY)
Admission: RE | Admit: 2022-12-09 | Discharge: 2022-12-09 | Disposition: A | Discharge status: Home / Self Care | Payer: Medicare Other | Source: Ambulatory Visit | Attending: Family | Admitting: Family

## 2022-12-09 ENCOUNTER — Other Ambulatory Visit: Payer: Self-pay | Admitting: Family

## 2022-12-09 DIAGNOSIS — I10 Essential (primary) hypertension: Secondary | ICD-10-CM

## 2022-12-09 DIAGNOSIS — R931 Abnormal findings on diagnostic imaging of heart and coronary circulation: Secondary | ICD-10-CM

## 2022-12-09 DIAGNOSIS — R0609 Other forms of dyspnea: Secondary | ICD-10-CM | POA: Insufficient documentation

## 2022-12-09 DIAGNOSIS — I517 Cardiomegaly: Secondary | ICD-10-CM

## 2022-12-09 DIAGNOSIS — R011 Cardiac murmur, unspecified: Secondary | ICD-10-CM

## 2022-12-09 LAB — ECHOCARDIOGRAM COMPLETE
AR max vel: 1.6 cm2
AV Area VTI: 0.73 cm2
AV Area mean vel: 0.75 cm2
AV Mean grad: 51 mmHg
AV Peak grad: 88.6 mmHg
Ao pk vel: 4.71 m/s
Area-P 1/2: 1.06 cm2
MV VTI: 1.83 cm2
S' Lateral: 2 cm

## 2022-12-09 NOTE — Progress Notes (Signed)
Ask if pt has cardiologist.  If no, I am going to refer her to cardiology based off of her recent echocardiogram. With murmur heard on exam echocardiogram was ordered. This echo showed an abnormal mitral valve, with severe calcifications/plaquing likely from long standing cholesterol. There also is severe left ventricular hypertrophy which is often seen when someone has had high blood pressure going on for some time.   Seen was asymmetric basal septal hypertrophy which is thickening of the muscular wall of the intraventricular aspect, typically from hypertension.   At this time, we will continue to make sure blood pressure is adequately controlled as well as cholesterol, and have pt evaluated further by cardiology per referral I am going to place today.

## 2022-12-09 NOTE — Progress Notes (Signed)
*  PRELIMINARY RESULTS* Echocardiogram 2D Echocardiogram has been performed.  Abigail [name] 12/09/2022, 11:37 AM

## 2022-12-10 ENCOUNTER — Telehealth: Payer: Self-pay

## 2022-12-10 NOTE — Progress Notes (Signed)
Care Management & Coordination Services Pharmacy Team  Reason for Encounter: Appointment Reminder  Contacted patient to confirm telephone appointment with [name], PharmD on 12/13/22 at 10:15. Spoke with patient on 12/10/2022   Do you have any problems getting your medications?  Repatha is waiting on her at [facility] is your top health concern you would like to discuss at your upcoming visit? No concerns at this time   Have you seen any other providers since your last visit with PCP? No   Hospital visits:  None in previous 6 months   Star Rating Drugs:  Medication:  Last Fill: Day Supply Lisinopril '10mg'$   Uses 1/2 tablet of '20mg'$  Ozempic  11/05/22  86  Patient reports she has supply on hand of lisinopril Care Gaps: Annual wellness visit in last year? Yes  If Diabetic: Last eye exam / retinopathy screening: UTD Last diabetic foot exam:UTD   [name], PharmD notified  Abigail [name], [place] Assistant [phone]

## 2022-12-10 NOTE — Progress Notes (Signed)
noted 

## 2022-12-13 ENCOUNTER — Ambulatory Visit: Payer: Medicare Other | Admitting: Pharmacist

## 2022-12-13 ENCOUNTER — Telehealth: Payer: Self-pay | Admitting: Pharmacist

## 2022-12-13 DIAGNOSIS — Z789 Other specified health status: Secondary | ICD-10-CM

## 2022-12-13 DIAGNOSIS — E782 Mixed hyperlipidemia: Secondary | ICD-10-CM

## 2022-12-13 DIAGNOSIS — G620 Drug-induced polyneuropathy: Secondary | ICD-10-CM

## 2022-12-13 MED ORDER — REPATHA SURECLICK 140 MG/ML ~~LOC~~ SOAJ: SUBCUTANEOUS | 5 refills | Status: DC

## 2022-12-13 NOTE — Telephone Encounter (Signed)
Patient picked up [place] and it is the version in the pre-filled syringe with a large visible needle: .  She is afraid to inject this herself and would prefer the auto-injector version:   Repatha Engineer, building services.    Routing to PCP for Rx.

## 2022-12-13 NOTE — Telephone Encounter (Signed)
No problem thanks for the heads up. Refilled for sureclick.  Could we please call and let pt know this has been sent in? [facility] pool)

## 2022-12-13 NOTE — Progress Notes (Signed)
Care Management & Coordination Services Pharmacy Note  12/13/2022 Name:  Abigail [name] MRN:  [id number] DOB:  Jul 16, 1951  Summary: F/U visit -HLD/ASCVD risk: LDL 204 off of statins; she was prescribed Repatha last month, but she picked up the pre-filled syringe and is afraid to inject this herself, she would prefer the auto-injector version -DM: A1c 6.0% (11/2022), she is doing well on Ozempic 2 mg weekly, she reports Fasting BG 100-115, she has not had any weight loss -HTN: BP at goal in recent OV; she affirms compliance with lisinopril which she gets through the [place] with her husband   Recommendations/Changes made from today's visit: -Recommend to switch to [place] auto-injector; advised pt to set up office visit with me for device training  Follow up plan: -Pharmacist follow up office visit for [place] training TBD -Cardiology appt 01/01/23 (new pt)    Subjective: Abigail [name] is an 72 y.o. year old female who is a primary patient of Eugenia Pancoast, [place].  The care coordination team was consulted for assistance with disease management and care coordination needs.    Engaged with patient by telephone for follow up visit.  Recent office visits: 11/08/22 NP Tabitha ugal OV: TOC - A1c 6.0%; referred to ENT for recurrent sinusitis. LDL 204, start Repatha.   11/01/22 TE - pt stopped pravastatin d/t SE.  09/12/22 Dr Damita Dunnings OV: psoriasis, acute sinusitis. Rx Augmentin, fluconazole. Refer to dermatology  Recent consult visits: 12/09/22 ECHO - abnormal mitral valve w/ calcifications. EF 65-70% with LVH. Referred to cardiology.  Hospital visits: None in previous 6 months   Objective:  Lab Results  Component Value Date   CREATININE 0.70 11/08/2022   BUN 15 11/08/2022   GFR 85.81 11/08/2022   EGFR 93 11/16/2021   GFRNONAA 66 07/09/2019   GFRAA 76 07/09/2019   NA 141 11/08/2022   K 4.5 11/08/2022   CALCIUM 10.0 11/08/2022   CO2 31 11/08/2022   GLUCOSE 100 (H) 11/08/2022     Lab Results  Component Value Date/Time   HGBA1C 6.0 (A) 11/08/2022 11:49 AM   HGBA1C 6.3 (A) 08/02/2022 09:01 AM   HGBA1C 7.9 (H) 11/16/2021 10:30 AM   HGBA1C 6.0 (H) 07/02/2021 12:24 PM   GFR 85.81 11/08/2022 12:33 PM   GFR 89.02 05/01/2022 12:13 PM   MICROALBUR 1.0 08/02/2022 09:35 AM   MICROALBUR 0.3 11/16/2021 10:30 AM    Last diabetic Eye exam:  Lab Results  Component Value Date/Time   HMDIABEYEEXA No Retinopathy 07/17/2022 12:00 AM    Last diabetic Foot exam: No results found for: "HMDIABFOOTEX"   Lab Results  Component Value Date   CHOL 282 (H) 11/08/2022   HDL 51.00 11/08/2022   LDLCALC 168 (H) 07/02/2021   LDLDIRECT 204.0 11/08/2022   TRIG 224.0 (H) 11/08/2022   CHOLHDL 6 11/08/2022       Latest Ref Rng & Units 11/08/2022   12:33 PM 05/01/2022   12:13 PM 07/02/2021   12:24 PM  Hepatic Function  Total Protein 6.0 - 8.3 g/dL 7.2  6.2  6.5   Albumin 3.5 - 5.2 g/dL 4.4  4.0    AST 0 - 37 U/L 20  17  21   $ ALT 0 - 35 U/L 19  15  19   $ Alk Phosphatase 39 - 117 U/L 86  71    Total Bilirubin 0.2 - 1.2 mg/dL 0.6  0.6  0.6     Lab Results  Component Value Date/Time   TSH 0.91 04/02/2017 10:12  AM   TSH 0.702 05/12/2014 09:23 AM       Latest Ref Rng & Units 07/02/2021   12:24 PM 12/01/2020   11:19 AM 09/23/2018    9:25 AM  CBC  WBC 3.8 - 10.8 Thousand/uL 6.9  6.9  7.8   Hemoglobin 11.7 - 15.5 g/dL 13.8  14.2  14.0   Hematocrit 35.0 - 45.0 % 40.8  41.7  40.8   Platelets 140 - 400 Thousand/uL 189  229  219     Lab Results  Component Value Date/Time   VD25OH 25 (L) 06/13/2017 02:40 PM    Clinical ASCVD: No  The 10-year ASCVD risk score (Arnett DK, et al., 2019) is: 29.8%   Values used to calculate the score:     Age: 72 years     Sex: Female     Is Non-Hispanic African American: No     Diabetic: Yes     Tobacco smoker: No     Systolic Blood Pressure: 123XX123 mmHg     Is BP treated: Yes     HDL Cholesterol: 51 mg/dL     Total Cholesterol: 282 mg/dL         02/05/2022    8:23 AM 01/24/2022   11:27 AM 12/01/2020   10:45 AM  Depression screen PHQ 2/9  Decreased Interest 0 0 0  Down, Depressed, Hopeless 0 0 0  PHQ - 2 Score 0 0 0     Social History   Tobacco Use  Smoking Status Never  Smokeless Tobacco Never   BP Readings from Last 3 Encounters:  11/08/22 122/68  09/12/22 110/62  08/02/22 130/70   Pulse Readings from Last 3 Encounters:  11/08/22 67  09/12/22 72  08/02/22 75   Wt Readings from Last 3 Encounters:  11/08/22 182 lb (82.6 kg)  09/12/22 186 lb (84.4 kg)  08/02/22 184 lb 4 oz (83.6 kg)   BMI Readings from Last 3 Encounters:  11/08/22 31.99 kg/m  09/12/22 32.69 kg/m  08/02/22 32.38 kg/m    Allergies  Allergen Reactions   Statins Other (See Comments)    Failed simvastatin, pravastatin, and rosuvastatin 5 mg weekly   Cinnamon     Dry throat     Medications Reviewed Today     Reviewed by Charlton Haws, RPH (Pharmacist) on 12/13/22 at 52  Med List Status: <None>   Medication Order Taking? Sig Documenting Provider Last Dose Status Informant  albuterol (VENTOLIN HFA) 108 (90 Base) MCG/ACT inhaler [id number] Yes Inhale 2 puffs into the lungs every 4 (four) hours as needed for wheezing or shortness of breath. Eulogio Bear, NP Taking Active   Azelastine HCl 137 MCG/SPRAY SOLN [id number] Yes as needed. [provider] Taking Active   clobetasol (TEMOVATE) 0.05 % external solution [id number] Yes Apply 1 application topically 2 (two) times daily. [place], [place]ena Nunnery, MD Taking Active   diclofenac (FLECTOR) 1.3 % [facility] [id number] Yes Place 1 patch onto the skin 2 (two) times daily. Tyson Dense T, DPM Taking Active   Dietary Management Product (NEOKE BHB) POWD [id number] Yes Take 1 Dose by mouth. [provider] Taking Active   DULoxetine (CYMBALTA) 30 MG capsule [id number] Yes TAKE 1 CAPSULE(30 MG) BY MOUTH DAILY Hyatt, Max T, DPM Taking Active   esomeprazole (NEXIUM) 40 MG capsule  [id number] Yes Take 1 capsule (40 mg total) by mouth 2 (two) times daily before a meal. Mauri Pole, MD Taking Active   Evolocumab ([place]) [place] [id number]  No Inject 140 mg [place] q2weeks  Patient not taking: Reported on 12/13/2022   Eugenia Pancoast, FNP Not Taking Active   gabapentin (NEURONTIN) 600 MG tablet [id number] Yes TAKE 2 TABLETS BY MOUTH IN THE [facility] AND AT [facility], Max T, DPM Taking Active   glucose blood (FREESTYLE LITE) test strip [id number] Yes Use as instructed to monitor FSBS 1x daily. Dx: E11.9 Eugenia Pancoast, FNP Taking Active   glucose monitoring kit (FREESTYLE) monitoring kit [id number] Yes 1 each by Does not apply route as needed for other. Dispense one glucometer of choice, testing strips/lancets for once per day glucose checks.  QS for 1 month, 11 refills. Wendie Agreste, MD Taking Active Self  Lancets (FREESTYLE) lancets [id number] Yes Use as instructed Alycia Rossetti, MD Taking Active   lisinopril (ZESTRIL) 10 MG tablet [id number] Yes Take 10 mg by mouth daily. [provider] Taking Active   Semaglutide, 2 MG/DOSE, 8 MG/3ML SOPN [id number] Yes Inject 2 mg as directed once a week. Waunita Schooner, MD Taking Active             SDOH:  (Social Determinants of Health) assessments and interventions performed: No SDOH Interventions    Flowsheet Row Clinical Support from 02/05/2022 in [place] at [place] Interventions Intervention Not Indicated  Housing Interventions Intervention Not Indicated  Transportation Interventions Intervention Not Indicated  Financial Strain Interventions Intervention Not Indicated  Physical Activity Interventions Intervention Not Indicated, Patient Refused  Stress Interventions Intervention Not Indicated  Social Connections Interventions Intervention Not Indicated       Medication Assistance: None required.  Patient affirms current coverage meets  needs.  Medication Access: Within the past 30 days, how often has patient missed a dose of medication? 0 Is a pillbox or other method used to improve adherence? Yes  Factors that may affect medication adherence? no barriers identified Are meds synced by current pharmacy? No  Are meds delivered by current pharmacy? Yes  Does patient experience delays in picking up medications due to transportation concerns? No   [facility] Services Reviewed: Is patient disadvantaged to use [facility]?: Yes  Current Rx insurance plan: [place] Name and location of Current pharmacy:  [facility] [place] [place] - [address] [place] [address] Phone: [phone] Fax: [phone]  [facility] [place] [place] [facility] [address] [place] [address] Phone: [phone] Fax: [phone]  [facility] services reviewed with patient today?: No  Patient requests to transfer care to [facility] Pharmacy?: No  Reason patient declined to change pharmacies: Disadvantaged due to insurance/mail order  Compliance/Adherence/Medication fill history: Care Gaps: None  Star-Rating Drugs: Lisinopril - PDC 0% - LF 11/16/21 x 90 ds Ozempic - PDC 82%    ASSESSMENT / PLAN   Hypertension (BP goal <130/80) -Controlled- BP at goal in recent OV -Pt states she gets lisinopril through the [place] w/ her husband -Current home readings: "good" -Current treatment: Lisinopril 20 mg - 1/2 tab daily - Appropriate, Effective, Safe, Accessible -Medications previously tried: n/a  -Educated on BP goals and benefits of medications for prevention of heart attack, stroke and kidney damage; Importance of home blood pressure monitoring; -Counseled to monitor BP at home daily -Recommended to continue current medication  Hyperlipidemia: (LDL goal <  70) -Uncontrolled- LDL 204 (11/2022) very  high; she has not tolerated multiple statins; she was prescribed Repatha last month, but she picked up the pre-filled syringe and is afraid to inject this herself, she would prefer the auto-injector version -ASCVD risk 29% is very high; hx statin intolerance -Current treatment: Repatha 140 mg q14 days- not started -Medications previously tried: ezetimibe, fenofibrate, rosuvastatin 5 mg weekly, simvastatin 20 mg, pravastatin -Educated on Cholesterol goals; Benefits of Repatha for LDL lowering and ASCVD risk reduction; -Recommend to switch to [employer] auto-injector; advised pt to set up office visit with me for device training  Diabetes (A1c goal <7%) -Controlled - A1c 6.0% (11/2022) at goal; pt is doing well with Ozempic, though has not had any significant weight loss -Current home glucose readings: Fasting: 114, 111, 71, 101, 121, 118, 94, 86, 134, 109 -Denies hypoglycemic/hyperglycemic symptoms -Current medications: Ozempic 2 mg weekly - Appropriate, Effective, Safe, Accessible Testing supplies -Medications previously tried: Trulicity, Jardiance (yeast infxn), glipizide, Victoza, metformin (GI), Ozempic 1 mg (GI) -Current meal patterns: increased fruit and vegetables, water -Current exercise: walking -Educated on A1c and blood sugar goals;Complications of diabetes including kidney damage, retinal damage, and cardiovascular disease; -Assessed pt finances - she is able to pay for Ozempic currently ($47/month); discussed PAP is available if needed -Recommended to continue current medication  Neuropathy (Goal: manage symptoms) -Controlled - per pt report -Follows with DPM Hyatt -Current treatment  Duloxetine 30 mg daily - Appropriate, Effective, Safe, Accessible Gabapentin 600 mg - 2 cap BID - Appropriate, Effective, Safe, Accessible -Medications previously tried: n/a  -Recommended to continue current medication  GERD (Goal: manage  symptoms) -Controlled - per pt report -Hx esophageal stretching -Current treatment  Esomeprazole 40 mg BID - Appropriate, Effective, Safe, Accessible -Medications previously tried: n/a  -Recommended to continue current medication  Health Maintenance -Vaccine gaps: Shingrix, TDAP    [name], PharmD, BCACP Clinical Pharmacist [facility] at [facility] [phone]

## 2022-12-13 NOTE — Patient Instructions (Signed)
Visit Information  Phone number for Pharmacist: [phone]  Thank you for meeting with me to discuss your medications! Below is a summary of what we talked about during the visit:   Recommendations/Changes made from today's visit: -Recommend to switch to [place] auto-injector; advised pt to set up office visit with me for device training  Follow up plan: -Pharmacist follow up office visit for [place] training TBD -Cardiology appt 01/01/23 (new pt)   [name], PharmD, BCACP Clinical Pharmacist [facility] at [facility] [phone]

## 2022-12-16 NOTE — Telephone Encounter (Signed)
Called and notified patient.

## 2022-12-20 ENCOUNTER — Telehealth: Payer: Self-pay | Admitting: *Deleted

## 2022-12-20 DIAGNOSIS — T162XXA Foreign body in left ear, initial encounter: Secondary | ICD-10-CM | POA: Diagnosis not present

## 2022-12-20 DIAGNOSIS — J320 Chronic maxillary sinusitis: Secondary | ICD-10-CM | POA: Diagnosis not present

## 2022-12-20 DIAGNOSIS — J329 Chronic sinusitis, unspecified: Secondary | ICD-10-CM | POA: Diagnosis not present

## 2022-12-20 DIAGNOSIS — H6123 Impacted cerumen, bilateral: Secondary | ICD-10-CM | POA: Diagnosis not present

## 2022-12-20 NOTE — Telephone Encounter (Signed)
Patient is calling because the right foot were she had last injection is staying purple and a little redness,no fever, has had several injections previously and not like this one, has an upcoming appointment in March, please advise.

## 2022-12-26 ENCOUNTER — Telehealth: Payer: Self-pay | Admitting: *Deleted

## 2022-12-26 NOTE — Telephone Encounter (Signed)
Returned the call back to patient, no answer, left voice message for call back.

## 2022-12-26 NOTE — Telephone Encounter (Signed)
Called patient ,no answer, left voice message if no improvements with her right foot.

## 2023-01-01 ENCOUNTER — Other Ambulatory Visit (HOSPITAL_COMMUNITY)
Admission: RE | Admit: 2023-01-01 | Discharge: 2023-01-01 | Disposition: A | Discharge status: Home / Self Care | Payer: Medicare Other | Source: Ambulatory Visit | Attending: Internal Medicine | Admitting: Internal Medicine

## 2023-01-01 ENCOUNTER — Encounter: Payer: Self-pay | Admitting: Internal Medicine

## 2023-01-01 ENCOUNTER — Ambulatory Visit (INDEPENDENT_AMBULATORY_CARE_PROVIDER_SITE_OTHER): Payer: Medicare Other | Admitting: Internal Medicine

## 2023-01-01 VITALS — BP 136/74 | HR 70 | Ht 62.0 in | Wt 183.0 lb

## 2023-01-01 DIAGNOSIS — Z01818 Encounter for other preprocedural examination: Secondary | ICD-10-CM | POA: Diagnosis not present

## 2023-01-01 DIAGNOSIS — I342 Nonrheumatic mitral (valve) stenosis: Secondary | ICD-10-CM | POA: Diagnosis not present

## 2023-01-01 DIAGNOSIS — I050 Rheumatic mitral stenosis: Secondary | ICD-10-CM | POA: Insufficient documentation

## 2023-01-01 DIAGNOSIS — I422 Other hypertrophic cardiomyopathy: Secondary | ICD-10-CM | POA: Diagnosis not present

## 2023-01-01 DIAGNOSIS — I350 Nonrheumatic aortic (valve) stenosis: Secondary | ICD-10-CM | POA: Diagnosis not present

## 2023-01-01 DIAGNOSIS — I358 Other nonrheumatic aortic valve disorders: Secondary | ICD-10-CM

## 2023-01-01 LAB — BASIC METABOLIC PANEL
Anion gap: 6 (ref 5–15)
BUN: 15 mg/dL (ref 8–23)
CO2: 25 mmol/L (ref 22–32)
Calcium: 8.7 mg/dL — ABNORMAL LOW (ref 8.9–10.3)
Chloride: 106 mmol/L (ref 98–111)
Creatinine, Ser: 0.66 mg/dL (ref 0.44–1.00)
GFR, Estimated: >60 mL/min (ref >60)
Glucose, Bld: 92 mg/dL (ref 70–99)
Potassium: 4.1 mmol/L (ref 3.5–5.1)
Sodium: 137 mmol/L (ref 135–145)

## 2023-01-01 LAB — CBC
HCT: 42.8 % (ref 36.0–46.0)
Hemoglobin: 14.6 g/dL (ref 12.0–15.0)
MCH: 29.7 pg (ref 26.0–34.0)
MCHC: 34.1 g/dL (ref 30.0–36.0)
MCV: 87 fL (ref 80.0–100.0)
Platelets: 189 10*3/uL (ref 150–400)
RBC: 4.92 MIL/uL (ref 3.87–5.11)
RDW: 12.6 % (ref 11.5–15.5)
WBC: 7.6 10*3/uL (ref 4.0–10.5)
nRBC: 0 % (ref 0.0–0.2)

## 2023-01-01 MED ORDER — FUROSEMIDE 20 MG PO TABS: 20.0000 mg | ORAL_TABLET | Freq: Every day | ORAL | 3 refills | Status: DC | PRN

## 2023-01-01 MED ORDER — METOPROLOL SUCCINATE ER 25 MG PO TB24: 25.0000 mg | ORAL_TABLET | Freq: Every day | ORAL | 3 refills | Status: DC

## 2023-01-01 NOTE — Progress Notes (Addendum)
Cardiology Office Note  Date: 01/01/2023   ID: [name], [name] [birth year], MRN [id number]  PCP:  Eugenia Pancoast, Abigail [name]  Cardiologist:  None Electrophysiologist:  None   Reason for Office Visit: Evaluation of severe aortic valve stenosis at the request of Dugal, FNP   History of Present Illness: Abigail [name] is a 72 y.o. female known to have HTN, DM 2, COPD was referred to cardiology clinic for evaluation of abnormal echocardiogram, severe aortic valve stenosis.  Patient underwent echocardiogram on 12/09/2022 that showed LVEF 65 to 70%, severe asymmetric basal septal hypertrophy measuring 1.9 cm, mild SAM of the anterior MV leaflet, dynamic subvalvular gradient, peak gradient at least 25 mmHg, G1 DD, normal RV systolic function, severely dilated LA, mild mitral valve stenosis, severe stenosis of aortic valve with mean gradient 51 mmHg, peak gradient 88.6 mmHg, valve area 0.73 cm (aortic valve area by VTI was 1.04 cm and mean gradient 24 mmHg on 04/2021 study). She has symptoms of DOE for quite a while and denied any orthopnea/PND. Denies any angina, dizziness, lightheadedness, syncope, leg swelling. Denies smoking cigarettes. No family history of PCI/CABG/CHF/HCM. She also said she does not know much history on her father side.  Past Medical History:  Diagnosis Date   Allergy    Anxiety    Arthritis    hands, knees   Cancer ([place])    Cataract    surgery to remove -bilateral   COPD (chronic obstructive pulmonary disease) ([place])    Depression    Diabetes mellitus ([place])    a. A1c 6.6 in 05/2013 indicating new dx.   Fatty liver    Female stress incontinence    GERD (gastroesophageal reflux disease)    Heart murmur    no problems   Hemorrhoids    History of kidney stones    surgery to remove   Hyperlipidemia    Hyperplastic colon polyp 06/07/2008   Hypertension    Hypertriglyceridemia    IBS (irritable bowel syndrome)    Lung nodule    a. 51m left lung nodule by CT 05/2013.    Neuromuscular disorder (HCC)    neuropathy feet   Obesity    Psoriasis    SCCA (squamous cell carcinoma) of skin 01/16/2022   Left Malar Cheek (in situ) (tx p bx)   Skin cancer    Squamous cell carcinoma of skin 01/16/2022   Right Breast (in situ) (curet and 5FU)    Past Surgical History:  Procedure Laterality Date   CATARACT EXTRACTION W/PHACO Left 10/06/2014   Procedure: CATARACT EXTRACTION PHACO AND INTRAOCULAR LENS PLACEMENT LEFT EYE;  Surgeon: KTonny Branch MD;  [facility]: [facility];  Service: Ophthalmology;  Laterality: Left;  CDE 7.35   CATARACT EXTRACTION W/PHACO Right 11/14/2014   Procedure: CATARACT EXTRACTION PHACO AND INTRAOCULAR LENS PLACEMENT RIGHT EYE;  Surgeon: KTonny Branch MD;  Location: [facility];  Service: Ophthalmology;  Laterality: Right;  CDE:6.39   COLONOSCOPY  06/2008   hx polyps   CYSTOSCOPY W/ RETROGRADES Right 01/21/2017   Procedure: CYSTOSCOPY WITH RETROGRADE PYELOGRAM;  Surgeon: AHollice Espy MD;  Location: [facility];  Service: Urology;  Laterality: Right;   CYSTOSCOPY/URETEROSCOPY/HOLMIUM LASER/STENT PLACEMENT Left 01/21/2017   Procedure: CYSTOSCOPY/URETEROSCOPY/HOLMIUM LASER/STENT PLACEMENT;  Surgeon: AHollice Espy MD;  Location: [facility];  Service: Urology;  Laterality: Left;   DORSAL COMPARTMENT RELEASE Left 06/15/2015   Procedure: LEFT WRIST DEQUERVAINS;  Surgeon: DNinetta Lights MD;  [facility]  Service: Orthopedics;  Laterality: Left;  HERNIA REPAIR     MOUTH SURGERY     tooth ext   NASAL SINUS SURGERY     SHOULDER SURGERY     right   TONSILLECTOMY     UPPER GI ENDOSCOPY     normal per patient   VENTRAL HERNIA REPAIR      Current Outpatient Medications  Medication Sig Dispense Refill   albuterol (VENTOLIN HFA) 108 (90 Base) MCG/ACT inhaler Inhale 2 puffs into the lungs every 4 (four) hours as needed for wheezing or shortness of breath. 1 each 1   Azelastine HCl 137 MCG/SPRAY SOLN as needed.     clobetasol (TEMOVATE) 0.05 %  external solution Apply 1 application topically 2 (two) times daily. 50 mL 2   diclofenac (FLECTOR) 1.3 % PTCH Place 1 patch onto the skin 2 (two) times daily. 60 patch 6   Dietary Management Product (NEOKE BHB) POWD Take 1 Dose by mouth.     DULoxetine (CYMBALTA) 30 MG capsule TAKE 1 CAPSULE(30 MG) BY MOUTH DAILY 90 capsule 2   esomeprazole (NEXIUM) 40 MG capsule Take 1 capsule (40 mg total) by mouth 2 (two) times daily before a meal. 60 capsule 6   Evolocumab (REPATHA SURECLICK) XX123456 MG/ML SOAJ Inject 140 mg [place] every two weeks 2 mL 5   furosemide (LASIX) 20 MG tablet Take 1 tablet (20 mg total) by mouth daily as needed. 30 tablet 3   gabapentin (NEURONTIN) 600 MG tablet TAKE 2 TABLETS BY MOUTH IN THE MORNING AND AT NIGHT 360 tablet 2   glucose blood (FREESTYLE LITE) test strip Use as instructed to monitor FSBS 1x daily. Dx: E11.9 100 each 1   glucose monitoring kit (FREESTYLE) monitoring kit 1 each by Does not apply route as needed for other. Dispense one glucometer of choice, testing strips/lancets for once per day glucose checks.  QS for 1 month, 11 refills. 1 each 0   Lancets (FREESTYLE) lancets Use as instructed 100 each 12   lisinopril (ZESTRIL) 10 MG tablet Take 10 mg by mouth daily.     metoprolol succinate (TOPROL XL) 25 MG 24 hr tablet Take 1 tablet (25 mg total) by mouth daily. 90 tablet 3   Semaglutide, 2 MG/DOSE, 8 MG/3ML SOPN Inject 2 mg as directed once a week. 3 mL 1   No current facility-administered medications for this visit.   Allergies:  Statins and Cinnamon   Social History: The patient  reports that she has never smoked. She has never used smokeless tobacco. She reports that she does not drink alcohol and does not use drugs.   Family History: The patient's family history includes Breast cancer in her mother; Colon polyps in her maternal aunt; Heart disease in her maternal grandfather.   ROS:  Please see the history of present illness. Otherwise, complete review of  systems is positive for none.  All other systems are reviewed and negative.   Physical Exam: VS:  BP 136/74   Pulse 70   Ht '5\' 2"'$  (1.575 m)   Wt 183 lb (83 kg)   SpO2 93%   BMI 33.47 kg/m , BMI Body mass index is 33.47 kg/m.  Wt Readings from Last 3 Encounters:  01/01/23 183 lb (83 kg)  11/08/22 182 lb (82.6 kg)  09/12/22 186 lb (84.4 kg)    General: Patient appears comfortable at rest. HEENT: Conjunctiva and lids normal, oropharynx clear with moist mucosa. Neck: Supple, no elevated JVP or carotid bruits, no thyromegaly. Lungs: Clear to auscultation, nonlabored  breathing at rest. Cardiac: Regular rate and rhythm, no S3 or significant systolic murmur, no pericardial rub. Abdomen: Soft, nontender, no hepatomegaly, bowel sounds present, no guarding or rebound. Extremities: No pitting edema, distal pulses 2+. Skin: Warm and dry. Musculoskeletal: No kyphosis. Neuropsychiatric: Alert and oriented x3, affect grossly appropriate.  ECG:  An ECG dated 01/01/2023 was personally reviewed today and demonstrated:  Normal sinus rhythm, Q waves in inferior leads and T wave inversions in the lateral leads (1 and aVL).  Recent Labwork: 11/08/2022: ALT 19; AST 20; BUN 15; Creatinine, Ser 0.70; Potassium 4.5; Sodium 141     Component Value Date/Time   CHOL 282 (H) 11/08/2022 1233   TRIG 224.0 (H) 11/08/2022 1233   HDL 51.00 11/08/2022 1233   CHOLHDL 6 11/08/2022 1233   VLDL 44.8 (H) 11/08/2022 1233   LDLCALC 168 (H) 07/02/2021 1224   LDLDIRECT 204.0 11/08/2022 1233    Other Studies Reviewed Today: Echocardiogram on 12/09/2022 LVEF 65 to 70%, severe asymmetric basal septal hypertrophy measuring 1.9 cm, mild SAM of the anterior MV leaflet, dynamic subvalvular gradient, peak gradient at least 25 mmHg, G1 DD, normal RV systolic function, severely dilated LA, mild mitral valve stenosis, severe stenosis of aortic valve with mean gradient 51 mmHg, peak gradient 88.6 mmHg, valve area 0.73 cm (aortic  valve area by VTI was 1.04 cm and mean gradient 24 mmHg on 04/2021 study).  Assessment and Plan: Patient is a 73 year old F known to have valvular heart disease (severe aortic valve stenosis, mild mitral valve stenosis per 12/2022 echocardiogram), HTN, DM 2, COPD was referred to cardiology clinic for evaluation of valvular heart disease.  # Severe aortic valve stenosis, symptomatic -Echocardiogram from 12/2022 showed severe aortic valve stenosis with mean gradient 51 mmHg, peak gradient 88.6 mmHg, V-max 4.7 cm/s and valve area 0.73 cm. Patient has been symptomatic with DOE for quite a while. Denies angina, dizziness/lightheadedness and syncope. She will benefit from [facility] and RHC for further evaluation of severe aortic valve stenosis. Risks and benefits of cardiac catheterization (LHC and RHC) have been discussed with the patient.  These include bleeding, infection, kidney damage, stroke, heart attack, death.  The patient understands these risks and is willing to proceed. Patient is scheduled to undergo LHC/RHC on 01/08/2023 with Dr. Burt Knack at 10 AM. -Referral to structural heart clinic  # Hypertrophic cardiomyopathy -Echocardiogram from 12/2022 showed severe asymmetric basal septal hypertrophy measuring 1.9 cm, mild SAM of the anterior MV leaflet and dynamic subvalvular gradient, peak gradient at least 25 mmHg. Obtain cardiac MRI for further evaluation. -Start metoprolol succinate 25 mg daily (if SOB symptoms do not resolve with metoprolol, can take Lasix 20 mg as needed and hold if worse).  # Mild mitral valve stenosis -Start metoprolol succinate 25 mg daily (started mainly for HCM indication) -Repeat echocardiogram every 3 years  # HTN, controlled -Continue lisinopril 10 mg once daily  # DM2 -Continue semaglutide/Ozempic per PCP -Previously on Jardiance and was discontinued due to yeast infection  # Severe hypercholesterolemia, LDL more than 190, currently not at goal # Statin  intolerance -Patient was intolerant to statins. Started on Repatha by PCP. Continue Repatha Q 2 weeks.   I have spent a total of 45 minutes with patient reviewing chart, EKGs, labs and examining patient as well as establishing an assessment and plan that was discussed with the patient.  > 50% of time was spent in direct patient care.       Medication Adjustments/Labs and Tests Ordered: Current medicines  are reviewed at length with the patient today.  Concerns regarding medicines are outlined above.   Tests Ordered: Orders Placed This Encounter  Procedures   MR CARDIAC MORPHOLOGY W WO CONTRAST   CBC   Basic metabolic panel   Ambulatory referral to Structural Heart/Valve Clinic (only at [place])   EKG 12-Lead    Medication Changes: Meds ordered this encounter  Medications   metoprolol succinate (TOPROL XL) 25 MG 24 hr tablet    Sig: Take 1 tablet (25 mg total) by mouth daily.    Dispense:  90 tablet    Refill:  3   furosemide (LASIX) 20 MG tablet    Sig: Take 1 tablet (20 mg total) by mouth daily as needed.    Dispense:  30 tablet    Refill:  3    Disposition:  Follow up  6 weeks  Signed, Leara Rawl Fidel Levy, MD, 01/01/2023 8:53 AM    [facility] [address] [place], [place] [address]

## 2023-01-01 NOTE — H&P (View-Only) (Signed)
Cardiology Office Note  Date: 01/01/2023   ID: Anijha, Smolinsky [birth year], MRN [id number]  PCP:  Eugenia Pancoast, [place]  Cardiologist:  None Electrophysiologist:  None   Reason for Office Visit: Evaluation of severe aortic valve stenosis at the request of Dugal, FNP   History of Present Illness: Abigail [name] is a 72 y.o. female known to have HTN, DM 2, COPD was referred to cardiology clinic for evaluation of abnormal echocardiogram, severe aortic valve stenosis.  Patient underwent echocardiogram on 12/09/2022 that showed LVEF 65 to 70%, severe asymmetric basal septal hypertrophy measuring 1.9 cm, mild SAM of the anterior MV leaflet, dynamic subvalvular gradient, peak gradient at least 25 mmHg, G1 DD, normal RV systolic function, severely dilated LA, mild mitral valve stenosis, severe stenosis of aortic valve with mean gradient 51 mmHg, peak gradient 88.6 mmHg, valve area 0.73 cm (aortic valve area by VTI was 1.04 cm and mean gradient 24 mmHg on 04/2021 study). She has symptoms of DOE for quite a while and denied any orthopnea/PND. Denies any angina, dizziness, lightheadedness, syncope, leg swelling. Denies smoking cigarettes. No family history of PCI/CABG/CHF/HCM. She also said she does not know much history on her father side.  Past Medical History:  Diagnosis Date   Allergy    Anxiety    Arthritis    hands, knees   Cancer ([place])    Cataract    surgery to remove -bilateral   COPD (chronic obstructive pulmonary disease) ([place])    Depression    Diabetes mellitus ([place])    a. A1c 6.6 in 05/2013 indicating new dx.   Fatty liver    Female stress incontinence    GERD (gastroesophageal reflux disease)    Heart murmur    no problems   Hemorrhoids    History of kidney stones    surgery to remove   Hyperlipidemia    Hyperplastic colon polyp 06/07/2008   Hypertension    Hypertriglyceridemia    IBS (irritable bowel syndrome)    Lung nodule    a. 62m left lung nodule by CT 05/2013.    Neuromuscular disorder (HCC)    neuropathy feet   Obesity    Psoriasis    SCCA (squamous cell carcinoma) of skin 01/16/2022   Left Malar Cheek (in situ) (tx p bx)   Skin cancer    Squamous cell carcinoma of skin 01/16/2022   Right Breast (in situ) (curet and 5FU)    Past Surgical History:  Procedure Laterality Date   CATARACT EXTRACTION W/PHACO Left 10/06/2014   Procedure: CATARACT EXTRACTION PHACO AND INTRAOCULAR LENS PLACEMENT LEFT EYE;  Surgeon: KTonny Branch MD;  [facility]: [facility];  Service: Ophthalmology;  Laterality: Left;  CDE 7.35   CATARACT EXTRACTION W/PHACO Right 11/14/2014   Procedure: CATARACT EXTRACTION PHACO AND INTRAOCULAR LENS PLACEMENT RIGHT EYE;  Surgeon: KTonny Branch MD;  Location: [facility];  Service: Ophthalmology;  Laterality: Right;  CDE:6.39   COLONOSCOPY  06/2008   hx polyps   CYSTOSCOPY W/ RETROGRADES Right 01/21/2017   Procedure: CYSTOSCOPY WITH RETROGRADE PYELOGRAM;  Surgeon: AHollice Espy MD;  Location: [facility];  Service: Urology;  Laterality: Right;   CYSTOSCOPY/URETEROSCOPY/HOLMIUM LASER/STENT PLACEMENT Left 01/21/2017   Procedure: CYSTOSCOPY/URETEROSCOPY/HOLMIUM LASER/STENT PLACEMENT;  Surgeon: AHollice Espy MD;  Location: [facility];  Service: Urology;  Laterality: Left;   DORSAL COMPARTMENT RELEASE Left 06/15/2015   Procedure: LEFT WRIST DEQUERVAINS;  Surgeon: DNinetta Lights MD;  [place] [facility]  Service: Orthopedics;  Laterality: Left;  HERNIA REPAIR     MOUTH SURGERY     tooth ext   NASAL SINUS SURGERY     SHOULDER SURGERY     right   TONSILLECTOMY     UPPER GI ENDOSCOPY     normal per patient   VENTRAL HERNIA REPAIR      Current Outpatient Medications  Medication Sig Dispense Refill   albuterol (VENTOLIN HFA) 108 (90 Base) MCG/ACT inhaler Inhale 2 puffs into the lungs every 4 (four) hours as needed for wheezing or shortness of breath. 1 each 1   Azelastine HCl 137 MCG/SPRAY SOLN as needed.     clobetasol (TEMOVATE) 0.05 %  external solution Apply 1 application topically 2 (two) times daily. 50 mL 2   diclofenac (FLECTOR) 1.3 % PTCH Place 1 patch onto the skin 2 (two) times daily. 60 patch 6   Dietary Management Product (NEOKE BHB) POWD Take 1 Dose by mouth.     DULoxetine (CYMBALTA) 30 MG capsule TAKE 1 CAPSULE(30 MG) BY MOUTH DAILY 90 capsule 2   esomeprazole (NEXIUM) 40 MG capsule Take 1 capsule (40 mg total) by mouth 2 (two) times daily before a meal. 60 capsule 6   Evolocumab (REPATHA SURECLICK) [id number] MG/ML SOAJ Inject 140 mg [place] every two weeks 2 mL 5   furosemide (LASIX) 20 MG tablet Take 1 tablet (20 mg total) by mouth daily as needed. 30 tablet 3   gabapentin (NEURONTIN) 600 MG tablet TAKE 2 TABLETS BY MOUTH IN THE MORNING AND AT NIGHT 360 tablet 2   glucose blood (FREESTYLE LITE) test strip Use as instructed to monitor FSBS 1x daily. Dx: E11.9 100 each 1   glucose monitoring kit (FREESTYLE) monitoring kit 1 each by Does not apply route as needed for other. Dispense one glucometer of choice, testing strips/lancets for once per day glucose checks.  QS for 1 month, 11 refills. 1 each 0   Lancets (FREESTYLE) lancets Use as instructed 100 each 12   lisinopril (ZESTRIL) 10 MG tablet Take 10 mg by mouth daily.     metoprolol succinate (TOPROL XL) 25 MG 24 hr tablet Take 1 tablet (25 mg total) by mouth daily. 90 tablet 3   Semaglutide, 2 MG/DOSE, 8 MG/3ML SOPN Inject 2 mg as directed once a week. 3 mL 1   No current facility-administered medications for this visit.   Allergies:  Statins and Cinnamon   Social History: The patient  reports that she has never smoked. She has never used smokeless tobacco. She reports that she does not drink alcohol and does not use drugs.   Family History: The patient's family history includes Breast cancer in her mother; Colon polyps in her maternal aunt; Heart disease in her maternal grandfather.   ROS:  Please see the history of present illness. Otherwise, complete review of  systems is positive for none.  All other systems are reviewed and negative.   Physical Exam: VS:  BP 136/74   Pulse 70   Ht '5\' 2"'$  (1.575 m)   Wt 183 lb (83 kg)   SpO2 93%   BMI 33.47 kg/m , BMI Body mass index is 33.47 kg/m.  Wt Readings from Last 3 Encounters:  01/01/23 183 lb (83 kg)  11/08/22 182 lb (82.6 kg)  09/12/22 186 lb (84.4 kg)    General: Patient appears comfortable at rest. HEENT: Conjunctiva and lids normal, oropharynx clear with moist mucosa. Neck: Supple, no elevated JVP or carotid bruits, no thyromegaly. Lungs: Clear to auscultation, nonlabored  breathing at rest. Cardiac: Regular rate and rhythm, no S3 or significant systolic murmur, no pericardial rub. Abdomen: Soft, nontender, no hepatomegaly, bowel sounds present, no guarding or rebound. Extremities: No pitting edema, distal pulses 2+. Skin: Warm and dry. Musculoskeletal: No kyphosis. Neuropsychiatric: Alert and oriented x3, affect grossly appropriate.  ECG:  An ECG dated 01/01/2023 was personally reviewed today and demonstrated:  Normal sinus rhythm, Q waves in inferior leads and T wave inversions in the lateral leads (1 and aVL).  Recent Labwork: 11/08/2022: ALT 19; AST 20; BUN 15; Creatinine, Ser 0.70; Potassium 4.5; Sodium 141     Component Value Date/Time   CHOL 282 (H) 11/08/2022 1233   TRIG 224.0 (H) 11/08/2022 1233   HDL 51.00 11/08/2022 1233   CHOLHDL 6 11/08/2022 1233   VLDL 44.8 (H) 11/08/2022 1233   LDLCALC 168 (H) 07/02/2021 1224   LDLDIRECT 204.0 11/08/2022 1233    Other Studies Reviewed Today: Echocardiogram on 12/09/2022 LVEF 65 to 70%, severe asymmetric basal septal hypertrophy measuring 1.9 cm, mild SAM of the anterior MV leaflet, dynamic subvalvular gradient, peak gradient at least 25 mmHg, G1 DD, normal RV systolic function, severely dilated LA, mild mitral valve stenosis, severe stenosis of aortic valve with mean gradient 51 mmHg, peak gradient 88.6 mmHg, valve area 0.73 cm (aortic  valve area by VTI was 1.04 cm and mean gradient 24 mmHg on 04/2021 study).  Assessment and Plan: Patient is a 72 year old F known to have valvular heart disease (severe aortic valve stenosis, mild mitral valve stenosis per 12/2022 echocardiogram), HTN, DM 2, COPD was referred to cardiology clinic for evaluation of valvular heart disease.  # Severe aortic valve stenosis, symptomatic -Echocardiogram from 12/2022 showed severe aortic valve stenosis with mean gradient 51 mmHg, peak gradient 88.6 mmHg, V-max 4.7 cm/s and valve area 0.73 cm. Patient has been symptomatic with DOE for quite a while. Denies angina, dizziness/lightheadedness and syncope. She will benefit from [facility] and RHC for further evaluation of severe aortic valve stenosis. Risks and benefits of cardiac catheterization (LHC and RHC) have been discussed with the patient.  These include bleeding, infection, kidney damage, stroke, heart attack, death.  The patient understands these risks and is willing to proceed. Patient is scheduled to undergo LHC/RHC on 01/08/2023 with Dr. Burt Knack at 10 AM. -Referral to structural heart clinic  # Hypertrophic cardiomyopathy -Echocardiogram from 12/2022 showed severe asymmetric basal septal hypertrophy measuring 1.9 cm, mild SAM of the anterior MV leaflet and dynamic subvalvular gradient, peak gradient at least 25 mmHg. Obtain cardiac MRI for further evaluation. -Start metoprolol succinate 25 mg daily (if SOB symptoms do not resolve with metoprolol, can take Lasix 20 mg as needed and hold if worse).  # Mild mitral valve stenosis -Start metoprolol succinate 25 mg daily (started mainly for HCM indication) -Repeat echocardiogram every 3 years  # HTN, controlled -Continue lisinopril 10 mg once daily  # DM2 -Continue semaglutide/Ozempic per PCP -Previously on Jardiance and was discontinued due to yeast infection  # Severe hypercholesterolemia, LDL more than 190, currently not at goal # Statin  intolerance -Patient was intolerant to statins. Started on Repatha by PCP. Continue Repatha Q 2 weeks.   I have spent a total of 45 minutes with patient reviewing chart, EKGs, labs and examining patient as well as establishing an assessment and plan that was discussed with the patient.  > 50% of time was spent in direct patient care.       Medication Adjustments/Labs and Tests Ordered: Current medicines  are reviewed at length with the patient today.  Concerns regarding medicines are outlined above.   Tests Ordered: Orders Placed This Encounter  Procedures   MR CARDIAC MORPHOLOGY W WO CONTRAST   CBC   Basic metabolic panel   Ambulatory referral to Structural Heart/Valve Clinic (only at [place])   EKG 12-Lead    Medication Changes: Meds ordered this encounter  Medications   metoprolol succinate (TOPROL XL) 25 MG 24 hr tablet    Sig: Take 1 tablet (25 mg total) by mouth daily.    Dispense:  90 tablet    Refill:  3   furosemide (LASIX) 20 MG tablet    Sig: Take 1 tablet (20 mg total) by mouth daily as needed.    Dispense:  30 tablet    Refill:  3    Disposition:  Follow up  6 weeks  Signed, Arion Shankles Fidel Levy, MD, 01/01/2023 8:53 AM    [facility] at [facility] [address], [place], [place] [address]

## 2023-01-01 NOTE — Patient Instructions (Addendum)
Medication Instructions:  Your physician has recommended you make the following change in your medication:  - Start Metoprolol Succinate 25 mg tablets once daily - Start Lasix 20 mg tablets once daily as needed.  *If you need a refill on your cardiac medications before your next appointment, please call your pharmacy*   Lab Work: BMET CBC  If you have labs (blood work) drawn today and your tests are completely normal, you will receive your results only by: [place] (if you have MyChart) OR A paper copy in the mail If you have any lab test that is abnormal or we need to change your treatment, we will call you to review the results.   Testing/Procedures: L/R Heart Cath Cardiac MRI  Follow-Up: At [facility], you and your health needs are our priority.  As part of our continuing mission to provide you with exceptional heart care, we have created designated Provider Care Teams.  These Care Teams include your primary Cardiologist (physician) and Advanced Practice Providers (APPs -  Physician Assistants and Nurse Practitioners) who all work together to provide you with the care you need, when you need it.  We recommend signing up for the patient portal called "MyChart".  Sign up information is provided on this After Visit Summary.  MyChart is used to connect with patients for Virtual Visits (Telemedicine).  Patients are able to view lab/test results, encounter notes, upcoming appointments, etc.  Non-urgent messages can be sent to your provider as well.   To learn more about what you can do with MyChart, go to [email or web address].    Your next appointment:   6 week(s)  Provider:   Claudina Lick, MD    You have been referred to [facility]. They will call you with your first appointment.  Other Instructions   [place] [facility] DEPT OF [place] [id number] [place]  [place] [address] Dept: [phone] Loc: [place]  01/01/2023  You are scheduled for a Cardiac Catheterization on Wednesday, March 6 with Dr. Sherren Mocha.  1. Please arrive at the [facility] (Main Entrance A) at [facility]: [address] [place], [place] [address] at 8:00 AM (This time is two hours before your procedure to ensure your preparation). Free valet parking service is available.   Special note: Every effort is made to have your procedure done on time. Please understand that emergencies sometimes delay scheduled procedures.  2. Diet: Do not eat solid foods after midnight.  The patient may have clear liquids until 5am upon the day of the procedure.  3. Labs: You will need to have blood drawn on TODAY at [facility]. You do not need to be fasting.  4. Medication instructions in preparation for your procedure:   Contrast Allergy: No  Stop taking, Lisinopril (Zestril or Prinivil) Tuesday, March 5,  On the morning of your procedure, take your Aspirin 81 mg and any morning medicines NOT listed above.  You may use sips of water.  5. Plan for one night stay--bring personal belongings. 6. Bring a current list of your medications and current insurance cards. 7. You MUST have a responsible person to drive you home. 8. Someone MUST be with you the first 24 hours after you arrive home or your discharge will be delayed. 9. Please wear clothes that are easy to get on and off and wear slip-on shoes.  Thank you  for allowing [place] to care for you!   -- [place] [facility]     You are scheduled for Cardiac MRI on ______________. Please arrive for your appointment at ______________ ( arrive 30-45 minutes prior to test start time). ?  [facility] [address] [place], [place] [address] [phone] Please take advantage of the free valet parking available at the MAIN entrance (A entrance).  Proceed to the [facility] Radiology Department (First Floor) for check-in.   [facility] [place], [place] [address] [phone] Please take advantage of the free valet parking available at the MAIN entrance. Proceed to [facility] registration for check-in (first floor).  Magnetic resonance imaging (MRI) is a painless test that produces images of the inside of the body without using Xrays.  During an MRI, strong magnets and radio waves work together in a [employer] officer, political party to form detailed images.   MRI images may provide more details about a medical condition than X-rays, CT scans, and ultrasounds can provide.  You may be given earphones to listen for instructions.  You may eat a light breakfast and take medications as ordered with the exception of furosemide, hydrochlorothiazide, or spironolactone(fluid pill, other). Please avoid stimulants for 12 hr prior to test. (Ie. Caffeine, nicotine, chocolate, or antihistamine medications)  If a contrast material will be used, an IV will be inserted into one of your veins. Contrast material will be injected into your IV. It will leave your body through your urine within a day. You may be told to drink plenty of fluids to help flush the contrast material out of your system.  You will be asked to remove all metal, including: Watch, jewelry, and other metal objects including hearing aids, hair pieces and dentures. Also wearable glucose monitoring systems (ie. Freestyle Libre and Omnipods) (Braces and fillings normally are not a problem.)   TEST WILL TAKE APPROXIMATELY 1 HOUR  PLEASE NOTIFY SCHEDULING AT LEAST 24 HOURS IN ADVANCE IF YOU ARE UNABLE TO KEEP YOUR APPOINTMENT. [phone]  Please call Marchia Bond, cardiac imaging nurse navigator with any questions/concerns. Marchia Bond RN Navigator Cardiac Imaging Gordy Clement RN Navigator Cardiac Imaging [facility] [phone] Office

## 2023-01-02 ENCOUNTER — Telehealth: Payer: Self-pay

## 2023-01-02 NOTE — Telephone Encounter (Signed)
-----   Message from Chalmers Guest, MD sent at 01/02/2023 11:09 AM EST ----- Normal CBC and BMP.

## 2023-01-02 NOTE — Telephone Encounter (Signed)
Patient notified and verbalized understanding. Patient had no questions at this time. PCP copied.

## 2023-01-06 ENCOUNTER — Telehealth: Payer: Self-pay | Admitting: *Deleted

## 2023-01-06 NOTE — Telephone Encounter (Signed)
Cardiac Catheterization scheduled at [facility] for: Wednesday January 08, 2023 10 AM Arrival time and place: [facility] Main Entrance A at: 8 AM  Nothing to eat after midnight prior to procedure, clear liquids until 5 AM day of procedure.  Medication instructions: -Hold:  Lasix-AM of procedure  Ozempic-weekly on Tuesdays-will hold until post procedure -Other usual morning medications can be taken with sips of water including aspirin 81 mg.  Confirmed patient has responsible adult to drive home post procedure and be with patient first 24 hours after arriving home.  Reviewed procedure instructions with patient.

## 2023-01-08 ENCOUNTER — Other Ambulatory Visit: Payer: Self-pay

## 2023-01-08 ENCOUNTER — Ambulatory Visit (HOSPITAL_COMMUNITY)
Admission: RE | Admit: 2023-01-08 | Discharge: 2023-01-08 | Disposition: A | Discharge status: Home / Self Care | Payer: Medicare Other | Attending: Cardiovascular Disease | Admitting: Cardiovascular Disease

## 2023-01-08 ENCOUNTER — Encounter (HOSPITAL_COMMUNITY): Payer: Self-pay | Admitting: Cardiovascular Disease

## 2023-01-08 ENCOUNTER — Encounter (HOSPITAL_COMMUNITY)
Admission: RE | Disposition: A | Discharge status: Home / Self Care | Payer: Self-pay | Source: Home / Self Care | Attending: Cardiovascular Disease

## 2023-01-08 DIAGNOSIS — Z79899 Other long term (current) drug therapy: Secondary | ICD-10-CM | POA: Diagnosis not present

## 2023-01-08 DIAGNOSIS — I422 Other hypertrophic cardiomyopathy: Secondary | ICD-10-CM | POA: Diagnosis not present

## 2023-01-08 DIAGNOSIS — I10 Essential (primary) hypertension: Secondary | ICD-10-CM | POA: Diagnosis not present

## 2023-01-08 DIAGNOSIS — I2510 Atherosclerotic heart disease of native coronary artery without angina pectoris: Secondary | ICD-10-CM | POA: Insufficient documentation

## 2023-01-08 DIAGNOSIS — I350 Nonrheumatic aortic (valve) stenosis: Secondary | ICD-10-CM

## 2023-01-08 DIAGNOSIS — E782 Mixed hyperlipidemia: Secondary | ICD-10-CM | POA: Diagnosis not present

## 2023-01-08 DIAGNOSIS — Z7985 Long-term (current) use of injectable non-insulin antidiabetic drugs: Secondary | ICD-10-CM | POA: Insufficient documentation

## 2023-01-08 DIAGNOSIS — I358 Other nonrheumatic aortic valve disorders: Secondary | ICD-10-CM

## 2023-01-08 DIAGNOSIS — J449 Chronic obstructive pulmonary disease, unspecified: Secondary | ICD-10-CM | POA: Insufficient documentation

## 2023-01-08 DIAGNOSIS — E119 Type 2 diabetes mellitus without complications: Secondary | ICD-10-CM | POA: Diagnosis not present

## 2023-01-08 HISTORY — PX: RIGHT/LEFT HEART CATH AND CORONARY ANGIOGRAPHY: CATH118266

## 2023-01-08 LAB — POCT I-STAT EG7
Acid-Base Excess: 0 mmol/L (ref 0.0–2.0)
Bicarbonate: 25.8 mmol/L (ref 20.0–28.0)
Calcium, Ion: 1.24 mmol/L (ref 1.15–1.40)
HCT: 35 % — ABNORMAL LOW (ref 36.0–46.0)
Hemoglobin: 11.9 g/dL — ABNORMAL LOW (ref 12.0–15.0)
O2 Saturation: 70 %
Potassium: 3.6 mmol/L (ref 3.5–5.1)
Sodium: 142 mmol/L (ref 135–145)
TCO2: 27 mmol/L (ref 22–32)
pCO2, Ven: 47.5 mmHg (ref 44–60)
pH, Ven: 7.342 (ref 7.25–7.43)
pO2, Ven: 39 mmHg (ref 32–45)

## 2023-01-08 LAB — POCT I-STAT 7, (LYTES, BLD GAS, ICA,H+H)
Acid-base deficit: 1 mmol/L (ref 0.0–2.0)
Bicarbonate: 25.3 mmol/L (ref 20.0–28.0)
Calcium, Ion: 1.23 mmol/L (ref 1.15–1.40)
HCT: 35 % — ABNORMAL LOW (ref 36.0–46.0)
Hemoglobin: 11.9 g/dL — ABNORMAL LOW (ref 12.0–15.0)
O2 Saturation: 96 %
Potassium: 3.6 mmol/L (ref 3.5–5.1)
Sodium: 142 mmol/L (ref 135–145)
TCO2: 27 mmol/L (ref 22–32)
pCO2 arterial: 46 mmHg (ref 32–48)
pH, Arterial: 7.349 — ABNORMAL LOW (ref 7.35–7.45)
pO2, Arterial: 83 mmHg (ref 83–108)

## 2023-01-08 LAB — GLUCOSE, CAPILLARY
Glucose-Capillary: 131 mg/dL — ABNORMAL HIGH (ref 70–99)
Glucose-Capillary: 105 mg/dL — ABNORMAL HIGH (ref 70–99)

## 2023-01-08 SURGERY — RIGHT/LEFT HEART CATH AND CORONARY ANGIOGRAPHY
Anesthesia: LOCAL

## 2023-01-08 MED ORDER — SODIUM CHLORIDE 0.9 % WEIGHT BASED INFUSION: 1.0000 mL/kg/h | INTRAVENOUS | Status: DC

## 2023-01-08 MED ORDER — SODIUM CHLORIDE 0.9% FLUSH: 3.0000 mL | INTRAVENOUS | Status: DC | PRN

## 2023-01-08 MED ORDER — SODIUM CHLORIDE 0.9% FLUSH: 3.0000 mL | Freq: Two times a day (BID) | INTRAVENOUS | Status: DC

## 2023-01-08 MED ORDER — FENTANYL CITRATE (PF) 100 MCG/2ML IJ SOLN
INTRAMUSCULAR | Status: DC | PRN
  Administered 2023-01-08: 25 ug via INTRAVENOUS

## 2023-01-08 MED ORDER — SODIUM CHLORIDE 0.9 % IV SOLN: 250.0000 mL | INTRAVENOUS | Status: DC | PRN

## 2023-01-08 MED ORDER — SODIUM CHLORIDE 0.9 % WEIGHT BASED INFUSION
3.0000 mL/kg/h | INTRAVENOUS | Status: AC
  Administered 2023-01-08: 3 mL/kg/h via INTRAVENOUS

## 2023-01-08 MED ORDER — HEPARIN SODIUM (PORCINE) 1000 UNIT/ML IJ SOLN
INTRAMUSCULAR | Status: AC
  Filled 2023-01-08: qty 10

## 2023-01-08 MED ORDER — ONDANSETRON HCL 4 MG/2ML IJ SOLN: 4.0000 mg | Freq: Four times a day (QID) | INTRAMUSCULAR | Status: DC | PRN

## 2023-01-08 MED ORDER — ACETAMINOPHEN 325 MG PO TABS: 650.0000 mg | ORAL_TABLET | ORAL | Status: DC | PRN

## 2023-01-08 MED ORDER — LABETALOL HCL 5 MG/ML IV SOLN: 10.0000 mg | INTRAVENOUS | Status: DC | PRN

## 2023-01-08 MED ORDER — MIDAZOLAM HCL 2 MG/2ML IJ SOLN
INTRAMUSCULAR | Status: AC
  Filled 2023-01-08: qty 2

## 2023-01-08 MED ORDER — ASPIRIN 81 MG PO CHEW: 81.0000 mg | CHEWABLE_TABLET | ORAL | Status: DC

## 2023-01-08 MED ORDER — VERAPAMIL HCL 2.5 MG/ML IV SOLN
INTRAVENOUS | Status: AC
  Filled 2023-01-08: qty 2

## 2023-01-08 MED ORDER — VERAPAMIL HCL 2.5 MG/ML IV SOLN
INTRAVENOUS | Status: DC | PRN
  Administered 2023-01-08: 10 mL via INTRA_ARTERIAL

## 2023-01-08 MED ORDER — LIDOCAINE HCL (PF) 1 % IJ SOLN
INTRAMUSCULAR | Status: AC
  Filled 2023-01-08: qty 30

## 2023-01-08 MED ORDER — IOHEXOL 350 MG/ML SOLN
INTRAVENOUS | Status: DC | PRN
  Administered 2023-01-08: 45 mL

## 2023-01-08 MED ORDER — MIDAZOLAM HCL 2 MG/2ML IJ SOLN
INTRAMUSCULAR | Status: DC | PRN
  Administered 2023-01-08: 2 mg via INTRAVENOUS

## 2023-01-08 MED ORDER — FENTANYL CITRATE (PF) 100 MCG/2ML IJ SOLN
INTRAMUSCULAR | Status: AC
  Filled 2023-01-08: qty 2

## 2023-01-08 MED ORDER — HEPARIN (PORCINE) IN NACL 1000-0.9 UT/500ML-% IV SOLN
INTRAVENOUS | Status: DC | PRN
  Administered 2023-01-08 (×2): 500 mL

## 2023-01-08 MED ORDER — LIDOCAINE HCL (PF) 1 % IJ SOLN
INTRAMUSCULAR | Status: DC | PRN
  Administered 2023-01-08 (×2): 2 mL

## 2023-01-08 MED ORDER — HEPARIN SODIUM (PORCINE) 1000 UNIT/ML IJ SOLN
INTRAMUSCULAR | Status: DC | PRN
  Administered 2023-01-08: 4000 [IU] via INTRAVENOUS

## 2023-01-08 MED ORDER — HYDRALAZINE HCL 20 MG/ML IJ SOLN: 10.0000 mg | INTRAMUSCULAR | Status: DC | PRN

## 2023-01-08 SURGICAL SUPPLY — 12 items
CATH 5FR JL3.5 JR4 ANG PIG MP (CATHETERS) IMPLANT
CATH BALLN WEDGE 5F 110CM (CATHETERS) IMPLANT
DEVICE RAD COMP TR BAND LRG (VASCULAR PRODUCTS) IMPLANT
GLIDESHEATH SLEND SS 6F .021 (SHEATH) IMPLANT
GUIDEWIRE .025 260CM (WIRE) IMPLANT
GUIDEWIRE INQWIRE 1.5J.035X260 (WIRE) IMPLANT
INQWIRE 1.5J .035X260CM (WIRE) ×1
KIT HEART LEFT (KITS) ×1 IMPLANT
PACK CARDIAC CATHETERIZATION (CUSTOM PROCEDURE TRAY) ×1 IMPLANT
SHEATH GLIDE SLENDER 4/5FR (SHEATH) IMPLANT
TRANSDUCER W/STOPCOCK (MISCELLANEOUS) ×1 IMPLANT
TUBING CIL FLEX 10 FLL-RA (TUBING) ×1 IMPLANT

## 2023-01-08 NOTE — Interval H&P Note (Signed)
History and Physical Interval Note:  01/08/2023 9:25 AM  Abigail [name]  has presented today for surgery, with the diagnosis of aortic stenosis.  The various methods of treatment have been discussed with the patient and family. After consideration of risks, benefits and other options for treatment, the patient has consented to  Procedure(s): RIGHT/LEFT HEART CATH AND CORONARY ANGIOGRAPHY (N/A) as a surgical intervention.  The patient's history has been reviewed, patient examined, no change in status, stable for surgery.  I have reviewed the patient's chart and labs.  Questions were answered to the patient's satisfaction.     Sherren Mocha

## 2023-01-08 NOTE — Progress Notes (Signed)
TR BAND REMOVAL  LOCATION:    right radial  DEFLATED PER PROTOCOL:    Yes.    TIME BAND OFF / DRESSING APPLIED:    1205 gauze dressing applied   SITE UPON ARRIVAL:    Level 0  SITE AFTER BAND REMOVAL:    Level 0  CIRCULATION SENSATION AND MOVEMENT:    Within Normal Limits   Yes.    COMMENTS:   no issues noted

## 2023-01-09 NOTE — Progress Notes (Signed)
noted 

## 2023-01-09 NOTE — Progress Notes (Signed)
Slight anemia from labs drawn in hospital yesterday was this before procedure with cath?

## 2023-01-13 ENCOUNTER — Ambulatory Visit (HOSPITAL_COMMUNITY)
Admission: RE | Admit: 2023-01-13 | Discharge: 2023-01-13 | Disposition: A | Discharge status: Home / Self Care | Payer: Medicare Other | Source: Ambulatory Visit | Attending: Cardiovascular Disease | Admitting: Cardiovascular Disease

## 2023-01-13 DIAGNOSIS — I350 Nonrheumatic aortic (valve) stenosis: Secondary | ICD-10-CM | POA: Insufficient documentation

## 2023-01-13 MED ORDER — IOHEXOL 350 MG/ML SOLN
100.0000 mL | Freq: Once | INTRAVENOUS | Status: AC | PRN
  Administered 2023-01-13: 100 mL via INTRAVENOUS

## 2023-01-13 MED ORDER — METOPROLOL TARTRATE 5 MG/5ML IV SOLN: 10.0000 mg | INTRAVENOUS | Status: DC | PRN

## 2023-01-13 MED ORDER — DILTIAZEM HCL 25 MG/5ML IV SOLN: 10.0000 mg | INTRAVENOUS | Status: DC | PRN

## 2023-01-14 ENCOUNTER — Ambulatory Visit (INDEPENDENT_AMBULATORY_CARE_PROVIDER_SITE_OTHER): Payer: Medicare Other | Admitting: Family

## 2023-01-14 ENCOUNTER — Encounter: Payer: Self-pay | Admitting: Family

## 2023-01-14 VITALS — BP 118/68 | HR 72 | Temp 98.1°F | Ht 62.0 in | Wt 182.0 lb

## 2023-01-14 DIAGNOSIS — I350 Nonrheumatic aortic (valve) stenosis: Secondary | ICD-10-CM | POA: Diagnosis not present

## 2023-01-14 DIAGNOSIS — J0110 Acute frontal sinusitis, unspecified: Secondary | ICD-10-CM

## 2023-01-14 DIAGNOSIS — E7800 Pure hypercholesterolemia, unspecified: Secondary | ICD-10-CM

## 2023-01-14 DIAGNOSIS — D649 Anemia, unspecified: Secondary | ICD-10-CM | POA: Diagnosis not present

## 2023-01-14 MED ORDER — AMOXICILLIN-POT CLAVULANATE 875-125 MG PO TABS: 1.0000 | ORAL_TABLET | Freq: Two times a day (BID) | ORAL | 0 refills | Status: DC

## 2023-01-14 NOTE — Progress Notes (Signed)
Established Patient Office Visit  Subjective:   Patient ID: Abigail [name], female    DOB: [birth year]  Age: 72 y.o. MRN: [id number]  CC:  Chief Complaint  Patient presents with   Sinus Problem    HPI: Abigail [name] is a 72 y.o. female presenting on 01/14/2023 for Sinus Problem   Sinus Problem    C/o nasal congestion, sinus pressure, for the last one week Has sob but this had started prior, currently undergoing cardiac workup for her murmur. Denies cough or chest congestion.   No sore throat no ear pain. No fever or chills.  Taking otc zyrtec without much relief.   3/6 had scan to evaluate severe aortic valve stenosis.  Echo on 3/5 with severe basal septal hypertrophy and severe stenosis  Discussed with pt at that visit possible cardiac cath.   HLD; started repatha about two weeks ago.     ROS: Negative unless specifically indicated above in HPI.   Relevant past medical history reviewed and updated as indicated.   Allergies and medications reviewed and updated.   Current Outpatient Medications:    albuterol (VENTOLIN HFA) 108 (90 Base) MCG/ACT inhaler, Inhale 2 puffs into the lungs every 4 (four) hours as needed for wheezing or shortness of breath., Disp: 1 each, Rfl: 1   amoxicillin-clavulanate (AUGMENTIN) 875-125 MG tablet, Take 1 tablet by mouth 2 (two) times daily., Disp: 20 tablet, Rfl: 0   Azelastine HCl 137 MCG/SPRAY SOLN, Place 1 spray into both nostrils daily as needed (allergies/sinus issues.)., Disp: , Rfl:    clobetasol (TEMOVATE) 0.05 % external solution, Apply 1 application topically 2 (two) times daily. (Patient taking differently: Apply 1 application  topically 2 (two) times daily as needed (psoriasis).), Disp: 50 mL, Rfl: 2   COSENTYX SENSOREADY, 300 MG, 150 MG/ML SOAJ, Inject 300 mg into the skin every 30 (thirty) days., Disp: , Rfl:    diclofenac (FLECTOR) 1.3 % PTCH, Place 1 patch onto the skin 2 (two) times daily. (Patient taking differently: Place 1  patch onto the skin at bedtime as needed (neuropathy pain.).), Disp: 60 patch, Rfl: 6   Dietary Management Product (NEOKE BHB) POWD, Take 1 Dose by mouth., Disp: , Rfl:    DULoxetine (CYMBALTA) 30 MG capsule, TAKE 1 CAPSULE(30 MG) BY MOUTH DAILY (Patient taking differently: Take 30 mg by mouth at bedtime.), Disp: 90 capsule, Rfl: 2   esomeprazole (NEXIUM) 20 MG capsule, Take 20 mg by mouth in the morning and at bedtime., Disp: , Rfl:    Evolocumab (REPATHA SURECLICK) XX123456 MG/ML SOAJ, Inject 140 mg [place] every two weeks, Disp: 2 mL, Rfl: 5   furosemide (LASIX) 20 MG tablet, Take 1 tablet (20 mg total) by mouth daily as needed. (Patient taking differently: Take 20 mg by mouth daily.), Disp: 30 tablet, Rfl: 3   gabapentin (NEURONTIN) 600 MG tablet, TAKE 2 TABLETS BY MOUTH IN THE MORNING AND AT NIGHT, Disp: 360 tablet, Rfl: 2   glucose blood (FREESTYLE LITE) test strip, Use as instructed to monitor FSBS 1x daily. Dx: E11.9, Disp: 100 each, Rfl: 1   glucose monitoring kit (FREESTYLE) monitoring kit, 1 each by Does not apply route as needed for other. Dispense one glucometer of choice, testing strips/lancets for once per day glucose checks.  QS for 1 month, 11 refills., Disp: 1 each, Rfl: 0   Lancets (FREESTYLE) lancets, Use as instructed, Disp: 100 each, Rfl: 12   lisinopril (ZESTRIL) 10 MG tablet, Take 10 mg by mouth every  evening., Disp: , Rfl:    metoprolol succinate (TOPROL XL) 25 MG 24 hr tablet, Take 1 tablet (25 mg total) by mouth daily., Disp: 90 tablet, Rfl: 3   Semaglutide, 2 MG/DOSE, 8 MG/3ML SOPN, Inject 2 mg as directed once a week. (Patient taking differently: Inject 2 mg as directed every Tuesday.), Disp: 3 mL, Rfl: 1  Allergies  Allergen Reactions   Statins Other (See Comments)    Failed simvastatin, pravastatin, and rosuvastatin 5 mg weekly (myalgias)   Cinnamon     Dry throat     Objective:   BP 118/68   Pulse 72   Temp 98.1 F (36.7 C) (Temporal)   Ht '5\' 2"'$  (1.575 m)   Wt 182  lb (82.6 kg)   SpO2 98%   BMI 33.29 kg/m    Physical Exam Constitutional:      General: She is not in acute distress.    Appearance: Normal appearance. She is normal weight. She is not ill-appearing, toxic-appearing or diaphoretic.  HENT:     Head: Normocephalic.     Right Ear: Tympanic membrane normal.     Left Ear: Tympanic membrane normal.     Nose:     Right Sinus: Frontal sinus tenderness present.     Left Sinus: Frontal sinus tenderness present.     Mouth/Throat:     Mouth: Mucous membranes are dry.     Pharynx: Posterior oropharyngeal erythema present. No oropharyngeal exudate.  Eyes:     Extraocular Movements: Extraocular movements intact.     Pupils: Pupils are equal, round, and reactive to light.  Cardiovascular:     Rate and Rhythm: Normal rate and regular rhythm.     Pulses: Normal pulses.     Heart sounds: Normal heart sounds.  Pulmonary:     Effort: Pulmonary effort is normal.     Breath sounds: Normal breath sounds.  Musculoskeletal:     Cervical back: Normal range of motion.  Neurological:     General: No focal deficit present.     Mental Status: She is alert and oriented to person, place, and time. Mental status is at baseline.  Psychiatric:        Mood and Affect: Mood normal.        Behavior: Behavior normal.        Thought Content: Thought content normal.        Judgment: Judgment normal.      Assessment & Plan:  Acute non-recurrent frontal sinusitis Assessment & Plan: Rx sent to patient's pharmacy for Augmentin 875-125 BID. Advised patient to take OTC Tylenol or Ibuprofen as needed for pain. Also advised patient to use warm compresses for sinus pain as needed.  If symptoms do not improve or worsen, or if patient develops fever or chills patient is to notify provider.   Orders: -     Amoxicillin-Pot Clavulanate; Take 1 tablet by mouth 2 (two) times daily.  Dispense: 20 tablet; Refill: 0  Hypercholesterolemia with LDL greater than 190  mg/dL Assessment & Plan: Continue Repatha as prescribed Lipid panel ordered and pt advised to repeat in two months.   Orders: -     Lipid panel; Future  Anemia, unspecified type Assessment & Plan: CBC with differential and Ferritin + IBC ordered and pending.   Orders: -     CBC with Differential/Platelet; Future -     IBC + Ferritin; Future  Severe aortic stenosis Assessment & Plan: Continue f/u with cardiology as scheduled.  Follow up plan: Return in about 5 months (around 06/16/2023), or if symptoms worsen or fail to improve, for f/u diabetes.  Eugenia Pancoast, FNP

## 2023-01-14 NOTE — Progress Notes (Signed)
Established Patient Office Visit  Subjective:   Patient ID: Abigail [name], female    DOB: [birth year]  Age: 72 y.o. MRN: [id number]  CC:  Chief Complaint  Patient presents with   Sinus Problem    HPI: Abigail [name] is a 72 y.o. female presenting today for sinus issues.  Patient reports sinus pain, post-nasal drip, runny nose, nasal congestion that started 1 week ago. Patient has taken Zyrtec with no relief. Denies sore throat, fever, chills, nausea, vomiting, and wheezing. Has some shortness of breath but states that it has occurring before feeling sick.   HLD: started Repatha '140mg'$ /mL. Patient states that she is tolerating medication.      ROS: Negative unless specifically indicated above in HPI.   Relevant past medical history reviewed and updated as indicated.   Allergies and medications reviewed and updated.   Current Outpatient Medications:    albuterol (VENTOLIN HFA) 108 (90 Base) MCG/ACT inhaler, Inhale 2 puffs into the lungs every 4 (four) hours as needed for wheezing or shortness of breath., Disp: 1 each, Rfl: 1   amoxicillin-clavulanate (AUGMENTIN) 875-125 MG tablet, Take 1 tablet by mouth 2 (two) times daily., Disp: 20 tablet, Rfl: 0   Azelastine HCl 137 MCG/SPRAY SOLN, Place 1 spray into both nostrils daily as needed (allergies/sinus issues.)., Disp: , Rfl:    clobetasol (TEMOVATE) 0.05 % external solution, Apply 1 application topically 2 (two) times daily. (Patient taking differently: Apply 1 application  topically 2 (two) times daily as needed (psoriasis).), Disp: 50 mL, Rfl: 2   COSENTYX SENSOREADY, 300 MG, 150 MG/ML SOAJ, Inject 300 mg into the skin every 30 (thirty) days., Disp: , Rfl:    diclofenac (FLECTOR) 1.3 % PTCH, Place 1 patch onto the skin 2 (two) times daily. (Patient taking differently: Place 1 patch onto the skin at bedtime as needed (neuropathy pain.).), Disp: 60 patch, Rfl: 6   Dietary Management Product (NEOKE BHB) POWD, Take 1 Dose by mouth., Disp:  , Rfl:    DULoxetine (CYMBALTA) 30 MG capsule, TAKE 1 CAPSULE(30 MG) BY MOUTH DAILY (Patient taking differently: Take 30 mg by mouth at bedtime.), Disp: 90 capsule, Rfl: 2   esomeprazole (NEXIUM) 20 MG capsule, Take 20 mg by mouth in the morning and at bedtime., Disp: , Rfl:    Evolocumab (REPATHA SURECLICK) [id number] MG/ML SOAJ, Inject 140 mg [place] every two weeks, Disp: 2 mL, Rfl: 5   furosemide (LASIX) 20 MG tablet, Take 1 tablet (20 mg total) by mouth daily as needed. (Patient taking differently: Take 20 mg by mouth daily.), Disp: 30 tablet, Rfl: 3   gabapentin (NEURONTIN) 600 MG tablet, TAKE 2 TABLETS BY MOUTH IN THE MORNING AND AT NIGHT, Disp: 360 tablet, Rfl: 2   glucose blood (FREESTYLE LITE) test strip, Use as instructed to monitor FSBS 1x daily. Dx: E11.9, Disp: 100 each, Rfl: 1   glucose monitoring kit (FREESTYLE) monitoring kit, 1 each by Does not apply route as needed for other. Dispense one glucometer of choice, testing strips/lancets for once per day glucose checks.  QS for 1 month, 11 refills., Disp: 1 each, Rfl: 0   Lancets (FREESTYLE) lancets, Use as instructed, Disp: 100 each, Rfl: 12   lisinopril (ZESTRIL) 10 MG tablet, Take 10 mg by mouth every evening., Disp: , Rfl:    metoprolol succinate (TOPROL XL) 25 MG 24 hr tablet, Take 1 tablet (25 mg total) by mouth daily., Disp: 90 tablet, Rfl: 3   Semaglutide, 2 MG/DOSE, 8 MG/3ML SOPN,  Inject 2 mg as directed once a week. (Patient taking differently: Inject 2 mg as directed every Tuesday.), Disp: 3 mL, Rfl: 1  Allergies  Allergen Reactions   Statins Other (See Comments)    Failed simvastatin, pravastatin, and rosuvastatin 5 mg weekly (myalgias)   Cinnamon     Dry throat     Objective:   BP 118/68   Pulse 72   Temp 98.1 F (36.7 C) (Temporal)   Ht '5\' 2"'$  (1.575 m)   Wt 182 lb (82.6 kg)   SpO2 98%   BMI 33.29 kg/m    Physical Exam Constitutional:      General: She is not in acute distress.    Appearance: Normal appearance. She  is obese. She is not ill-appearing, toxic-appearing or diaphoretic.  HENT:     Head: Normocephalic.     Right Ear: Decreased hearing noted. No drainage, swelling or tenderness. A middle ear effusion is present.     Left Ear: Decreased hearing noted. No drainage, swelling or tenderness. A middle ear effusion is present.     Nose: No mucosal edema, congestion or rhinorrhea.     Right Sinus: Maxillary sinus tenderness and frontal sinus tenderness present.     Left Sinus: Maxillary sinus tenderness and frontal sinus tenderness present.     Mouth/Throat:     Mouth: Mucous membranes are dry.     Pharynx: Posterior oropharyngeal erythema present. No pharyngeal swelling.  Eyes:     Extraocular Movements: Extraocular movements intact.     Conjunctiva/sclera: Conjunctivae normal.     Pupils: Pupils are equal, round, and reactive to light.  Cardiovascular:     Rate and Rhythm: Normal rate and regular rhythm.     Pulses: Normal pulses.     Heart sounds: Murmur heard.  Pulmonary:     Effort: Pulmonary effort is normal.     Breath sounds: Normal breath sounds.  Skin:    General: Skin is warm and dry.  Neurological:     General: No focal deficit present.     Mental Status: She is alert and oriented to person, place, and time.  Psychiatric:        Mood and Affect: Mood normal.        Behavior: Behavior normal.        Thought Content: Thought content normal.        Judgment: Judgment normal.     Assessment & Plan:  Acute non-recurrent frontal sinusitis Assessment & Plan: Rx sent to patient's pharmacy for Augmentin 875-125 BID. Advised patient to take OTC Tylenol or Ibuprofen as needed for pain. Also advised patient to use warm compresses for sinus pain as needed.  If symptoms do not improve or worsen, or if patient develops fever or chills patient is to notify provider.   Orders: -     Amoxicillin-Pot Clavulanate; Take 1 tablet by mouth 2 (two) times daily.  Dispense: 20 tablet; Refill:  0  Hypercholesterolemia with LDL greater than 190 mg/dL Assessment & Plan: Continue Repatha as prescribed Lipid panel ordered and pending.  Orders: -     Lipid panel; Future  Anemia, unspecified type Assessment & Plan: CBC with differential and Ferritin + IBC ordered and pending.   Orders: -     CBC with Differential/Platelet; Future -     IBC + Ferritin; Future     Follow up plan: Return in about 5 months (around 06/16/2023), or if symptoms worsen or fail to improve, for f/u diabetes.  Sedalia Muta  Janan Ridge, RN AGNP-student

## 2023-01-14 NOTE — Patient Instructions (Signed)
  I have created an order for lab work today during our visit.  Please schedule an appointment on your way out to return to the lab at your convenience. Please return fasting at your lab appointment (meaning you can only drink black coffee and or water prior to your appointment). I will reach out to you in regards to the labs when I receive the results.    ------------------------------------  Start prescription of augmentin 875/125 mg and take as prescribed.  Tylenol ok for sinus pain as needed Increase oral fluids. Ok to continue with humidifers and hot steamy showers as discussed during visit.  It was a pleasure speaking with you today, I hope you start feeling better soon.  Regards,   Eugenia Pancoast

## 2023-01-14 NOTE — Assessment & Plan Note (Signed)
Continue f/u with cardiology as scheduled.

## 2023-01-14 NOTE — Assessment & Plan Note (Signed)
Rx sent to patient's pharmacy for Augmentin 875-125 BID. Advised patient to take OTC Tylenol or Ibuprofen as needed for pain. Also advised patient to use warm compresses for sinus pain as needed.  If symptoms do not improve or worsen, or if patient develops fever or chills patient is to notify provider.

## 2023-01-14 NOTE — Assessment & Plan Note (Addendum)
Continue Repatha as prescribed Lipid panel ordered and pt advised to repeat in two months.

## 2023-01-14 NOTE — Assessment & Plan Note (Signed)
CBC with differential and Ferritin + IBC ordered and pending.

## 2023-01-21 ENCOUNTER — Ambulatory Visit: Payer: Medicare Other | Admitting: Physician Assistant

## 2023-01-22 ENCOUNTER — Ambulatory Visit: Payer: Medicare Other | Admitting: Podiatry

## 2023-01-22 ENCOUNTER — Ambulatory Visit: Payer: Medicare Other | Attending: Cardiovascular Disease | Admitting: Cardiovascular Disease

## 2023-01-22 ENCOUNTER — Encounter: Payer: Self-pay | Admitting: Cardiovascular Disease

## 2023-01-22 VITALS — BP 126/60 | HR 74 | Ht 62.0 in | Wt 184.0 lb

## 2023-01-22 DIAGNOSIS — I350 Nonrheumatic aortic (valve) stenosis: Secondary | ICD-10-CM | POA: Diagnosis not present

## 2023-01-22 NOTE — Patient Instructions (Addendum)
Medication Instructions:  Your physician recommends that you continue on your current medications as directed. Please refer to the Current Medication list given to you today.  *If you need a refill on your cardiac medications before your next appointment, please call your pharmacy*  Lab Work: If you have labs (blood work) drawn today and your tests are completely normal, you will receive your results only by: [place] (if you have MyChart) OR A paper copy in the mail If you have any lab test that is abnormal or we need to change your treatment, we will call you to review the results.  Follow-Up: At [facility], you and your health needs are our priority.  As part of our continuing mission to provide you with exceptional heart care, we have created designated Provider Care Teams.  These Care Teams include your primary Cardiologist (physician) and Advanced Practice Providers (APPs -  Physician Assistants and Nurse Practitioners) who all work together to provide you with the care you need, when you need it.  We recommend signing up for the patient portal called "MyChart".  Sign up information is provided on this After Visit Summary.  MyChart is used to connect with patients for Virtual Visits (Telemedicine).  Patients are able to view lab/test results, encounter notes, upcoming appointments, etc.  Non-urgent messages can be sent to your provider as well.   To learn more about what you can do with MyChart, go to [email or web address].    Your next appointment:   Ander Purpura will be in contact with you to set-up appointments and procedures.  Please schedule an appointment with your dentist.

## 2023-01-22 NOTE — Progress Notes (Signed)
Cardiology Office Note:    Date:  01/22/2023   ID:  Abigail, [name] 23-Mar-1951, MRN [id number]  PCP:  Abigail [name], [place] Prov[facility] Cardiologist:  Abigail Guest, MD     Referring MD: Abigail Pancoast, FNP   Chief Complaint  Patient presents with   Aortic Stenosis    History of Present Illness:    Abigail [name] is a 72 y.o. female presenting for evaluation of severe aortic stenosis.   The patient has a history of HTN, Type II DM, and COPD. She was recently referred to Abigail [name] for evaluation of valvular heart disease after an echo showed severe aortic stenosis and mild mitral stenosis. Past echo studies from 2014 showed aortic sclerosis without stenosis in 2014 and moderate aortic stenosis in 2022 with a mean transaortic gradient in 2022. Her recent echo demonstrated vigorous LV function with an LVEF of 65-70%, severe basal septal hypertrophy with a septal diameter of 1.9 cm with dynamic LV outflow obstruction and peak gradient of 25 mmHg. Severe aortic stenosis is confirmed with peak and mean gradients of 89 and 51 mmHg, respectively. The peak velocity is 4.7 m/s and DI is 0.32. calculated AVA is 0.73 square cm.   The patient reports a heart murmur now for many years.  She had a transition in her primary care providers and had not had a surveillance echo for a few years until recently.  Because of her heart murmur characteristics, she was referred for a surveillance echocardiogram demonstrating severe aortic stenosis as outlined above.  From a symptomatic perspective, she reports exertional dyspnea when she walks to her mailbox and back.  She also has dyspnea with climbing a flight of stairs.  Her dyspnea is progressive over the past year.  She also experiences a feeling of discomfort in her arms when she walks uphill or upstairs.  She denies lightheadedness, syncope, or exertional chest pain or pressure.  The patient has diabetes with neuropathy.  She  otherwise has been in good health.  She has had hernia surgery and shoulder surgery, but no other significant surgeries or hospitalizations.  She denies history of stroke or TIA.  No history of bleeding problems or blood transfusion.  Dental care: She reports regular dental care but has not seen her dentist in about 1 year.  States that she needs to have 4 front lower teeth extracted and a partial plate.  She denies any acute infection or pain.  Reports history of several root canals.  Past Medical History:  Diagnosis Date   Allergy    Anxiety    Arthritis    hands, knees   Cancer ([place])    Cataract    surgery to remove -bilateral   COPD (chronic obstructive pulmonary disease) ([place])    Depression    Diabetes mellitus ([place])    a. A1c 6.6 in 05/2013 indicating new dx.   Fatty liver    Female stress incontinence    GERD (gastroesophageal reflux disease)    Heart murmur    no problems   Hemorrhoids    History of kidney stones    surgery to remove   Hyperlipidemia    Hyperplastic colon polyp 06/07/2008   Hypertension    Hypertriglyceridemia    IBS (irritable bowel syndrome)    Lung nodule    a. 81mm left lung nodule by CT 05/2013.   Neuromuscular disorder (HCC)    neuropathy feet   Obesity    Psoriasis  SCCA (squamous cell carcinoma) of skin 01/16/2022   Left Malar Cheek (in situ) (tx p bx)   Skin cancer    Squamous cell carcinoma of skin 01/16/2022   Right Breast (in situ) (curet and 5FU)    Past Surgical History:  Procedure Laterality Date   CATARACT EXTRACTION W/PHACO Left 10/06/2014   Procedure: CATARACT EXTRACTION PHACO AND INTRAOCULAR LENS PLACEMENT LEFT EYE;  Surgeon: Tonny Branch, MD;  Location: [facility];  Service: Ophthalmology;  Laterality: Left;  CDE 7.35   CATARACT EXTRACTION W/PHACO Right 11/14/2014   Procedure: CATARACT EXTRACTION PHACO AND INTRAOCULAR LENS PLACEMENT RIGHT EYE;  Surgeon: Tonny Branch, MD;  Location: [facility];  Service: Ophthalmology;  Laterality: Right;   CDE:6.39   COLONOSCOPY  06/2008   hx polyps   CYSTOSCOPY W/ RETROGRADES Right 01/21/2017   Procedure: CYSTOSCOPY WITH RETROGRADE PYELOGRAM;  Surgeon: Hollice Espy, MD;  Location: [facility];  Service: Urology;  Laterality: Right;   CYSTOSCOPY/URETEROSCOPY/HOLMIUM LASER/STENT PLACEMENT Left 01/21/2017   Procedure: CYSTOSCOPY/URETEROSCOPY/HOLMIUM LASER/STENT PLACEMENT;  Surgeon: Hollice Espy, MD;  Location: [facility];  Service: Urology;  Laterality: Left;   DORSAL COMPARTMENT RELEASE Left 06/15/2015   Procedure: LEFT WRIST DEQUERVAINS;  Surgeon: Ninetta Lights, MD;  Location: [facility];  Service: Orthopedics;  Laterality: Left;   HERNIA REPAIR     MOUTH SURGERY     tooth ext   NASAL SINUS SURGERY     RIGHT/LEFT HEART CATH AND CORONARY ANGIOGRAPHY N/A 01/08/2023   Procedure: RIGHT/LEFT HEART CATH AND CORONARY ANGIOGRAPHY;  Surgeon: Sherren Mocha, MD;  Location: [facility];  Service: Cardiovascular;  Laterality: N/A;   SHOULDER SURGERY     right   TONSILLECTOMY     UPPER GI ENDOSCOPY     normal per patient   VENTRAL HERNIA REPAIR      Current Medications: Current Meds  Medication Sig   albuterol (VENTOLIN HFA) 108 (90 Base) MCG/ACT inhaler Inhale 2 puffs into the lungs every 4 (four) hours as needed for wheezing or shortness of breath.   Azelastine HCl 137 MCG/SPRAY SOLN Place 1 spray into both nostrils daily as needed (allergies/sinus issues.).   clobetasol (TEMOVATE) 0.05 % external solution Apply 1 application topically 2 (two) times daily. (Patient taking differently: Apply 1 application  topically 2 (two) times daily as needed (psoriasis).)   COSENTYX SENSOREADY, 300 MG, 150 MG/ML SOAJ Inject 300 mg into the skin every 30 (thirty) days.   diclofenac (FLECTOR) 1.3 % PTCH Place 1 patch onto the skin 2 (two) times daily. (Patient taking differently: Place 1 patch onto the skin at bedtime as needed (neuropathy pain.).)   Dietary Management Product (NEOKE BHB) POWD  Take 1 Dose by mouth.   DULoxetine (CYMBALTA) 30 MG capsule TAKE 1 CAPSULE(30 MG) BY MOUTH DAILY (Patient taking differently: Take 30 mg by mouth at bedtime.)   esomeprazole (NEXIUM) 20 MG capsule Take 20 mg by mouth in the morning and at bedtime.   Evolocumab (REPATHA SURECLICK) [id number] MG/ML SOAJ Inject 140 mg [place] every two weeks   furosemide (LASIX) 20 MG tablet Take 1 tablet (20 mg total) by mouth daily as needed. (Patient taking differently: Take 20 mg by mouth daily.)   gabapentin (NEURONTIN) 600 MG tablet TAKE 2 TABLETS BY MOUTH IN THE MORNING AND AT NIGHT   glucose blood (FREESTYLE LITE) test strip Use as instructed to monitor FSBS 1x daily. Dx: E11.9   glucose monitoring kit (FREESTYLE) monitoring kit 1 each by Does not apply route as  needed for other. Dispense one glucometer of choice, testing strips/lancets for once per day glucose checks.  QS for 1 month, 11 refills.   Lancets (FREESTYLE) lancets Use as instructed   lisinopril (ZESTRIL) 10 MG tablet Take 10 mg by mouth every evening.   metoprolol succinate (TOPROL XL) 25 MG 24 hr tablet Take 1 tablet (25 mg total) by mouth daily.   Semaglutide, 2 MG/DOSE, 8 MG/3ML SOPN Inject 2 mg as directed once a week. (Patient taking differently: Inject 2 mg as directed every Tuesday.)     Allergies:   Statins and Cinnamon   Social History   Socioeconomic History   Marital status: Married    Spouse name: [name]   Number of children: 2   Years of education: trade school   Highest education level: Not on file  Occupational History   Occupation: retired  Tobacco Use   Smoking status: Never   Smokeless tobacco: Never  Vaping Use   Vaping Use: Never used  Substance and Sexual Activity   Alcohol use: No   Drug use: No   Sexual activity: Yes    Birth control/protection: Post-menopausal  Other Topics Concern   Not on file  Social History Narrative   01/24/22   From: the area   Living: with [name], husband (1968)   Work: retired - school  bus Librarian, academic      Family: 2 children - [name] and [name] - 4 grandchildren      Enjoys: farm life, play with grandchildren, travel      Exercise: stairs at home 20 times a day, walking the property   Diet: not following diabetic diet but trying to do better      Safety   Seat belts: Yes    Guns: Yes  and secure   Safe in relationships: Yes       Social Determinants of Health   Financial Resource Strain: Low Risk  (02/05/2022)   Overall Financial Resource Strain (CARDIA)    Difficulty of Paying Living Expenses: Not hard at all  Food Insecurity: No Food Insecurity (02/05/2022)   Hunger Vital Sign    Worried About Running Out of Food in the Last Year: Never true    [place] in the Last Year: Never true  Transportation Needs: No Transportation Needs (02/05/2022)   PRAPARE - Hydrologist (Medical): No    Lack of Transportation (Non-Medical): No  Physical Activity: Insufficiently Active (02/05/2022)   Exercise Vital Sign    Days of Exercise per Week: 7 days    Minutes of Exercise per Session: 20 min  Stress: No Stress Concern Present (02/05/2022)   [place]    Feeling of Stress : Not at all  Social Connections: [place] (02/05/2022)   Social Connection and Isolation Panel [NHANES]    Frequency of Communication with Friends and Family: More than three times a week    Frequency of Social Gatherings with Friends and Family: More than three times a week    Attends Religious Services: More than 4 times per year    Active Member of [employer] or Organizations: Yes    Attends Music therapist: More than 4 times per year    Marital Status: Married     Family History: The patient's family history includes Breast cancer in her mother; Colon polyps in her maternal aunt; Heart disease in her maternal grandfather. There is no history  of Kidney cancer, Bladder Cancer, Colon cancer,  Stomach cancer, Rectal cancer, Pancreatic cancer, or Esophageal cancer.  ROS:   Please see the history of present illness.    All other systems reviewed and are negative.  EKGs/Labs/Other Studies Reviewed:    The following studies were reviewed today: Cardiac Studies & Procedures   CARDIAC CATHETERIZATION  CARDIAC CATHETERIZATION 01/08/2023  Narrative   Prox LAD to Mid LAD lesion is 40% stenosed.   There is severe aortic valve stenosis.  1.  Patent coronary arteries with mild diffuse nonobstructive plaquing, mid LAD with 40% stenosis, widely patent left main, left circumflex, and RCA 2.  Severe calcific aortic stenosis with mean transvalvular gradient 61 mmHg and calculated aortic valve area 0.63 cm 3.  Mildly increased right heart diastolic filling pressures with increased pulmonary wedge pressure of 20 mmHg and preserved cardiac output of 4.74 L/min  Recommendations: Medical therapy for mild nonobstructive CAD, continue evaluation for aortic valve replacement.  Findings Coronary Findings Diagnostic  Dominance: Right  Left Main Vessel is angiographically normal.  Left Anterior Descending The vessel exhibits minimal luminal irregularities. Prox LAD to Mid LAD lesion is 40% stenosed.  Ramus Intermedius Vessel is angiographically normal.  Left Circumflex Vessel is angiographically normal.  Right Coronary Artery The vessel exhibits minimal luminal irregularities.  Intervention  No interventions have been documented.   STRESS TESTS  NM MYOCAR MULTI W/SPECT W 05/21/2013  Narrative *RADIOLOGY REPORT*  Clinical Data:  Chest pain, hypertension, hyperlipidemia, history of COPD and obesity  MYOCARDIAL IMAGING WITH SPECT (REST AND EXERCISE) GATED LEFT VENTRICULAR WALL MOTION STUDY LEFT VENTRICULAR EJECTION FRACTION  Technique:  Standard myocardial SPECT imaging was performed after resting intravenous injection of  10 mCi Tc-58m sestamibi. Subsequently, exercise  tolerance test was performed by the patient under the supervision of the Cardiology staff.  At peak-stress, 30 mCi Tc-83m sestamibiwas injected intravenously and standard myocardial SPECT imaging was performed.   Of note, the patient was able to achieve target heart rate (achieved heart rate - 136 beats per minute; target heart rate - 134 beats per minute) and was able to exercise without recurrence of chest pain.  Quantitative gated imaging was also performed to evaluate left ventricular wall motion, and estimate left ventricular ejection fraction.  Comparison:  Chest CT - 05/20/2013  Findings:  Review of the rotational raw images demonstrates mild to moderate breast attenuation.  There is very minimal patient motion on both the rest and stress images.  Mild grossly symmetric GI activity is identified on both the provided rest and stress images.  SPECT imaging demonstrates relative homogeneous distribution of injector radiotracer throughout the left ventricular myocardium. There are no discrete areas of prior infarction or mismatched perfusion to suggest ischemia.  Quantitative gated analysis shows normal wall motion.  The resting left ventricular ejection fraction is 69% with end- diastolic volume of 63 ml and end-systolic volume of 20 ml.  IMPRESSION: 1.  No scintigraphic evidence of prior infarction or exercise- induced ischemia.  Note, the patient was able to achieve target heart rate. 2.  Normal wall motion.  Ejection fraction - 69%.   Original Report Authenticated By: Jake Seats, MD   ECHOCARDIOGRAM  ECHOCARDIOGRAM COMPLETE 12/09/2022  Narrative ECHOCARDIOGRAM REPORT    Patient Name:   Abigail [name] [facility] Date of Exam: 12/09/2022 Medical Rec #:  [id number]     Height:       63.3 in Accession #:    [id number]    Weight:  182.0 lb Date of Birth:  [birth year]      BSA:          1.864 m Patient Age:    23 years      BP:           118/74 mmHg Patient Gender: F              HR:           84 bpm. Exam Location:  [facility]  Procedure: 2D Echo, Cardiac Doppler and Color Doppler  Indications:    R06.09 (ICD-10-CM) - DOE (dyspnea on exertion) R01.1 (ICD-10-CM) - Heart murmur  History:        Patient has prior history of Echocardiogram examinations, most recent 04/30/2021. COPD; Risk Factors:Hypertension, Diabetes and Dyslipidemia.  Sonographer:    Alvino Chapel RCS Referring Phys: [id number] TABITHA DUGAL  IMPRESSIONS   1. Severe asymmetric basal septal hypertrophy measuring 1.9 cm. There is mild SAM of the anterior MV leaflet. There is a dynamic subvalvular gradient, difficult to discern peak based on Dopplers. Peak gradient at least 69mmHg. Marland Kitchen Left ventricular ejection fraction, by estimation, is 65 to 70%. The left ventricle has normal function. The left ventricle has no regional wall motion abnormalities. There is severe asymmetric left ventricular hypertrophy of the basal-septal segment. Left ventricular diastolic parameters are consistent with Grade I diastolic dysfunction (impaired relaxation). Elevated left atrial pressure. 2. Right ventricular systolic function is normal. The right ventricular size is normal. Tricuspid regurgitation signal is inadequate for assessing PA pressure. 3. Left atrial size was severely dilated. 4. The mitral valve is abnormal. No evidence of mitral valve regurgitation. Mild mitral stenosis. 5. The aortic valve was not well visualized. There is moderate calcification of the aortic valve. There is moderate thickening of the aortic valve. Aortic valve regurgitation is not visualized. Severe aortic valve stenosis. Aortic valve mean gradient measures 51.0 mmHg. Aortic valve peak gradient measures 88.6 mmHg. Aortic valve area, by VTI measures 0.73 cm. 04/30/2021 echo reviewed, aortic valve reported as normal however AVA VTI 1.04 and mean gradient 24 mmHg consistent with moderate aortic stenosis at that time. Stenosis has progressed  since 04/30/2021 study.  FINDINGS Left Ventricle: Severe asymmetric basal septal hypertrophy measuring 1.9 cm. There is mild SAM of the anterior MV leaflet. There is a dynamic subvalvular gradient, difficult to discern peak based on Dopplers. Peak gradient at least 73mmHg. Left ventricular ejection fraction, by estimation, is 65 to 70%. The left ventricle has normal function. The left ventricle has no regional wall motion abnormalities. The left ventricular internal cavity size was normal in size. There is severe asymmetric left ventricular hypertrophy of the basal-septal segment. Left ventricular diastolic parameters are consistent with Grade I diastolic dysfunction (impaired relaxation). Elevated left atrial pressure.  Right Ventricle: The right ventricular size is normal. Right vetricular wall thickness was not well visualized. Right ventricular systolic function is normal. Tricuspid regurgitation signal is inadequate for assessing PA pressure.  Left Atrium: Left atrial size was severely dilated.  Right Atrium: Right atrial size was normal in size.  Pericardium: There is no evidence of pericardial effusion.  Mitral Valve: The mitral valve is abnormal. There is mild thickening of the mitral valve leaflet(s). There is mild calcification of the mitral valve leaflet(s). Mild mitral annular calcification. No evidence of mitral valve regurgitation. Mild mitral valve stenosis. MV peak gradient, 11.3 mmHg. The mean mitral valve gradient is 4.0 mmHg.  Tricuspid Valve: The tricuspid valve is normal in structure. Tricuspid valve  regurgitation is not demonstrated. No evidence of tricuspid stenosis.  Aortic Valve: The aortic valve was not well visualized. There is moderate calcification of the aortic valve. There is moderate thickening of the aortic valve. There is moderate aortic valve annular calcification. Aortic valve regurgitation is not visualized. Severe aortic stenosis is present. Aortic valve mean  gradient measures 51.0 mmHg. Aortic valve peak gradient measures 88.6 mmHg. Aortic valve area, by VTI measures 0.73 cm.  Pulmonic Valve: The pulmonic valve was not well visualized. Pulmonic valve regurgitation is not visualized. No evidence of pulmonic stenosis.  Aorta: The aortic root is normal in size and structure.  IAS/Shunts: No atrial level shunt detected by color flow Doppler.   LEFT VENTRICLE PLAX 2D LVIDd:         3.50 cm   Diastology LVIDs:         2.00 cm   LV e' medial:    3.92 cm/s LV PW:         1.50 cm   LV E/e' medial:  26.0 LV IVS:        1.80 cm   LV e' lateral:   4.13 cm/s LVOT diam:     1.70 cm   LV E/e' lateral: 24.7 LV SV:         78 LV SV Index:   42 LVOT Area:     2.27 cm   RIGHT VENTRICLE RV S prime:     12.40 cm/s TAPSE (M-mode): 2.3 cm  LEFT ATRIUM             Index        RIGHT ATRIUM           Index LA diam:        4.20 cm 2.25 cm/m   RA Area:     11.60 cm LA Vol (A2C):   89.8 ml 48.19 ml/m  RA Volume:   26.30 ml  14.11 ml/m LA Vol (A4C):   94.2 ml 50.55 ml/m LA Biplane Vol: 92.8 ml 49.80 ml/m AORTIC VALVE AV Area (Vmax):    1.60 cm AV Area (Vmean):   0.75 cm AV Area (VTI):     0.73 cm AV Vmax:           470.67 cm/s AV Vmean:          321.333 cm/s AV VTI:            1.063 m AV Peak Grad:      88.6 mmHg AV Mean Grad:      51.0 mmHg LVOT Vmax:         332.00 cm/s LVOT Vmean:        106.000 cm/s LVOT VTI:          0.342 m LVOT/AV VTI ratio: 0.32  AORTA Ao Root diam: 3.10 cm  MITRAL VALVE MV Area (PHT): 1.06 cm     SHUNTS MV Area VTI:   1.83 cm     Systemic VTI:  0.34 m MV Peak grad:  11.3 mmHg    Systemic Diam: 1.70 cm MV Mean grad:  4.0 mmHg MV Vmax:       1.68 m/s MV Vmean:      88.1 cm/s MV Decel Time: 718 msec MV E velocity: 102.00 cm/s MV A velocity: 171.00 cm/s MV E/A ratio:  0.60  Carlyle Dolly MD Electronically signed by Carlyle Dolly MD Signature Date/Time: 12/09/2022/12:49:37 PM    Final     CT  SCANS  CT CORONARY MORPH  W/CTA COR W/SCORE 01/13/2023  Addendum 01/13/2023  1:34 PM ADDENDUM REPORT: 01/13/2023 13:31  EXAM: OVER-READ INTERPRETATION  CT CHEST  The following report is an over-read performed by radiologist Abigail. Maudry Diego [facility] on 01/13/2023. This over-read does not include interpretation of cardiac or coronary anatomy or pathology. The cardiac TAVR interpretation by the cardiologist is attached.  COMPARISON:  None.  FINDINGS: Extracardiac findings will be described separately under dictation for contemporaneously obtained CTA chest, abdomen and pelvis.  IMPRESSION: Please see separate dictation for contemporaneously obtained CTA chest, abdomen and pelvis dated 01/13/2023 for full description of relevant extracardiac findings.   Electronically Signed By: Yetta Glassman M.D. On: 01/13/2023 13:31  Narrative CLINICAL DATA:  Aortic Stenosis  EXAM: Cardiac TAVR CT  TECHNIQUE: The patient was scanned on a Siemens Force [id number] slice scanner. A 120 kV retrospective scan was triggered in the ascending thoracic aorta at 140 HU's. Gantry rotation speed was 250 msecs and collimation was .6 mm. No beta blockade or nitro were given. The 3D data set was reconstructed in 5% intervals of the R-R cycle. Systolic and diastolic phases were analyzed on a dedicated work station using MPR, MIP and VRT modes. The patient received 80 cc of contrast.  FINDINGS: Aortic Valve: Calcified tri leaflet AV with score 1941  Aorta: No aneurysm normal arch vessels mild calcific atherosclerosis  Sino-tubular Junction: 24 mm  Ascending Thoracic Aorta: 29 mm  Aortic Arch: 23 mm  Descending Thoracic Aorta: 24 mm  Sinus of Valsalva Measurements:  Non-coronary: 28.07 mm  Right - coronary: 28.05 mm  Height 16.8 mm  Left -   coronary: 28.69 mm  Height 17.9 mm  Coronary Artery Height above Annulus:  Left Main: 12.47 mm above annulus  Right Coronary:  12.96 mm above annulus  Virtual Basal Annulus Measurements: Motion artifact measurements done in 20% phase  Maximum / Minimum Diameter: 26.34 mm x 20.24 mm  Perimeter: 72.45 mm  Area: 384.21 mm 2  Coronary Arteries: Sufficient height above annulus for deployment  Optimum Fluoroscopic Angle for Delivery: RAO 5 Cranial 8 degrees  IMPRESSION: 1. Calcified tri leaflet aortic valve with score 1941  2. Annular area of 384 mm2 suitable for a 23 mm Sapien 3 valve or alternatively a 26 mm Medtronic Evolut valve  3.  Coronary arteries sufficient height above annulus for deployment  4. Optimum angiographic angle for deployment RAO 5 Cranial 8 degrees  5.  Membranous septal length 7.9 mm  Jenkins Rouge  Electronically Signed: By: Jenkins Rouge M.D. On: 01/13/2023 12:34           EKG:  EKG is not ordered today.  The ekg from 01/01/2023 was personally reviewed and shows normal sinus rhythm with no evidence of conduction disease.  There is an age-indeterminate inferior infarct and ST/T wave changes consider lateral ischemia, changes of LVH are noted.  Recent Labs: 11/08/2022: ALT 19 01/01/2023: BUN 15; Creatinine, Ser 0.66; Platelets 189 01/08/2023: Hemoglobin 11.9; Hemoglobin 11.9; Potassium 3.6; Potassium 3.6; Sodium 142; Sodium 142  Recent Lipid Panel    Component Value Date/Time   CHOL 282 (H) 11/08/2022 1233   TRIG 224.0 (H) 11/08/2022 1233   HDL 51.00 11/08/2022 1233   CHOLHDL 6 11/08/2022 1233   VLDL 44.8 (H) 11/08/2022 1233   LDLCALC 168 (H) 07/02/2021 1224   LDLDIRECT 204.0 11/08/2022 1233     Risk Assessment/Calculations:                Physical Exam:  VS:  BP 126/60 (BP Location: Right Arm, Patient Position: Sitting, Cuff Size: Normal)   Pulse 74   Ht 5\' 2"  (1.575 m)   Wt 184 lb (83.5 kg)   SpO2 96%   BMI 33.65 kg/m     Wt Readings from Last 3 Encounters:  01/22/23 184 lb (83.5 kg)  01/14/23 182 lb (82.6 kg)  01/08/23 183 lb (83 kg)     GEN:  Well  nourished, well developed in no acute distress HEENT: Normal NECK: No JVD; No carotid bruits LYMPHATICS: No lymphadenopathy CARDIAC: RRR, 3/6 late peaking crescendo decrescendo murmur is noted at the right upper sternal border RESPIRATORY:  Clear to auscultation without rales, wheezing or rhonchi  ABDOMEN: Soft, non-tender, non-distended MUSCULOSKELETAL:  No edema; No deformity  SKIN: Warm and dry NEUROLOGIC:  Alert and oriented x 3 PSYCHIATRIC:  Normal affect   ASSESSMENT:    1. Nonrheumatic aortic valve stenosis    PLAN:    In order of problems listed above:  The patient has severe, symptomatic aortic stenosis (Stage D1) with NYHA functional class 3 symptoms of chronic diastolic heart failure with exertional dyspnea and fatigue. I have reviewed the natural history of aortic stenosis with the patient and her husband who is present today. We have discussed the limitations of medical therapy and the poor prognosis associated with symptomatic aortic stenosis. We have reviewed potential treatment options, including palliative medical therapy, conventional surgical aortic valve replacement, and transcatheter aortic valve replacement. We discussed treatment options in the context of the patient's specific comorbid medical conditions.  Echo findings outlined above confirm severe aortic stenosis - images are personally reviewed and demonstrate severe calcification and restriction of the aortic valve leaflets with a mean gradient of 51 mmHg. Cardiac cath demonstrated nonobstructive CAD with no flow-limiting coronary lesions. Invasive catheter pullback demonstrated a mean transaortic gradient of 61 mmHg and calculated AVA of 0.6 square cm. CTA studies show an aortic valve Ca score of 1941, annular measurements suitable for a 23 mm Sapien 3 Ultra valve, and suitable iliofemoral vessels for transfemoral access.  As part of her evaluation today, I demonstrated a procedural animation of balloon expandable  TAVR.  We discussed procedural steps.  We reviewed risks, indications, and alternatives to TAVR.  She understands the risk of serious complications such as bleeding, vascular injury, stroke, myocardial infarction, aortic annular injury, aortic dissection, hemopericardium, need for emergent pericardiocentesis, need for emergent surgery, valve embolization, infection, early or late valve deterioration, and death, occur at a low frequency with combined risk of serious periprocedural complication occurring approximately 2 to 3% of cases.  She understands the risk of permanent pacemaker is approximately 6 to 8%.  Through shared decision-making conversation, we contrasted the relative risks and benefits of TAVR versus conventional surgery.  The patient is scheduled to see Abigail. Lavonna Monarch next week as part of a multidisciplinary heart team approach to her care.  If there is agreement that TAVR is appropriate for this patient, my review of her anatomy suggests that it is suitable for transfemoral TAVR with an Edwards Sapien 3 Ultra Resilia valve.  I asked the patient to contact her dentist for evaluation and clearance before TAVR if possible.  I do not think there will be anything prohibitive for her to proceed.     Medication Adjustments/Labs and Tests Ordered: Current medicines are reviewed at length with the patient today.  Concerns regarding medicines are outlined above.  No orders of the defined types were placed in this encounter.  No orders of the defined types were placed in this encounter.   Patient Instructions  Medication Instructions:  Your physician recommends that you continue on your current medications as directed. Please refer to the Current Medication list given to you today.  *If you need a refill on your cardiac medications before your next appointment, please call your pharmacy*  Lab Work: If you have labs (blood work) drawn today and your tests are completely normal, you will receive your  results only by: [place] (if you have MyChart) OR A paper copy in the mail If you have any lab test that is abnormal or we need to change your treatment, we will call you to review the results.  Follow-Up: At [facility], you and your health needs are our priority.  As part of our continuing mission to provide you with exceptional heart care, we have created designated Provider Care Teams.  These Care Teams include your primary Cardiologist (physician) and Advanced Practice Providers (APPs -  Physician Assistants and Nurse Practitioners) who all work together to provide you with the care you need, when you need it.  We recommend signing up for the patient portal called "MyChart".  Sign up information is provided on this After Visit Summary.  MyChart is used to connect with patients for Virtual Visits (Telemedicine).  Patients are able to view lab/test results, encounter notes, upcoming appointments, etc.  Non-urgent messages can be sent to your provider as well.   To learn more about what you can do with MyChart, go to [email or web address].    Your next appointment:   Ander Purpura will be in contact with you to set-up appointments and procedures.  Please schedule an appointment with your dentist.   Signed, Sherren Mocha, MD  01/22/2023 9:42 AM    [place]

## 2023-01-22 NOTE — Progress Notes (Signed)
Pre Surgical Assessment: 5 M Walk Test  51M=16.25ft  5 Meter Walk Test- trial 1: 7.09 seconds 5 Meter Walk Test- trial 2: 6.75 seconds 5 Meter Walk Test- trial 3: 6.02 seconds 5 Meter Walk Test Average: 6.62 seconds

## 2023-01-24 NOTE — Progress Notes (Signed)
Procedure Type: Isolated AVR  PERIOPERATIVE OUTCOME ESTIMATE %  Operative Mortality 2.06%  Morbidity & Mortality 7.17%  Stroke 1.02%  Renal Failure 0.679%  Reoperation 3.06%  Prolonged Ventilation 3.19%  Deep Sternal Wound Infection 0.068%  [facility] Stay (>14 days) 2.75%  [facility] Stay (<6 days)* 47.3%

## 2023-01-26 NOTE — Progress Notes (Unsigned)
[address]       [name] [address]             [id number]           Abigail [name] Medical Record [id number] Date of Birth: 06-21-1951  Abigail Guest, MD Eugenia Pancoast, FNP  Chief Complaint:  DOE and aortic stenosis   History of Present Illness:     Pt is a very pleasant 72 yo female who has known aortic stenosis but was mild and asymptomatic who had not been seen for awhile who was noted to have an increased murmur and repeat echo now with severe AS with an echo gradient of 57mmHg and a cath pull back of 55mmHg. She has normal LV function and noted asymmetric hypertrophy of the septum at 1.9cm with a 73mmHg dynamic stenosis with mild SAM. She had non obstructive CAD at time of cath and with CTA she had femoral access for a sized Sapien valve of 58mm. She has been seen by Dr Burt Knack who has explained the procedure to her and her husband of TAVR and pt now here to be assessed surgically for the her AS. She currently becomes SOB with walking to her mailbox and back but denies syncope, cp or palpitations      Past Medical History:  Diagnosis Date   Allergy    Anxiety    Arthritis    hands, knees   Cancer ([place])    Cataract    surgery to remove -bilateral   COPD (chronic obstructive pulmonary disease) ([place])    Depression    Diabetes mellitus ([place])    a. A1c 6.6 in 05/2013 indicating new dx.   Fatty liver    Female stress incontinence    GERD (gastroesophageal reflux disease)    Heart murmur    no problems   Hemorrhoids    History of kidney stones    surgery to remove   Hyperlipidemia    Hyperplastic colon polyp 06/07/2008   Hypertension    Hypertriglyceridemia    IBS (irritable bowel syndrome)    Lung nodule    a. 81mm left lung nodule by CT 05/2013.   Neuromuscular disorder (HCC)    neuropathy feet   Obesity    Psoriasis    SCCA (squamous cell carcinoma) of skin 01/16/2022   Left Malar Cheek (in situ) (tx p bx)   Skin cancer     Squamous cell carcinoma of skin 01/16/2022   Right Breast (in situ) (curet and 5FU)    Past Surgical History:  Procedure Laterality Date   CATARACT EXTRACTION W/PHACO Left 10/06/2014   Procedure: CATARACT EXTRACTION PHACO AND INTRAOCULAR LENS PLACEMENT LEFT EYE;  Surgeon: Tonny Branch, MD;  Location: [facility];  Service: Ophthalmology;  Laterality: Left;  CDE 7.35   CATARACT EXTRACTION W/PHACO Right 11/14/2014   Procedure: CATARACT EXTRACTION PHACO AND INTRAOCULAR LENS PLACEMENT RIGHT EYE;  Surgeon: Tonny Branch, MD;  Location: [facility];  Service: Ophthalmology;  Laterality: Right;  CDE:6.39   COLONOSCOPY  06/2008   hx polyps   CYSTOSCOPY W/ RETROGRADES Right 01/21/2017   Procedure: CYSTOSCOPY WITH RETROGRADE PYELOGRAM;  Surgeon: Hollice Espy, MD;  Location: [facility];  Service: Urology;  Laterality: Right;   CYSTOSCOPY/URETEROSCOPY/HOLMIUM LASER/STENT PLACEMENT Left 01/21/2017   Procedure: CYSTOSCOPY/URETEROSCOPY/HOLMIUM LASER/STENT PLACEMENT;  Surgeon: Hollice Espy, MD;  Location: [facility];  Service: Urology;  Laterality: Left;   DORSAL COMPARTMENT RELEASE Left 06/15/2015   Procedure: LEFT WRIST DEQUERVAINS;  Surgeon: Ninetta Lights, MD;  Location: [facility];  Service: Orthopedics;  Laterality: Left;   HERNIA REPAIR     MOUTH SURGERY     tooth ext   NASAL SINUS SURGERY     RIGHT/LEFT HEART CATH AND CORONARY ANGIOGRAPHY N/A 01/08/2023   Procedure: RIGHT/LEFT HEART CATH AND CORONARY ANGIOGRAPHY;  Surgeon: Sherren Mocha, MD;  Location: [facility];  Service: Cardiovascular;  Laterality: N/A;   SHOULDER SURGERY     right   TONSILLECTOMY     UPPER GI ENDOSCOPY     normal per patient   VENTRAL HERNIA REPAIR      Social History   Tobacco Use  Smoking Status Never  Smokeless Tobacco Never    Social History   Substance and Sexual Activity  Alcohol Use No    Social History   Socioeconomic History   Marital status: Married    Spouse name: [name]   Number of  children: 2   Years of education: trade school   Highest education level: Not on file  Occupational History   Occupation: retired  Tobacco Use   Smoking status: Never   Smokeless tobacco: Never  Vaping Use   Vaping Use: Never used  Substance and Sexual Activity   Alcohol use: No   Drug use: No   Sexual activity: Yes    Birth control/protection: Post-menopausal  Other Topics Concern   Not on file  Social History Narrative   01/24/22   From: the area   Living: with [name], husband (1968)   Work: retired - school bus Librarian, academic      Family: 2 children - [name] and [name] - 4 grandchildren      Enjoys: farm life, play with grandchildren, travel      Exercise: stairs at home 20 times a day, walking the property   Diet: not following diabetic diet but trying to do better      Safety   Seat belts: Yes    Guns: Yes  and secure   Safe in relationships: Yes       Social Determinants of Health   Financial Resource Strain: Low Risk  (02/05/2022)   Overall Financial Resource Strain (CARDIA)    Difficulty of Paying Living Expenses: Not hard at all  Food Insecurity: No Food Insecurity (02/05/2022)   Hunger Vital Sign    Worried About Running Out of Food in the Last Year: Never true    [place] in the Last Year: Never true  Transportation Needs: No Transportation Needs (02/05/2022)   PRAPARE - Hydrologist (Medical): No    Lack of Transportation (Non-Medical): No  Physical Activity: Insufficiently Active (02/05/2022)   Exercise Vital Sign    Days of Exercise per Week: 7 days    Minutes of Exercise per Session: 20 min  Stress: No Stress Concern Present (02/05/2022)   [place]    Feeling of Stress : Not at all  Social Connections: [place] (02/05/2022)   Social Connection and Isolation Panel [NHANES]    Frequency of Communication with Friends and Family: More than three times a  week    Frequency of Social Gatherings with Friends and Family: More than three times a week    Attends Religious Services: More than 4 times per year    Active Member of [employer] or Organizations: Yes    Attends Archivist Meetings: More than  4 times per year    Marital Status: Married  Human resources officer Violence: Not At Risk (02/05/2022)   Humiliation, Afraid, Rape, and Kick questionnaire    Fear of Current or Ex-Partner: No    Emotionally Abused: No    Physically Abused: No    Sexually Abused: No    Allergies  Allergen Reactions   Statins Other (See Comments)    Failed simvastatin, pravastatin, and rosuvastatin 5 mg weekly (myalgias)   Cinnamon     Dry throat     Current Outpatient Medications  Medication Sig Dispense Refill   albuterol (VENTOLIN HFA) 108 (90 Base) MCG/ACT inhaler Inhale 2 puffs into the lungs every 4 (four) hours as needed for wheezing or shortness of breath. 1 each 1   Azelastine HCl 137 MCG/SPRAY SOLN Place 1 spray into both nostrils daily as needed (allergies/sinus issues.).     clobetasol (TEMOVATE) 0.05 % external solution Apply 1 application topically 2 (two) times daily. (Patient taking differently: Apply 1 application  topically 2 (two) times daily as needed (psoriasis).) 50 mL 2   COSENTYX SENSOREADY, 300 MG, 150 MG/ML SOAJ Inject 300 mg into the skin every 30 (thirty) days.     diclofenac (FLECTOR) 1.3 % PTCH Place 1 patch onto the skin 2 (two) times daily. (Patient taking differently: Place 1 patch onto the skin at bedtime as needed (neuropathy pain.).) 60 patch 6   Dietary Management Product (NEOKE BHB) POWD Take 1 Dose by mouth.     DULoxetine (CYMBALTA) 30 MG capsule TAKE 1 CAPSULE(30 MG) BY MOUTH DAILY (Patient taking differently: Take 30 mg by mouth at bedtime.) 90 capsule 2   esomeprazole (NEXIUM) 20 MG capsule Take 20 mg by mouth in the morning and at bedtime.     Evolocumab (REPATHA SURECLICK) [id number] MG/ML SOAJ Inject 140 mg [place] every two weeks  2 mL 5   furosemide (LASIX) 20 MG tablet Take 1 tablet (20 mg total) by mouth daily as needed. (Patient taking differently: Take 20 mg by mouth daily.) 30 tablet 3   gabapentin (NEURONTIN) 600 MG tablet TAKE 2 TABLETS BY MOUTH IN THE MORNING AND AT NIGHT 360 tablet 2   glucose blood (FREESTYLE LITE) test strip Use as instructed to monitor FSBS 1x daily. Dx: E11.9 100 each 1   glucose monitoring kit (FREESTYLE) monitoring kit 1 each by Does not apply route as needed for other. Dispense one glucometer of choice, testing strips/lancets for once per day glucose checks.  QS for 1 month, 11 refills. 1 each 0   Lancets (FREESTYLE) lancets Use as instructed 100 each 12   lisinopril (ZESTRIL) 10 MG tablet Take 10 mg by mouth every evening.     metoprolol succinate (TOPROL XL) 25 MG 24 hr tablet Take 1 tablet (25 mg total) by mouth daily. 90 tablet 3   Semaglutide, 2 MG/DOSE, 8 MG/3ML SOPN Inject 2 mg as directed once a week. (Patient taking differently: Inject 2 mg as directed every Tuesday.) 3 mL 1   No current facility-administered medications for this visit.     Family History  Problem Relation Age of Onset   Breast cancer Mother    Colon polyps Maternal Aunt    Heart disease Maternal Grandfather    Kidney cancer Neg Hx    Bladder Cancer Neg Hx    Colon cancer Neg Hx    Stomach cancer Neg Hx    Rectal cancer Neg Hx    Pancreatic cancer Neg Hx    Esophageal  cancer Neg Hx        Physical Exam: Lungs: clear Card: RR with harsh 4/6 sem Ext: no edema Teeth in good repair Neuro: no focal deficits     Diagnostic Studies & Laboratory data: I have personally reviewed the following studies and agree with the findings   TTE (12/2022) IMPRESSIONS   1. Severe asymmetric basal septal hypertrophy measuring 1.9 cm. There is  mild SAM of the anterior MV leaflet. There is a dynamic subvalvular  gradient, difficult to discern peak based on Dopplers. Peak gradient at  least 85mmHg. [place] Left  ventricular  ejection fraction, by estimation, is 65 to 70%. The left ventricle has  normal function. The left ventricle has no regional wall motion  abnormalities. There is severe asymmetric left ventricular hypertrophy of  the basal-septal segment. Left ventricular  diastolic parameters are consistent with Grade I diastolic dysfunction  (impaired relaxation). Elevated left atrial pressure.   2. Right ventricular systolic function is normal. The right ventricular  size is normal. Tricuspid regurgitation signal is inadequate for assessing  PA pressure.   3. Left atrial size was severely dilated.   4. The mitral valve is abnormal. No evidence of mitral valve  regurgitation. Mild mitral stenosis.   5. The aortic valve was not well visualized. There is moderate  calcification of the aortic valve. There is moderate thickening of the  aortic valve. Aortic valve regurgitation is not visualized. Severe aortic  valve stenosis. Aortic valve mean gradient  measures 51.0 mmHg. Aortic valve peak gradient measures 88.6 mmHg. Aortic  valve area, by VTI measures 0.73 cm. 04/30/2021 echo reviewed, aortic  valve reported as normal however AVA VTI 1.04 and mean gradient 24 mmHg  consistent with moderate aortic  stenosis at that time. Stenosis has progressed since 04/30/2021 study.   FINDINGS   Left Ventricle: Severe asymmetric basal septal hypertrophy measuring 1.9  cm. There is mild SAM of the anterior MV leaflet. There is a dynamic  subvalvular gradient, difficult to discern peak based on Dopplers. Peak  gradient at least 100mmHg. Left  ventricular ejection fraction, by estimation, is 65 to 70%. The left  ventricle has normal function. The left ventricle has no regional wall  motion abnormalities. The left ventricular internal cavity size was normal  in size. There is severe asymmetric  left ventricular hypertrophy of the basal-septal segment. Left ventricular  diastolic parameters are consistent with  Grade I diastolic dysfunction  (impaired relaxation). Elevated left atrial pressure.   Right Ventricle: The right ventricular size is normal. Right vetricular  wall thickness was not well visualized. Right ventricular systolic  function is normal. Tricuspid regurgitation signal is inadequate for  assessing PA pressure.   Left Atrium: Left atrial size was severely dilated.   Right Atrium: Right atrial size was normal in size.   Pericardium: There is no evidence of pericardial effusion.   Mitral Valve: The mitral valve is abnormal. There is mild thickening of  the mitral valve leaflet(s). There is mild calcification of the mitral  valve leaflet(s). Mild mitral annular calcification. No evidence of mitral  valve regurgitation. Mild mitral  valve stenosis. MV peak gradient, 11.3 mmHg. The mean mitral valve  gradient is 4.0 mmHg.   Tricuspid Valve: The tricuspid valve is normal in structure. Tricuspid  valve regurgitation is not demonstrated. No evidence of tricuspid  stenosis.   Aortic Valve: The aortic valve was not well visualized. There is moderate  calcification of the aortic valve. There is moderate thickening of  the  aortic valve. There is moderate aortic valve annular calcification. Aortic  valve regurgitation is not  visualized. Severe aortic stenosis is present. Aortic valve mean gradient  measures 51.0 mmHg. Aortic valve peak gradient measures 88.6 mmHg. Aortic  valve area, by VTI measures 0.73 cm.   Pulmonic Valve: The pulmonic valve was not well visualized. Pulmonic valve  regurgitation is not visualized. No evidence of pulmonic stenosis.   Aorta: The aortic root is normal in size and structure.   IAS/Shunts: No atrial level shunt detected by color flow Doppler.     LEFT VENTRICLE  PLAX 2D  LVIDd:         3.50 cm   Diastology  LVIDs:         2.00 cm   LV e' medial:    3.92 cm/s  LV PW:         1.50 cm   LV E/e' medial:  26.0  LV IVS:        1.80 cm   LV e'  lateral:   4.13 cm/s  LVOT diam:     1.70 cm   LV E/e' lateral: 24.7  LV SV:         78  LV SV Index:   42  LVOT Area:     2.27 cm     RIGHT VENTRICLE  RV S prime:     12.40 cm/s  TAPSE (M-mode): 2.3 cm   LEFT ATRIUM             Index        RIGHT ATRIUM           Index  LA diam:        4.20 cm 2.25 cm/m   RA Area:     11.60 cm  LA Vol (A2C):   89.8 ml 48.19 ml/m  RA Volume:   26.30 ml  14.11 ml/m  LA Vol (A4C):   94.2 ml 50.55 ml/m  LA Biplane Vol: 92.8 ml 49.80 ml/m   AORTIC VALVE  AV Area (Vmax):    1.60 cm  AV Area (Vmean):   0.75 cm  AV Area (VTI):     0.73 cm  AV Vmax:           470.67 cm/s  AV Vmean:          321.333 cm/s  AV VTI:            1.063 m  AV Peak Grad:      88.6 mmHg  AV Mean Grad:      51.0 mmHg  LVOT Vmax:         332.00 cm/s  LVOT Vmean:        106.000 cm/s  LVOT VTI:          0.342 m  LVOT/AV VTI ratio: 0.32    AORTA  Ao Root diam: 3.10 cm   MITRAL VALVE  MV Area (PHT): 1.06 cm     SHUNTS  MV Area VTI:   1.83 cm     Systemic VTI:  0.34 m  MV Peak grad:  11.3 mmHg    Systemic Diam: 1.70 cm  MV Mean grad:  4.0 mmHg  MV Vmax:       1.68 m/s  MV Vmean:      88.1 cm/s  MV Decel Time: 718 msec  MV E velocity: 102.00 cm/s  MV A velocity: 171.00 cm/s  MV E/A ratio:  0.60   CATH (01/2023)  Conclusion   Prox LAD to Mid LAD lesion is 40% stenosed.   There is severe aortic valve stenosis.   1.  Patent coronary arteries with mild diffuse nonobstructive plaquing, mid LAD with 40% stenosis, widely patent left main, left circumflex, and RCA 2.  Severe calcific aortic stenosis with mean transvalvular gradient 61 mmHg and calculated aortic valve area 0.63 cm 3.  Mildly increased right heart diastolic filling pressures with increased pulmonary wedge pressure of 20 mmHg and preserved cardiac output of 4.74 L/min     Recent Radiology Findings:   CTA (01/2023) FINDINGS: Aortic Valve: Calcified tri leaflet AV with score 1941   Aorta: No aneurysm  normal arch vessels mild calcific atherosclerosis   Sino-tubular Junction: 24 mm   Ascending Thoracic Aorta: 29 mm   Aortic Arch: 23 mm   Descending Thoracic Aorta: 24 mm   Sinus of Valsalva Measurements:   Non-coronary: 28.07 mm   Right - coronary: 28.05 mm  Height 16.8 mm   Left -   coronary: 28.69 mm  Height 17.9 mm   Coronary Artery Height above Annulus:   Left Main: 12.47 mm above annulus   Right Coronary: 12.96 mm above annulus   Virtual Basal Annulus Measurements: Motion artifact measurements done in 20% phase   Maximum / Minimum Diameter: 26.34 mm x 20.24 mm   Perimeter: 72.45 mm   Area: 384.21 mm 2   Coronary Arteries: Sufficient height above annulus for deployment   Optimum Fluoroscopic Angle for Delivery: RAO 5 Cranial 8 degrees   IMPRESSION: 1. Calcified tri leaflet aortic valve with score 1941   2. Annular area of 384 mm2 suitable for a 23 mm Sapien 3 valve or alternatively a 26 mm Medtronic Evolut valve   3.  Coronary arteries sufficient height above annulus for deployment   4. Optimum angiographic angle for deployment RAO 5 Cranial 8 degrees   5.  Membranous septal length 7.9 mm    Recent Lab Findings: Lab Results  Component Value Date   WBC 7.6 01/01/2023   HGB 11.9 (L) 01/08/2023   HGB 11.9 (L) 01/08/2023   HCT 35.0 (L) 01/08/2023   HCT 35.0 (L) 01/08/2023   PLT 189 01/01/2023   GLUCOSE 92 01/01/2023   CHOL 282 (H) 11/08/2022   TRIG 224.0 (H) 11/08/2022   HDL 51.00 11/08/2022   LDLDIRECT 204.0 11/08/2022   LDLCALC 168 (H) 07/02/2021   ALT 19 11/08/2022   AST 20 11/08/2022   NA 142 01/08/2023   NA 142 01/08/2023   K 3.6 01/08/2023   K 3.6 01/08/2023   CL 106 01/01/2023   CREATININE 0.66 01/01/2023   BUN 15 01/01/2023   CO2 25 01/01/2023   TSH 0.91 04/02/2017   HGBA1C 6.0 (A) 11/08/2022      Assessment / Plan:     Pt is a 72 yo with NYHA class 2 symptoms of severe AS with a hypertophic septum and normal LV function and  nonobstructive CAD. Pt should proceed with AVR for her symptomatic AS and with her age we discussed the option for TAVR and after all the risks and benefits were discussed, she wishes to proceed with TAVR. She understands her risks of arterial injury, need for PPM and CVA and if aortic injury occurs she would be a bail out for surgical intervention. I agree with femoral access (right) with a size 23 sapien valve   I have spent 60 min in review of the records, viewing studies and in face to face  with patient and in coordination of future care    Coralie Common 01/26/2023 9:07 AM

## 2023-01-27 ENCOUNTER — Other Ambulatory Visit: Payer: Self-pay

## 2023-01-27 ENCOUNTER — Institutional Professional Consult (permissible substitution) (INDEPENDENT_AMBULATORY_CARE_PROVIDER_SITE_OTHER): Payer: Medicare Other | Admitting: Thoracic Surgery (Cardiothoracic Vascular Surgery)

## 2023-01-27 ENCOUNTER — Encounter: Payer: Self-pay | Admitting: Thoracic Surgery (Cardiothoracic Vascular Surgery)

## 2023-01-27 VITALS — BP 117/71 | HR 88 | Resp 20 | Ht 62.0 in | Wt 180.0 lb

## 2023-01-27 DIAGNOSIS — I350 Nonrheumatic aortic (valve) stenosis: Secondary | ICD-10-CM

## 2023-01-27 NOTE — Patient Instructions (Signed)
TAVR in a few weeks

## 2023-01-30 ENCOUNTER — Telehealth: Payer: Self-pay | Admitting: Pharmacist

## 2023-01-30 ENCOUNTER — Telehealth: Payer: Self-pay | Admitting: Internal Medicine

## 2023-01-30 ENCOUNTER — Ambulatory Visit
Admission: RE | Admit: 2023-01-30 | Discharge: 2023-01-30 | Disposition: A | Discharge status: Home / Self Care | Payer: Medicare Other | Source: Ambulatory Visit | Attending: Nurse Practitioner | Admitting: Nurse Practitioner

## 2023-01-30 VITALS — BP 127/76 | HR 79 | Temp 98.9°F | Resp 18

## 2023-01-30 DIAGNOSIS — G5791 Unspecified mononeuropathy of right lower limb: Secondary | ICD-10-CM

## 2023-01-30 MED ORDER — CAPSAICIN 0.025 % EX LOTN: 1.0000 | TOPICAL_LOTION | Freq: Four times a day (QID) | CUTANEOUS | 0 refills | Status: DC | PRN

## 2023-01-30 NOTE — Telephone Encounter (Signed)
Appreciate your assessment and glad to hear she has an UC appt.

## 2023-01-30 NOTE — ED Triage Notes (Signed)
Itch in left  great toe since Tuesday.  States ice is the only thing that helps.  States she has neuropathy in that foot.  States she has heart surgery schedule on Wednesday and states she can only take certain medications.

## 2023-01-30 NOTE — ED Provider Notes (Signed)
[facility]    CSN: [id number] Arrival date & time: 01/30/23  1300      History   Chief Complaint Chief Complaint  Patient presents with   Foot Pain    Entered by patient    HPI Abigail [name] is a 72 y.o. female.   The history is provided by the patient.   The patient presents for complaints of itching to the right great toe and second toe that been present for the past several days.  Patient reports she has a history of neuropathy.  She states that she has had this in the past, and her podiatrist has given her injections of what she believes to be steroids in the right foot.  She states that she does take gabapentin daily for neuropathy.  She denies any known injury or trauma, swelling, pain, or inability to bear weight.  Patient states that she has been using ice, which appears to be the only thing that helps with itching in her toes.  She states that the itching has gotten worse over the last 24 hours.  She is not taking any medication for her symptoms as she states that she is scheduled for heart surgery next week, and is unsure what she should take or not take.  Patient does have a handout from her physician as to how she should prep for her surgery.  Included in this handout, it is indicated that patient should avoid NSAIDs such as ibuprofen, naproxen, Advil, BC powder, prior to her surgery.  Past Medical History:  Diagnosis Date   Allergy    Anxiety    Arthritis    hands, knees   Cancer ([place])    Cataract    surgery to remove -bilateral   COPD (chronic obstructive pulmonary disease) ([place])    Depression    Diabetes mellitus ([place])    a. A1c 6.6 in 05/2013 indicating new dx.   Fatty liver    Female stress incontinence    GERD (gastroesophageal reflux disease)    Heart murmur    no problems   Hemorrhoids    History of kidney stones    surgery to remove   Hyperlipidemia    Hyperplastic colon polyp 06/07/2008   Hypertension    Hypertriglyceridemia    IBS  (irritable bowel syndrome)    Lung nodule    a. 61mm left lung nodule by CT 05/2013.   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   Heart murmur 11/11/2022   Psoriasis 11/11/2022   Skin lesion of left ear 11/11/2022   DOE (dyspnea on exertion) 11/11/2022   Pulmonary nodule less than 6 mm determined by computed tomography of lung 11/11/2022   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   Statin intolerance 11/11/2022   Adrenal adenoma, left 11/08/2022   Hepatic steatosis 11/08/2022   Recurrent pansinusitis 11/08/2022   Acute non-recurrent frontal sinusitis [id number]   Eosinophilic esophagitis [id number]   Primary osteoarthritis of both first carpometacarpal joints 05/17/2020   Dysphagia 09/15/2019  Bronchiectasis without complication ([place]) [id number]   Type 2 diabetes mellitus with neurological complications ([place]) [id number]   Diabetic neuropathy ([place]) 04/02/2017   COPD (chronic obstructive pulmonary disease) ([place]) 04/02/2017   Aortic valve sclerosis 04/02/2017   Pulmonary nodule 05/21/2013   IRRITABLE BOWEL SYNDROME 05/18/2008   Hypercholesterolemia with LDL greater than 190 mg/dL 05/17/2008   Essential hypertension 05/17/2008   HEMORRHOIDS 05/17/2008   GERD 05/17/2008   FATTY LIVER DISEASE 05/17/2008   Osteoarthritis 05/17/2008   STRESS INCONTINENCE 05/17/2008    Past Surgical History:  Procedure Laterality Date   CATARACT EXTRACTION W/PHACO Left 10/06/2014   Procedure: CATARACT EXTRACTION PHACO AND INTRAOCULAR LENS PLACEMENT LEFT EYE;  Surgeon: Tonny Branch, MD;   Location: [facility];  Service: Ophthalmology;  Laterality: Left;  CDE 7.35   CATARACT EXTRACTION W/PHACO Right 11/14/2014   Procedure: CATARACT EXTRACTION PHACO AND INTRAOCULAR LENS PLACEMENT RIGHT EYE;  Surgeon: Tonny Branch, MD;  Location: [facility];  Service: Ophthalmology;  Laterality: Right;  CDE:6.39   COLONOSCOPY  06/2008   hx polyps   CYSTOSCOPY W/ RETROGRADES Right 01/21/2017   Procedure: CYSTOSCOPY WITH RETROGRADE PYELOGRAM;  Surgeon: Hollice Espy, MD;  Location: [facility];  Service: Urology;  Laterality: Right;   CYSTOSCOPY/URETEROSCOPY/HOLMIUM LASER/STENT PLACEMENT Left 01/21/2017   Procedure: CYSTOSCOPY/URETEROSCOPY/HOLMIUM LASER/STENT PLACEMENT;  Surgeon: Hollice Espy, MD;  Location: [facility];  Service: Urology;  Laterality: Left;   DORSAL COMPARTMENT RELEASE Left 06/15/2015   Procedure: LEFT WRIST DEQUERVAINS;  Surgeon: Ninetta Lights, MD;  Location: [facility];  Service: Orthopedics;  Laterality: Left;   HERNIA REPAIR     MOUTH SURGERY     tooth ext   NASAL SINUS SURGERY     RIGHT/LEFT HEART CATH AND CORONARY ANGIOGRAPHY N/A 01/08/2023   Procedure: RIGHT/LEFT HEART CATH AND CORONARY ANGIOGRAPHY;  Surgeon: Sherren Mocha, MD;  Location: [facility];  Service: Cardiovascular;  Laterality: N/A;   SHOULDER SURGERY     right   TONSILLECTOMY     UPPER GI ENDOSCOPY     normal per patient   VENTRAL HERNIA REPAIR      OB History   No obstetric history on file.      Home Medications    Prior to Admission medications   Medication Sig Start Date End Date Taking? Authorizing Provider  Capsaicin 0.025 % LOTN Apply 1 Application topically 4 (four) times daily as needed. Apply to the affected area 4 times daily as needed for itching. 01/30/23  Yes Evalina Tabak-Warren, Alda Lea, NP  albuterol (VENTOLIN HFA) 108 (90 Base) MCG/ACT inhaler Inhale 2 puffs into the lungs every 4 (four) hours as needed for wheezing or shortness of breath. 06/01/21   Eulogio Bear, NP   Azelastine HCl 137 MCG/SPRAY SOLN Place 1 spray into both nostrils daily as needed (allergies/sinus issues.). 07/27/21   [provider]  clobetasol (TEMOVATE) 0.05 % external solution Apply 1 application topically 2 (two) times daily. Patient taking differently: Apply 1 application  topically 2 (two) times daily as needed (psoriasis). 09/15/19   Ringwood, Modena Nunnery, MD  COSENTYX SENSOREADY, 300 MG, 150 MG/ML SOAJ Inject 300 mg into the skin every 30 (thirty) days. 12/10/22   [provider]  diclofenac (FLECTOR) 1.3 % PTCH Place 1 patch onto the skin 2 (two) times daily. Patient taking differently: Place 1 patch onto the skin at bedtime as needed (neuropathy pain.). 06/12/22   Hyatt, Max T, DPM  Dietary Management Product (NEOKE BHB) POWD Take 1 Dose by mouth.  [provider]  DULoxetine (CYMBALTA) 30 MG capsule TAKE 1 CAPSULE(30 MG) BY MOUTH DAILY Patient taking differently: Take 30 mg by mouth at bedtime. 11/13/22   Hyatt, Max T, DPM  esomeprazole (NEXIUM) 20 MG capsule Take 20 mg by mouth in the morning and at bedtime.    [provider]  Evolocumab (REPATHA SURECLICK) [id number] MG/ML SOAJ Inject 140 mg [place] every two weeks 12/13/22   Eugenia Pancoast, FNP  furosemide (LASIX) 20 MG tablet Take 1 tablet (20 mg total) by mouth daily as needed. Patient not taking: Reported on 01/27/2023 01/01/23 12/27/23  Mallipeddi, Vishnu P, MD  gabapentin (NEURONTIN) 600 MG tablet TAKE 2 TABLETS BY MOUTH IN THE MORNING AND AT NIGHT 10/08/22   Hyatt, Max T, DPM  glucose blood (FREESTYLE LITE) test strip Use as instructed to monitor FSBS 1x daily. Dx: E11.9 11/19/22   Eugenia Pancoast, FNP  glucose monitoring kit (FREESTYLE) monitoring kit 1 each by Does not apply route as needed for other. Dispense one glucometer of choice, testing strips/lancets for once per day glucose checks.  QS for 1 month, 11 refills. 05/28/13   Wendie Agreste, MD  Lancets (FREESTYLE) lancets Use as instructed 07/09/19    Alycia Rossetti, MD  lisinopril (ZESTRIL) 10 MG tablet Take 10 mg by mouth every evening.    [provider]  metoprolol succinate (TOPROL XL) 25 MG 24 hr tablet Take 1 tablet (25 mg total) by mouth daily. 01/01/23   Mallipeddi, Vishnu P, MD  Semaglutide, 2 MG/DOSE, 8 MG/3ML SOPN Inject 2 mg as directed once a week. Patient taking differently: Inject 2 mg as directed every Tuesday. 08/02/22   Waunita Schooner, MD    Family History Family History  Problem Relation Age of Onset   Breast cancer Mother    Colon polyps Maternal Aunt    Heart disease Maternal Grandfather    Kidney cancer Neg Hx    Bladder Cancer Neg Hx    Colon cancer Neg Hx    Stomach cancer Neg Hx    Rectal cancer Neg Hx    Pancreatic cancer Neg Hx    Esophageal cancer Neg Hx     Social History Social History   Tobacco Use   Smoking status: Never   Smokeless tobacco: Never  Vaping Use   Vaping Use: Never used  Substance Use Topics   Alcohol use: No   Drug use: No     Allergies   Statins and Cinnamon   Review of Systems Review of Systems Per HPI  Physical Exam Triage Vital Signs ED Triage Vitals [01/30/23 1305]  Enc Vitals Group     BP 127/76     Pulse Rate 79     Resp 18     Temp 98.9 F (37.2 C)     Temp Source Oral     SpO2 94 %     Weight      Height      Head Circumference      Peak Flow      Pain Score 0     Pain Loc      Pain Edu?      Excl. in [place]?    No data found.  Updated Vital Signs BP 127/76 (BP Location: Right Arm)   Pulse 79   Temp 98.9 F (37.2 C) (Oral)   Resp 18   SpO2 94%   Visual Acuity Right Eye Distance:   Left Eye Distance:   Bilateral Distance:  Right Eye Near:   Left Eye Near:    Bilateral Near:     Physical Exam Vitals and nursing note reviewed.  Constitutional:      Appearance: Normal appearance.  Eyes:     Extraocular Movements: Extraocular movements intact.     Pupils: Pupils are equal, round, and reactive to light.   Cardiovascular:     Rate and Rhythm: Normal rate and regular rhythm.     Pulses: Normal pulses.     Heart sounds: Normal heart sounds.  Pulmonary:     Effort: Pulmonary effort is normal. No respiratory distress.     Breath sounds: Normal breath sounds. No stridor. No wheezing, rhonchi or rales.  Abdominal:     General: Bowel sounds are normal.  Musculoskeletal:     Cervical back: Normal range of motion.     Right foot: Normal range of motion and normal capillary refill. No swelling, deformity or tenderness. Normal pulse.  Feet:     Right foot:     Skin integrity: Skin integrity normal. No skin breakdown, erythema or warmth.     Toenail Condition: Right toenails are normal.  Skin:    General: Skin is warm and dry.  Neurological:     General: No focal deficit present.     Mental Status: She is alert and oriented to person, place, and time.  Psychiatric:        Mood and Affect: Mood normal.        Behavior: Behavior normal.      UC Treatments / Results  Labs (all labs ordered are listed, but only abnormal results are displayed) Labs Reviewed - No data to display  EKG   Radiology No results found.  Procedures Procedures (including critical care time)  Medications Ordered in UC Medications - No data to display  Initial Impression / Assessment and Plan / UC Course  I have reviewed the triage vital signs and the nursing notes.  Pertinent labs & imaging results that were available during my care of the patient were reviewed by me and considered in my medical decision making (see chart for details).  The patient is well-appearing, she is in no acute distress, vital signs are stable.  Patient appears to have a recurrence of neuropathic itching.  Will start patient on capsaicin 0.025% lotion to apply 4 times daily as needed for itching.  Patient advised to use over-the-counter Zyrtec if needed.  Supportive care recommendations were provided to the patient to include  continuing the use of ice as needed.  Patient advised to follow-up with her podiatrist if symptoms fail to improve.  Patient is in agreement with this plan of care and verbalizes understanding.  All questions were answered.  Patient stable for discharge.  Final Clinical Impressions(s) / UC Diagnoses   Final diagnoses:  Neuropathy of right foot     Discharge Instructions      Apply medication as prescribed to the right great toe and second toe as needed May continue use of ice to help with itching, pain, or swelling. Recommend over-the-counter Zyrtec daily as needed to help with itching. May continue using over-the-counter Benadryl cream or lotion to help with itching. If symptoms fail to improve, please follow-up with your podiatrist. Follow-up as needed.     ED Prescriptions     Medication Sig Dispense Auth. Provider   Capsaicin 0.025 % LOTN Apply 1 Application topically 4 (four) times daily as needed. Apply to the affected area 4 times daily as needed  for itching. 113.2 mL Kynzlie Hilleary-Warren, Alda Lea, NP      PDMP not reviewed this encounter.   Tish Men, NP 01/30/23 1334

## 2023-01-30 NOTE — Telephone Encounter (Signed)
Routed to [place]

## 2023-01-30 NOTE — Telephone Encounter (Signed)
Pt notified of Dr. Marsa Aris response. She will reach out to her PCP.

## 2023-01-30 NOTE — Telephone Encounter (Signed)
please see note from [facility] pharmacist; I spoke with pt pt said right now not in a lot of pain and not itching due to putting ice on foot.pts foot dr is out of office for 2 wks; pt said usually he gives her a shot in foot and that helps. Not sure what type shot it is but pt thinks might be prednisone. Now pt has swelling, redness and pain in lt big toe. No available appts at [facility] or [facility]. Pt is scheduled 01/30/23 at 1:30 pm to be seen at [facility]. (Pts husband who has gout told pt that her toe looked like gout.) sending note to Red Christians FNP and [place] pool.

## 2023-01-30 NOTE — Discharge Instructions (Addendum)
Apply medication as prescribed to the right great toe and second toe as needed May continue use of ice to help with itching, pain, or swelling. Recommend over-the-counter Zyrtec daily as needed to help with itching. May continue using over-the-counter Benadryl cream or lotion to help with itching. If symptoms fail to improve, please follow-up with your podiatrist. Follow-up as needed.

## 2023-01-30 NOTE — Telephone Encounter (Signed)
Pt called and states that she is having a Neuropathy itch for the last few days. Pt states that her foot doctor is out of the office and they are unable to see her. Pt is concerned about what medicines she can have before her procedure on next week. Encouraged pt to reach out to her PCP. Please advise.

## 2023-01-30 NOTE — Telephone Encounter (Signed)
Pt states that she has what they call a Neuropathy itch that has been keeping her up for the last few nights. She says that her foot doctor is out and would like to know what she may be able to take being that she has upcoming procedure next week.please advise

## 2023-01-30 NOTE — Telephone Encounter (Signed)
Please triage patient.

## 2023-01-30 NOTE — Telephone Encounter (Signed)
-----   Message from [place], [place] sent at 01/30/2023 11:19 AM EDT ----- Regarding: aggravating neuropathy feet Patient calls informing of increased numbness, burning, itching, both feet. Terrible last three days and nights. Patient will undergo heart valve surgery wed, 02/05/23. Currently on 1200 mg gabapentin BID and 30mg  cymbalta daily. Patient has used ice and rubbed ointments on feet. She is in misery please call with any other suggestions.Correct pharmacy in chart, The patient has reached out to podiatry, MD not in for the week and no one else can see her, and she called cardiology who recommended she get in tough with PCP.  [name], PharmD notified  Avel Sensor, [place] Assistant [phone]

## 2023-02-03 ENCOUNTER — Encounter (HOSPITAL_COMMUNITY)
Admission: RE | Admit: 2023-02-03 | Discharge: 2023-02-03 | Disposition: A | Discharge status: Home / Self Care | Payer: Medicare Other | Source: Ambulatory Visit | Attending: Cardiovascular Disease | Admitting: Cardiovascular Disease

## 2023-02-03 ENCOUNTER — Other Ambulatory Visit: Payer: Self-pay

## 2023-02-03 ENCOUNTER — Ambulatory Visit (HOSPITAL_COMMUNITY)
Admission: RE | Admit: 2023-02-03 | Discharge: 2023-02-03 | Disposition: A | Discharge status: Home / Self Care | Payer: Medicare Other | Source: Ambulatory Visit | Attending: Cardiovascular Disease | Admitting: Cardiovascular Disease

## 2023-02-03 VITALS — BP 123/77 | HR 65 | Temp 98.3°F | Resp 18 | Ht 62.0 in | Wt 183.2 lb

## 2023-02-03 DIAGNOSIS — J449 Chronic obstructive pulmonary disease, unspecified: Secondary | ICD-10-CM | POA: Diagnosis not present

## 2023-02-03 DIAGNOSIS — R9431 Abnormal electrocardiogram [ECG] [EKG]: Secondary | ICD-10-CM | POA: Insufficient documentation

## 2023-02-03 DIAGNOSIS — Z888 Allergy status to other drugs, medicaments and biological substances status: Secondary | ICD-10-CM | POA: Diagnosis not present

## 2023-02-03 DIAGNOSIS — D649 Anemia, unspecified: Secondary | ICD-10-CM | POA: Diagnosis not present

## 2023-02-03 DIAGNOSIS — Z79899 Other long term (current) drug therapy: Secondary | ICD-10-CM | POA: Diagnosis not present

## 2023-02-03 DIAGNOSIS — Z83719 Family history of colon polyps, unspecified: Secondary | ICD-10-CM | POA: Diagnosis not present

## 2023-02-03 DIAGNOSIS — Z1152 Encounter for screening for COVID-19: Secondary | ICD-10-CM | POA: Insufficient documentation

## 2023-02-03 DIAGNOSIS — I10 Essential (primary) hypertension: Secondary | ICD-10-CM | POA: Diagnosis not present

## 2023-02-03 DIAGNOSIS — K219 Gastro-esophageal reflux disease without esophagitis: Secondary | ICD-10-CM | POA: Diagnosis present

## 2023-02-03 DIAGNOSIS — Z85828 Personal history of other malignant neoplasm of skin: Secondary | ICD-10-CM | POA: Diagnosis not present

## 2023-02-03 DIAGNOSIS — I2510 Atherosclerotic heart disease of native coronary artery without angina pectoris: Secondary | ICD-10-CM | POA: Diagnosis present

## 2023-02-03 DIAGNOSIS — Z006 Encounter for examination for normal comparison and control in clinical research program: Secondary | ICD-10-CM | POA: Diagnosis not present

## 2023-02-03 DIAGNOSIS — Z7985 Long-term (current) use of injectable non-insulin antidiabetic drugs: Secondary | ICD-10-CM | POA: Diagnosis not present

## 2023-02-03 DIAGNOSIS — Z803 Family history of malignant neoplasm of breast: Secondary | ICD-10-CM | POA: Diagnosis not present

## 2023-02-03 DIAGNOSIS — I119 Hypertensive heart disease without heart failure: Secondary | ICD-10-CM | POA: Diagnosis not present

## 2023-02-03 DIAGNOSIS — R911 Solitary pulmonary nodule: Secondary | ICD-10-CM | POA: Diagnosis not present

## 2023-02-03 DIAGNOSIS — I350 Nonrheumatic aortic (valve) stenosis: Secondary | ICD-10-CM | POA: Diagnosis not present

## 2023-02-03 DIAGNOSIS — Z9102 Food additives allergy status: Secondary | ICD-10-CM | POA: Diagnosis not present

## 2023-02-03 DIAGNOSIS — Z01818 Encounter for other preprocedural examination: Secondary | ICD-10-CM | POA: Diagnosis not present

## 2023-02-03 DIAGNOSIS — Z952 Presence of prosthetic heart valve: Secondary | ICD-10-CM | POA: Diagnosis not present

## 2023-02-03 DIAGNOSIS — E781 Pure hyperglyceridemia: Secondary | ICD-10-CM | POA: Diagnosis not present

## 2023-02-03 DIAGNOSIS — E7800 Pure hypercholesterolemia, unspecified: Secondary | ICD-10-CM | POA: Diagnosis not present

## 2023-02-03 DIAGNOSIS — R000 Tachycardia, unspecified: Secondary | ICD-10-CM | POA: Diagnosis present

## 2023-02-03 DIAGNOSIS — K760 Fatty (change of) liver, not elsewhere classified: Secondary | ICD-10-CM | POA: Diagnosis not present

## 2023-02-03 DIAGNOSIS — F32A Depression, unspecified: Secondary | ICD-10-CM | POA: Diagnosis not present

## 2023-02-03 DIAGNOSIS — Z6833 Body mass index (BMI) 33.0-33.9, adult: Secondary | ICD-10-CM | POA: Diagnosis not present

## 2023-02-03 DIAGNOSIS — Z8249 Family history of ischemic heart disease and other diseases of the circulatory system: Secondary | ICD-10-CM | POA: Diagnosis not present

## 2023-02-03 DIAGNOSIS — E669 Obesity, unspecified: Secondary | ICD-10-CM | POA: Diagnosis present

## 2023-02-03 DIAGNOSIS — E119 Type 2 diabetes mellitus without complications: Secondary | ICD-10-CM | POA: Diagnosis not present

## 2023-02-03 DIAGNOSIS — E1149 Type 2 diabetes mellitus with other diabetic neurological complication: Secondary | ICD-10-CM | POA: Diagnosis not present

## 2023-02-03 LAB — URINALYSIS, ROUTINE W REFLEX MICROSCOPIC
Bilirubin Urine: NEGATIVE
Glucose, UA: NEGATIVE mg/dL
Hgb urine dipstick: NEGATIVE
Ketones, ur: NEGATIVE mg/dL
Nitrite: NEGATIVE
Protein, ur: NEGATIVE mg/dL
Specific Gravity, Urine: 1.018 (ref 1.005–1.030)
pH: 7 (ref 5.0–8.0)

## 2023-02-03 LAB — COMPREHENSIVE METABOLIC PANEL
ALT: 19 U/L (ref 0–44)
AST: 24 U/L (ref 15–41)
Albumin: 3.6 g/dL (ref 3.5–5.0)
Alkaline Phosphatase: 73 U/L (ref 38–126)
Anion gap: 12 (ref 5–15)
BUN: 8 mg/dL (ref 8–23)
CO2: 25 mmol/L (ref 22–32)
Calcium: 9 mg/dL (ref 8.9–10.3)
Chloride: 103 mmol/L (ref 98–111)
Creatinine, Ser: 0.6 mg/dL (ref 0.44–1.00)
GFR, Estimated: >60 mL/min (ref >60)
Glucose, Bld: 98 mg/dL (ref 70–99)
Potassium: 4 mmol/L (ref 3.5–5.1)
Sodium: 140 mmol/L (ref 135–145)
Total Bilirubin: 0.7 mg/dL (ref 0.3–1.2)
Total Protein: 6.2 g/dL — ABNORMAL LOW (ref 6.5–8.1)

## 2023-02-03 LAB — CBC
HCT: 38.8 % (ref 36.0–46.0)
Hemoglobin: 13.4 g/dL (ref 12.0–15.0)
MCH: 30 pg (ref 26.0–34.0)
MCHC: 34.5 g/dL (ref 30.0–36.0)
MCV: 87 fL (ref 80.0–100.0)
Platelets: 180 10*3/uL (ref 150–400)
RBC: 4.46 MIL/uL (ref 3.87–5.11)
RDW: 12.9 % (ref 11.5–15.5)
WBC: 6 10*3/uL (ref 4.0–10.5)
nRBC: 0 % (ref 0.0–0.2)

## 2023-02-03 LAB — SARS CORONAVIRUS 2 (TAT 6-24 HRS): SARS Coronavirus 2: NEGATIVE

## 2023-02-03 LAB — TYPE AND SCREEN
ABO/RH(D): A POS
Antibody Screen: NEGATIVE

## 2023-02-03 LAB — SURGICAL PCR SCREEN
MRSA, PCR: NEGATIVE
Staphylococcus aureus: NEGATIVE

## 2023-02-03 LAB — PROTIME-INR
INR: 1.1 (ref 0.8–1.2)
Prothrombin Time: 13.7 seconds (ref 11.4–15.2)

## 2023-02-03 LAB — GLUCOSE, CAPILLARY: Glucose-Capillary: 103 mg/dL — ABNORMAL HIGH (ref 70–99)

## 2023-02-03 NOTE — Progress Notes (Signed)
Patient signed all consents at PAT lab appointment. CHG soap and instructions were given to patient. CHG surgical prep reviewed with patient and all questions answered.  Pt denies any respiratory illness/infection in the last two months. 

## 2023-02-04 MED ORDER — HEPARIN 30,000 UNITS/1000 ML (OHS) CELLSAVER SOLUTION
Status: DC
  Filled 2023-02-04 (×2): qty 1000

## 2023-02-04 MED ORDER — DEXMEDETOMIDINE HCL IN NACL 400 MCG/100ML IV SOLN
0.1000 ug/kg/h | INTRAVENOUS | Status: AC
  Administered 2023-02-05: 83.12 ug via INTRAVENOUS
  Filled 2023-02-04 (×2): qty 100

## 2023-02-04 MED ORDER — CEFAZOLIN SODIUM-DEXTROSE 2-4 GM/100ML-% IV SOLN
2.0000 g | INTRAVENOUS | Status: AC
  Administered 2023-02-05: 2 g via INTRAVENOUS
  Filled 2023-02-04: qty 100

## 2023-02-04 MED ORDER — POTASSIUM CHLORIDE 2 MEQ/ML IV SOLN
80.0000 meq | INTRAVENOUS | Status: DC
  Filled 2023-02-04 (×2): qty 40

## 2023-02-04 MED ORDER — NOREPINEPHRINE 4 MG/250ML-% IV SOLN
0.0000 ug/min | INTRAVENOUS | Status: DC
  Filled 2023-02-04: qty 250

## 2023-02-04 MED ORDER — MAGNESIUM SULFATE 50 % IJ SOLN
40.0000 meq | INTRAMUSCULAR | Status: DC
  Filled 2023-02-04 (×2): qty 9.85

## 2023-02-04 NOTE — Anesthesia Preprocedure Evaluation (Signed)
Anesthesia Evaluation  Patient identified by MRN, date of birth, ID band Patient awake    Reviewed: Allergy & Precautions, NPO status , Patient's Chart, lab work & pertinent test results  Airway Mallampati: II  TM Distance: >3 FB Neck ROM: Full    Dental no notable dental hx.    Pulmonary COPD,  COPD inhaler   Pulmonary exam normal        Cardiovascular hypertension, Pt. on medications and Pt. on home beta blockers + DOE  + Valvular Problems/Murmurs AS  Rhythm:Regular Rate:Normal + Systolic murmurs ECHO 123456: IMPRESSIONS   1. Severe asymmetric basal septal hypertrophy measuring 1.9 cm. There is  mild SAM of the anterior MV leaflet. There is a dynamic subvalvular  gradient, difficult to discern peak based on Dopplers. Peak gradient at  least 40mmHg. Marland Kitchen Left ventricular  ejection fraction, by estimation, is 65 to 70%. The left ventricle has  normal function. The left ventricle has no regional wall motion  abnormalities. There is severe asymmetric left ventricular hypertrophy of  the basal-septal segment. Left ventricular  diastolic parameters are consistent with Grade I diastolic dysfunction  (impaired relaxation). Elevated left atrial pressure.   2. Right ventricular systolic function is normal. The right ventricular  size is normal. Tricuspid regurgitation signal is inadequate for assessing  PA pressure.   3. Left atrial size was severely dilated.   4. The mitral valve is abnormal. No evidence of mitral valve  regurgitation. Mild mitral stenosis.   5. The aortic valve was not well visualized. There is moderate  calcification of the aortic valve. There is moderate thickening of the  aortic valve. Aortic valve regurgitation is not visualized. Severe aortic  valve stenosis. Aortic valve mean gradient  measures 51.0 mmHg. Aortic valve peak gradient measures 88.6 mmHg. Aortic  valve area, by VTI measures 0.73 cm. 04/30/2021 echo  reviewed, aortic  valve reported as normal however AVA VTI 1.04 and mean gradient 24 mmHg  consistent with moderate aortic  stenosis at that time. Stenosis has progressed since 04/30/2021 study.     Neuro/Psych   Anxiety Depression    negative neurological ROS     GI/Hepatic Neg liver ROS,GERD  Medicated,,  Endo/Other  diabetes, Type 2    Renal/GU negative Renal ROS  negative genitourinary   Musculoskeletal  (+) Arthritis , Osteoarthritis,    Abdominal Normal abdominal exam  (+)   Peds  Hematology  (+) Blood dyscrasia, anemia   Anesthesia Other Findings   Reproductive/Obstetrics                             Anesthesia Physical Anesthesia Plan  ASA: 4  Anesthesia Plan: MAC   Post-op Pain Management:    Induction: Intravenous  PONV Risk Score and Plan: 2 and Ondansetron, Dexamethasone, Propofol infusion, Midazolam and Treatment may vary due to age or medical condition  Airway Management Planned: Simple Face Mask and Nasal Cannula  Additional Equipment: Arterial line  Intra-op Plan:   Post-operative Plan:   Informed Consent: I have reviewed the patients History and Physical, chart, labs and discussed the procedure including the risks, benefits and alternatives for the proposed anesthesia with the patient or authorized representative who has indicated his/her understanding and acceptance.     Dental advisory given  Plan Discussed with: CRNA  Anesthesia Plan Comments: (Lab Results      Component  Value               Date                      WBC                      6.0                 02/03/2023                HGB                      13.4                02/03/2023                HCT                      38.8                02/03/2023                MCV                      87.0                02/03/2023                PLT                      180                 02/03/2023             Lab Results      Component                 Value               Date                      NA                       140                 02/03/2023                K                        4.0                 02/03/2023                CO2                      25                  02/03/2023                GLUCOSE                  98                  02/03/2023                BUN  8                   02/03/2023                CREATININE               0.60                02/03/2023                CALCIUM                  9.0                 02/03/2023                EGFR                     93                  11/16/2021                GFRNONAA                 >60                 02/03/2023           )       Anesthesia Quick Evaluation

## 2023-02-04 NOTE — H&P (Signed)
[address]       [place] [address]             [phone]                                   Abigail [name] Medical Record [id number] Date of Birth: January 31, 1951   Chalmers Guest, MD Eugenia Pancoast, FNP   Chief Complaint:  DOE and aortic stenosis    History of Present Illness:     Pt is a very pleasant 72 yo female who has known aortic stenosis but was mild and asymptomatic who had not been seen for awhile who was noted to have an increased murmur and repeat echo now with severe AS with an echo gradient of 49mmHg and a cath pull back of 47mmHg. She has normal LV function and noted asymmetric hypertrophy of the septum at 1.9cm with a 104mmHg dynamic stenosis with mild SAM. She had non obstructive CAD at time of cath and with CTA she had femoral access for a sized Sapien valve of 17mm. She has been seen by Dr Burt Knack who has explained the procedure to her and her husband of TAVR and pt now here to be assessed surgically for the her AS. She currently becomes SOB with walking to her mailbox and back but denies syncope, cp or palpitations             Past Medical History:  Diagnosis Date   Allergy     Anxiety     Arthritis      hands, knees   Cancer ([place])     Cataract      surgery to remove -bilateral   COPD (chronic obstructive pulmonary disease) ([place])     Depression     Diabetes mellitus ([place])      a. A1c 6.6 in 05/2013 indicating new dx.   Fatty liver     Female stress incontinence     GERD (gastroesophageal reflux disease)     Heart murmur      no problems   Hemorrhoids     History of kidney stones      surgery to remove   Hyperlipidemia     Hyperplastic colon polyp 06/07/2008   Hypertension     Hypertriglyceridemia     IBS (irritable bowel syndrome)     Lung nodule      a. 10mm left lung nodule by CT 05/2013.   Neuromuscular disorder (HCC)      neuropathy feet   Obesity     Psoriasis     SCCA (squamous cell carcinoma) of  skin 01/16/2022    Left Malar Cheek (in situ) (tx p bx)   Skin cancer     Squamous cell carcinoma of skin 01/16/2022    Right Breast (in situ) (curet and 5FU)           Past Surgical History:  Procedure Laterality Date   CATARACT EXTRACTION W/PHACO Left 10/06/2014    Procedure: CATARACT EXTRACTION PHACO AND INTRAOCULAR LENS PLACEMENT LEFT EYE;  Surgeon: Tonny Branch, MD;  Location: [facility];  Service: Ophthalmology;  Laterality: Left;  CDE 7.35   CATARACT EXTRACTION W/PHACO Right 11/14/2014    Procedure: CATARACT EXTRACTION PHACO AND INTRAOCULAR LENS PLACEMENT RIGHT EYE;  Surgeon: Tonny Branch, MD;  Location: [facility];  Service: Ophthalmology;  Laterality: Right;  CDE:6.39   COLONOSCOPY  06/2008    hx polyps   CYSTOSCOPY W/ RETROGRADES Right 01/21/2017    Procedure: CYSTOSCOPY WITH RETROGRADE PYELOGRAM;  Surgeon: Hollice Espy, MD;  Location: [facility];  Service: Urology;  Laterality: Right;   CYSTOSCOPY/URETEROSCOPY/HOLMIUM LASER/STENT PLACEMENT Left 01/21/2017    Procedure: CYSTOSCOPY/URETEROSCOPY/HOLMIUM LASER/STENT PLACEMENT;  Surgeon: Hollice Espy, MD;  Location: [facility];  Service: Urology;  Laterality: Left;   DORSAL COMPARTMENT RELEASE Left 06/15/2015    Procedure: LEFT WRIST DEQUERVAINS;  Surgeon: Ninetta Lights, MD;  Location: [facility];  Service: Orthopedics;  Laterality: Left;   HERNIA REPAIR       MOUTH SURGERY        tooth ext   NASAL SINUS SURGERY       RIGHT/LEFT HEART CATH AND CORONARY ANGIOGRAPHY N/A 01/08/2023    Procedure: RIGHT/LEFT HEART CATH AND CORONARY ANGIOGRAPHY;  Surgeon: Sherren Mocha, MD;  Location: [facility];  Service: Cardiovascular;  Laterality: N/A;   SHOULDER SURGERY        right   TONSILLECTOMY       UPPER GI ENDOSCOPY        normal per patient   VENTRAL HERNIA REPAIR          Social History       Tobacco Use  Smoking Status Never  Smokeless Tobacco Never    Social History       Substance and Sexual Activity  Alcohol  Use No      Social History         Socioeconomic History   Marital status: Married      Spouse name: [name]   Number of children: 2   Years of education: trade school   Highest education level: Not on file  Occupational History   Occupation: retired  Tobacco Use   Smoking status: Never   Smokeless tobacco: Never  Vaping Use   Vaping Use: Never used  Substance and Sexual Activity   Alcohol use: No   Drug use: No   Sexual activity: Yes      Birth control/protection: Post-menopausal  Other Topics Concern   Not on file  Social History Narrative    01/24/22    From: the area    Living: with [name], husband (1968)    Work: retired - school bus Librarian, academic         Family: 2 children - [name] and [name] - 4 grandchildren         Enjoys: farm life, play with grandchildren, travel         Exercise: stairs at home 20 times a day, walking the property    Diet: not following diabetic diet but trying to do better         Safety    Seat belts: Yes     Guns: Yes  and secure    Safe in relationships: Yes          Social Determinants of Health        Financial Resource Strain: Low Risk  (02/05/2022)    Overall Financial Resource Strain (CARDIA)     Difficulty of Paying Living Expenses: Not hard at all  Food Insecurity: No Food Insecurity (02/05/2022)    Hunger Vital Sign     Worried About Running Out of Food in the Last Year: Never true     [place] in the Last Year: Never true  Transportation Needs: No Transportation Needs (02/05/2022)    [place] - Transportation  Lack of Transportation (Medical): No     Lack of Transportation (Non-Medical): No  Physical Activity: Insufficiently Active (02/05/2022)    Exercise Vital Sign     Days of Exercise per Week: 7 days     Minutes of Exercise per Session: 20 min  Stress: No Stress Concern Present (02/05/2022)    [place]     Feeling of Stress : Not at all  Social  Connections: [place] (02/05/2022)    Social Connection and Isolation Panel [NHANES]     Frequency of Communication with Friends and Family: More than three times a week     Frequency of Social Gatherings with Friends and Family: More than three times a week     Attends Religious Services: More than 4 times per year     Active Member of [employer] or Organizations: Yes     Attends Music therapist: More than 4 times per year     Marital Status: Married  Human resources officer Violence: Not At Risk (02/05/2022)    Humiliation, Afraid, Rape, and Kick questionnaire     Fear of Current or Ex-Partner: No     Emotionally Abused: No     Physically Abused: No     Sexually Abused: No           Allergies  Allergen Reactions   Statins Other (See Comments)      Failed simvastatin, pravastatin, and rosuvastatin 5 mg weekly (myalgias)   Cinnamon        Dry throat             Current Outpatient Medications  Medication Sig Dispense Refill   albuterol (VENTOLIN HFA) 108 (90 Base) MCG/ACT inhaler Inhale 2 puffs into the lungs every 4 (four) hours as needed for wheezing or shortness of breath. 1 each 1   Azelastine HCl 137 MCG/SPRAY SOLN Place 1 spray into both nostrils daily as needed (allergies/sinus issues.).       clobetasol (TEMOVATE) 0.05 % external solution Apply 1 application topically 2 (two) times daily. (Patient taking differently: Apply 1 application  topically 2 (two) times daily as needed (psoriasis).) 50 mL 2   COSENTYX SENSOREADY, 300 MG, 150 MG/ML SOAJ Inject 300 mg into the skin every 30 (thirty) days.       diclofenac (FLECTOR) 1.3 % PTCH Place 1 patch onto the skin 2 (two) times daily. (Patient taking differently: Place 1 patch onto the skin at bedtime as needed (neuropathy pain.).) 60 patch 6   Dietary Management Product (NEOKE BHB) POWD Take 1 Dose by mouth.       DULoxetine (CYMBALTA) 30 MG capsule TAKE 1 CAPSULE(30 MG) BY MOUTH DAILY (Patient taking differently: Take  30 mg by mouth at bedtime.) 90 capsule 2   esomeprazole (NEXIUM) 20 MG capsule Take 20 mg by mouth in the morning and at bedtime.       Evolocumab (REPATHA SURECLICK) [id number] MG/ML SOAJ Inject 140 mg Vivian every two weeks 2 mL 5   furosemide (LASIX) 20 MG tablet Take 1 tablet (20 mg total) by mouth daily as needed. (Patient taking differently: Take 20 mg by mouth daily.) 30 tablet 3   gabapentin (NEURONTIN) 600 MG tablet TAKE 2 TABLETS BY MOUTH IN THE MORNING AND AT NIGHT 360 tablet 2   glucose blood (FREESTYLE LITE) test strip Use as instructed to monitor FSBS 1x daily. Dx: E11.9 100 each 1   glucose monitoring kit (FREESTYLE) monitoring kit 1  each by Does not apply route as needed for other. Dispense one glucometer of choice, testing strips/lancets for once per day glucose checks.  QS for 1 month, 11 refills. 1 each 0   Lancets (FREESTYLE) lancets Use as instructed 100 each 12   lisinopril (ZESTRIL) 10 MG tablet Take 10 mg by mouth every evening.       metoprolol succinate (TOPROL XL) 25 MG 24 hr tablet Take 1 tablet (25 mg total) by mouth daily. 90 tablet 3   Semaglutide, 2 MG/DOSE, 8 MG/3ML SOPN Inject 2 mg as directed once a week. (Patient taking differently: Inject 2 mg as directed every Tuesday.) 3 mL 1    No current facility-administered medications for this visit.             Family History  Problem Relation Age of Onset   Breast cancer Mother     Colon polyps Maternal Aunt     Heart disease Maternal Grandfather     Kidney cancer Neg Hx     Bladder Cancer Neg Hx     Colon cancer Neg Hx     Stomach cancer Neg Hx     Rectal cancer Neg Hx     Pancreatic cancer Neg Hx     Esophageal cancer Neg Hx              Physical Exam: Lungs: clear Card: RR with harsh 4/6 sem Ext: no edema Teeth in good repair Neuro: no focal deficits         Diagnostic Studies & Laboratory data: I have personally reviewed the following studies and agree with the findings   TTE (12/2022) IMPRESSIONS    1. Severe asymmetric basal septal hypertrophy measuring 1.9 cm. There is  mild SAM of the anterior MV leaflet. There is a dynamic subvalvular  gradient, difficult to discern peak based on Dopplers. Peak gradient at  least 34mmHg. Marland Kitchen Left ventricular  ejection fraction, by estimation, is 65 to 70%. The left ventricle has  normal function. The left ventricle has no regional wall motion  abnormalities. There is severe asymmetric left ventricular hypertrophy of  the basal-septal segment. Left ventricular  diastolic parameters are consistent with Grade I diastolic dysfunction  (impaired relaxation). Elevated left atrial pressure.   2. Right ventricular systolic function is normal. The right ventricular  size is normal. Tricuspid regurgitation signal is inadequate for assessing  PA pressure.   3. Left atrial size was severely dilated.   4. The mitral valve is abnormal. No evidence of mitral valve  regurgitation. Mild mitral stenosis.   5. The aortic valve was not well visualized. There is moderate  calcification of the aortic valve. There is moderate thickening of the  aortic valve. Aortic valve regurgitation is not visualized. Severe aortic  valve stenosis. Aortic valve mean gradient  measures 51.0 mmHg. Aortic valve peak gradient measures 88.6 mmHg. Aortic  valve area, by VTI measures 0.73 cm. 04/30/2021 echo reviewed, aortic  valve reported as normal however AVA VTI 1.04 and mean gradient 24 mmHg  consistent with moderate aortic  stenosis at that time. Stenosis has progressed since 04/30/2021 study.   FINDINGS   Left Ventricle: Severe asymmetric basal septal hypertrophy measuring 1.9  cm. There is mild SAM of the anterior MV leaflet. There is a dynamic  subvalvular gradient, difficult to discern peak based on Dopplers. Peak  gradient at least 34mmHg. Left  ventricular ejection fraction, by estimation, is 65 to 70%. The left  ventricle has normal function.  The left ventricle has no regional  wall  motion abnormalities. The left ventricular internal cavity size was normal  in size. There is severe asymmetric  left ventricular hypertrophy of the basal-septal segment. Left ventricular  diastolic parameters are consistent with Grade I diastolic dysfunction  (impaired relaxation). Elevated left atrial pressure.   Right Ventricle: The right ventricular size is normal. Right vetricular  wall thickness was not well visualized. Right ventricular systolic  function is normal. Tricuspid regurgitation signal is inadequate for  assessing PA pressure.   Left Atrium: Left atrial size was severely dilated.   Right Atrium: Right atrial size was normal in size.   Pericardium: There is no evidence of pericardial effusion.   Mitral Valve: The mitral valve is abnormal. There is mild thickening of  the mitral valve leaflet(s). There is mild calcification of the mitral  valve leaflet(s). Mild mitral annular calcification. No evidence of mitral  valve regurgitation. Mild mitral  valve stenosis. MV peak gradient, 11.3 mmHg. The mean mitral valve  gradient is 4.0 mmHg.   Tricuspid Valve: The tricuspid valve is normal in structure. Tricuspid  valve regurgitation is not demonstrated. No evidence of tricuspid  stenosis.   Aortic Valve: The aortic valve was not well visualized. There is moderate  calcification of the aortic valve. There is moderate thickening of the  aortic valve. There is moderate aortic valve annular calcification. Aortic  valve regurgitation is not  visualized. Severe aortic stenosis is present. Aortic valve mean gradient  measures 51.0 mmHg. Aortic valve peak gradient measures 88.6 mmHg. Aortic  valve area, by VTI measures 0.73 cm.   Pulmonic Valve: The pulmonic valve was not well visualized. Pulmonic valve  regurgitation is not visualized. No evidence of pulmonic stenosis.   Aorta: The aortic root is normal in size and structure.   IAS/Shunts: No atrial level shunt  detected by color flow Doppler.     LEFT VENTRICLE  PLAX 2D  LVIDd:         3.50 cm   Diastology  LVIDs:         2.00 cm   LV e' medial:    3.92 cm/s  LV PW:         1.50 cm   LV E/e' medial:  26.0  LV IVS:        1.80 cm   LV e' lateral:   4.13 cm/s  LVOT diam:     1.70 cm   LV E/e' lateral: 24.7  LV SV:         78  LV SV Index:   42  LVOT Area:     2.27 cm     RIGHT VENTRICLE  RV S prime:     12.40 cm/s  TAPSE (M-mode): 2.3 cm   LEFT ATRIUM             Index        RIGHT ATRIUM           Index  LA diam:        4.20 cm 2.25 cm/m   RA Area:     11.60 cm  LA Vol (A2C):   89.8 ml 48.19 ml/m  RA Volume:   26.30 ml  14.11 ml/m  LA Vol (A4C):   94.2 ml 50.55 ml/m  LA Biplane Vol: 92.8 ml 49.80 ml/m   AORTIC VALVE  AV Area (Vmax):    1.60 cm  AV Area (Vmean):   0.75 cm  AV Area (VTI):  0.73 cm  AV Vmax:           470.67 cm/s  AV Vmean:          321.333 cm/s  AV VTI:            1.063 m  AV Peak Grad:      88.6 mmHg  AV Mean Grad:      51.0 mmHg  LVOT Vmax:         332.00 cm/s  LVOT Vmean:        106.000 cm/s  LVOT VTI:          0.342 m  LVOT/AV VTI ratio: 0.32    AORTA  Ao Root diam: 3.10 cm   MITRAL VALVE  MV Area (PHT): 1.06 cm     SHUNTS  MV Area VTI:   1.83 cm     Systemic VTI:  0.34 m  MV Peak grad:  11.3 mmHg    Systemic Diam: 1.70 cm  MV Mean grad:  4.0 mmHg  MV Vmax:       1.68 m/s  MV Vmean:      88.1 cm/s  MV Decel Time: 718 msec  MV E velocity: 102.00 cm/s  MV A velocity: 171.00 cm/s  MV E/A ratio:  0.60    CATH (01/2023) Conclusion   Prox LAD to Mid LAD lesion is 40% stenosed.   There is severe aortic valve stenosis.   1.  Patent coronary arteries with mild diffuse nonobstructive plaquing, mid LAD with 40% stenosis, widely patent left main, left circumflex, and RCA 2.  Severe calcific aortic stenosis with mean transvalvular gradient 61 mmHg and calculated aortic valve area 0.63 cm 3.  Mildly increased right heart diastolic filling  pressures with increased pulmonary wedge pressure of 20 mmHg and preserved cardiac output of 4.74 L/min      Recent Radiology Findings:   CTA (01/2023) FINDINGS: Aortic Valve: Calcified tri leaflet AV with score 1941   Aorta: No aneurysm normal arch vessels mild calcific atherosclerosis   Sino-tubular Junction: 24 mm   Ascending Thoracic Aorta: 29 mm   Aortic Arch: 23 mm   Descending Thoracic Aorta: 24 mm   Sinus of Valsalva Measurements:   Non-coronary: 28.07 mm   Right - coronary: 28.05 mm  Height 16.8 mm   Left -   coronary: 28.69 mm  Height 17.9 mm   Coronary Artery Height above Annulus:   Left Main: 12.47 mm above annulus   Right Coronary: 12.96 mm above annulus   Virtual Basal Annulus Measurements: Motion artifact measurements done in 20% phase   Maximum / Minimum Diameter: 26.34 mm x 20.24 mm   Perimeter: 72.45 mm   Area: 384.21 mm 2   Coronary Arteries: Sufficient height above annulus for deployment   Optimum Fluoroscopic Angle for Delivery: RAO 5 Cranial 8 degrees   IMPRESSION: 1. Calcified tri leaflet aortic valve with score 1941   2. Annular area of 384 mm2 suitable for a 23 mm Sapien 3 valve or alternatively a 26 mm Medtronic Evolut valve   3.  Coronary arteries sufficient height above annulus for deployment   4. Optimum angiographic angle for deployment RAO 5 Cranial 8 degrees   5.  Membranous septal length 7.9 mm     Recent Lab Findings: Recent Labs       Lab Results  Component Value Date    WBC 7.6 01/01/2023    HGB 11.9 (L) 01/08/2023    HGB 11.9 (L) 01/08/2023  HCT 35.0 (L) 01/08/2023    HCT 35.0 (L) 01/08/2023    PLT 189 01/01/2023    GLUCOSE 92 01/01/2023    CHOL 282 (H) 11/08/2022    TRIG 224.0 (H) 11/08/2022    HDL 51.00 11/08/2022    LDLDIRECT 204.0 11/08/2022    LDLCALC 168 (H) 07/02/2021    ALT 19 11/08/2022    AST 20 11/08/2022    NA 142 01/08/2023    NA 142 01/08/2023    K 3.6 01/08/2023    K 3.6 01/08/2023     CL 106 01/01/2023    CREATININE 0.66 01/01/2023    BUN 15 01/01/2023    CO2 25 01/01/2023    TSH 0.91 04/02/2017    HGBA1C 6.0 (A) 11/08/2022            Assessment / Plan:     Pt is a 72 yo with NYHA class 2 symptoms of severe AS with a hypertophic septum and normal LV function and nonobstructive CAD. Pt should proceed with AVR for her symptomatic AS and with her age we discussed the option for TAVR and after all the risks and benefits were discussed, she wishes to proceed with TAVR. She understands her risks of arterial injury, need for PPM and CVA and if aortic injury occurs she would be a bail out for surgical intervention. I agree with femoral access (right) with a size 23 sapien valve

## 2023-02-05 ENCOUNTER — Encounter (HOSPITAL_COMMUNITY): Payer: Self-pay | Admitting: Cardiovascular Disease

## 2023-02-05 ENCOUNTER — Other Ambulatory Visit: Payer: Self-pay

## 2023-02-05 ENCOUNTER — Encounter (HOSPITAL_COMMUNITY)
Admission: RE | Disposition: A | Discharge status: Home / Self Care | Payer: Self-pay | Source: Ambulatory Visit | Attending: Cardiovascular Disease

## 2023-02-05 ENCOUNTER — Inpatient Hospital Stay (HOSPITAL_COMMUNITY): Payer: Medicare Other

## 2023-02-05 ENCOUNTER — Inpatient Hospital Stay (HOSPITAL_COMMUNITY): Payer: Medicare Other | Admitting: Physician Assistant

## 2023-02-05 ENCOUNTER — Other Ambulatory Visit: Payer: Self-pay | Admitting: Cardiology

## 2023-02-05 ENCOUNTER — Encounter (HOSPITAL_COMMUNITY): Payer: Self-pay

## 2023-02-05 ENCOUNTER — Inpatient Hospital Stay (HOSPITAL_COMMUNITY): Admit: 2023-02-05 | Payer: Medicare Other | Admitting: Cardiovascular Disease

## 2023-02-05 ENCOUNTER — Inpatient Hospital Stay (HOSPITAL_COMMUNITY)
Admission: RE | Admit: 2023-02-05 | Discharge: 2023-02-06 | DRG: 267 | Disposition: A | Discharge status: Home / Self Care | Payer: Medicare Other | Source: Ambulatory Visit | Attending: Cardiovascular Disease | Admitting: Cardiovascular Disease

## 2023-02-05 DIAGNOSIS — F32A Depression, unspecified: Secondary | ICD-10-CM | POA: Diagnosis not present

## 2023-02-05 DIAGNOSIS — K219 Gastro-esophageal reflux disease without esophagitis: Secondary | ICD-10-CM | POA: Diagnosis present

## 2023-02-05 DIAGNOSIS — R911 Solitary pulmonary nodule: Secondary | ICD-10-CM | POA: Diagnosis not present

## 2023-02-05 DIAGNOSIS — Z83719 Family history of colon polyps, unspecified: Secondary | ICD-10-CM | POA: Diagnosis not present

## 2023-02-05 DIAGNOSIS — Z6833 Body mass index (BMI) 33.0-33.9, adult: Secondary | ICD-10-CM | POA: Diagnosis not present

## 2023-02-05 DIAGNOSIS — J449 Chronic obstructive pulmonary disease, unspecified: Secondary | ICD-10-CM | POA: Diagnosis present

## 2023-02-05 DIAGNOSIS — E1149 Type 2 diabetes mellitus with other diabetic neurological complication: Secondary | ICD-10-CM | POA: Diagnosis not present

## 2023-02-05 DIAGNOSIS — E669 Obesity, unspecified: Secondary | ICD-10-CM | POA: Diagnosis present

## 2023-02-05 DIAGNOSIS — R000 Tachycardia, unspecified: Secondary | ICD-10-CM | POA: Diagnosis present

## 2023-02-05 DIAGNOSIS — Z952 Presence of prosthetic heart valve: Secondary | ICD-10-CM | POA: Diagnosis not present

## 2023-02-05 DIAGNOSIS — I350 Nonrheumatic aortic (valve) stenosis: Secondary | ICD-10-CM

## 2023-02-05 DIAGNOSIS — I10 Essential (primary) hypertension: Secondary | ICD-10-CM | POA: Diagnosis not present

## 2023-02-05 DIAGNOSIS — Z9102 Food additives allergy status: Secondary | ICD-10-CM

## 2023-02-05 DIAGNOSIS — K760 Fatty (change of) liver, not elsewhere classified: Secondary | ICD-10-CM | POA: Diagnosis present

## 2023-02-05 DIAGNOSIS — Z7985 Long-term (current) use of injectable non-insulin antidiabetic drugs: Secondary | ICD-10-CM

## 2023-02-05 DIAGNOSIS — E781 Pure hyperglyceridemia: Secondary | ICD-10-CM | POA: Diagnosis present

## 2023-02-05 DIAGNOSIS — Z85828 Personal history of other malignant neoplasm of skin: Secondary | ICD-10-CM

## 2023-02-05 DIAGNOSIS — Z006 Encounter for examination for normal comparison and control in clinical research program: Secondary | ICD-10-CM | POA: Diagnosis not present

## 2023-02-05 DIAGNOSIS — E785 Hyperlipidemia, unspecified: Secondary | ICD-10-CM | POA: Diagnosis present

## 2023-02-05 DIAGNOSIS — I422 Other hypertrophic cardiomyopathy: Secondary | ICD-10-CM

## 2023-02-05 DIAGNOSIS — Z803 Family history of malignant neoplasm of breast: Secondary | ICD-10-CM

## 2023-02-05 DIAGNOSIS — D649 Anemia, unspecified: Secondary | ICD-10-CM

## 2023-02-05 DIAGNOSIS — I119 Hypertensive heart disease without heart failure: Secondary | ICD-10-CM | POA: Diagnosis not present

## 2023-02-05 DIAGNOSIS — Z79899 Other long term (current) drug therapy: Secondary | ICD-10-CM

## 2023-02-05 DIAGNOSIS — E7800 Pure hypercholesterolemia, unspecified: Secondary | ICD-10-CM | POA: Diagnosis not present

## 2023-02-05 DIAGNOSIS — Z888 Allergy status to other drugs, medicaments and biological substances status: Secondary | ICD-10-CM | POA: Diagnosis not present

## 2023-02-05 DIAGNOSIS — I2510 Atherosclerotic heart disease of native coronary artery without angina pectoris: Secondary | ICD-10-CM | POA: Diagnosis present

## 2023-02-05 DIAGNOSIS — E119 Type 2 diabetes mellitus without complications: Secondary | ICD-10-CM

## 2023-02-05 DIAGNOSIS — Z8249 Family history of ischemic heart disease and other diseases of the circulatory system: Secondary | ICD-10-CM | POA: Diagnosis not present

## 2023-02-05 HISTORY — PX: TRANSCATHETER AORTIC VALVE REPLACEMENT, TRANSFEMORAL: SHX6400

## 2023-02-05 HISTORY — PX: INTRAOPERATIVE TRANSTHORACIC ECHOCARDIOGRAM: SHX6523

## 2023-02-05 HISTORY — DX: Presence of prosthetic heart valve: Z95.2

## 2023-02-05 LAB — ECHOCARDIOGRAM LIMITED
AR max vel: 2.86 cm2
AV Area VTI: 2.82 cm2
AV Area mean vel: 3.24 cm2
AV Mean grad: 8 mmHg
AV Peak grad: 15.1 mmHg
Ao pk vel: 1.95 m/s
S' Lateral: 2.6 cm

## 2023-02-05 LAB — POCT I-STAT, CHEM 8
BUN: 9 mg/dL (ref 8–23)
BUN: 8 mg/dL (ref 8–23)
BUN: 9 mg/dL (ref 8–23)
BUN: 8 mg/dL (ref 8–23)
Calcium, Ion: 1.25 mmol/L (ref 1.15–1.40)
Calcium, Ion: 1.21 mmol/L (ref 1.15–1.40)
Calcium, Ion: 1.23 mmol/L (ref 1.15–1.40)
Calcium, Ion: 1.25 mmol/L (ref 1.15–1.40)
Chloride: 104 mmol/L (ref 98–111)
Chloride: 104 mmol/L (ref 98–111)
Chloride: 104 mmol/L (ref 98–111)
Chloride: 101 mmol/L (ref 98–111)
Creatinine, Ser: 0.6 mg/dL (ref 0.44–1.00)
Creatinine, Ser: 0.5 mg/dL (ref 0.44–1.00)
Creatinine, Ser: 0.6 mg/dL (ref 0.44–1.00)
Creatinine, Ser: 0.6 mg/dL (ref 0.44–1.00)
Glucose, Bld: 115 mg/dL — ABNORMAL HIGH (ref 70–99)
Glucose, Bld: 166 mg/dL — ABNORMAL HIGH (ref 70–99)
Glucose, Bld: 166 mg/dL — ABNORMAL HIGH (ref 70–99)
Glucose, Bld: 134 mg/dL — ABNORMAL HIGH (ref 70–99)
HCT: 35 % — ABNORMAL LOW (ref 36.0–46.0)
HCT: 33 % — ABNORMAL LOW (ref 36.0–46.0)
HCT: 33 % — ABNORMAL LOW (ref 36.0–46.0)
HCT: 34 % — ABNORMAL LOW (ref 36.0–46.0)
Hemoglobin: 11.9 g/dL — ABNORMAL LOW (ref 12.0–15.0)
Hemoglobin: 11.2 g/dL — ABNORMAL LOW (ref 12.0–15.0)
Hemoglobin: 11.2 g/dL — ABNORMAL LOW (ref 12.0–15.0)
Hemoglobin: 11.6 g/dL — ABNORMAL LOW (ref 12.0–15.0)
Potassium: 3.6 mmol/L (ref 3.5–5.1)
Potassium: 3.6 mmol/L (ref 3.5–5.1)
Potassium: 3.8 mmol/L (ref 3.5–5.1)
Potassium: 3.4 mmol/L — ABNORMAL LOW (ref 3.5–5.1)
Sodium: 143 mmol/L (ref 135–145)
Sodium: 141 mmol/L (ref 135–145)
Sodium: 142 mmol/L (ref 135–145)
Sodium: 143 mmol/L (ref 135–145)
TCO2: 27 mmol/L (ref 22–32)
TCO2: 27 mmol/L (ref 22–32)
TCO2: 29 mmol/L (ref 22–32)
TCO2: 27 mmol/L (ref 22–32)

## 2023-02-05 LAB — GLUCOSE, CAPILLARY
Glucose-Capillary: 127 mg/dL — ABNORMAL HIGH (ref 70–99)
Glucose-Capillary: 84 mg/dL (ref 70–99)
Glucose-Capillary: 135 mg/dL — ABNORMAL HIGH (ref 70–99)
Glucose-Capillary: 129 mg/dL — ABNORMAL HIGH (ref 70–99)
Glucose-Capillary: 161 mg/dL — ABNORMAL HIGH (ref 70–99)

## 2023-02-05 LAB — ABO/RH: ABO/RH(D): A POS

## 2023-02-05 SURGERY — IMPLANTATION, AORTIC VALVE, TRANSCATHETER, FEMORAL APPROACH
Anesthesia: Monitor Anesthesia Care

## 2023-02-05 SURGERY — IMPLANTATION, AORTIC VALVE, TRANSCATHETER, FEMORAL APPROACH
Anesthesia: Monitor Anesthesia Care | Laterality: Right

## 2023-02-05 MED ORDER — CHLORHEXIDINE GLUCONATE 4 % EX LIQD
60.0000 mL | Freq: Once | CUTANEOUS | Status: DC
  Filled 2023-02-05: qty 60

## 2023-02-05 MED ORDER — IOHEXOL 350 MG/ML SOLN
INTRAVENOUS | Status: DC | PRN
  Administered 2023-02-05: 40 mL

## 2023-02-05 MED ORDER — ASPIRIN 81 MG PO CHEW
81.0000 mg | CHEWABLE_TABLET | Freq: Every day | ORAL | Status: DC
  Administered 2023-02-06: 81 mg via ORAL
  Filled 2023-02-05: qty 1

## 2023-02-05 MED ORDER — PANTOPRAZOLE SODIUM 40 MG PO TBEC
40.0000 mg | DELAYED_RELEASE_TABLET | Freq: Every day | ORAL | Status: DC
  Administered 2023-02-05 – 2023-02-06 (×2): 40 mg via ORAL
  Filled 2023-02-05 (×2): qty 1

## 2023-02-05 MED ORDER — OXYCODONE HCL 5 MG PO TABS: 5.0000 mg | ORAL_TABLET | ORAL | Status: DC | PRN

## 2023-02-05 MED ORDER — SODIUM CHLORIDE 0.9 % IV SOLN: INTRAVENOUS | Status: DC

## 2023-02-05 MED ORDER — ACETAMINOPHEN 325 MG PO TABS
650.0000 mg | ORAL_TABLET | Freq: Four times a day (QID) | ORAL | Status: DC | PRN
  Administered 2023-02-06: 650 mg via ORAL
  Filled 2023-02-05: qty 2

## 2023-02-05 MED ORDER — ACETAMINOPHEN 650 MG RE SUPP: 650.0000 mg | Freq: Four times a day (QID) | RECTAL | Status: DC | PRN

## 2023-02-05 MED ORDER — LIDOCAINE HCL (PF) 1 % IJ SOLN
INTRAMUSCULAR | Status: DC | PRN
  Administered 2023-02-05 (×2): 10 mL

## 2023-02-05 MED ORDER — SODIUM CHLORIDE 0.9% FLUSH
3.0000 mL | Freq: Two times a day (BID) | INTRAVENOUS | Status: DC
  Administered 2023-02-05 – 2023-02-06 (×2): 3 mL via INTRAVENOUS

## 2023-02-05 MED ORDER — PROPOFOL 10 MG/ML IV BOLUS
INTRAVENOUS | Status: DC | PRN
  Administered 2023-02-05: 10 mg via INTRAVENOUS

## 2023-02-05 MED ORDER — SODIUM CHLORIDE 0.9 % IV SOLN: 250.0000 mL | INTRAVENOUS | Status: DC | PRN

## 2023-02-05 MED ORDER — CHLORHEXIDINE GLUCONATE 0.12 % MT SOLN
15.0000 mL | Freq: Once | OROMUCOSAL | Status: AC
  Administered 2023-02-05: 15 mL via OROMUCOSAL
  Filled 2023-02-05 (×2): qty 15

## 2023-02-05 MED ORDER — LIDOCAINE HCL (PF) 1 % IJ SOLN
INTRAMUSCULAR | Status: AC
  Filled 2023-02-05: qty 30

## 2023-02-05 MED ORDER — MORPHINE SULFATE (PF) 2 MG/ML IV SOLN: 1.0000 mg | INTRAVENOUS | Status: DC | PRN

## 2023-02-05 MED ORDER — LACTATED RINGERS IV SOLN: INTRAVENOUS | Status: DC | PRN

## 2023-02-05 MED ORDER — HEPARIN SODIUM (PORCINE) 1000 UNIT/ML IJ SOLN
INTRAMUSCULAR | Status: DC | PRN
  Administered 2023-02-05: 13000 [IU] via INTRAVENOUS

## 2023-02-05 MED ORDER — CEFAZOLIN SODIUM-DEXTROSE 2-4 GM/100ML-% IV SOLN
2.0000 g | Freq: Three times a day (TID) | INTRAVENOUS | Status: AC
  Administered 2023-02-05 – 2023-02-06 (×2): 2 g via INTRAVENOUS
  Filled 2023-02-05 (×2): qty 100

## 2023-02-05 MED ORDER — PROTAMINE SULFATE 10 MG/ML IV SOLN
INTRAVENOUS | Status: DC | PRN
  Administered 2023-02-05: 130 mg via INTRAVENOUS

## 2023-02-05 MED ORDER — PROPOFOL 500 MG/50ML IV EMUL
INTRAVENOUS | Status: DC | PRN
  Administered 2023-02-05: 20 ug/kg/min via INTRAVENOUS

## 2023-02-05 MED ORDER — SODIUM CHLORIDE 0.9 % IV SOLN: INTRAVENOUS | Status: AC

## 2023-02-05 MED ORDER — INSULIN ASPART 100 UNIT/ML IJ SOLN
0.0000 [IU] | INTRAMUSCULAR | Status: DC
  Administered 2023-02-05 (×2): 2 [IU] via SUBCUTANEOUS
  Administered 2023-02-06: 4 [IU] via SUBCUTANEOUS

## 2023-02-05 MED ORDER — SODIUM CHLORIDE 0.9 % IV SOLN: 250.0000 mL | INTRAVENOUS | Status: DC

## 2023-02-05 MED ORDER — GABAPENTIN 300 MG PO CAPS
600.0000 mg | ORAL_CAPSULE | Freq: Two times a day (BID) | ORAL | Status: DC
  Administered 2023-02-05 – 2023-02-06 (×3): 600 mg via ORAL
  Filled 2023-02-05 (×3): qty 2

## 2023-02-05 MED ORDER — SODIUM CHLORIDE 0.9% FLUSH: 3.0000 mL | INTRAVENOUS | Status: DC | PRN

## 2023-02-05 MED ORDER — CHLORHEXIDINE GLUCONATE 4 % EX LIQD
30.0000 mL | CUTANEOUS | Status: DC
  Filled 2023-02-05: qty 30

## 2023-02-05 MED ORDER — HEPARIN (PORCINE) IN NACL 1000-0.9 UT/500ML-% IV SOLN
INTRAVENOUS | Status: DC | PRN
  Administered 2023-02-05 (×3): 500 mL

## 2023-02-05 MED ORDER — TRAMADOL HCL 50 MG PO TABS: 50.0000 mg | ORAL_TABLET | ORAL | Status: DC | PRN

## 2023-02-05 MED ORDER — ONDANSETRON HCL 4 MG/2ML IJ SOLN: 4.0000 mg | Freq: Four times a day (QID) | INTRAMUSCULAR | Status: DC | PRN

## 2023-02-05 MED ORDER — NITROGLYCERIN IN D5W 200-5 MCG/ML-% IV SOLN
0.0000 ug/min | INTRAVENOUS | Status: DC
  Filled 2023-02-05: qty 250

## 2023-02-05 SURGICAL SUPPLY — 28 items
BAG SNAP BAND KOVER 36X36 (MISCELLANEOUS) ×4 IMPLANT
CABLE ADAPT PACING TEMP 12FT (ADAPTER) IMPLANT
CATH 23 ULTRA DELIVERY (CATHETERS) IMPLANT
CATH DIAG 6FR PIGTAIL ANGLED (CATHETERS) IMPLANT
CATH INFINITI 6F AL1 (CATHETERS) IMPLANT
CATH S G BIP PACING (CATHETERS) IMPLANT
CLOSURE MYNX CONTROL 6F/7F (Vascular Products) IMPLANT
CLOSURE PERCLOSE PROSTYLE (VASCULAR PRODUCTS) IMPLANT
ELECT DEFIB PAD ADLT CADENCE (PAD) IMPLANT
KIT HEART LEFT (KITS) ×2 IMPLANT
KIT MICROPUNCTURE NIT STIFF (SHEATH) IMPLANT
KIT SAPIAN 3 ULTRA RESILIA 23 (Valve) IMPLANT
PACK CARDIAC CATHETERIZATION (CUSTOM PROCEDURE TRAY) ×2 IMPLANT
SHEATH BRITE TIP 7FR 35CM (SHEATH) IMPLANT
SHEATH INTRODUCER SET 20-26 (SHEATH) IMPLANT
SHEATH PINNACLE 6F 10CM (SHEATH) IMPLANT
SHEATH PINNACLE 8F 10CM (SHEATH) IMPLANT
SHEATH PROBE COVER 6X72 (BAG) IMPLANT
SLEEVE REPOSITIONING LENGTH 30 (MISCELLANEOUS) IMPLANT
STOPCOCK MORSE 400PSI 3WAY (MISCELLANEOUS) ×4 IMPLANT
SYR MEDRAD MARK 7 150ML (SYRINGE) IMPLANT
TRANSDUCER W/STOPCOCK (MISCELLANEOUS) ×4 IMPLANT
TUBING CONTRAST HIGH PRESS 48 (TUBING) IMPLANT
WIRE AMPLATZ SS-J .035X180CM (WIRE) IMPLANT
WIRE EMERALD 3MM-J .035X150CM (WIRE) IMPLANT
WIRE EMERALD 3MM-J .035X260CM (WIRE) IMPLANT
WIRE EMERALD ST .035X260CM (WIRE) IMPLANT
WIRE SAFARI SM CURVE 275 (WIRE) IMPLANT

## 2023-02-05 NOTE — Op Note (Signed)
[facility]   MULTIDISCIPLINARY HEART VALVE TEAM   TAVR OPERATIVE NOTE   Date of Procedure:  02/05/2023  Preoperative Diagnosis: Severe Aortic Stenosis   Postoperative Diagnosis: Same   Procedure:   Transcatheter Aortic Valve Replacement - Percutaneous Transfemoral Approach  Edwards Sapien 3 Ultra Resilia THV (size 23 mm, serial # [id number])   Co-Surgeons:  Coralie Common, MD and Sherren Mocha, MD  Anesthesiologist:  Miguel Aschoff, DO  Echocardiographer:  Sanda Klein, MD  Pre-operative Echo Findings: Severe aortic stenosis Hyperdynamic left ventricular systolic function  Post-operative Echo Findings: No paravalvular leak Unchanged left ventricular systolic function  BRIEF CLINICAL NOTE AND INDICATIONS FOR SURGERY  72 year old woman demonstrated to suffer from severe, symptomatic aortic stenosis.  The patient is limited by NYHA functional class II symptoms of exertional dyspnea and fatigue.  After multidisciplinary heart team review of her case and extensive imaging studies, she is referred for TAVR via a transfemoral approach.  During the course of the patient's preoperative work up they have been evaluated comprehensively by a multidisciplinary team of specialists coordinated through the [facility] in the [facility].  They have been demonstrated to suffer from symptomatic severe aortic stenosis as noted above. The patient has been counseled extensively as to the relative risks and benefits of all options for the treatment of severe aortic stenosis including long term medical therapy, conventional surgery for aortic valve replacement, and transcatheter aortic valve replacement.  The patient has been independently evaluated in formal cardiac surgical consultation by Dr Lavonna Monarch, who deemed the patient appropriate for TAVR. Based upon review of all of the patient's preoperative diagnostic tests they are felt to be  candidate for transcatheter aortic valve replacement using the transfemoral approach as an alternative to conventional surgery.    Following the decision to proceed with transcatheter aortic valve replacement, a discussion has been held regarding what types of management strategies would be attempted intraoperatively in the event of life-threatening complications, including whether or not the patient would be considered a candidate for the use of cardiopulmonary bypass and/or conversion to open sternotomy for attempted surgical intervention.  The patient has been advised of a variety of complications that might develop peculiar to this approach including but not limited to risks of death, stroke, paravalvular leak, aortic dissection or other major vascular complications, aortic annulus rupture, device embolization, cardiac rupture or perforation, acute myocardial infarction, arrhythmia, heart block or bradycardia requiring permanent pacemaker placement, congestive heart failure, respiratory failure, renal failure, pneumonia, infection, other late complications related to structural valve deterioration or migration, or other complications that might ultimately cause a temporary or permanent loss of functional independence or other long term morbidity.  The patient provides full informed consent for the procedure as described and all questions were answered preoperatively.  DETAILS OF THE OPERATIVE PROCEDURE  PREPARATION:   The patient is brought to the operating room on the above mentioned date and central monitoring was established by the anesthesia team including placement of a radial arterial line. The patient is placed in the supine position on the operating table.  Intravenous antibiotics are administered. The patient is monitored closely throughout the procedure under conscious sedation.  Baseline transthoracic echocardiogram is performed. The patient's chest, abdomen, both groins, and both lower  extremities are prepared and draped in a sterile manner. A time out procedure is performed.   PERIPHERAL ACCESS:   Using ultrasound guidance, femoral arterial and venous access is obtained with placement of 6  Fr sheaths on the left side.  [place] images are digitally captured and stored in the patient's chart. A pigtail diagnostic catheter was passed through the femoral arterial sheath under fluoroscopic guidance into the aortic root.  A temporary transvenous pacemaker catheter was passed through the femoral venous sheath under fluoroscopic guidance into the right ventricle.  The pacemaker was tested to ensure stable lead placement and pacemaker capture. Aortic root angiography was performed in order to determine the optimal angiographic angle for valve deployment.  TRANSFEMORAL ACCESS:  A micropuncture technique is used to access the right femoral artery under fluoroscopic and ultrasound guidance.  2 Perclose devices are deployed at 10' and 2' positions to 'PreClose' the femoral artery. An 8 French sheath is placed and then an Amplatz Superstiff wire is advanced through the sheath. This is changed out for a 14 French transfemoral E-Sheath after progressively dilating over the Superstiff wire.  An AL-1 catheter was used to direct a straight-tip exchange length wire across the native aortic valve into the left ventricle. This was exchanged out for a pigtail catheter and position was confirmed in the LV apex. Simultaneous LV and Ao pressures were recorded.  The pigtail catheter was exchanged for a Safari wire in the LV apex.    BALLOON AORTIC VALVULOPLASTY:  Not performed  TRANSCATHETER HEART VALVE DEPLOYMENT:  An Edwards Sapien 3 Ultra Resilia transcatheter heart valve (size 23 mm) was prepared and crimped per manufacturer's guidelines, and the proper orientation of the valve is confirmed on the [employer] delivery system. The valve was advanced through the introducer sheath using normal technique  until in an appropriate position in the abdominal aorta beyond the sheath tip. The balloon was then retracted and using the fine-tuning wheel was centered on the valve. The valve was then advanced across the aortic arch using appropriate flexion of the catheter. The valve was carefully positioned across the aortic valve annulus. The Commander catheter was retracted using normal technique. Once final position of the valve has been confirmed by angiographic assessment, the valve is deployed while temporarily holding ventilation and during rapid ventricular pacing to maintain systolic blood pressure < 50 mmHg and pulse pressure < 10 mmHg. The balloon inflation is held for >3 seconds after reaching full deployment volume. Once the balloon has fully deflated the balloon is retracted into the ascending aorta and valve function is assessed using echocardiography. The patient's hemodynamic recovery following valve deployment is good.  The deployment balloon and guidewire are both removed. Echo demostrated acceptable post-procedural gradients, stable mitral valve function, and no aortic insufficiency.    PROCEDURE COMPLETION:  The sheath was removed and femoral artery closure is performed using the 2 previously deployed Perclose devices.  Protamine is administered once femoral arterial repair was complete. The site is clear with no evidence of bleeding or hematoma after the sutures are tightened. The temporary pacemaker and pigtail catheters are removed. Mynx closure is used for contralateral femoral arterial hemostasis for the 6 Fr sheath.  The patient tolerated the procedure well and is transported to the recovery area in stable condition. There were no immediate intraoperative complications. All sponge instrument and needle counts are verified correct at completion of the operation.   The patient received a total of 40 mL of intravenous contrast during the procedure.  EBL: minimal  Sherren Mocha,  MD 02/05/2023 10:18 AM

## 2023-02-05 NOTE — Anesthesia Procedure Notes (Signed)
Procedure Name: MAC Date/Time: 02/05/2023 8:50 AM  Performed by: Janene Harvey, CRNAPre-anesthesia Checklist: Patient identified, Emergency Drugs available, Suction available and Patient being monitored Patient Re-evaluated:Patient Re-evaluated prior to induction Oxygen Delivery Method: Simple face mask Induction Type: IV induction Placement Confirmation: positive ETCO2 Dental Injury: Teeth and Oropharynx as per pre-operative assessment

## 2023-02-05 NOTE — Anesthesia Procedure Notes (Signed)
Arterial Line Insertion Start/End4/01/2023 8:05 AM Performed by: Janene Harvey, CRNA, CRNA  Patient location: Pre-op. Preanesthetic checklist: patient identified, risks and benefits discussed, surgical consent and pre-op evaluation Lidocaine 1% used for infiltration radial was placed Catheter size: 20 G Hand hygiene performed   Attempts: 1 Procedure performed without using ultrasound guided technique. Following insertion, dressing applied and Biopatch. Post procedure assessment: unchanged  Patient tolerated the procedure well with no immediate complications. Additional procedure comments: Placed by Isa Rankin.

## 2023-02-05 NOTE — Discharge Summary (Incomplete)
[facility] VALVE TEAM  Discharge Summary    Patient ID: Abigail [name] MRN: [id number]; DOB: [birth year]  Admit date: 02/05/2023 Discharge date: 02/06/2023  Primary Care Provider: Eugenia [name], [place]  Primary Cardiologist: Chalmers Guest, MD / Dr. Burt Knack, MD & Dr. Lavonna Monarch, MD (TAVR)   Discharge Diagnoses    Principal Problem:   S/P TAVR (transcatheter aortic valve replacement) Active Problems:   Hypercholesterolemia with LDL greater than 190 mg/dL   Essential hypertension   Pulmonary nodule   Type 2 diabetes mellitus with neurological complications   COPD (chronic obstructive pulmonary disease)   Severe aortic stenosis   Hypertrophic cardiomyopathy  Allergies Allergies  Allergen Reactions   Statins Other (See Comments)    Failed simvastatin, pravastatin, and rosuvastatin 5 mg weekly (myalgias)   Cinnamon     Dry throat    Diagnostic Studies/Procedures    TAVR OPERATIVE NOTE     Date of Procedure:                02/05/2023   Preoperative Diagnosis:      Severe Aortic Stenosis    Postoperative Diagnosis:    Same    Procedure:        Transcatheter Aortic Valve Replacement - Percutaneous Transfemoral Approach             Edwards Sapien 3 Ultra Resilia THV (size 23 mm, serial # [id number])              Co-Surgeons:                        Coralie Common, MD and Sherren Mocha, MD   Anesthesiologist:                  Miguel Aschoff, DO   Echocardiographer:              Sanda Klein, MD   Pre-operative Echo Findings: Severe aortic stenosis Hyperdynamic left ventricular systolic function   Post-operative Echo Findings: No paravalvular leak Unchanged left ventricular systolic function   ____________   Echo 02/06/23: completed but pending formal read at the time of discharge   History of Present Illness     Abigail [name] is a 72 y.o. female with a history of HTN, HLD, DM2, COPD, HCM, and severe aortic stenosis who  presented to [facility] on 12/07/22 for planned TAVR.   [name] is followed by Dr. Dellia Cloud for her cardiology care after being evaluated by her PCP for worsening SOB and DOE. Echocardiogram performed 12/09/22 given her symptoms that showed LVEF 65 to 70%, severe asymmetric basal septal hypertrophy measuring 1.9 cm, mild SAM of the anterior MV leaflet, dynamic subvalvular gradient, peak gradient at least 25 mmHg, G1 DD, normal RV systolic function, severely dilated LA, mild mitral valve stenosis, severe stenosis of aortic valve with mean gradient 51 mmHg, peak gradient 88.6 mmHg, valve area 0.73 cm (aortic valve area by VTI was 1.04 cm and mean gradient 24 mmHg on 04/2021 study). She was referred to our structural heart team for further evaluation of her AS.   R/LHC showed nonobstructive CAD with no flow-limiting coronary lesions. Invasive catheter pullback demonstrated a mean transaortic gradient of 61 mmHg and calculated AVA of 0.6 square cm. CTA studies show an aortic valve Ca score of 1941, annular measurements suitable for a 23 mm Sapien 3 Ultra valve, and suitable iliofemoral vessels for transfemoral access.   The patient has  been evaluated by the multidisciplinary valve team and felt to have severe, symptomatic aortic stenosis and to be a suitable candidate for TAVR, which was set up for 02/05/23.    Hospital Course     Severe AS: s/p successful TAVR with a 23 mm Edwards Sapien 3 THV via the TF approach on 02/05/23. Post operative echo with final read pending however spoke to Dr. Burt Knack who reports higher LVOT gradient secondary HOCM therefore will increase metoprolol to 50mg  BID. Groin sites are stable. ECG with NSR and no high grade heart block. Continue ASA monotherapy. Post procedure instructions reviewed with understanding. She has ambulated with CRI with no issues. Patient will require lifelong dental SBE with Amoxicillin.   Tachycardia: Noted to be tachycardic on telemetry today. Metoprolol held in  the setting of TAVR therefore will restart today at 50mg  BID.   HTN: Stable with no changes today  HLD: Continue Repatha.    DM2: SSI while inpatient and will resume home regimen at discharge.   HCM: with LV normalization. Increase metoprolol to 50mg  BID. Will continue to follow with surveillance imaging.   Incidental findings: Small solid pulmonary nodules measuring up to 3 mm. If the patient is high-risk, a non-contrast chest CT can be considered in 12 months. If the patient is low risk, no further follow-up imaging is needed.    Consultants: None    The patient has been seen and examined by Dr. Burt Knack who feels that she is stable and ready for discharge today, 02/06/23.  _____________  Discharge Vitals Blood pressure (!) 140/56, pulse (!) 51, temperature 98.5 F (36.9 C), temperature source Oral, resp. rate 20, height 5\' 2"  (1.575 m), weight 83.6 kg, SpO2 92 %.  Filed Weights   02/05/23 0643 02/06/23 0543  Weight: 83.1 kg 83.6 kg   General: Well developed, well nourished, NAD Neck: Negative for carotid bruits. No JVD Lungs:Clear to ausculation bilaterally. No wheezes, rales, or rhonchi. Breathing is unlabored. Cardiovascular: RRR with S1 S2. No murmurs Abdomen: Soft, non-tender, non-distended. No obvious abdominal masses. Extremities: No edema.Groin sites stable.  Neuro: Alert and oriented. No focal deficits. No facial asymmetry. MAE spontaneously. Psych: Responds to questions appropriately with normal affect.    Labs & Radiologic Studies    CBC Recent Labs    02/05/23 1048 02/06/23 0108  WBC  --  7.4  HGB 11.6* 11.9*  HCT 34.0* 34.7*  MCV  --  86.8  PLT  --  [id number]*   Basic Metabolic Panel Recent Labs    02/05/23 1048 02/06/23 0108  NA 143 136  K 3.4* 3.5  CL 101 102  CO2  --  24  GLUCOSE 134* 173*  BUN 8 8  CREATININE 0.60 0.80  CALCIUM  --  8.3*  MG  --  1.7   Liver Function Tests No results for input(s): "AST", "ALT", "ALKPHOS", "BILITOT", "PROT",  "ALBUMIN" in the last 72 hours.  No results for input(s): "LIPASE", "AMYLASE" in the last 72 hours. Cardiac Enzymes No results for input(s): "CKTOTAL", "CKMB", "CKMBINDEX", "TROPONINI" in the last 72 hours. BNP Invalid input(s): "POCBNP" D-Dimer No results for input(s): "DDIMER" in the last 72 hours. Hemoglobin A1C No results for input(s): "HGBA1C" in the last 72 hours. Fasting Lipid Panel No results for input(s): "CHOL", "HDL", "LDLCALC", "TRIG", "CHOLHDL", "LDLDIRECT" in the last 72 hours. Thyroid Function Tests No results for input(s): "TSH", "T4TOTAL", "T3FREE", "THYROIDAB" in the last 72 hours.  Invalid input(s): "FREET3" _____________  ECHOCARDIOGRAM COMPLETE  Result Date: 02/06/2023  ECHOCARDIOGRAM REPORT   Patient Name:   Abigail [name] [facility] Date of Exam: 02/06/2023 Medical Rec #:  [id number]     Height:       62.0 in Accession #:    [id number]    Weight:       184.3 lb Date of Birth:  [birth year]      BSA:          1.846 m Patient Age:    24 years      BP:           149/62 mmHg Patient Gender: F             HR:           96 bpm. Exam Location:  Inpatient Procedure: 2D Echo, Color Doppler and Cardiac Doppler Indications:    Post TAVR evaluation  History:        Patient has prior history of Echocardiogram examinations, most                 recent 02/05/2023. COPD, Aortic Valve Disease; Risk                 Factors:Dyslipidemia, Hypertension and Diabetes. 17mm Sapien                 TAVR, Hypertropic Cardiomyopathy, DOE.  Sonographer:    Rolla Etienne Referring Phys: [place]  1. Ther is a mid cavitary gradient. Peak gradient 3.92 m/s. Mean gradeint 62 mmHg. With Valsalva, peak velocity increases to 4.05 m/s and peak gradient 66 mmHg. Left ventricular ejection fraction, by estimation, is >75%. The left ventricle has hyperdynamic function. The left ventricle has no regional wall motion abnormalities. There is severe concentric left ventricular hypertrophy. Left ventricular  diastolic parameters are consistent with Grade I diastolic dysfunction (impaired relaxation).  2. Right ventricular systolic function is normal. The right ventricular size is normal.  3. Left atrial size was mildly dilated.  4. Systolic anterior motion of the mitral valve is present. The mitral valve is degenerative. Trivial mitral valve regurgitation. Mild mitral stenosis. Moderate to severe mitral annular calcification.  5. The aortic valve is normal in structure. Aortic valve regurgitation is trivial. No aortic stenosis is present. Echo findings are consistent with normal structure and function of the aortic valve prosthesis. Aortic valve mean gradient measures 15.0 mmHg. Aortic valve Vmax measures 2.58 m/s.  6. The inferior vena cava is normal in size with greater than 50% respiratory variability, suggesting right atrial pressure of 3 mmHg. FINDINGS  Left Ventricle: Ther is a mid cavitary gradient. Peak gradient 3.92 m/s. Mean gradeint 62 mmHg. With Valsalva, peak velocity increases to 4.05 m/s and peak gradient 66 mmHg. Left ventricular ejection fraction, by estimation, is >75%. The left ventricle has hyperdynamic function. The left ventricle has no regional wall motion abnormalities. The left ventricular internal cavity size was normal in size. There is severe concentric left ventricular hypertrophy. Left ventricular diastolic parameters are consistent with Grade I diastolic dysfunction (impaired relaxation). Right Ventricle: The right ventricular size is normal. No increase in right ventricular wall thickness. Right ventricular systolic function is normal. Left Atrium: Left atrial size was mildly dilated. Right Atrium: Right atrial size was normal in size. Pericardium: There is no evidence of pericardial effusion. Mitral Valve: Systolic anterior motion of the mitral valve is present. The mitral valve is degenerative in appearance. There is mild thickening of the mitral valve leaflet(s). There is mild  calcification of the mitral valve  leaflet(s). Moderate to severe  mitral annular calcification. Trivial mitral valve regurgitation. Mild mitral valve stenosis. The mean mitral valve gradient is 5.8 mmHg with average heart rate of 102 bpm. Tricuspid Valve: The tricuspid valve is normal in structure. Tricuspid valve regurgitation is not demonstrated. No evidence of tricuspid stenosis. Aortic Valve: The aortic valve is normal in structure. Aortic valve regurgitation is trivial. No aortic stenosis is present. Aortic valve mean gradient measures 15.0 mmHg. Aortic valve peak gradient measures 26.6 mmHg. There is a 23 mm Sapien prosthetic,  stented (TAVR) valve present in the aortic position. Echo findings are consistent with normal structure and function of the aortic valve prosthesis. Pulmonic Valve: The pulmonic valve was normal in structure. Pulmonic valve regurgitation is not visualized. No evidence of pulmonic stenosis. Aorta: The aortic root is normal in size and structure. Venous: The inferior vena cava is normal in size with greater than 50% respiratory variability, suggesting right atrial pressure of 3 mmHg. IAS/Shunts: No atrial level shunt detected by color flow Doppler.  LEFT VENTRICLE PLAX 2D LVIDd:         3.00 cm     Diastology LVIDs:         1.70 cm     LV e' lateral:   7.15 cm/s LV PW:         1.60 cm     LV E/e' lateral: 17.1 LV IVS:        1.80 cm  LV Volumes (MOD) LV vol d, MOD A2C: 30.8 ml LV vol d, MOD A4C: 35.0 ml LV vol s, MOD A2C: 8.1 ml LV vol s, MOD A4C: 7.3 ml LV SV MOD A2C:     22.7 ml LV SV MOD A4C:     35.0 ml LV SV MOD BP:      26.1 ml RIGHT VENTRICLE RV Basal diam:  2.40 cm RV Mid diam:    1.80 cm RV S prime:     18.50 cm/s TAPSE (M-mode): 2.8 cm LEFT ATRIUM             Index        RIGHT ATRIUM          Index LA diam:        3.70 cm 2.00 cm/m   RA Area:     8.52 cm LA Vol (A2C):   41.5 ml 22.48 ml/m  RA Volume:   13.70 ml 7.42 ml/m LA Vol (A4C):   62.0 ml 33.58 ml/m LA Biplane Vol:  55.5 ml 30.06 ml/m  AORTIC VALVE AV Vmax:           258.00 cm/s AV Vmean:          180.000 cm/s AV VTI:            0.465 m AV Peak Grad:      26.6 mmHg AV Mean Grad:      15.0 mmHg LVOT Vmax:         246.00 cm/s LVOT Vmean:        156.000 cm/s LVOT VTI:          0.366 m LVOT/AV VTI ratio: 0.79 MITRAL VALVE MV Area (PHT): 4.10 cm     SHUNTS MV Mean grad:  5.8 mmHg     Systemic VTI: 0.37 m MV Decel Time: 185 msec MV E velocity: 122.00 cm/s MV A velocity: 192.00 cm/s MV E/A ratio:  0.64 Skeet Latch MD Electronically signed by Skeet Latch MD Signature Date/Time: 02/06/2023/11:45:51 AM  Final    ECHOCARDIOGRAM LIMITED  Result Date: 02/05/2023    ECHOCARDIOGRAM LIMITED REPORT   Patient Name:   Abigail [name] [facility] Date of Exam: 02/05/2023 Medical Rec #:  [id number]     Height:       62.0 in Accession #:    [id number]    Weight:       183.2 lb Date of Birth:  1951-07-03      BSA:          1.842 m Patient Age:    34 years      BP:           140/64 mmHg Patient Gender: F             HR:           62 bpm. Exam Location:  Inpatient Procedure: Limited Echo, Cardiac Doppler and Color Doppler Indications:     I35.0 Nonrheumatic aortic (valve) stenosis  History:         Patient has prior history of Echocardiogram examinations, most                  recent 12/09/2022. Hypertrophic Cardiomyopathy, COPD, Aortic                  Valve Disease and Mitral Valve Disease, Signs/Symptoms:Murmur;                  Risk Factors:Hypertension, Dyslipidemia and Diabetes. Aortic                  stenosis. Dynamic subvalvular gradient. Severe septal                  hypertrophy.                  Aortic Valve: 23 mm Sapien prosthetic, stented (TAVR) valve is                  present in the aortic position. Procedure Date: 02/05/2023.  Sonographer:     Roseanna Rainbow RDCS Referring Phys:  [place] Diagnosing Phys: Sanda Klein MD                   PRE-PROCEDURE EVALUATION                   Hypertrophic, hyperdynamic left ventricular systolic  function,                  estimated ejection fraction 70%.                  There is a mid-cavity "gradient", but there is no systolic                  anterior motion of the mitral valve and there is no subvalvular                  LVOT obstruction.                  Trileaflet aortic valve with severe calcific aortic stenosis.                  Peak aortic valve gradient 91 mm Hg, mean aortic valve gradient                  52 mm Hg, dimensionless index 0.30, calculated aortic valve                  area  by continuity equation 0.95 cm sq (indexed for BSA 0.52 cm                  sq/ m sq).                  Mild-moderate aortic insufficiency.                  Trivial mitral insufficiency.                  No pericardial effusion.                   POST-PROCEDURE EVALUATION                   Markedly hyperdynamic left ventricular systolic function,                  estimated ejection fraction >75%.                  There is systolic cavity obliteration and there is moderate                  systolic anterior motion of the mitral valve.                  Well deployed TAVR stent valve.                  Peak aortic valve gradient 15 mm Hg, mean aortic valve gradient                  8 mm Hg, dimensionless index and aortic valve area not                  calculated due to high LV outflow tract velocity, acceleration                  time 99 ms.                  Trivial perivalvular aortic insufficiency.                  Mild-moderate mitral insufficiency, worsened from baseline, due                  to systolic anterior motion of the mitral valve.                  No pericardial effusion IMPRESSIONS  1. Left ventricular ejection fraction, by estimation, is 70 to 75%. The left ventricle has hyperdynamic function. The left ventricle has no regional wall motion abnormalities. There is moderate concentric left ventricular hypertrophy.  2. Right ventricular systolic function is normal. The right ventricular size is normal.  Tricuspid regurgitation signal is inadequate for assessing PA pressure.  3. Left atrial size was moderately dilated.  4. The mitral valve is degenerative. Trivial mitral valve regurgitation. Mild mitral stenosis. The mean mitral valve gradient is 3.4 mmHg with average heart rate of 63 bpm. Moderate to severe mitral annular calcification.  5. The aortic valve has been repaired/replaced. There is a 23 mm Sapien prosthetic (TAVR) valve present in the aortic position. Procedure Date: 02/05/2023. FINDINGS  Left Ventricle: Left ventricular ejection fraction, by estimation, is 70 to 75%. The left ventricle has hyperdynamic function. The left ventricle has no regional wall motion abnormalities. The left ventricular internal cavity size was normal in size. There is moderate concentric left ventricular hypertrophy. Right Ventricle: The right ventricular size is normal.  No increase in right ventricular wall thickness. Right ventricular systolic function is normal. Tricuspid regurgitation signal is inadequate for assessing PA pressure. Left Atrium: Left atrial size was moderately dilated. Right Atrium: Right atrial size was normal in size. Mitral Valve: The mitral valve is degenerative in appearance. There is mild thickening of the mitral valve leaflet(s). There is mild calcification of the mitral valve leaflet(s). Moderate to severe mitral annular calcification. Trivial mitral valve regurgitation. Mild mitral valve stenosis. The mean mitral valve gradient is 3.4 mmHg with average heart rate of 63 bpm. Tricuspid Valve: The tricuspid valve is normal in structure. Tricuspid valve regurgitation is not demonstrated. Aortic Valve: The aortic valve has been repaired/replaced. Aortic valve mean gradient measures 8.0 mmHg. Aortic valve peak gradient measures 15.1 mmHg. Aortic valve area, by VTI measures 2.82 cm. There is a 23 mm Sapien prosthetic, stented (TAVR) valve present in the aortic position. Procedure Date: 02/05/2023. Pulmonic  Valve: The pulmonic valve was not well visualized. Pulmonic valve regurgitation is not visualized. Aorta: The aortic root is normal in size and structure. IAS/Shunts: The interatrial septum was not well visualized. Additional Comments: Spectral Doppler performed. Color Doppler performed.  LEFT VENTRICLE PLAX 2D LVIDd:         4.80 cm LVIDs:         2.60 cm LV PW:         1.40 cm LV IVS:        1.40 cm LVOT diam:     2.30 cm LV SV:         107 LV SV Index:   58 LVOT Area:     4.15 cm  AORTIC VALVE AV Area (Vmax):    2.86 cm AV Area (Vmean):   3.24 cm AV Area (VTI):     2.82 cm AV Vmax:           194.50 cm/s AV Vmean:          131.000 cm/s AV VTI:            0.380 m AV Peak Grad:      15.1 mmHg AV Mean Grad:      8.0 mmHg LVOT Vmax:         134.00 cm/s LVOT Vmean:        102.000 cm/s LVOT VTI:          0.258 m LVOT/AV VTI ratio: 0.68 MITRAL VALVE MV Mean grad: 3.4 mmHg SHUNTS                        Systemic VTI:  0.26 m                        Systemic Diam: 2.30 cm Sanda Klein MD Electronically signed by Sanda Klein MD Signature Date/Time: 02/05/2023/1:33:41 PM    Final    Structural Heart Procedure  Result Date: 02/05/2023 See surgical note for result.  DG Chest 2 View  Result Date: 02/03/2023 CLINICAL DATA:  Preoperative chest x-ray.  Severe aortic stenosis. EXAM: CHEST - 2 VIEW COMPARISON:  November 08, 2022. FINDINGS: The heart size and mediastinal contours are stable. Aorta is tortuous. Both lungs are clear. The visualized skeletal structures are stable. IMPRESSION: No active cardiopulmonary disease. Electronically Signed   By: Abelardo Diesel M.D.   On: 02/03/2023 08:57   CT CORONARY MORPH W/CTA COR W/SCORE W/CA W/CM &/OR WO/CM  Addendum Date: 01/13/2023   ADDENDUM REPORT: 01/13/2023 13:31 EXAM:  OVER-READ INTERPRETATION  CT CHEST The following report is an over-read performed by radiologist Dr. Maudry Diego [facility] on 01/13/2023. This over-read does not include interpretation  of cardiac or coronary anatomy or pathology. The cardiac TAVR interpretation by the cardiologist is attached. COMPARISON:  None. FINDINGS: Extracardiac findings will be described separately under dictation for contemporaneously obtained CTA chest, abdomen and pelvis. IMPRESSION: Please see separate dictation for contemporaneously obtained CTA chest, abdomen and pelvis dated 01/13/2023 for full description of relevant extracardiac findings. Electronically Signed   By: Yetta Glassman M.D.   On: 01/13/2023 13:31   Result Date: 01/13/2023 CLINICAL DATA:  Aortic Stenosis EXAM: Cardiac TAVR CT TECHNIQUE: The patient was scanned on a Siemens Force [id number] slice scanner. A 120 kV retrospective scan was triggered in the ascending thoracic aorta at 140 HU's. Gantry rotation speed was 250 msecs and collimation was .6 mm. No beta blockade or nitro were given. The 3D data set was reconstructed in 5% intervals of the R-R cycle. Systolic and diastolic phases were analyzed on a dedicated work station using MPR, MIP and VRT modes. The patient received 80 cc of contrast. FINDINGS: Aortic Valve: Calcified tri leaflet AV with score 1941 Aorta: No aneurysm normal arch vessels mild calcific atherosclerosis Sino-tubular Junction: 24 mm Ascending Thoracic Aorta: 29 mm Aortic Arch: 23 mm Descending Thoracic Aorta: 24 mm Sinus of Valsalva Measurements: Non-coronary: 28.07 mm Right - coronary: 28.05 mm  Height 16.8 mm Left -   coronary: 28.69 mm  Height 17.9 mm Coronary Artery Height above Annulus: Left Main: 12.47 mm above annulus Right Coronary: 12.96 mm above annulus Virtual Basal Annulus Measurements: Motion artifact measurements done in 20% phase Maximum / Minimum Diameter: 26.34 mm x 20.24 mm Perimeter: 72.45 mm Area: 384.21 mm 2 Coronary Arteries: Sufficient height above annulus for deployment Optimum Fluoroscopic Angle for Delivery: RAO 5 Cranial 8 degrees IMPRESSION: 1. Calcified tri leaflet aortic valve with score 1941 2. Annular  area of 384 mm2 suitable for a 23 mm Sapien 3 valve or alternatively a 26 mm Medtronic Evolut valve 3.  Coronary arteries sufficient height above annulus for deployment 4. Optimum angiographic angle for deployment RAO 5 Cranial 8 degrees 5.  Membranous septal length 7.9 mm Jenkins Rouge Electronically Signed: By: Jenkins Rouge M.D. On: 01/13/2023 12:34   CT ANGIO ABDOMEN PELVIS  W &/OR WO CONTRAST  Result Date: 01/13/2023 CLINICAL DATA:  Aortic valve replacement preop evaluation EXAM: CTA chest, ABDOMEN AND PELVIS WITH CONTRAST TECHNIQUE: Multidetector CT imaging of the chest, abdomen and pelvis was performed using the standard protocol during bolus administration of intravenous contrast. Multiplanar reconstructed images and MIPs were obtained and reviewed to evaluate the vascular anatomy. RADIATION DOSE REDUCTION: This exam was performed according to the departmental dose-optimization program which includes automated exposure control, adjustment of the mA and/or kV according to patient size and/or use of iterative reconstruction technique. CONTRAST:  13mL OMNIPAQUE IOHEXOL 350 MG/ML SOLN COMPARISON:  CT abdomen and pelvis dated January 14, 2017 FINDINGS: CTA CHEST FINDINGS Cardiovascular: Normal heart size. Pericardial effusion. Normal caliber thoracic aorta with mild atherosclerotic disease. Aortic valve thickening and calcifications. Mitral annular calcifications. Mediastinum/Nodes: Esophagus and thyroid are unremarkable. Pathologically enlarged lymph nodes seen in the chest. Lungs/Pleura: Central airways are patent. No consolidation, pleural effusion or pneumothorax. Small solid pulmonary nodules measuring 3 mm in the right middle lobe on series 5, image 64 and 3 mm in the left upper lobe on image 68. Musculoskeletal: No chest wall abnormality. No  acute or significant osseous findings. CTA ABDOMEN AND PELVIS FINDINGS Hepatobiliary: No focal liver abnormality is seen. No gallstones, gallbladder wall thickening,  or biliary dilatation. Pancreas: Unremarkable. No pancreatic ductal dilatation or surrounding inflammatory changes. Spleen: Normal in size without focal abnormality. 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 Right adrenal gland is unremarkable. No hydronephrosis nephrolithiasis. No suspicious renal lesions. Bladder is unremarkable. Stomach/Bowel: Stomach is within normal limits. Small duodenal diverticulum. Appendix appears normal. No evidence of bowel wall thickening, distention, or inflammatory changes. Vascular/lymphatic: Normal caliber abdominal aorta with mild atherosclerotic disease. No significant stenosis. No pathologically enlarged lymph nodes seen in the abdomen or pelvis. Reproductive: Uterus and bilateral adnexa are unremarkable. Other: Small fat containing left inguinal hernia. Prior ventral abdominal wall hernia repair. No abdominopelvic ascites. Musculoskeletal: No acute or significant osseous findings. VASCULAR MEASUREMENTS PERTINENT TO TAVR: AORTA: Minimal Aortic Diameter-12.8 mm Severity of Aortic Calcification-mild RIGHT PELVIS: Right Common Iliac Artery - Minimal Diameter-8.0 mm Tortuosity-moderate Calcification-mild Right External Iliac Artery - Minimal Diameter-7.0 mm Tortuosity-mild Calcification-none Right Common Femoral Artery - Minimal Diameter-6.9 mm Tortuosity-mild Calcification-none LEFT PELVIS: Left Common Iliac Artery - Minimal Diameter-7.8 mm Tortuosity-mild Calcification-mild Left External Iliac Artery - Minimal Diameter-6.1 mm Tortuosity-mild Calcification-none Left Common Femoral Artery - Minimal Diameter-6.8 mm Tortuosity-mild Calcification-none Review of the MIP images confirms the above findings. IMPRESSION: 1. Vascular findings and measurements pertinent to potential TAVR procedure, as detailed above. 2. Thickening and calcification of the aortic valve,  compatible with reported clinical history of aortic stenosis. 3. Mild aortoiliac atherosclerosis. 4. Small solid pulmonary nodules measuring up to 3 mm. If the patient is high-risk, a non-contrast chest CT can be considered in 12 months. If the patient is low risk, no further follow-up imaging is needed. This recommendation follows the consensus statement: Guidelines for Management of Incidental Pulmonary Nodules Detected on CT Images: From the Fleischner Society 2017; Radiology 2017; 284:228-243. Electronically Signed   By: Yetta Glassman M.D.   On: 01/13/2023 13:30   CT ANGIO CHEST AORTA W/CM & OR WO/CM  Result Date: 01/13/2023 CLINICAL DATA:  Aortic valve replacement preop evaluation EXAM: CTA chest, ABDOMEN AND PELVIS WITH CONTRAST TECHNIQUE: Multidetector CT imaging of the chest, abdomen and pelvis was performed using the standard protocol during bolus administration of intravenous contrast. Multiplanar reconstructed images and MIPs were obtained and reviewed to evaluate the vascular anatomy. RADIATION DOSE REDUCTION: This exam was performed according to the departmental dose-optimization program which includes automated exposure control, adjustment of the mA and/or kV according to patient size and/or use of iterative reconstruction technique. CONTRAST:  134mL OMNIPAQUE IOHEXOL 350 MG/ML SOLN COMPARISON:  CT abdomen and pelvis dated January 14, 2017 FINDINGS: CTA CHEST FINDINGS Cardiovascular: Normal heart size. Pericardial effusion. Normal caliber thoracic aorta with mild atherosclerotic disease. Aortic valve thickening and calcifications. Mitral annular calcifications. Mediastinum/Nodes: Esophagus and thyroid are unremarkable. Pathologically enlarged lymph nodes seen in the chest. Lungs/Pleura: Central airways are patent. No consolidation, pleural effusion or pneumothorax. Small solid pulmonary nodules measuring 3 mm in the right middle lobe on series 5, image 64 and 3 mm in the left upper lobe on image  68. Musculoskeletal: No chest wall abnormality. No acute or significant osseous findings. CTA ABDOMEN AND PELVIS FINDINGS Hepatobiliary: No focal liver abnormality is seen. No gallstones, gallbladder wall thickening, or biliary dilatation. Pancreas: Unremarkable. No pancreatic ductal dilatation or surrounding inflammatory changes. Spleen: Normal in size without focal abnormality. 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 Right adrenal gland is unremarkable. No hydronephrosis nephrolithiasis. No suspicious renal lesions. Bladder is unremarkable. Stomach/Bowel: Stomach is within normal limits. Small duodenal diverticulum. Appendix appears normal. No evidence of bowel wall thickening, distention, or inflammatory changes. Vascular/lymphatic: Normal caliber abdominal aorta with mild atherosclerotic disease. No significant stenosis. No pathologically enlarged lymph nodes seen in the abdomen or pelvis. Reproductive: Uterus and bilateral adnexa are unremarkable. Other: Small fat containing left inguinal hernia. Prior ventral abdominal wall hernia repair. No abdominopelvic ascites. Musculoskeletal: No acute or significant osseous findings. VASCULAR MEASUREMENTS PERTINENT TO TAVR: AORTA: Minimal Aortic Diameter-12.8 mm Severity of Aortic Calcification-mild RIGHT PELVIS: Right Common Iliac Artery - Minimal Diameter-8.0 mm Tortuosity-moderate Calcification-mild Right External Iliac Artery - Minimal Diameter-7.0 mm Tortuosity-mild Calcification-none Right Common Femoral Artery - Minimal Diameter-6.9 mm Tortuosity-mild Calcification-none LEFT PELVIS: Left Common Iliac Artery - Minimal Diameter-7.8 mm Tortuosity-mild Calcification-mild Left External Iliac Artery - Minimal Diameter-6.1 mm Tortuosity-mild Calcification-none Left Common Femoral Artery - Minimal Diameter-6.8 mm Tortuosity-mild Calcification-none Review  of the MIP images confirms the above findings. IMPRESSION: 1. Vascular findings and measurements pertinent to potential TAVR procedure, as detailed above. 2. Thickening and calcification of the aortic valve, compatible with reported clinical history of aortic stenosis. 3. Mild aortoiliac atherosclerosis. 4. Small solid pulmonary nodules measuring up to 3 mm. If the patient is high-risk, a non-contrast chest CT can be considered in 12 months. If the patient is low risk, no further follow-up imaging is needed. This recommendation follows the consensus statement: Guidelines for Management of Incidental Pulmonary Nodules Detected on CT Images: From the Fleischner Society 2017; Radiology 2017; 284:228-243. Electronically Signed   By: Yetta Glassman M.D.   On: 01/13/2023 13:30   CARDIAC CATHETERIZATION  Result Date: 01/08/2023   Prox LAD to Mid LAD lesion is 40% stenosed.   There is severe aortic valve stenosis. 1.  Patent coronary arteries with mild diffuse nonobstructive plaquing, mid LAD with 40% stenosis, widely patent left main, left circumflex, and RCA 2.  Severe calcific aortic stenosis with mean transvalvular gradient 61 mmHg and calculated aortic valve area 0.63 cm 3.  Mildly increased right heart diastolic filling pressures with increased pulmonary wedge pressure of 20 mmHg and preserved cardiac output of 4.74 L/min Recommendations: Medical therapy for mild nonobstructive CAD, continue evaluation for aortic valve replacement.   Disposition   Pt is being discharged home today in good condition.  Follow-up Plans & Appointments    Follow-up Information     Mallipeddi, Quenten Raven, MD Follow up on 02/14/2023.   Specialties: Cardiology, Internal Medicine Why: @ 840am. Please arrive at 820am. Contact information: [address] [place] [address] [phone]                Discharge Instructions     Amb Referral to Cardiac Rehabilitation   Complete by: As directed    Diagnosis: Valve  Replacement   Valve: Aortic   After initial evaluation and assessments completed: Virtual Based Care may be provided alone or in conjunction with Phase 2 Cardiac Rehab based on patient barriers.: Yes   Intensive Cardiac Rehabilitation (ICR) [place] location only OR Traditional Cardiac Rehabilitation (TCR) *If criteria for ICR are not met will enroll in TCR [facility] only): Yes   Call MD for:  difficulty breathing, headache or visual disturbances   Complete by: As directed    Call MD for:  extreme fatigue  Complete by: As directed    Call MD for:  hives   Complete by: As directed    Call MD for:  persistant dizziness or light-headedness   Complete by: As directed    Call MD for:  persistant nausea and vomiting   Complete by: As directed    Call MD for:  redness, tenderness, or signs of infection (pain, swelling, redness, odor or green/yellow discharge around incision site)   Complete by: As directed    Call MD for:  severe uncontrolled pain   Complete by: As directed    Call MD for:  temperature >100.4   Complete by: As directed    Diet - low sodium heart healthy   Complete by: As directed    Discharge instructions   Complete by: As directed    ACTIVITY AND EXERCISE  Daily activity and exercise are an important part of your recovery. People recover at different rates depending on their general health and type of valve procedure.  Most people recovering from TAVR feel better relatively quickly   No lifting, pushing, pulling more than 10 pounds (examples to avoid: groceries, vacuuming, gardening, golfing):             - For one week with a procedure through the groin.             - For six weeks for procedures through the chest wall or neck. NOTE: You will typically see one of our providers 7-14 days after your procedure to discuss [place] the above activities.      DRIVING  Do not drive until you are seen for follow up and cleared by a provider. Generally, we ask patient to not  drive for 1 week after their procedure.  If you have been told by your doctor in the past that you may not drive, you must talk with him/her before you begin driving again.   DRESSING  Groin site: you may leave the clear dressing over the site for up to one week or until it falls off.   HYGIENE  If you had a femoral (leg) procedure, you may take a shower when you return home. After the shower, pat the site dry. Do NOT use powder, oils or lotions in your groin area until the site has completely healed.  If you had a chest procedure, you may shower when you return home unless specifically instructed not to by your discharging practitioner.             - DO NOT scrub incision; pat dry with a towel.             - DO NOT apply any lotions, oils, powders to the incision.             - No tub baths / swimming for at least 2 weeks.  If you notice any fevers, chills, increased pain, swelling, bleeding or pus, please contact your doctor.   ADDITIONAL INFORMATION  If you are going to have an upcoming dental procedure, please contact our office as you will require antibiotics ahead of time to prevent infection on your heart valve.    If you have any questions or concerns you can call the structural heart phone during normal business hours 8am-4pm. If you have an urgent need after hours or weekends please call [phone] to talk to the on call provider for general cardiology. If you have an emergency that requires immediate attention, please call 911.  After TAVR Checklist  Check  Test Description  Follow up appointment in 1-2 weeks  You will see our structural heart advanced practice provider. Your incision sites will be checked and you will be cleared to drive and resume all normal activities if you are doing well.    1 month echo and follow up  You will have an echo to check on your new heart valve and be seen back in the office by a structural heart advanced practice provider.  Follow up  with your primary cardiologist You will need to be seen by your primary cardiologist in the following 3-6 months after your 1 month appointment in the valve clinic.   1 year echo and follow up You will have another echo to check on your heart valve after 1 year and be seen back in the office by a structural heart advanced practice provider. This your last structural heart visit.  Bacterial endocarditis prophylaxis  You will have to take antibiotics for the rest of your life before all dental procedures (even teeth cleanings) to protect your heart valve. Antibiotics are also required before some surgeries. Please check with your cardiologist before scheduling any surgeries. Also, please make sure to tell us if you have a penicillin allergy as you will require an alternative antibiotic.   Increase activity slowly   Complete by: As directed       Discharge Medications   Allergies as of 02/06/2023       Reactions   Statins Other (See Comments)   Failed simvastatin, pravastatin, and rosuvastatin 5 mg weekly (myalgias)   Cinnamon    Dry throat         Medication List     TAKE these medications    albuterol 108 (90 Base) MCG/ACT inhaler Commonly known as: VENTOLIN HFA Inhale 2 puffs into the lungs every 4 (four) hours as needed for wheezing or shortness of breath.   aspirin 81 MG chewable tablet Chew 1 tablet (81 mg total) by mouth daily. Start taking on: February 07, 2023   Azelastine HCl 137 MCG/SPRAY Soln Place 1 spray into both nostrils daily as needed (allergies/sinus issues.).   Capsaicin 0.025 % Lotn Apply 1 Application topically 4 (four) times daily as needed. Apply to the affected area 4 times daily as needed for itching.   clobetasol 0.05 % external solution Commonly known as: TEMOVATE Apply 1 application topically 2 (two) times daily. What changed:  when to take this reasons to take this   Cosentyx Sensoready (300 MG) 150 MG/ML Soaj Generic drug: Secukinumab (300 MG  Dose) Inject 300 mg into the skin every 30 (thirty) days.   diclofenac 1.3 % Ptch Commonly known as: FLECTOR Place 1 patch onto the skin 2 (two) times daily. What changed:  when to take this reasons to take this   DULoxetine 30 MG capsule Commonly known as: CYMBALTA TAKE 1 CAPSULE(30 MG) BY MOUTH DAILY What changed: See the new instructions.   esomeprazole 20 MG capsule Commonly known as: NEXIUM Take 20 mg by mouth in the morning and at bedtime.   freestyle lancets Use as instructed   FREESTYLE LITE test strip Generic drug: glucose blood Use as instructed to monitor FSBS 1x daily. Dx: E11.9   furosemide 20 MG tablet Commonly known as: LASIX Take 1 tablet (20 mg total) by mouth daily as needed.   gabapentin 600 MG tablet Commonly known as: NEURONTIN TAKE 2 TABLETS BY MOUTH IN THE MORNING AND AT NIGHT   glucose  monitoring kit monitoring kit 1 each by Does not apply route as needed for other. Dispense one glucometer of choice, testing strips/lancets for once per day glucose checks.  QS for 1 month, 11 refills.   lisinopril 10 MG tablet Commonly known as: ZESTRIL Take 10 mg by mouth every evening.   metoprolol succinate 50 MG 24 hr tablet Commonly known as: TOPROL-XL Take 1 tablet (50 mg total) by mouth daily. Take with or immediately following a meal. Start taking on: February 07, 2023 What changed:  medication strength how much to take additional instructions   mometasone 50 MCG/ACT nasal spray Commonly known as: NASONEX Place 2 sprays into the nose daily as needed (allergies).   Repatha SureClick [id number] MG/ML Soaj Generic drug: Evolocumab Inject 140 mg [place] every two weeks   Semaglutide (2 MG/DOSE) 8 MG/3ML Sopn Inject 2 mg as directed once a week. What changed: when to take this        Outstanding Labs/Studies   None   Duration of Discharge Encounter   Greater than 30 minutes including physician time.  SignedKathyrn Drown, NP 02/06/2023, 12:32  PM [phone]   Patient seen, examined. Available data reviewed. Agree with findings, assessment, and plan as outlined by Kathyrn Drown, NP.  The patient is independently interviewed and examined.  Her husband is at bedside.  She is alert, oriented, in no distress.  She had a mildly increased temperature this morning of 100.2.  I rechecked her temperature at the bedside and it is currently 98.5.  She is feeling okay with no specific complaints.  Lungs are clear bilaterally, heart is regular rate and rhythm with a 2/6 systolic murmur most prominent at the left lower sternal border, abdomen soft and nontender, bilateral groin sites are clear with mild ecchymoses over the right groin but no hematoma or tenderness.  Lower extremities with no edema.  Reviewed her postoperative day #1 echo which shows normal function of her TAVR prosthesis with a mean gradient of 15 mmHg.  There is hyperdynamic LV function with mitral valve SAM and dynamic LV outflow tract/mid cavitary gradient with peak velocity 3.9 m/s.  The patient appears medically stable for discharge.  I am going to increase her metoprolol succinate to 50 mg daily.  Otherwise I agree with her current medical regimen as outlined.  Discussed post TAVR restrictions and follow-up instructions.  All questions were answered.  Sherren Mocha, M.D. 02/06/2023 12:32 PM

## 2023-02-05 NOTE — Progress Notes (Addendum)
LOCATION: right RADIAL   DRESSING APPLIED:  a-line removed and drsg placed at 1131, gauze with tegaderm placed, manual pressure held for approximately 5-6 minutes  SITE UPON ARRIVAL: LEVEL 0  SITE AFTER BAND REMOVAL: LEVEL 0  CIRCULATION SENSATION AND MOVEMENT: +2 radial pulse noted and + movement present   COMMENTS:

## 2023-02-05 NOTE — Progress Notes (Signed)
Patient ambulated to bathroom and back to bed without difficulty.  Vital signs stable. Groin sites stable.

## 2023-02-05 NOTE — Op Note (Signed)
[facility]   MULTIDISCIPLINARY HEART VALVE TEAM   TAVR OPERATIVE NOTE   Date of Procedure:  02/05/2023  Preoperative Diagnosis: Severe Aortic Stenosis   Postoperative Diagnosis: Same   Procedure:   Transcatheter Aortic Valve Replacement - Percutaneous Right Transfemoral Approach  Edwards Sapien 3 Ultra THV (size 23 mm, model # 9755RSL, serial # [id number])   Co-Surgeons:  Coralie Common MD and Sherren Mocha, MD    Anesthesiologist:  Rochele Pages, DO  Echocardiographer:  Dr Chauncey Cruel  Pre-operative Echo Findings: Severe aortic stenosis normal left ventricular systolic function  Post-operative Echo Findings: no paravalvular leak normal left ventricular systolic function   BRIEF CLINICAL NOTE AND INDICATIONS FOR SURGERY  Pt is a 72 yo with NYHA class 2 symptoms of severe AS with a hypertophic septum and normal LV function and nonobstructive CAD. Pt should proceed with AVR for her symptomatic AS and with her age we discussed the option for TAVR and after all the risks and benefits were discussed, she wishes to proceed with TAVR     DETAILS OF THE OPERATIVE PROCEDURE  PREPARATION:    The patient was brought to the operating room on the above mentioned date and appropriate monitoring was established by the anesthesia team. The patient was placed in the supine position on the operating table.  Intravenous antibiotics were administered. The patient was monitored closely throughout the procedure under conscious sedation.  Baseline transthoracic echocardiogram was performed. The patient's abdomen and both groins were prepped and draped in a sterile manner. A time out procedure was performed.   PERIPHERAL ACCESS:    Using the modified Seldinger technique, femoral arterial and venous access was obtained with placement of 6 Fr sheaths on the left side.  A pigtail diagnostic catheter was passed through the left arterial sheath under fluoroscopic guidance into the  aortic root.  A temporary transvenous pacemaker catheter was passed through the left femoral venous sheath under fluoroscopic guidance into the right ventricle.  The pacemaker was tested to ensure stable lead placement and pacemaker capture. Aortic root angiography was performed in order to determine the optimal angiographic angle for valve deployment.   TRANSFEMORAL ACCESS:   Percutaneous transfemoral access and sheath placement was performed using ultrasound guidance.  The right common femoral artery was cannulated using a micropuncture needle and appropriate location was verified using hand injection angiogram.  A pair of Abbott Perclose percutaneous closure devices were placed and a 6 French sheath replaced into the femoral artery.  The patient was heparinized systemically and ACT verified > 250 seconds.    A 14 Fr transfemoral E-sheath was introduced into the left common femoral artery after progressively dilating over an Amplatz superstiff wire. An AL2 catheter was used to direct a straight-tip exchange length wire across the native aortic valve into the left ventricle. This was exchanged out for a pigtail catheter and position was confirmed in the LV apex. Simultaneous LV and Ao pressures were recorded.  The pigtail catheter was exchanged for a Safari wire in the LV apex.   BALLOON AORTIC VALVULOPLASTY:   Not performed    TRANSCATHETER HEART VALVE DEPLOYMENT:   An Edwards Sapien 3 Ultra transcatheter heart valve (size 23 mm) was prepared and crimped per manufacturer's guidelines, and the proper orientation of the valve is confirmed on the [employer] delivery system. The valve was advanced through the introducer sheath using normal technique until in an appropriate position in the abdominal aorta beyond the sheath tip. The balloon was then retracted  and using the fine-tuning wheel was centered on the valve. The valve was then advanced across the aortic arch using appropriate flexion of  the catheter. The valve was carefully positioned across the aortic valve annulus. The Commander catheter was retracted using normal technique. Once final position of the valve has been confirmed by angiographic assessment, the valve is deployed during rapid ventricular pacing to maintain systolic blood pressure < 50 mmHg and pulse pressure < 10 mmHg. The balloon inflation is held for >3 seconds after reaching full deployment volume. Once the balloon has fully deflated the balloon is retracted into the ascending aorta and valve function is assessed using echocardiography. There is felt to be no paravalvular leak and no central aortic insufficiency.  The patient's hemodynamic recovery following valve deployment is good.  The deployment balloon and guidewire are both removed.    PROCEDURE COMPLETION:   The sheath was removed and femoral artery closure performed.  Protamine was administered once femoral arterial repair was complete. The temporary pacemaker, pigtail catheter and femoral sheaths were removed with manual pressure used for venous hemostasis.  A Mynx femoral closure device was utilized following removal of the diagnostic sheath in the left femoral artery.  The patient tolerated the procedure well and is transported to the cath lab recovery area in stable condition. There were no immediate intraoperative complications. All sponge instrument and needle counts are verified correct at completion of the operation.   No blood products were administered during the operation.  The patient received a total of 40 mL of intravenous contrast during the procedure.   Coralie Common, MD 02/05/2023 10:12 AM

## 2023-02-05 NOTE — Progress Notes (Signed)
  Echocardiogram 2D Echocardiogram has been performed.  Abigail [name] 02/05/2023, 10:03 AM

## 2023-02-05 NOTE — Progress Notes (Signed)
Mobility Specialist: Progress Note   02/05/23 1622  Mobility  Activity Ambulated with assistance in hallway  Level of Assistance Standby assist, set-up cues, supervision of patient - no hands on  Assistive Device None  Distance Ambulated (ft) 200 ft  Activity Response Tolerated well  Mobility Referral Yes  $Mobility charge 1 Mobility   Pre-Mobility: 74 HR, 94% SpO2 Post-Mobility: 92 HR, 133/57 (79) BP, 94% SpO2  Received pt in bed having no complaints and agreeable to mobility. Pt was asymptomatic throughout ambulation and returned to room w/o fault. Left in bed w/ call bell in reach and all needs met.  [place] Luz Burcher Mobility Specialist Please contact via SecureChat or Rehab office at [phone]

## 2023-02-05 NOTE — Interval H&P Note (Signed)
History and Physical Interval Note:  02/05/2023 6:19 AM  Abigail [name]  has presented today for surgery, with the diagnosis of Severe Aortic Stenosis.  The various methods of treatment have been discussed with the patient and family. After consideration of risks, benefits and other options for treatment, the patient has consented to  Procedure(s): Transcatheter Aortic Valve Replacement, Transfemoral (Right) INTRAOPERATIVE TRANSTHORACIC ECHOCARDIOGRAM (N/A) as a surgical intervention.  The patient's history has been reviewed, patient examined, no change in status, stable for surgery.  I have reviewed the patient's chart and labs.  Questions were answered to the patient's satisfaction.     Coralie Common

## 2023-02-05 NOTE — Transfer of Care (Signed)
Immediate Anesthesia Transfer of Care Note  Patient: Abigail [name]  Procedure(s) Performed: Transcatheter Aortic Valve Replacement, Transfemoral (Right) INTRAOPERATIVE TRANSTHORACIC ECHOCARDIOGRAM  Patient Location: Cath Lab  Anesthesia Type:MAC  Level of Consciousness: drowsy and patient cooperative  Airway & Oxygen Therapy: Patient Spontanous Breathing and Patient connected to face mask oxygen  Post-op Assessment: Report given to RN and Post -op Vital signs reviewed and stable  Post vital signs: Reviewed and stable  Last Vitals:  Vitals Value Taken Time  BP    Temp    Pulse    Resp    SpO2      Last Pain:  Vitals:   02/05/23 0648  TempSrc:   PainSc: 0-No pain         Complications: There were no known notable events for this encounter.

## 2023-02-05 NOTE — Anesthesia Postprocedure Evaluation (Signed)
Anesthesia Post Note  Patient: Kristine Royal Ice  Procedure(s) Performed: Transcatheter Aortic Valve Replacement, Transfemoral (Right) INTRAOPERATIVE TRANSTHORACIC ECHOCARDIOGRAM     Patient location during evaluation: PACU Anesthesia Type: MAC Level of consciousness: awake and alert Pain management: pain level controlled Vital Signs Assessment: post-procedure vital signs reviewed and stable Respiratory status: spontaneous breathing, nonlabored ventilation, respiratory function stable and patient connected to nasal cannula oxygen Cardiovascular status: stable and blood pressure returned to baseline Postop Assessment: no apparent nausea or vomiting Anesthetic complications: no   There were no known notable events for this encounter.  Last Vitals:  Vitals:   02/05/23 1200 02/05/23 1215  BP: (!) 94/43 (!) 87/47  Pulse: 64 (!) 59  Resp: 16 17  Temp: 36.5 C   SpO2: 92% 91%    Last Pain:  Vitals:   02/05/23 1200  TempSrc: Oral  PainSc: 0-No pain                 Belenda Cruise P Amirrah Quigley

## 2023-02-05 NOTE — Progress Notes (Signed)
Sharee Pimple, NP at bedside, aware of pt's K+ level and current BP's, no new orders at this time, will order med for K+ level later, safety maintained

## 2023-02-05 NOTE — Progress Notes (Signed)
  [facility]   MULTIDISCIPLINARY HEART VALVE TEAM  Patient doing well s/p TAVR. She is hemodynamically stable. Groin sites stable. ECG with no high grade block. Arterial line discontinued and transferred to 4E.  Plan for early ambulation after bedrest completed and hopeful discharge over the next 24-48 hours.   Abigail Nappier NP-C Structural Heart Team  Pager: [phone] Phone: [phone]  

## 2023-02-06 ENCOUNTER — Inpatient Hospital Stay (HOSPITAL_COMMUNITY): Payer: Medicare Other

## 2023-02-06 ENCOUNTER — Encounter (HOSPITAL_COMMUNITY): Payer: Self-pay | Admitting: Cardiovascular Disease

## 2023-02-06 DIAGNOSIS — Z952 Presence of prosthetic heart valve: Secondary | ICD-10-CM

## 2023-02-06 LAB — CBC
HCT: 34.7 % — ABNORMAL LOW (ref 36.0–46.0)
Hemoglobin: 11.9 g/dL — ABNORMAL LOW (ref 12.0–15.0)
MCH: 29.8 pg (ref 26.0–34.0)
MCHC: 34.3 g/dL (ref 30.0–36.0)
MCV: 86.8 fL (ref 80.0–100.0)
Platelets: 141 10*3/uL — ABNORMAL LOW (ref 150–400)
RBC: 4 MIL/uL (ref 3.87–5.11)
RDW: 12.9 % (ref 11.5–15.5)
WBC: 7.4 10*3/uL (ref 4.0–10.5)
nRBC: 0 % (ref 0.0–0.2)

## 2023-02-06 LAB — BASIC METABOLIC PANEL
Anion gap: 10 (ref 5–15)
BUN: 8 mg/dL (ref 8–23)
CO2: 24 mmol/L (ref 22–32)
Calcium: 8.3 mg/dL — ABNORMAL LOW (ref 8.9–10.3)
Chloride: 102 mmol/L (ref 98–111)
Creatinine, Ser: 0.8 mg/dL (ref 0.44–1.00)
GFR, Estimated: >60 mL/min (ref >60)
Glucose, Bld: 173 mg/dL — ABNORMAL HIGH (ref 70–99)
Potassium: 3.5 mmol/L (ref 3.5–5.1)
Sodium: 136 mmol/L (ref 135–145)

## 2023-02-06 LAB — GLUCOSE, CAPILLARY
Glucose-Capillary: 103 mg/dL — ABNORMAL HIGH (ref 70–99)
Glucose-Capillary: 126 mg/dL — ABNORMAL HIGH (ref 70–99)
Glucose-Capillary: 139 mg/dL — ABNORMAL HIGH (ref 70–99)

## 2023-02-06 LAB — ECHOCARDIOGRAM COMPLETE
AV Mean grad: 15 mmHg
AV Peak grad: 26.6 mmHg
Ao pk vel: 2.58 m/s
Area-P 1/2: 4.1 cm2
Calc EF: 76.8 %
Est EF: >75
Height: 62 in
S' Lateral: 1.7 cm
Single Plane A2C EF: 73.7 %
Single Plane A4C EF: 79.1 %
Weight: 2948.8 oz

## 2023-02-06 LAB — KAPPA/LAMBDA LIGHT CHAINS
Kappa free light chain: 20.6 mg/L — ABNORMAL HIGH (ref 3.3–19.4)
Kappa, lambda light chain ratio: 1.54 (ref 0.26–1.65)
Lambda free light chains: 13.4 mg/L (ref 5.7–26.3)

## 2023-02-06 LAB — MAGNESIUM: Magnesium: 1.7 mg/dL (ref 1.7–2.4)

## 2023-02-06 MED ORDER — METOPROLOL SUCCINATE ER 50 MG PO TB24: 50.0000 mg | ORAL_TABLET | Freq: Every day | ORAL | Status: DC

## 2023-02-06 MED ORDER — METOPROLOL SUCCINATE ER 50 MG PO TB24: 50.0000 mg | ORAL_TABLET | Freq: Every day | ORAL | 3 refills | Status: DC

## 2023-02-06 MED ORDER — METOPROLOL SUCCINATE ER 25 MG PO TB24
25.0000 mg | ORAL_TABLET | Freq: Every day | ORAL | Status: DC
  Administered 2023-02-06: 25 mg via ORAL
  Filled 2023-02-06: qty 1

## 2023-02-06 MED ORDER — ASPIRIN 81 MG PO CHEW: 81.0000 mg | CHEWABLE_TABLET | Freq: Every day | ORAL | Status: AC

## 2023-02-06 NOTE — Progress Notes (Addendum)
CARDIAC REHAB PHASE I   PRE:  Rate/Rhythm: 92 NSR  BP:  Sitting: 144/61      SaO2: RA  MODE:  Ambulation: 240 ft   AD:  None  POST:  Rate/Rhythm: 107 ST  BP:  Sitting: 160/70      SaO2: RA   Pt walked with standby assist w/o issue.  Pt educated on restrictions, heart healthy diet, diabetic diet, importance of exercise, and mobility. Pt wants to be referred to [facility].   Abigail [name]  11:19 AM 02/06/2023

## 2023-02-07 ENCOUNTER — Ambulatory Visit: Payer: Self-pay

## 2023-02-07 ENCOUNTER — Telehealth: Payer: Self-pay | Admitting: *Deleted

## 2023-02-07 ENCOUNTER — Telehealth: Payer: Self-pay | Admitting: Family

## 2023-02-07 ENCOUNTER — Telehealth: Payer: Self-pay | Admitting: Cardiology

## 2023-02-07 DIAGNOSIS — E1149 Type 2 diabetes mellitus with other diabetic neurological complication: Secondary | ICD-10-CM

## 2023-02-07 NOTE — Addendum Note (Signed)
Addended by: Gabriel Rainwater on: 02/07/2023 03:32 PM   Modules accepted: Orders

## 2023-02-07 NOTE — Telephone Encounter (Signed)
Refill for Semaglutide, 2 MG/DOSE, 8 MG/3ML SOPN   LR- 08/02/22 (62ml/ 1 refill)- Dr Selena Batten LV- 11/08/22 NV- 02/10/23

## 2023-02-07 NOTE — Telephone Encounter (Signed)
  [facility]   MULTIDISCIPLINARY HEART VALVE TEAM   Patient contacted regarding discharge from [facility] on 02/06/23   Patient understands to follow up with provider Dr. Jenene Slicker on 4/12  Patient understands discharge instructions? Yes  Patient understands medications and regimen? Yes  Patient understands to bring all medications to this visit? Yes   Georgie Chard NP-C Structural Heart Team  Pager: [phone]

## 2023-02-07 NOTE — Chronic Care Management (AMB) (Signed)
   02/07/2023  Aldine Wedekind [facility] 1950-12-16 [id number]   Reason for Encounter: Patient is not currently enrolled in the CCM program. CCM status changed to previously enrolled.   Katha Cabal RN Care Manager/Chronic Care Management [phone]

## 2023-02-07 NOTE — Telephone Encounter (Signed)
Patient called in and had some questions regarding if she needs to refill her Semaglutide, 2 MG/DOSE, 8 MG/3ML SOPN. She stated she wanted to check before calling the pharmacy. Thank you!

## 2023-02-07 NOTE — Transitions of Care (Post Inpatient/ED Visit) (Signed)
   02/07/2023  Name: Abigail [name] MRN: [id number] DOB: 1951/04/11  Today's TOC FU Call Status: Today's TOC FU Call Status:: Successful TOC FU Call Competed TOC FU Call Complete Date: 02/07/23  Transition Care Management Follow-up Telephone Call Date of Discharge: 02/06/23 Discharge Facility: [facility]) Type of Discharge: Inpatient Admission Primary Inpatient Discharge Diagnosis:: Transcatheter aortic valve replacement How have you been since you were released from the hospital?: Better Any questions or concerns?: Yes Patient Questions/Concerns:: Do I pull these strips off. Patient Questions/Concerns Addressed: Other: (RN went over incision care.)  Items Reviewed: Did you receive and understand the discharge instructions provided?: Yes Medications obtained and verified?: Yes (Medications Reviewed) Any new allergies since your discharge?: No Dietary orders reviewed?: No Do you have support at home?: Yes People in Home: spouse Name of Support/Comfort Primary Source: [facility] and Equipment/Supplies: Were Home Health Services Ordered?: No Any new equipment or medical supplies ordered?: No  Functional Questionnaire: Do you need assistance with bathing/showering or dressing?: No Do you need assistance with meal preparation?: No Do you need assistance with eating?: No Do you need assistance with getting out of bed/getting out of a chair/moving?: No Do you have difficulty managing or taking your medications?: No  Follow up appointments reviewed: PCP Follow-up appointment confirmed?: NA Specialist Hospital Follow-up appointment confirmed?: Yes Date of Specialist follow-up appointment?: 02/14/23 Follow-Up Specialty Provider:: Dr Jenene Slicker 8:40 Do you need transportation to your follow-up appointment?: No Do you understand care options if your condition(s) worsen?: Yes-patient verbalized understanding  SDOH Interventions Today    Flowsheet Row Most Recent Value  SDOH  Interventions   Food Insecurity Interventions Intervention Not Indicated  Housing Interventions Intervention Not Indicated  Transportation Interventions Intervention Not Indicated      Interventions Today    Flowsheet Row Most Recent Value  General Interventions   General Interventions Discussed/Reviewed General Interventions Discussed, General Interventions Reviewed, Referral to Nurse, Doctor Visits  [referred to Care Coordinator [name] for disease management]  Doctor Visits Discussed/Reviewed Doctor Visits Discussed, Doctor Visits Reviewed, Specialist  PCP/Specialist Visits Compliance with follow-up visit  Exercise Interventions   Exercise Discussed/Reviewed Exercise Discussed, Exercise Reviewed  [Patient has declined the cardiac rehab for now. [place]]  Education Interventions   Education Provided Provided Education  [Groin site: you may leave the clear dressing over the site for up to one week or until it falls off.DO NOT scrub incision,  pat dry with a towel.  - DO NOT apply any lotions, oils, powders to the incision.  - No tub baths / swimming for at least 2 weeks]      Referred to [name] for Disease Management call [phone] 10:45  Gean Maidens BSN RN [facility] Care Management [phone]

## 2023-02-10 ENCOUNTER — Ambulatory Visit (INDEPENDENT_AMBULATORY_CARE_PROVIDER_SITE_OTHER): Payer: Medicare Other

## 2023-02-10 VITALS — Ht 62.0 in | Wt 180.0 lb

## 2023-02-10 DIAGNOSIS — Z0000 Encounter for general adult medical examination without abnormal findings: Secondary | ICD-10-CM | POA: Diagnosis not present

## 2023-02-10 LAB — MULTIPLE MYELOMA PANEL, SERUM
Albumin SerPl Elph-Mcnc: 3.3 g/dL (ref 2.9–4.4)
Albumin/Glob SerPl: 1.7 (ref 0.7–1.7)
Alpha 1: 0.1 g/dL (ref 0.0–0.4)
Alpha2 Glob SerPl Elph-Mcnc: 0.5 g/dL (ref 0.4–1.0)
B-Globulin SerPl Elph-Mcnc: 0.8 g/dL (ref 0.7–1.3)
Gamma Glob SerPl Elph-Mcnc: 0.5 g/dL (ref 0.4–1.8)
Globulin, Total: 2 g/dL — ABNORMAL LOW (ref 2.2–3.9)
IgA: 253 mg/dL (ref 64–422)
IgG (Immunoglobin G), Serum: 566 mg/dL — ABNORMAL LOW (ref 586–1602)
IgM (Immunoglobulin M), Srm: 84 mg/dL (ref 26–217)
Total Protein ELP: 5.3 g/dL — ABNORMAL LOW (ref 6.0–8.5)

## 2023-02-10 MED ORDER — SEMAGLUTIDE (2 MG/DOSE) 8 MG/3ML ~~LOC~~ SOPN
2.0000 mg | PEN_INJECTOR | SUBCUTANEOUS | 1 refills | Status: DC
Start: 2023-02-10 — End: 2023-04-25

## 2023-02-10 NOTE — Patient Instructions (Signed)
Ms. Arcidiacono , Thank you for taking time to come for your Medicare Wellness Visit. I appreciate your ongoing commitment to your health goals. Please review the following plan we discussed and let me know if I can assist you in the future.   These are the goals we discussed:  Goals      Patient Stated     Stay active and spend lots of time with family     Patient Stated     Drink more water and eat healthier.        This is a list of the screening recommended for you and due dates:  Health Maintenance  Topic Date Due   DTaP/Tdap/Td vaccine (1 - Tdap) Never done   DEXA scan (bone density measurement)  01/23/2023   Zoster (Shingles) Vaccine (1 of 2) 04/16/2023*   Mammogram  01/14/2024*   Complete foot exam   05/02/2023   Hemoglobin A1C  05/09/2023   Flu Shot  06/05/2023   Colon Cancer Screening  06/06/2023   Eye exam for diabetics  07/18/2023   Yearly kidney health urinalysis for diabetes  08/03/2023   Yearly kidney function blood test for diabetes  02/06/2024   Medicare Annual Wellness Visit  02/10/2024   Pneumonia Vaccine  Completed   Hepatitis C Screening: USPSTF Recommendation to screen - Ages 18-79 yo.  Completed   HPV Vaccine  Aged Out   COVID-19 Vaccine  Discontinued  *Topic was postponed. The date shown is not the original due date.    Advanced directives: Please bring a copy of your health care power of attorney and living will to the office to be added to your chart at your convenience.   Conditions/risks identified: .a  Next appointment: Follow up in one year for your annual wellness visit 02/11/24 @ 8am telephone visit.   Preventive Care 74 Years and Older, Female Preventive care refers to lifestyle choices and visits with your health care provider that can promote health and wellness. What does preventive care include? A yearly physical exam. This is also called an annual well check. Dental exams once or twice a year. Routine eye exams. Ask your health care  provider how often you should have your eyes checked. Personal lifestyle choices, including: Daily care of your teeth and gums. Regular physical activity. Eating a healthy diet. Avoiding tobacco and drug use. Limiting alcohol use. Practicing safe sex. Taking low-dose aspirin every day. Taking vitamin and mineral supplements as recommended by your health care provider. What happens during an annual well check? The services and screenings done by your health care provider during your annual well check will depend on your age, overall health, lifestyle risk factors, and family history of disease. Counseling  Your health care provider may ask you questions about your: Alcohol use. Tobacco use. Drug use. Emotional well-being. Home and relationship well-being. Sexual activity. Eating habits. History of falls. Memory and ability to understand (cognition). Work and work Astronomer. Reproductive health. Screening  You may have the following tests or measurements: Height, weight, and BMI. Blood pressure. Lipid and cholesterol levels. These may be checked every 5 years, or more frequently if you are over 74 years old. Skin check. Lung cancer screening. You may have this screening every year starting at age 40 if you have a 30-pack-year history of smoking and currently smoke or have quit within the past 15 years. Fecal occult blood test (FOBT) of the stool. You may have this test every year starting at age 71. Flexible  sigmoidoscopy or colonoscopy. You may have a sigmoidoscopy every 5 years or a colonoscopy every 10 years starting at age 32. Hepatitis C blood test. Hepatitis B blood test. Sexually transmitted disease (STD) testing. Diabetes screening. This is done by checking your blood sugar (glucose) after you have not eaten for a while (fasting). You may have this done every 1-3 years. Bone density scan. This is done to screen for osteoporosis. You may have this done starting at age  74. Mammogram. This may be done every 1-2 years. Talk to your health care provider about how often you should have regular mammograms. Talk with your health care provider about your test results, treatment options, and if necessary, the need for more tests. Vaccines  Your health care provider may recommend certain vaccines, such as: Influenza vaccine. This is recommended every year. Tetanus, diphtheria, and acellular pertussis (Tdap, Td) vaccine. You may need a Td booster every 10 years. Zoster vaccine. You may need this after age 74. Pneumococcal 13-valent conjugate (PCV13) vaccine. One dose is recommended after age 74. Pneumococcal polysaccharide (PPSV23) vaccine. One dose is recommended after age 74. Talk to your health care provider about which screenings and vaccines you need and how often you need them. This information is not intended to replace advice given to you by your health care provider. Make sure you discuss any questions you have with your health care provider. Document Released: 11/17/2015 Document Revised: 07/10/2016 Document Reviewed: 08/22/2015 Elsevier Interactive Patient Education  2017 [employer].  Fall Prevention in the Home Falls can cause injuries. They can happen to people of all ages. There are many things you can do to make your home safe and to help prevent falls. What can I do on the outside of my home? Regularly fix the edges of walkways and driveways and fix any cracks. Remove anything that might make you trip as you walk through a door, such as a raised step or threshold. Trim any bushes or trees on the path to your home. Use bright outdoor lighting. Clear any walking paths of anything that might make someone trip, such as rocks or tools. Regularly check to see if handrails are loose or broken. Make sure that both sides of any steps have handrails. Any raised decks and porches should have guardrails on the edges. Have any leaves, snow, or ice cleared  regularly. Use sand or salt on walking paths during winter. Clean up any spills in your garage right away. This includes oil or grease spills. What can I do in the bathroom? Use night lights. Install grab bars by the toilet and in the tub and shower. Do not use towel bars as grab bars. Use non-skid mats or decals in the tub or shower. If you need to sit down in the shower, use a plastic, non-slip stool. Keep the floor dry. Clean up any water that spills on the floor as soon as it happens. Remove soap buildup in the tub or shower regularly. Attach bath mats securely with double-sided non-slip rug tape. Do not have throw rugs and other things on the floor that can make you trip. What can I do in the bedroom? Use night lights. Make sure that you have a light by your bed that is easy to reach. Do not use any sheets or blankets that are too big for your bed. They should not hang down onto the floor. Have a firm chair that has side arms. You can use this for support while you get dressed.  Do not have throw rugs and other things on the floor that can make you trip. What can I do in the kitchen? Clean up any spills right away. Avoid walking on wet floors. Keep items that you use a lot in easy-to-reach places. If you need to reach something above you, use a strong step stool that has a grab bar. Keep electrical cords out of the way. Do not use floor polish or wax that makes floors slippery. If you must use wax, use non-skid floor wax. Do not have throw rugs and other things on the floor that can make you trip. What can I do with my stairs? Do not leave any items on the stairs. Make sure that there are handrails on both sides of the stairs and use them. Fix handrails that are broken or loose. Make sure that handrails are as long as the stairways. Check any carpeting to make sure that it is firmly attached to the stairs. Fix any carpet that is loose or worn. Avoid having throw rugs at the top or  bottom of the stairs. If you do have throw rugs, attach them to the floor with carpet tape. Make sure that you have a light switch at the top of the stairs and the bottom of the stairs. If you do not have them, ask someone to add them for you. What else can I do to help prevent falls? Wear shoes that: Do not have high heels. Have rubber bottoms. Are comfortable and fit you well. Are closed at the toe. Do not wear sandals. If you use a stepladder: Make sure that it is fully opened. Do not climb a closed stepladder. Make sure that both sides of the stepladder are locked into place. Ask someone to hold it for you, if possible. Clearly mark and make sure that you can see: Any grab bars or handrails. First and last steps. Where the edge of each step is. Use tools that help you move around (mobility aids) if they are needed. These include: Canes. Walkers. Scooters. Crutches. Turn on the lights when you go into a dark area. Replace any light bulbs as soon as they burn out. Set up your furniture so you have a clear path. Avoid moving your furniture around. If any of your floors are uneven, fix them. If there are any pets around you, be aware of where they are. Review your medicines with your doctor. Some medicines can make you feel dizzy. This can increase your chance of falling. Ask your doctor what other things that you can do to help prevent falls. This information is not intended to replace advice given to you by your health care provider. Make sure you discuss any questions you have with your health care provider. Document Released: 08/17/2009 Document Revised: 03/28/2016 Document Reviewed: 11/25/2014 Elsevier Interactive Patient Education  2017 [employer].

## 2023-02-10 NOTE — Addendum Note (Signed)
Addended by: Mort Sawyers on: 02/10/2023 07:28 AM   Modules accepted: Orders

## 2023-02-10 NOTE — Progress Notes (Signed)
noted 

## 2023-02-10 NOTE — Progress Notes (Signed)
I connected with  Abigail [name] on 02/10/23 by a audio enabled telemedicine application and verified that I am speaking with the correct person using two identifiers.  Patient Location: Home  Provider Location: Office/Clinic  I discussed the limitations of evaluation and management by telemedicine. The patient expressed understanding and agreed to proceed.  Subjective:   Abigail [name] is a 72 y.o. female who presents for Medicare Annual (Subsequent) preventive examination.  Review of Systems      Cardiac Risk Factors include: advanced age (>60men, >47 women);diabetes mellitus;hypertension;sedentary lifestyle     Objective:    Today's Vitals   02/10/23 0803  Weight: 180 lb (81.6 kg)  Height: 5\' 2"  (1.575 m)   Body mass index is 32.92 kg/m.     02/10/2023    8:15 AM 02/05/2023    1:00 PM 01/08/2023    7:56 AM 02/05/2022    8:25 AM 02/06/2017   12:52 PM 06/15/2015    9:25 AM 06/12/2015    2:34 PM  Advanced Directives  Does Patient Have a Medical Advance Directive? Yes Yes No No No  No  Type of Estate agent of [place];Living will Living will       Does patient want to make changes to medical advance directive?  No - Patient declined       Copy of Healthcare Power of Attorney in Chart? No - copy requested     No - copy requested No - copy requested  Would patient like information on creating a medical advance directive?   No - Patient declined;Yes (Inpatient - patient defers creating a medical advance directive at this time - Information given) No - Patient declined  Yes - Transport planner given;No - patient declined information     Current Medications (verified) Outpatient Encounter Medications as of 02/10/2023  Medication Sig   albuterol (VENTOLIN HFA) 108 (90 Base) MCG/ACT inhaler Inhale 2 puffs into the lungs every 4 (four) hours as needed for wheezing or shortness of breath.   aspirin 81 MG chewable tablet Chew 1 tablet (81 mg total) by mouth daily.    Azelastine HCl 137 MCG/SPRAY SOLN Place 1 spray into both nostrils daily as needed (allergies/sinus issues.).   Capsaicin 0.025 % LOTN Apply 1 Application topically 4 (four) times daily as needed. Apply to the affected area 4 times daily as needed for itching.   clobetasol (TEMOVATE) 0.05 % external solution Apply 1 application topically 2 (two) times daily. (Patient taking differently: Apply 1 application  topically 2 (two) times daily as needed (psoriasis).)   COSENTYX SENSOREADY, 300 MG, 150 MG/ML SOAJ Inject 300 mg into the skin every 30 (thirty) days.   diclofenac (FLECTOR) 1.3 % PTCH Place 1 patch onto the skin 2 (two) times daily. (Patient taking differently: Place 1 patch onto the skin at bedtime as needed (neuropathy pain.).)   DULoxetine (CYMBALTA) 30 MG capsule TAKE 1 CAPSULE(30 MG) BY MOUTH DAILY (Patient taking differently: Take 30 mg by mouth at bedtime.)   esomeprazole (NEXIUM) 20 MG capsule Take 20 mg by mouth in the morning and at bedtime.   Evolocumab (REPATHA SURECLICK) 140 MG/ML SOAJ Inject 140 mg [place] every two weeks   furosemide (LASIX) 20 MG tablet Take 1 tablet (20 mg total) by mouth daily as needed.   gabapentin (NEURONTIN) 600 MG tablet TAKE 2 TABLETS BY MOUTH IN THE MORNING AND AT NIGHT   glucose blood (FREESTYLE LITE) test strip Use as instructed to monitor FSBS 1x daily. Dx:  E11.9   glucose monitoring kit (FREESTYLE) monitoring kit 1 each by Does not apply route as needed for other. Dispense one glucometer of choice, testing strips/lancets for once per day glucose checks.  QS for 1 month, 11 refills.   Lancets (FREESTYLE) lancets Use as instructed   lisinopril (ZESTRIL) 10 MG tablet Take 10 mg by mouth every evening.   metoprolol succinate (TOPROL-XL) 50 MG 24 hr tablet Take 1 tablet (50 mg total) by mouth daily. Take with or immediately following a meal.   mometasone (NASONEX) 50 MCG/ACT nasal spray Place 2 sprays into the nose daily as needed (allergies).   Semaglutide, 2  MG/DOSE, 8 MG/3ML SOPN Inject 2 mg as directed once a week.   No facility-administered encounter medications on file as of 02/10/2023.    Allergies (verified) Statins and Cinnamon   History: Past Medical History:  Diagnosis Date   Allergy    Anxiety    Arthritis    hands, knees   Cancer    Cataract    surgery to remove -bilateral   COPD (chronic obstructive pulmonary disease)    Depression    Diabetes mellitus    a. A1c 6.6 in 05/2013 indicating new dx.   Fatty liver    Female stress incontinence    GERD (gastroesophageal reflux disease)    Heart murmur    no problems   Hemorrhoids    History of kidney stones    surgery to remove   Hyperlipidemia    Hyperplastic colon polyp 06/07/2008   Hypertension    Hypertriglyceridemia    IBS (irritable bowel syndrome)    Lung nodule    a. 13mm left lung nodule by CT 05/2013.   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 Excell Seltzer and Dr. Leafy Ro   SCCA (squamous cell carcinoma) of skin 01/16/2022   Left [place] Cheek (in situ) (tx p bx)   Skin cancer    Squamous cell carcinoma of skin 01/16/2022   Right Breast (in situ) (curet and 5FU)   Past Surgical History:  Procedure Laterality Date   CATARACT EXTRACTION W/PHACO Left 10/06/2014   Procedure: CATARACT EXTRACTION PHACO AND INTRAOCULAR LENS PLACEMENT LEFT EYE;  Surgeon: Gemma Payor, MD;  Location: [facility];  Service: Ophthalmology;  Laterality: Left;  CDE 7.35   CATARACT EXTRACTION W/PHACO Right 11/14/2014   Procedure: CATARACT EXTRACTION PHACO AND INTRAOCULAR LENS PLACEMENT RIGHT EYE;  Surgeon: Gemma Payor, MD;  Location: [facility];  Service: Ophthalmology;  Laterality: Right;  CDE:6.39   COLONOSCOPY  06/2008   hx polyps   CYSTOSCOPY W/ RETROGRADES Right 01/21/2017   Procedure: CYSTOSCOPY WITH RETROGRADE PYELOGRAM;  Surgeon: Vanna Scotland, MD;  Location: [facility];   Service: Urology;  Laterality: Right;   CYSTOSCOPY/URETEROSCOPY/HOLMIUM LASER/STENT PLACEMENT Left 01/21/2017   Procedure: CYSTOSCOPY/URETEROSCOPY/HOLMIUM LASER/STENT PLACEMENT;  Surgeon: Vanna Scotland, MD;  Location: [facility];  Service: Urology;  Laterality: Left;   DORSAL COMPARTMENT RELEASE Left 06/15/2015   Procedure: LEFT WRIST DEQUERVAINS;  Surgeon: Loreta Ave, MD;  Location:  SURGERY CENTER;  Service: Orthopedics;  Laterality: Left;   HERNIA REPAIR     INTRAOPERATIVE TRANSTHORACIC ECHOCARDIOGRAM N/A 02/05/2023   Procedure: INTRAOPERATIVE TRANSTHORACIC ECHOCARDIOGRAM;  Surgeon: Tonny Bollman, MD;  Location: [facility] INVASIVE CV LAB;  Service: Open Heart Surgery;  Laterality: N/A;   MOUTH SURGERY     tooth ext  NASAL SINUS SURGERY     RIGHT/LEFT HEART CATH AND CORONARY ANGIOGRAPHY N/A 01/08/2023   Procedure: RIGHT/LEFT HEART CATH AND CORONARY ANGIOGRAPHY;  Surgeon: Tonny Bollman, MD;  Location: [facility] INVASIVE CV LAB;  Service: Cardiovascular;  Laterality: N/A;   SHOULDER SURGERY     right   TONSILLECTOMY     TRANSCATHETER AORTIC VALVE REPLACEMENT, TRANSFEMORAL Right 02/05/2023   Procedure: Transcatheter Aortic Valve Replacement, Transfemoral;  Surgeon: Tonny Bollman, MD;  Location: [facility] INVASIVE CV LAB;  Service: Open Heart Surgery;  Laterality: Right;   UPPER GI ENDOSCOPY     normal per patient   VENTRAL HERNIA REPAIR     Family History  Problem Relation Age of Onset   Breast cancer Mother    Colon polyps Maternal Aunt    Heart disease Maternal Grandfather    Kidney cancer Neg Hx    Bladder Cancer Neg Hx    Colon cancer Neg Hx    Stomach cancer Neg Hx    Rectal cancer Neg Hx    Pancreatic cancer Neg Hx    Esophageal cancer Neg Hx    Social History   Socioeconomic History   Marital status: Married    Spouse name: Lyda Jester   Number of children: 2   Years of education: trade school   Highest education level: Not on file  Occupational History   Occupation: retired   Tobacco Use   Smoking status: Never   Smokeless tobacco: Never  Vaping Use   Vaping Use: Never used  Substance and Sexual Activity   Alcohol use: No   Drug use: No   Sexual activity: Yes    Birth control/protection: Post-menopausal  Other Topics Concern   Not on file  Social History Narrative   01/24/22   From: the area   Living: with Lyda Jester, husband (1968)   Work: retired - school bus Printmaker      Family: 2 children - Letta Median and Hilda Lias - 4 grandchildren      Enjoys: farm life, play with grandchildren, travel      Exercise: stairs at home 20 times a day, walking the property   Diet: not following diabetic diet but trying to do better      Safety   Seat belts: Yes    Guns: Yes  and secure   Safe in relationships: Yes       Social Determinants of Health   Financial Resource Strain: Low Risk  (02/10/2023)   Overall Financial Resource Strain (CARDIA)    Difficulty of Paying Living Expenses: Not hard at all  Food Insecurity: No Food Insecurity (02/10/2023)   Hunger Vital Sign    Worried About Running Out of Food in the Last Year: Never true    Ran Out of Food in the Last Year: Never true  Transportation Needs: No Transportation Needs (02/10/2023)   PRAPARE - Administrator, Civil Service (Medical): No    Lack of Transportation (Non-Medical): No  Physical Activity: Inactive (02/10/2023)   Exercise Vital Sign    Days of Exercise per Week: 0 days    Minutes of Exercise per Session: 0 min  Stress: No Stress Concern Present (02/10/2023)   [employer] of Occupational Health - Occupational Stress Questionnaire    Feeling of Stress : Not at all  Social Connections: Moderately Integrated (02/10/2023)   Social Connection and Isolation Panel [NHANES]    Frequency of Communication with Friends and Family: More than three times a week    Frequency  of Social Gatherings with Friends and Family: More than three times a week    Attends Religious Services: More than 4 times  per year    Active Member of [employer] or Organizations: No    Attends Engineer, structural: Never    Marital Status: Married    Tobacco Counseling Counseling given: Not Answered   Clinical Intake:  Pre-visit preparation completed: Yes  Pain : No/denies pain     Nutritional Risks: None Diabetes: Yes CBG done?: No Did pt. bring in CBG monitor from home?: No  How often do you need to have someone help you when you read instructions, pamphlets, or other written materials from your doctor or pharmacy?: 1 - Never  Diabetic?Nutrition Risk Assessment:  Has the patient had any N/V/D within the last 2 months?  No  Does the patient have any non-healing wounds?  No  Has the patient had any unintentional weight loss or weight gain?  No   Diabetes:  Is the patient diabetic?  Yes  If diabetic, was a CBG obtained today?  No  Did the patient bring in their glucometer from home?  No  How often do you monitor your CBG's? 3 times a week.   Financial Strains and Diabetes Management:  Are you having any financial strains with the device, your supplies or your medication? No .  Does the patient want to be seen by Chronic Care Management for management of their diabetes?  No  Would the patient like to be referred to a Nutritionist or for Diabetic Management?  No   Diabetic Exams:  Diabetic Eye Exam: Completed 07/18/23 [facility] Diabetic Foot Exam: Completed 05/01/22 podiatrist    Interpreter Needed?: No  Information entered by :: C.Chaquana Nichols LPN   Activities of Daily Living    02/10/2023    8:15 AM 02/05/2023    6:55 AM  In your present state of health, do you have any difficulty performing the following activities:  Hearing? 0   Vision? 0   Difficulty concentrating or making decisions? 0   Walking or climbing stairs? 0   Dressing or bathing? 0   Doing errands, shopping? 0 0  Preparing Food and eating ? N   Using the Toilet? N   In the past six months, have you accidently  leaked urine? Y   Comment Occasionally if waits to long   Do you have problems with loss of bowel control? N   Managing your Medications? N   Managing your Finances? N   Housekeeping or managing your Housekeeping? N     Patient Care Team: Mort Sawyers, FNP as PCP - General (Family Medicine) Mallipeddi, Orion Modest, MD as PCP - Cardiology (Cardiology) Glyn Ade, PA-C as Physician Assistant (Dermatology) Kathyrn Sheriff, [facility] as Pharmacist (Pharmacist) Elinor Parkinson, DPM as Consulting Physician (Podiatry) Napoleon Form, MD as Consulting Physician (Gastroenterology) Ewing Schlein, MD as Referring Physician (Pain Medicine)  Indicate any recent Medical Services you may have received from other than [facility] providers in the past year (date may be approximate).     Assessment:   This is a routine wellness examination for [facility].  Hearing/Vision screen Hearing Screening - Comments:: No aids Vision Screening - Comments:: Readers- [facility]  Dietary issues and exercise activities discussed: Current Exercise Habits: The patient does not participate in regular exercise at present, Exercise limited by: None identified   Goals Addressed             This Visit's Progress  Patient Stated       Drink more water and eat healthier.       Depression Screen    02/10/2023    8:14 AM 01/14/2023    9:00 AM 02/05/2022    8:23 AM 01/24/2022   11:27 AM 12/01/2020   10:45 AM 09/23/2018    9:03 AM 05/22/2018    8:54 AM  PHQ 2/9 Scores  PHQ - 2 Score 0 0 0 0 0 0 0    Fall Risk    02/10/2023    8:15 AM 01/14/2023    8:59 AM 02/05/2022    8:17 AM 01/24/2022   10:21 AM 12/01/2020   10:44 AM  Fall Risk   Falls in the past year? 0 0 0 0 1  Comment     Fell last week in her basement, tripped over something. Denies hitting her head.  Number falls in past yr: 0 0 0 0 0  Injury with Fall? 0 0 0  0  Risk for fall due to : No Fall Risks  Orthopedic patient;Other (Comment)    Risk for fall  due to: Comment   neuropathy    Follow up Falls prevention discussed;Falls evaluation completed Falls evaluation completed;Education provided;Falls prevention discussed Falls prevention discussed  Falls evaluation completed    FALL RISK PREVENTION PERTAINING TO THE HOME:  Any stairs in or around the home? Yes  If so, are there any without handrails? No  Home free of loose throw rugs in walkways, pet beds, electrical cords, etc? Yes  Adequate lighting in your home to reduce risk of falls? Yes   ASSISTIVE DEVICES UTILIZED TO PREVENT FALLS:  Life alert? No  Use of a cane, walker or w/c? No  Grab bars in the bathroom? Yes  Shower chair or bench in shower? Yes  Elevated toilet seat or a handicapped toilet? Yes    Cognitive Function:        02/10/2023    8:16 AM 02/05/2022    8:27 AM  6CIT Screen  What Year? 0 points 0 points  What month? 0 points 0 points  What time? 0 points 0 points  Count back from 20 0 points 0 points  Months in reverse 0 points 0 points  Repeat phrase 4 points 2 points  Total Score 4 points 2 points    Immunizations Immunization History  Administered Date(s) Administered   Pneumococcal Conjugate-13 04/02/2017   Pneumococcal Polysaccharide-23 01/19/2018    TDAP status: Due, Education has been provided regarding the importance of this vaccine. Advised may receive this vaccine at local pharmacy or Health Dept. Aware to provide a copy of the vaccination record if obtained from local pharmacy or Health Dept. Verbalized acceptance and understanding.  Flu Vaccine status: Declined, Education has been provided regarding the importance of this vaccine but patient still declined. Advised may receive this vaccine at local pharmacy or Health Dept. Aware to provide a copy of the vaccination record if obtained from local pharmacy or Health Dept. Verbalized acceptance and understanding.  Pneumococcal vaccine status: Up to date  Covid-19 vaccine status: Declined,  Education has been provided regarding the importance of this vaccine but patient still declined. Advised may receive this vaccine at local pharmacy or Health Dept.or vaccine clinic. Aware to provide a copy of the vaccination record if obtained from local pharmacy or Health Dept. Verbalized acceptance and understanding.  Qualifies for Shingles Vaccine? Yes   Zostavax completed No   Shingrix Completed?: No.    Education  has been provided regarding the importance of this vaccine. Patient has been advised to call insurance company to determine out of pocket expense if they have not yet received this vaccine. Advised may also receive vaccine at local pharmacy or Health Dept. Verbalized acceptance and understanding.  Screening Tests Health Maintenance  Topic Date Due   DTaP/Tdap/Td (1 - Tdap) Never done   DEXA SCAN  01/23/2023   Zoster Vaccines- Shingrix (1 of 2) 04/16/2023 (Originally 06/11/1970)   MAMMOGRAM  01/14/2024 (Originally 12/18/2022)   FOOT EXAM  05/02/2023   HEMOGLOBIN A1C  05/09/2023   INFLUENZA VACCINE  06/05/2023   COLONOSCOPY (Pts 45-62yrs Insurance coverage will need to be confirmed)  06/06/2023   OPHTHALMOLOGY EXAM  07/18/2023   Diabetic kidney evaluation - Urine ACR  08/03/2023   Diabetic kidney evaluation - eGFR measurement  02/06/2024   Medicare Annual Wellness (AWV)  02/10/2024   Pneumonia Vaccine 45+ Years old  Completed   Hepatitis C Screening  Completed   HPV VACCINES  Aged Out   COVID-19 Vaccine  Discontinued    Health Maintenance  Health Maintenance Due  Topic Date Due   DTaP/Tdap/Td (1 - Tdap) Never done   DEXA SCAN  01/23/2023    Colorectal cancer screening: Type of screening: Colonoscopy. Completed 06/05/18. Repeat every 5 years Pt declined  Mammogram status: Completed 12/18/20. Repeat every year pt declined  Bone Density status: Completed 01/22/18. Results reflect: Bone density results: NORMAL. Repeat every 5 years. Declined  Lung Cancer Screening: (Low  Dose CT Chest recommended if Age 4-80 years, 30 pack-year currently smoking OR have quit w/in 15years.) does not qualify.   Lung Cancer Screening Referral: no  Additional Screening:  Hepatitis C Screening: does qualify; Completed 12/27/15  Vision Screening: Recommended annual ophthalmology exams for early detection of glaucoma and other disorders of the eye. Is the patient up to date with their annual eye exam?  Yes  Who is the provider or what is the name of the office in which the patient attends annual eye exams? 07/17/22 If pt is not established with a provider, would they like to be referred to a provider to establish care? No .   Dental Screening: Recommended annual dental exams for proper oral hygiene  Community Resource Referral / Chronic Care Management: CRR required this visit?  No   CCM required this visit?  No      Plan:     I have personally reviewed and noted the following in the patient's chart:   Medical and social history Use of alcohol, tobacco or illicit drugs  Current medications and supplements including opioid prescriptions. Patient is not currently taking opioid prescriptions. Functional ability and status Nutritional status Physical activity Advanced directives List of other physicians Hospitalizations, surgeries, and ER visits in previous 12 months Vitals Screenings to include cognitive, depression, and falls Referrals and appointments  In addition, I have reviewed and discussed with patient certain preventive protocols, quality metrics, and best practice recommendations. A written personalized care plan for preventive services as well as general preventive health recommendations were provided to patient.     Maryan Puls, LPN   11/09/1094   Nurse Notes: Pt declined mammogram, dexa scan, and colonoscopy at this time.  Vaccinations: declines all Influenza vaccine: recommend every Fall Pneumococcal vaccine: recommend once per lifetime  Prevnar-20 Tdap vaccine: recommend every 10 years Shingles vaccine: recommend Shingrix which is 2 doses 2-6 months apart and over 90% effective     Covid-19: recommend 2 doses  one month apart with a booster 6 months later

## 2023-02-11 NOTE — Progress Notes (Signed)
Noted pt to f/u with ordering physician

## 2023-02-14 ENCOUNTER — Encounter: Payer: Self-pay | Admitting: Internal Medicine

## 2023-02-14 ENCOUNTER — Ambulatory Visit: Payer: Medicare Other | Attending: Internal Medicine | Admitting: Internal Medicine

## 2023-02-14 VITALS — BP 122/64 | HR 68 | Ht 62.0 in | Wt 182.0 lb

## 2023-02-14 DIAGNOSIS — I10 Essential (primary) hypertension: Secondary | ICD-10-CM | POA: Diagnosis not present

## 2023-02-14 NOTE — Progress Notes (Addendum)
Cardiology Office Note  Date: 02/14/2023   ID: Abigail [name], Abigail [name] 09/27/1951, MRN [id number]  PCP:  Mort Sawyers, FNP  Cardiologist:  Marjo Bicker, MD Electrophysiologist:  None   Reason for Office Visit: Follow-up of post TAVR   History of Present Illness: Abigail [name] is a 72 y.o. female known to have severe aortic valve stenosis s/p Edwards SAPIEN 3 ultra Resilia THV (size 23 mm) on 02/05/2023, mild mitral valve stenosis, LV mid cavity gradient 66 mmHg, HTN, DM 2, COPD is here for follow-up visit.  Patient was referred to cardiology clinic in 12/2022 for evaluation of severe aortic valve stenosis. Echocardiogram on 12/09/2022 showed showed LVEF 65 to 70%, severe asymmetric basal septal hypertrophy measuring 1.9 cm, mild SAM of the anterior MV leaflet, dynamic subvalvular gradient, peak gradient at least 25 mmHg, G1 DD, normal RV systolic function, severely dilated LA, mild mitral valve stenosis, severe stenosis of aortic valve with mean gradient 51 mmHg, peak gradient 88.6 mmHg, valve area 0.73 cm (aortic valve area by VTI was 1.04 cm and mean gradient 24 mmHg on 04/2021 study). She has symptoms of DOE for quite a while for which she was scheduled for LHC and RHC and referred to structural cardiology.  LHC showed mild nonobstructive CAD, 40% mid LAD stenosis and LHC/RHC confirmed findings of severe aortic valve stenosis. She subsequently underwent TAVR procedure on 02/05/2023 with no complications. Echocardiogram postprocedure showed normal functioning of the aortic valve prosthesis but there was evidence of mid cavitary gradient (known), peak gradient 62 mmHg which increased to 66 mmHg with Valsalva. Her metoprolol dose was increased from 25 mg to 50 mg once daily.  Patient presents today for follow-up visit. Denies any DOE or angina. She wants to resume her daily activities and household chores.  She has a big backyard and wants to walk at home and not go to cardiac rehab.  Denies any other  symptoms of dizziness, lightheadedness, syncope. Denies smoking cigarettes. No family history of PCI/CABG/CHF/HCM.   Past Medical History:  Diagnosis Date   Allergy    Anxiety    Arthritis    hands, knees   Cancer    Cataract    surgery to remove -bilateral   COPD (chronic obstructive pulmonary disease)    Depression    Diabetes mellitus    a. A1c 6.6 in 05/2013 indicating new dx.   Fatty liver    Female stress incontinence    GERD (gastroesophageal reflux disease)    Heart murmur    no problems   Hemorrhoids    History of kidney stones    surgery to remove   Hyperlipidemia    Hyperplastic colon polyp 06/07/2008   Hypertension    Hypertriglyceridemia    IBS (irritable bowel syndrome)    Lung nodule    a. 4mm left lung nodule by CT 05/2013.   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 Excell Seltzer and Dr. Leafy Ro   SCCA (squamous cell carcinoma) of skin 01/16/2022   Left Malar Cheek (in situ) (tx p bx)   Skin cancer    Squamous cell carcinoma of skin 01/16/2022   Right Breast (in situ) (curet and 5FU)    Past Surgical History:  Procedure Laterality Date   CATARACT EXTRACTION W/PHACO Left 10/06/2014   Procedure: CATARACT EXTRACTION PHACO AND INTRAOCULAR LENS PLACEMENT LEFT  EYE;  Surgeon: Gemma Payor, MD;  Location: [facility];  Service: Ophthalmology;  Laterality: Left;  CDE 7.35   CATARACT EXTRACTION W/PHACO Right 11/14/2014   Procedure: CATARACT EXTRACTION PHACO AND INTRAOCULAR LENS PLACEMENT RIGHT EYE;  Surgeon: Gemma Payor, MD;  Location: [facility];  Service: Ophthalmology;  Laterality: Right;  CDE:6.39   COLONOSCOPY  06/2008   hx polyps   CYSTOSCOPY W/ RETROGRADES Right 01/21/2017   Procedure: CYSTOSCOPY WITH RETROGRADE PYELOGRAM;  Surgeon: Vanna Scotland, MD;  Location: [facility];  Service: Urology;  Laterality: Right;   CYSTOSCOPY/URETEROSCOPY/HOLMIUM  LASER/STENT PLACEMENT Left 01/21/2017   Procedure: CYSTOSCOPY/URETEROSCOPY/HOLMIUM LASER/STENT PLACEMENT;  Surgeon: Vanna Scotland, MD;  Location: [facility];  Service: Urology;  Laterality: Left;   DORSAL COMPARTMENT RELEASE Left 06/15/2015   Procedure: LEFT WRIST DEQUERVAINS;  Surgeon: Loreta Ave, MD;  Location: [facility];  Service: Orthopedics;  Laterality: Left;   HERNIA REPAIR     INTRAOPERATIVE TRANSTHORACIC ECHOCARDIOGRAM N/A 02/05/2023   Procedure: INTRAOPERATIVE TRANSTHORACIC ECHOCARDIOGRAM;  Surgeon: Tonny Bollman, MD;  Location: [facility] INVASIVE CV LAB;  Service: Open Heart Surgery;  Laterality: N/A;   MOUTH SURGERY     tooth ext   NASAL SINUS SURGERY     RIGHT/LEFT HEART CATH AND CORONARY ANGIOGRAPHY N/A 01/08/2023   Procedure: RIGHT/LEFT HEART CATH AND CORONARY ANGIOGRAPHY;  Surgeon: Tonny Bollman, MD;  Location: [facility] INVASIVE CV LAB;  Service: Cardiovascular;  Laterality: N/A;   SHOULDER SURGERY     right   TONSILLECTOMY     TRANSCATHETER AORTIC VALVE REPLACEMENT, TRANSFEMORAL Right 02/05/2023   Procedure: Transcatheter Aortic Valve Replacement, Transfemoral;  Surgeon: Tonny Bollman, MD;  Location: [facility] INVASIVE CV LAB;  Service: Open Heart Surgery;  Laterality: Right;   UPPER GI ENDOSCOPY     normal per patient   VENTRAL HERNIA REPAIR      Current Outpatient Medications  Medication Sig Dispense Refill   albuterol (VENTOLIN HFA) 108 (90 Base) MCG/ACT inhaler Inhale 2 puffs into the lungs every 4 (four) hours as needed for wheezing or shortness of breath. 1 each 1   aspirin 81 MG chewable tablet Chew 1 tablet (81 mg total) by mouth daily.     Azelastine HCl 137 MCG/SPRAY SOLN Place 1 spray into both nostrils daily as needed (allergies/sinus issues.).     Capsaicin 0.025 % LOTN Apply 1 Application topically 4 (four) times daily as needed. Apply to the affected area 4 times daily as needed for itching. 113.2 mL 0   clobetasol (TEMOVATE) 0.05 % external solution Apply 1  application topically 2 (two) times daily. (Patient taking differently: Apply 1 application  topically 2 (two) times daily as needed (psoriasis).) 50 mL 2   COSENTYX SENSOREADY, 300 MG, 150 MG/ML SOAJ Inject 300 mg into the skin every 30 (thirty) days.     diclofenac (FLECTOR) 1.3 % PTCH Place 1 patch onto the skin 2 (two) times daily. (Patient taking differently: Place 1 patch onto the skin at bedtime as needed (neuropathy pain.).) 60 patch 6   DULoxetine (CYMBALTA) 30 MG capsule TAKE 1 CAPSULE(30 MG) BY MOUTH DAILY (Patient taking differently: Take 30 mg by mouth at bedtime.) 90 capsule 2   esomeprazole (NEXIUM) 20 MG capsule Take 20 mg by mouth in the morning and at bedtime.     Evolocumab (REPATHA SURECLICK) 140 MG/ML SOAJ Inject 140 mg [place] every two weeks 2 mL 5   furosemide (LASIX) 20 MG tablet Take 1 tablet (20 mg total) by mouth daily as needed. 30 tablet  3   gabapentin (NEURONTIN) 600 MG tablet TAKE 2 TABLETS BY MOUTH IN THE MORNING AND AT NIGHT 360 tablet 2   glucose blood (FREESTYLE LITE) test strip Use as instructed to monitor FSBS 1x daily. Dx: E11.9 100 each 1   glucose monitoring kit (FREESTYLE) monitoring kit 1 each by Does not apply route as needed for other. Dispense one glucometer of choice, testing strips/lancets for once per day glucose checks.  QS for 1 month, 11 refills. 1 each 0   Lancets (FREESTYLE) lancets Use as instructed 100 each 12   lisinopril (ZESTRIL) 10 MG tablet Take 10 mg by mouth every evening.     metoprolol succinate (TOPROL-XL) 50 MG 24 hr tablet Take 1 tablet (50 mg total) by mouth daily. Take with or immediately following a meal. 120 tablet 3   mometasone (NASONEX) 50 MCG/ACT nasal spray Place 2 sprays into the nose daily as needed (allergies).     Semaglutide, 2 MG/DOSE, 8 MG/3ML SOPN Inject 2 mg as directed once a week. 3 mL 1   No current facility-administered medications for this visit.   Allergies:  Statins and Cinnamon   Social History: The patient   reports that she has never smoked. She has never used smokeless tobacco. She reports that she does not drink alcohol and does not use drugs.   Family History: The patient's family history includes Breast cancer in her mother; Colon polyps in her maternal aunt; Heart disease in her maternal grandfather.   ROS:  Please see the history of present illness. Otherwise, complete review of systems is positive for none.  All other systems are reviewed and negative.   Physical Exam: VS:  BP 122/64   Pulse 68   Ht  (1.575 m)   Wt 182 lb (82.6 kg)   SpO2 96%   BMI 33.29 kg/m , BMI Body mass index is 33.29 kg/m.  Wt Readings from Last 3 Encounters:  02/14/23 182 lb (82.6 kg)  02/10/23 180 lb (81.6 kg)  02/06/23 184 lb 4.8 oz (83.6 kg)    General: Patient appears comfortable at rest. HEENT: Conjunctiva and lids normal, oropharynx clear with moist mucosa. Neck: Supple, no elevated JVP or carotid bruits, no thyromegaly. Lungs: Clear to auscultation, nonlabored breathing at rest. Cardiac: Regular rate and rhythm, no S3 or significant systolic murmur, no pericardial rub. Abdomen: Soft, nontender, no hepatomegaly, bowel sounds present, no guarding or rebound. Extremities: No pitting edema, distal pulses 2+. Skin: Warm and dry. Musculoskeletal: No kyphosis. Neuropsychiatric: Alert and oriented x3, affect grossly appropriate.  ECG:  An ECG dated 01/01/2023 was personally reviewed today and demonstrated:  Normal sinus rhythm, Q waves in inferior leads and T wave inversions in the lateral leads (1 and aVL).  Recent Labwork: 02/03/2023: ALT 19; AST 24 02/06/2023: BUN 8; Creatinine, Ser 0.80; Hemoglobin 11.9; Magnesium 1.7; Platelets 141; Potassium 3.5; Sodium 136     Component Value Date/Time   CHOL 282 (H) 11/08/2022 1233   TRIG 224.0 (H) 11/08/2022 1233   HDL 51.00 11/08/2022 1233   CHOLHDL 6 11/08/2022 1233   VLDL 44.8 (H) 11/08/2022 1233   LDLCALC 168 (H) 07/02/2021 1224   LDLDIRECT 204.0  11/08/2022 1233    Other Studies Reviewed Today: Echocardiogram on 12/09/2022 LVEF 65 to 70%, severe asymmetric basal septal hypertrophy measuring 1.9 cm, mild SAM of the anterior MV leaflet, dynamic subvalvular gradient, peak gradient at least 25 mmHg, G1 DD, normal RV systolic function, severely dilated LA, mild mitral valve stenosis,  severe stenosis of aortic valve with mean gradient 51 mmHg, peak gradient 88.6 mmHg, valve area 0.73 cm (aortic valve area by VTI was 1.04 cm and mean gradient 24 mmHg on 04/2021 study).  Assessment and Plan: Patient is a 72 year old F known to have severe aortic valve stenosis s/p Edwards SAPIEN 3 ultra Resilia THV (size 23 mm) on 02/05/2023, mild mitral valve stenosis, LV mid cavity gradient 66 mmHg, HTN, DM 2, COPD is here for follow-up visit.  # Severe aortic valve stenosis s/p TAVR Randa Evens SAPIEN 3 ultra Resilia THV (size 23 mm) on 02/05/2023) -Postprocedure echo showed normal functioning of the aortic valve prosthesis. -Continue aspirin 81 mg once daily -Dental cleanings twice per year -SBE prophylaxis prior to dental procedures  # Hypertrophic cardiomyopathy -Echocardiogram from 12/2022 showed severe asymmetric basal septal hypertrophy measuring 1.9 cm, mild SAM of the anterior MV leaflet and dynamic subvalvular gradient, peak gradient at least 25 mmHg. This gradient increased to 66 mmHg post TAVR.  Cardiac MRI was ordered prior to TAVR and currently scheduled in May 2024. Will reach out to the scheduling coordinator and postpone the cardiac MRI until 3 months after TAVR procedure. -Continue metoprolol succinate 50 mg once daily -Continue Lasix 20 mg as needed for SOB/LE swelling  # Mild mitral valve stenosis -Continue metoprolol succinate 50 mg once daily (started mainly for HCM indication) -Repeat echocardiogram every 3 years (she is scheduled for echocardiogram in 03/2023)  # HTN, controlled -Continue lisinopril 10 mg once daily  # DM2 -Continue  semaglutide/Ozempic per PCP -Previously on Jardiance and was discontinued due to yeast infection  # Severe hypercholesterolemia, LDL more than 190, currently not at goal # Statin intolerance -Patient was intolerant to statins. Started on Repatha by PCP. Continue Repatha Q 2 weeks (started in 12/2022), will need to obtain repeat lipid panel.   I have spent a total of 30 minutes with patient reviewing chart, EKGs, labs and examining patient as well as establishing an assessment and plan that was discussed with the patient.  > 50% of time was spent in direct patient care.       Medication Adjustments/Labs and Tests Ordered: Current medicines are reviewed at length with the patient today.  Concerns regarding medicines are outlined above.   Tests Ordered: Orders Placed This Encounter  Procedures   EKG 12-Lead    Medication Changes: No orders of the defined types were placed in this encounter.   Disposition:  Follow up  6 months  Signed, Tarez Bowns Verne Spurr, MD, 02/14/2023 8:48 AM    [facility] at [facility] [address] [place], [place] [address]

## 2023-02-14 NOTE — Patient Instructions (Signed)
Medication Instructions:  Your physician recommends that you continue on your current medications as directed. Please refer to the Current Medication list given to you today.  *If you need a refill on your cardiac medications before your next appointment, please call your pharmacy*   Lab Work: NONE   If you have labs (blood work) drawn today and your tests are completely normal, you will receive your results only by: MyChart Message (if you have MyChart) OR A paper copy in the mail If you have any lab test that is abnormal or we need to change your treatment, we will call you to review the results.   Testing/Procedures: NONE    Follow-Up: At [facility], you and your health needs are our priority.  As part of our continuing mission to provide you with exceptional heart care, we have created designated Provider Care Teams.  These Care Teams include your primary Cardiologist (physician) and Advanced Practice Providers (APPs -  Physician Assistants and Nurse Practitioners) who all work together to provide you with the care you need, when you need it.  We recommend signing up for the patient portal called "MyChart".  Sign up information is provided on this After Visit Summary.  MyChart is used to connect with patients for Virtual Visits (Telemedicine).  Patients are able to view lab/test results, encounter notes, upcoming appointments, etc.  Non-urgent messages can be sent to your provider as well.   To learn more about what you can do with MyChart, go to [email or web address].    Your next appointment:   6 month(s)  Provider:   You may see Vishnu P Mallipeddi, MD or one of the following Advanced Practice Providers on your designated Care Team:   Randall An, PA-C  Jacolyn Reedy, PA-C     Other Instructions Thank you for choosing [facility]!

## 2023-02-19 ENCOUNTER — Ambulatory Visit: Payer: Self-pay

## 2023-02-19 NOTE — Patient Outreach (Signed)
  Care Coordination   Initial Visit Note   02/19/2023 Name: NETTY SULLIVANT MRN: [id number] DOB: February 02, 1951  Garry Heater Sobel is a 72 y.o. year old female who sees Mort Sawyers, FNP for primary care. I spoke with  [name] by phone today.  What matters to the patients health and wellness today?  Patient states she is doing well status post TAVR.  Patient states states the groin soreness has dissapated. She states she still has some bruising at the groin site. Patient states she is walking 2 times per day and walking up and down 14 steps in her home at least 5 to 6 times per day.  Patient states overall she feels she is doing very well.     Goals Addressed             This Visit's Progress    Continued improvement post TAVR       Interventions Today    Flowsheet Row Most Recent Value  Chronic Disease   Chronic disease during today's visit Other  [status post TAVR]  General Interventions   General Interventions Discussed/Reviewed General Interventions Reviewed  [evaluation of current treatment plan s/p TAVR and patients adherence to plan as established by provider.  Assessed for signs/ symptoms of infection/ pain]  Doctor Visits Discussed/Reviewed Doctor Visits Reviewed  Exercise Interventions   Exercise Discussed/Reviewed Physical Activity  Physical Activity Discussed/Reviewed Physical Activity Reviewed  [Discussed importance of maintaining consistent physical activity]  Education Interventions   Education Provided Provided Education  [Reviewed signs/ symptoms of infection.  Advised to notify provider of symptoms when noted.]  Pharmacy Interventions   Pharmacy Dicussed/Reviewed Pharmacy Topics Reviewed  [medications reviewed and compliance discussed.]              SDOH assessments and interventions completed:  Yes  SDOH Interventions Today    Flowsheet Row Most Recent Value  SDOH Interventions   Food Insecurity Interventions Intervention Not Indicated  Housing  Interventions Intervention Not Indicated  Transportation Interventions Intervention Not Indicated        Care Coordination Interventions:  Yes, provided   Follow up plan: Follow up call scheduled for 03/21/23    Encounter Outcome:  Pt. Visit Completed   [name] RN,BSN,CCM [facility] Care Coordination [phone] direct line

## 2023-02-20 ENCOUNTER — Telehealth: Payer: Self-pay

## 2023-02-20 NOTE — Progress Notes (Signed)
Care Management & Coordination Services Pharmacy Team  Reason for Encounter: Appointment Reminder  Contacted patient to confirm telephone appointment with Al Corpus , PharmD on 02/25/23 at 11:45. Spoke with patient on 02/20/2023   Do you have any problems getting your medications? No   What is your top health concern you would like to discuss at your upcoming visit?   Have you seen any other providers since your last visit with PCP? Yes- cardiology   Hospital visits:  02/05/23 thru 02/06/23 - [facility] - TAVR replacement   Star Rating Drugs:  Medication:  Last Fill: Day Supply Lisinopril   Patient states she is taking husbands supply of lisinopril and she takes it daily   Ozempic   On hold 01/27/23   Care Gaps: Annual wellness visit in last year? Yes  If Diabetic: Last eye exam / retinopathy screening:UTD Last diabetic foot exam:UTD   Al Corpus, PharmD notified  Burt Knack, [facility] Clinical Pharmacy Assistant [phone]

## 2023-02-25 ENCOUNTER — Encounter: Payer: Medicare Other | Admitting: Pharmacist

## 2023-02-25 ENCOUNTER — Telehealth: Payer: Self-pay | Admitting: Pharmacist

## 2023-02-25 ENCOUNTER — Telehealth: Payer: Self-pay | Admitting: Family

## 2023-02-25 NOTE — Progress Notes (Unsigned)
Care Management & Coordination Services Pharmacy Note  02/25/2023 Name:  Abigail [name] MRN:  [id number] DOB:  1951-07-23  Summary: F/U visit -HLD/ASCVD risk: LDL 204 off of statins; she was prescribed Repatha last month, but she picked up the pre-filled syringe and is afraid to inject this herself, she would prefer the auto-injector version -DM: A1c 6.0% (11/2022), she is doing well on Ozempic 2 mg weekly, she reports Fasting BG 100-115, she has not had any weight loss -HTN: BP at goal in recent OV; she affirms compliance with lisinopril which she gets through the [place] with her husband   Recommendations/Changes made from today's visit: -Recommend to switch to Repatha Sureclick auto-injector; advised pt to set up office visit with me for device training  Follow up plan: -Pharmacist follow up office visit for Repatha training TBD -Cardiology appt 01/01/23 (new pt)    Subjective: Abigail [name] is an 72 y.o. year old female who is a primary patient of Mort Sawyers, FNP.  The care coordination team was consulted for assistance with disease management and care coordination needs.    Engaged with patient by telephone for follow up visit.  Recent office visits: 01/14/23 NP Dugal OV: acute sinusitis - rx Augmentin. Repeat lipid panel in 2 months (May).  11/08/22 NP Tabitha Dugal OV: TOC - A1c 6.0%; referred to ENT for recurrent sinusitis. LDL 204, start Repatha.   11/01/22 TE - pt stopped pravastatin d/t SE.  09/12/22 Dr Para March OV: psoriasis, acute sinusitis. Rx Augmentin, fluconazole. Refer to dermatology  Recent consult visits: 02/14/23 Dr Jenene Slicker (Cardiology): s/p TAVR, ECHO w/ normal function. No med changes.   01/30/23 Urgent Care - neuropathy in R foot. Rx Capsaicin cream.  01/22/23 Dr Excell Seltzer (Cardiology): aortic valve stenosis - proceed with TAVR.  01/01/23 Dr Jenene Slicker (Cardiology): aortic valve stenosis, cardiomyopathy. Rx Furosemide 20 mg PRN. Rx metoprolol succinate 25 mg daily.  Refer to structural heart clinic, schedule Cath.  12/09/22 ECHO - abnormal mitral valve w/ calcifications. EF 65-70% with LVH. Referred to cardiology.  Hospital visits: 02/05/23 - 02/06/23 planned admission [facility]): TAVR. Increase metoprolol to 50 mg BID. Continue ASA. Lifelong dental SBE with Amoxicillin.  01/08/23 planned admission [facility]): R/L heart cath  Objective:  Lab Results  Component Value Date   CREATININE 0.80 02/06/2023   BUN 8 02/06/2023   GFR 85.81 11/08/2022   EGFR 93 11/16/2021   GFRNONAA >60 02/06/2023   GFRAA 76 07/09/2019   NA 136 02/06/2023   K 3.5 02/06/2023   CALCIUM 8.3 (L) 02/06/2023   CO2 24 02/06/2023   GLUCOSE 173 (H) 02/06/2023    Lab Results  Component Value Date/Time   HGBA1C 6.0 (A) 11/08/2022 11:49 AM   HGBA1C 6.3 (A) 08/02/2022 09:01 AM   HGBA1C 7.9 (H) 11/16/2021 10:30 AM   HGBA1C 6.0 (H) 07/02/2021 12:24 PM   GFR 85.81 11/08/2022 12:33 PM   GFR 89.02 05/01/2022 12:13 PM   MICROALBUR 1.0 08/02/2022 09:35 AM   MICROALBUR 0.3 11/16/2021 10:30 AM    Last diabetic Eye exam:  Lab Results  Component Value Date/Time   HMDIABEYEEXA No Retinopathy 07/17/2022 12:00 AM    Last diabetic Foot exam: No results found for: "HMDIABFOOTEX"   Lab Results  Component Value Date   CHOL 282 (H) 11/08/2022   HDL 51.00 11/08/2022   LDLCALC 168 (H) 07/02/2021   LDLDIRECT 204.0 11/08/2022   TRIG 224.0 (H) 11/08/2022   CHOLHDL 6 11/08/2022       Latest Ref Rng & Units  02/03/2023    8:15 AM 11/08/2022   12:33 PM 05/01/2022   12:13 PM  Hepatic Function  Total Protein 6.5 - 8.1 g/dL 6.2  7.2  6.2   Albumin 3.5 - 5.0 g/dL 3.6  4.4  4.0   AST 15 - 41 U/L 24  20  17    ALT 0 - 44 U/L 19  19  15    Alk Phosphatase 38 - 126 U/L 73  86  71   Total Bilirubin 0.3 - 1.2 mg/dL 0.7  0.6  0.6     Lab Results  Component Value Date/Time   TSH 0.91 04/02/2017 10:12 AM   TSH 0.702 05/12/2014 09:23 AM       Latest Ref Rng & Units 02/06/2023    1:08 AM 02/05/2023   10:48 AM  02/05/2023   10:03 AM  CBC  WBC 4.0 - 10.5 K/uL 7.4     Hemoglobin 12.0 - 15.0 g/dL 16.1  09.6  04.5   Hematocrit 36.0 - 46.0 % 34.7  34.0  33.0   Platelets 150 - 400 K/uL 141       Lab Results  Component Value Date/Time   VD25OH 25 (L) 06/13/2017 02:40 PM    Clinical ASCVD: No  The 10-year ASCVD risk score (Arnett DK, et al., 2019) is: 29.8%   Values used to calculate the score:     Age: 72 years     Sex: Female     Is Non-Hispanic African American: No     Diabetic: Yes     Tobacco smoker: No     Systolic Blood Pressure: 122 mmHg     Is BP treated: Yes     HDL Cholesterol: 51 mg/dL     Total Cholesterol: 282 mg/dL        4/0/9811    9:14 AM 01/14/2023    9:00 AM 02/05/2022    8:23 AM  Depression screen PHQ 2/9  Decreased Interest 0 0 0  Down, Depressed, Hopeless 0 0 0  PHQ - 2 Score 0 0 0  Difficult doing work/chores Not difficult at all Not difficult at all      Social History   Tobacco Use  Smoking Status Never  Smokeless Tobacco Never   BP Readings from Last 3 Encounters:  02/14/23 122/64  02/06/23 (!) 140/56  02/03/23 123/77   Pulse Readings from Last 3 Encounters:  02/14/23 68  02/06/23 (!) 51  02/03/23 65   Wt Readings from Last 3 Encounters:  02/14/23 182 lb (82.6 kg)  02/10/23 180 lb (81.6 kg)  02/06/23 184 lb 4.8 oz (83.6 kg)   BMI Readings from Last 3 Encounters:  02/14/23 33.29 kg/m  02/10/23 32.92 kg/m  02/06/23 33.71 kg/m    Allergies  Allergen Reactions   Statins Other (See Comments)    Failed simvastatin, pravastatin, and rosuvastatin 5 mg weekly (myalgias)   Cinnamon     Dry throat     Medications Reviewed Today     Reviewed by Otho Ket, RN (Registered Nurse) on 02/19/23 at 1314  Med List Status: <None>   Medication Order Taking? Sig Documenting Provider Last Dose Status Informant  albuterol (VENTOLIN HFA) 108 (90 Base) MCG/ACT inhaler [id number] Yes Inhale 2 puffs into the lungs every 4 (four) hours as needed for  wheezing or shortness of breath. Valentino Nose, NP Taking Active Self  aspirin 81 MG chewable tablet [id number] Yes Chew 1 tablet (81 mg total) by mouth daily. Filbert Schilder,  NP Taking Active   Azelastine HCl 137 MCG/SPRAY SOLN [id number] Yes Place 1 spray into both nostrils daily as needed (allergies/sinus issues.). [provider] Taking Active Self  Capsaicin 0.025 % LOTN [id number] Yes Apply 1 Application topically 4 (four) times daily as needed. Apply to the affected area 4 times daily as needed for itching. Leath-Warren, Sadie Haber, NP Taking Active Self  clobetasol (TEMOVATE) 0.05 % external solution [id number] Yes Apply 1 application topically 2 (two) times daily.  Patient taking differently: Apply 1 application  topically 2 (two) times daily as needed (psoriasis).   , Velna Hatchet, MD Taking Active Self  COSENTYX SENSOREADY, 300 MG, 150 MG/ML Ivory Broad [id number] Yes Inject 300 mg into the skin every 30 (thirty) days. [provider] Taking Active Self  diclofenac (FLECTOR) 1.3 % PTCH [id number] Yes Place 1 patch onto the skin 2 (two) times daily.  Patient taking differently: Place 1 patch onto the skin at bedtime as needed (neuropathy pain.).   [place], Max T, DPM Taking Active Self  DULoxetine (CYMBALTA) 30 MG capsule [id number] Yes TAKE 1 CAPSULE(30 MG) BY MOUTH DAILY  Patient taking differently: Take 30 mg by mouth at bedtime.   Hyatt, Max T, DPM Taking Active Self  esomeprazole (NEXIUM) 20 MG capsule [id number] Yes Take 20 mg by mouth in the morning and at bedtime. [provider] Taking Active Self  Evolocumab (REPATHA SURECLICK) 140 MG/ML Ivory Broad [id number] Yes Inject 140 mg [place] every two weeks Mort Sawyers, FNP Taking Active Self  furosemide (LASIX) 20 MG tablet [id number] Yes Take 1 tablet (20 mg total) by mouth daily as needed. Mallipeddi, Vishnu P, MD Taking Active Self  gabapentin (NEURONTIN) 600 MG tablet [id number] Yes TAKE 2 TABLETS BY MOUTH IN THE MORNING  AND AT [facility], Max T, DPM Taking Active Self  glucose blood (FREESTYLE LITE) test strip [id number]  Use as instructed to monitor FSBS 1x daily. Dx: E11.9 Mort Sawyers, FNP  Active Self  glucose monitoring kit (FREESTYLE) monitoring kit [id number]  1 each by Does not apply route as needed for other. Dispense one glucometer of choice, testing strips/lancets for once per day glucose checks.  QS for 1 month, 11 refills. Shade Flood, MD  Active Self  Lancets (FREESTYLE) lancets [id number]  Use as instructed Salley Scarlet, MD  Active Self  lisinopril (ZESTRIL) 10 MG tablet [id number] Yes Take 10 mg by mouth every evening. [provider] Taking Active Self           Med Note Gunnar Fusi, MELISSA R   Fri Jan 31, 2023 12:30 PM)    metoprolol succinate (TOPROL-XL) 50 MG 24 hr tablet [id number] Yes Take 1 tablet (50 mg total) by mouth daily. Take with or immediately following a meal. Filbert Schilder, NP Taking Active   mometasone (NASONEX) 50 MCG/ACT nasal spray [id number] Yes Place 2 sprays into the nose daily as needed (allergies). [provider] Taking Active Self  Semaglutide, 2 MG/DOSE, 8 MG/3ML SOPN [id number] Yes Inject 2 mg as directed once a week. Mort Sawyers, FNP Taking Active             SDOH:  (Social Determinants of Health) assessments and interventions performed: No SDOH Interventions    Flowsheet Row Care Coordination from 02/19/2023 in [facility] Coordination Clinical Support from 02/10/2023 in [facility] [place] Telephone from 02/07/2023 in [facility] Care Coordination Clinical Support from 02/05/2022 in [facility] [place]  SDOH Interventions      Food Insecurity Interventions Intervention Not Indicated Intervention Not Indicated Intervention Not Indicated Intervention Not Indicated  Housing Interventions Intervention Not Indicated Intervention Not Indicated  Intervention Not Indicated Intervention Not Indicated  Transportation Interventions Intervention Not Indicated Intervention Not Indicated Intervention Not Indicated Intervention Not Indicated  Utilities Interventions -- Intervention Not Indicated -- --  Alcohol Usage Interventions -- Intervention Not Indicated (Score <7) -- --  Financial Strain Interventions -- Intervention Not Indicated -- Intervention Not Indicated  Physical Activity Interventions -- Patient Refused, Other (Comments) -- Intervention Not Indicated, Patient Refused  Stress Interventions -- Intervention Not Indicated -- Intervention Not Indicated  Social Connections Interventions -- Intervention Not Indicated -- Intervention Not Indicated       Medication Assistance: None required.  Patient affirms current coverage meets needs.  Medication Access: Within the past 30 days, how often has patient missed a dose of medication? 0 Is a pillbox or other method used to improve adherence? Yes  Factors that may affect medication adherence? no barriers identified Are meds synced by current pharmacy? No  Are meds delivered by current pharmacy? Yes  Does patient experience delays in picking up medications due to transportation concerns? No   Upstream Services Reviewed: Is patient disadvantaged to use [facility] Pharmacy?: Yes  Current Rx insurance plan: BCBS Name and location of Current pharmacy:  [id number] - [place], [place] - [address] AT [facility] [address] [facility] [address] [place] [address] Phone: [phone] Fax: [phone]  [facility] - [place], [place] - [address] [facility] AT Portal to Registered [facility] Sites [address] [place] [address] Phone: [phone] Fax: [phone]  [facility] Pharmacy services reviewed with patient today?: No  Patient requests to transfer care to [facility]stream Pharmacy?: No  Reason patient declined to change  pharmacies: Disadvantaged due to insurance/mail order  Compliance/Adherence/Medication fill history: Care Gaps: None  Star-Rating Drugs: Lisinopril - PDC 0% - LF 11/16/21 x 90 ds Ozempic - PDC 82%    ASSESSMENT / PLAN   Hypertension / Cardiomyopathy (BP goal <130/80) -Controlled- BP at goal in recent OV -Recent diagnosis of mitral valve/aortic valve stenosis and hypertrophic cardiomyopathy (12/2022); pt underwent TAVR 02/06/23 with improved ECHO findings -Current home readings: "good" -Current treatment: Lisinopril 20 mg - 1/2 tab daily ([place])- Appropriate, Effective, Safe, Accessible Metoprolol succinate 50 mg daily  Furosemide 20 mg PRN -Medications previously tried: n/a  -Educated on BP goals and benefits of medications for prevention of heart attack, stroke and kidney damage; Importance of home blood pressure monitoring; -Counseled to monitor BP at home daily -Recommended to continue current medication  Hyperlipidemia: (LDL goal < 70) -Uncontrolled- LDL 204 (11/2022) very high; she has not tolerated multiple statins; she was prescribed Repatha last month, but she picked up the pre-filled syringe and is afraid to inject this herself, she would prefer the auto-injector version -ASCVD risk 29% is very high; hx statin intolerance -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 -Current treatment: Repatha 140 mg q14 days- not started -Medications previously tried: ezetimibe, fenofibrate, rosuvastatin 5 mg weekly, simvastatin 20 mg, pravastatin -Educated on Cholesterol goals; Benefits of Repatha for LDL lowering and ASCVD risk reduction; -Recommend to switch to [employer] auto-injector; advised pt to set up office visit with me for device training  Diabetes (A1c goal <7%) -Controlled - A1c 6.0% (11/2022) at goal; pt is doing well with Ozempic, though has not had any significant  weight loss -Current home glucose readings: Fasting: 114, 111, 71, 101, 121, 118, 94, 86, 134, 109 -Denies  hypoglycemic/hyperglycemic symptoms -Current medications: Ozempic 2 mg weekly - Appropriate, Effective, Safe, Accessible Testing supplies -Medications previously tried: Trulicity, Jardiance (yeast infxn), glipizide, Victoza, metformin (GI), Ozempic 1 mg (GI) -Current meal patterns: increased fruit and vegetables, water -Current exercise: walking -Educated on A1c and blood sugar goals;Complications of diabetes including kidney damage, retinal damage, and cardiovascular disease; -Assessed pt finances - she is able to pay for Ozempic currently ($47/month); discussed PAP is available if needed -Recommended to continue current medication  Neuropathy (Goal: manage symptoms) -Controlled - per pt report -Follows with DPM Hyatt -Current treatment  Duloxetine 30 mg daily - Appropriate, Effective, Safe, Accessible Gabapentin 600 mg - 2 cap BID - Appropriate, Effective, Safe, Accessible -Medications previously tried: n/a  -Recommended to continue current medication  GERD (Goal: manage symptoms) -Controlled - per pt report -Hx esophageal stretching -Current treatment  Esomeprazole 40 mg BID - Appropriate, Effective, Safe, Accessible -Medications previously tried: n/a  -Recommended to continue current medication  Health Maintenance -Vaccine gaps: Shingrix, TDAP    Al Corpus, PharmD, BCACP Clinical Pharmacist [facility] [phone]

## 2023-02-25 NOTE — Telephone Encounter (Signed)
Patient returned a call to the office from [place], would like a call back whenever possible. Please advise [phone], thank you.

## 2023-02-25 NOTE — Telephone Encounter (Signed)
Care Management & Coordination Services Outreach Note  02/25/2023 Name: Abigail [name] MRN: [id number] DOB: 1951-04-26  Referred by: Mort Sawyers, FNP  Patient had a phone appointment scheduled with clinical pharmacist today.  An unsuccessful telephone outreach was attempted today. The patient was referred to the pharmacist for assistance with medications, care management and care coordination.   Patient will NOT be penalized in any way for missing a Care Management & Coordination Services appointment. The no-show fee does not apply.  If possible, a message was left to return call to: [phone] or to [facility].  Al Corpus, PharmD, BCACP Clinical Pharmacist [facility] [phone]

## 2023-02-25 NOTE — Telephone Encounter (Signed)
Rescheduled for 4-29

## 2023-03-03 ENCOUNTER — Encounter: Payer: Medicare Other | Admitting: Pharmacist

## 2023-03-03 ENCOUNTER — Telehealth: Payer: Self-pay | Admitting: Internal Medicine

## 2023-03-03 ENCOUNTER — Telehealth: Payer: Self-pay | Admitting: Pharmacist

## 2023-03-03 NOTE — Progress Notes (Unsigned)
Care Management & Coordination Services Pharmacy Note  03/03/2023 Name:  Abigail [name] MRN:  [id number] DOB:  Sep 24, 1951  Summary: F/U visit -HLD/ASCVD risk: LDL 204 off of statins; she was prescribed Repatha last month, but she picked up the pre-filled syringe and is afraid to inject this herself, she would prefer the auto-injector version -DM: A1c 6.0% (11/2022), she is doing well on Ozempic 2 mg weekly, she reports Fasting BG 100-115, she has not had any weight loss -HTN: BP at goal in recent OV; she affirms compliance with lisinopril which she gets through the [place] with her husband   Recommendations/Changes made from today's visit: -Recommend to switch to Repatha Sureclick auto-injector; advised pt to set up office visit with me for device training  Follow up plan: -Pharmacist follow up office visit for Repatha training TBD -Cardiology appt 01/01/23 (new pt)    Subjective: Abigail [name] is an 72 y.o. year old female who is a primary patient of Mort Sawyers, FNP.  The care coordination team was consulted for assistance with disease management and care coordination needs.    Engaged with patient by telephone for follow up visit.  Recent office visits: 01/14/23 NP Dugal OV: acute sinusitis - rx Augmentin. Repeat lipid panel in 2 months (May).  11/08/22 NP Tabitha Dugal OV: TOC - A1c 6.0%; referred to ENT for recurrent sinusitis. LDL 204, start Repatha.   11/01/22 TE - pt stopped pravastatin d/t SE.  09/12/22 Dr Para March OV: psoriasis, acute sinusitis. Rx Augmentin, fluconazole. Refer to dermatology  Recent consult visits: 02/14/23 Dr Jenene Slicker (Cardiology): s/p TAVR, ECHO w/ normal function. No med changes.   01/30/23 Urgent Care - neuropathy in R foot. Rx Capsaicin cream.  01/22/23 Dr Excell Seltzer (Cardiology): aortic valve stenosis - proceed with TAVR.  01/01/23 Dr Jenene Slicker (Cardiology): aortic valve stenosis, cardiomyopathy. Rx Furosemide 20 mg PRN. Rx metoprolol succinate 25 mg daily.  Refer to structural heart clinic, schedule Cath.  12/09/22 ECHO - abnormal mitral valve w/ calcifications. EF 65-70% with LVH. Referred to cardiology.  Hospital visits: 02/05/23 - 02/06/23 planned admission [facility]): TAVR. Increase metoprolol to 50 mg BID. Continue ASA. Lifelong dental SBE with Amoxicillin.  01/08/23 planned admission [facility]): R/L heart cath  Objective:  Lab Results  Component Value Date   CREATININE 0.80 02/06/2023   BUN 8 02/06/2023   GFR 85.81 11/08/2022   EGFR 93 11/16/2021   GFRNONAA >60 02/06/2023   GFRAA 76 07/09/2019   NA 136 02/06/2023   K 3.5 02/06/2023   CALCIUM 8.3 (L) 02/06/2023   CO2 24 02/06/2023   GLUCOSE 173 (H) 02/06/2023    Lab Results  Component Value Date/Time   HGBA1C 6.0 (A) 11/08/2022 11:49 AM   HGBA1C 6.3 (A) 08/02/2022 09:01 AM   HGBA1C 7.9 (H) 11/16/2021 10:30 AM   HGBA1C 6.0 (H) 07/02/2021 12:24 PM   GFR 85.81 11/08/2022 12:33 PM   GFR 89.02 05/01/2022 12:13 PM   MICROALBUR 1.0 08/02/2022 09:35 AM   MICROALBUR 0.3 11/16/2021 10:30 AM    Last diabetic Eye exam:  Lab Results  Component Value Date/Time   HMDIABEYEEXA No Retinopathy 07/17/2022 12:00 AM    Last diabetic Foot exam: No results found for: "HMDIABFOOTEX"   Lab Results  Component Value Date   CHOL 282 (H) 11/08/2022   HDL 51.00 11/08/2022   LDLCALC 168 (H) 07/02/2021   LDLDIRECT 204.0 11/08/2022   TRIG 224.0 (H) 11/08/2022   CHOLHDL 6 11/08/2022       Latest Ref Rng & Units  02/03/2023    8:15 AM 11/08/2022   12:33 PM 05/01/2022   12:13 PM  Hepatic Function  Total Protein 6.5 - 8.1 g/dL 6.2  7.2  6.2   Albumin 3.5 - 5.0 g/dL 3.6  4.4  4.0   AST 15 - 41 U/L 24  20  17    ALT 0 - 44 U/L 19  19  15    Alk Phosphatase 38 - 126 U/L 73  86  71   Total Bilirubin 0.3 - 1.2 mg/dL 0.7  0.6  0.6     Lab Results  Component Value Date/Time   TSH 0.91 04/02/2017 10:12 AM   TSH 0.702 05/12/2014 09:23 AM       Latest Ref Rng & Units 02/06/2023    1:08 AM 02/05/2023   10:48 AM  02/05/2023   10:03 AM  CBC  WBC 4.0 - 10.5 K/uL 7.4     Hemoglobin 12.0 - 15.0 g/dL 98.1  19.1  47.8   Hematocrit 36.0 - 46.0 % 34.7  34.0  33.0   Platelets 150 - 400 K/uL 141       Lab Results  Component Value Date/Time   VD25OH 25 (L) 06/13/2017 02:40 PM    Clinical ASCVD: No  The 10-year ASCVD risk score (Arnett DK, et al., 2019) is: 29.8%   Values used to calculate the score:     Age: 66 years     Sex: Female     Is Non-Hispanic African American: No     Diabetic: Yes     Tobacco smoker: No     Systolic Blood Pressure: 122 mmHg     Is BP treated: Yes     HDL Cholesterol: 51 mg/dL     Total Cholesterol: 282 mg/dL        12/14/5619    3:08 AM 01/14/2023    9:00 AM 02/05/2022    8:23 AM  Depression screen PHQ 2/9  Decreased Interest 0 0 0  Down, Depressed, Hopeless 0 0 0  PHQ - 2 Score 0 0 0  Difficult doing work/chores Not difficult at all Not difficult at all      Social History   Tobacco Use  Smoking Status Never  Smokeless Tobacco Never   BP Readings from Last 3 Encounters:  02/14/23 122/64  02/06/23 (!) 140/56  02/03/23 123/77   Pulse Readings from Last 3 Encounters:  02/14/23 68  02/06/23 (!) 51  02/03/23 65   Wt Readings from Last 3 Encounters:  02/14/23 182 lb (82.6 kg)  02/10/23 180 lb (81.6 kg)  02/06/23 184 lb 4.8 oz (83.6 kg)   BMI Readings from Last 3 Encounters:  02/14/23 33.29 kg/m  02/10/23 32.92 kg/m  02/06/23 33.71 kg/m    Allergies  Allergen Reactions   Statins Other (See Comments)    Failed simvastatin, pravastatin, and rosuvastatin 5 mg weekly (myalgias)   Cinnamon     Dry throat     Medications Reviewed Today     Reviewed by Otho Ket, RN (Registered Nurse) on 02/19/23 at 1314  Med List Status: <None>   Medication Order Taking? Sig Documenting Provider Last Dose Status Informant  albuterol (VENTOLIN HFA) 108 (90 Base) MCG/ACT inhaler [id number] Yes Inhale 2 puffs into the lungs every 4 (four) hours as needed for  wheezing or shortness of breath. Valentino Nose, NP Taking Active Self  aspirin 81 MG chewable tablet [id number] Yes Chew 1 tablet (81 mg total) by mouth daily. Filbert Schilder,  NP Taking Active   Azelastine HCl 137 MCG/SPRAY SOLN [id number] Yes Place 1 spray into both nostrils daily as needed (allergies/sinus issues.). [provider] Taking Active Self  Capsaicin 0.025 % LOTN [id number] Yes Apply 1 Application topically 4 (four) times daily as needed. Apply to the affected area 4 times daily as needed for itching. Leath-Warren, Sadie Haber, NP Taking Active Self  clobetasol (TEMOVATE) 0.05 % external solution [id number] Yes Apply 1 application topically 2 (two) times daily.  Patient taking differently: Apply 1 application  topically 2 (two) times daily as needed (psoriasis).   , Velna Hatchet, MD Taking Active Self  COSENTYX SENSOREADY, 300 MG, 150 MG/ML Ivory Broad [id number] Yes Inject 300 mg into the skin every 30 (thirty) days. [provider] Taking Active Self  diclofenac (FLECTOR) 1.3 % PTCH [id number] Yes Place 1 patch onto the skin 2 (two) times daily.  Patient taking differently: Place 1 patch onto the skin at bedtime as needed (neuropathy pain.).   [place], Max T, DPM Taking Active Self  DULoxetine (CYMBALTA) 30 MG capsule [id number] Yes TAKE 1 CAPSULE(30 MG) BY MOUTH DAILY  Patient taking differently: Take 30 mg by mouth at bedtime.   Hyatt, Max T, DPM Taking Active Self  esomeprazole (NEXIUM) 20 MG capsule [id number] Yes Take 20 mg by mouth in the morning and at bedtime. [provider] Taking Active Self  Evolocumab (REPATHA SURECLICK) 140 MG/ML Ivory Broad [id number] Yes Inject 140 mg [place] every two weeks Mort Sawyers, FNP Taking Active Self  furosemide (LASIX) 20 MG tablet [id number] Yes Take 1 tablet (20 mg total) by mouth daily as needed. Mallipeddi, Vishnu P, MD Taking Active Self  gabapentin (NEURONTIN) 600 MG tablet [id number] Yes TAKE 2 TABLETS BY MOUTH IN THE MORNING  AND AT [facility], Max T, DPM Taking Active Self  glucose blood (FREESTYLE LITE) test strip [id number]  Use as instructed to monitor FSBS 1x daily. Dx: E11.9 Mort Sawyers, FNP  Active Self  glucose monitoring kit (FREESTYLE) monitoring kit [id number]  1 each by Does not apply route as needed for other. Dispense one glucometer of choice, testing strips/lancets for once per day glucose checks.  QS for 1 month, 11 refills. Shade Flood, MD  Active Self  Lancets (FREESTYLE) lancets [id number]  Use as instructed Salley Scarlet, MD  Active Self  lisinopril (ZESTRIL) 10 MG tablet [id number] Yes Take 10 mg by mouth every evening. [provider] Taking Active Self           Med Note Gunnar Fusi, MELISSA R   Fri Jan 31, 2023 12:30 PM)    metoprolol succinate (TOPROL-XL) 50 MG 24 hr tablet [id number] Yes Take 1 tablet (50 mg total) by mouth daily. Take with or immediately following a meal. Filbert Schilder, NP Taking Active   mometasone (NASONEX) 50 MCG/ACT nasal spray [id number] Yes Place 2 sprays into the nose daily as needed (allergies). [provider] Taking Active Self  Semaglutide, 2 MG/DOSE, 8 MG/3ML SOPN [id number] Yes Inject 2 mg as directed once a week. Mort Sawyers, FNP Taking Active             SDOH:  (Social Determinants of Health) assessments and interventions performed: No SDOH Interventions    Flowsheet Row Care Coordination from 02/19/2023 in [facility] Coordination Clinical Support from 02/10/2023 in [facility] [place] Telephone from 02/07/2023 in [facility] Care Coordination Clinical Support from 02/05/2022 in [facility] [place]  SDOH Interventions      Food Insecurity Interventions Intervention Not Indicated Intervention Not Indicated Intervention Not Indicated Intervention Not Indicated  Housing Interventions Intervention Not Indicated Intervention Not Indicated  Intervention Not Indicated Intervention Not Indicated  Transportation Interventions Intervention Not Indicated Intervention Not Indicated Intervention Not Indicated Intervention Not Indicated  Utilities Interventions -- Intervention Not Indicated -- --  Alcohol Usage Interventions -- Intervention Not Indicated (Score <7) -- --  Financial Strain Interventions -- Intervention Not Indicated -- Intervention Not Indicated  Physical Activity Interventions -- Patient Refused, Other (Comments) -- Intervention Not Indicated, Patient Refused  Stress Interventions -- Intervention Not Indicated -- Intervention Not Indicated  Social Connections Interventions -- Intervention Not Indicated -- Intervention Not Indicated       Medication Assistance: None required.  Patient affirms current coverage meets needs.  Medication Access: Within the past 30 days, how often has patient missed a dose of medication? 0 Is a pillbox or other method used to improve adherence? Yes  Factors that may affect medication adherence? no barriers identified Are meds synced by current pharmacy? No  Are meds delivered by current pharmacy? Yes  Does patient experience delays in picking up medications due to transportation concerns? No   Upstream Services Reviewed: Is patient disadvantaged to use [facility] Pharmacy?: Yes  Current Rx insurance plan: BCBS Name and location of Current pharmacy:  [id number] - [place], [place] - [address] AT [facility] [address] [facility] [address] [place] [address] Phone: [phone] Fax: [phone]  [facility] - [place], [place] - [address] to Registered [facility] [address] [place] [address] Phone: [phone] Fax: [phone]  [facility] Pharmacy services reviewed with patient today?: No  Patient requests to transfer care to [facility] Pharmacy?: No  Reason patient declined to change  pharmacies: Disadvantaged due to insurance/mail order  Compliance/Adherence/Medication fill history: Care Gaps: None  Star-Rating Drugs: Lisinopril - PDC 0% - LF 11/16/21 x 90 ds Ozempic - PDC 82%    ASSESSMENT / PLAN   Hypertension / Cardiomyopathy (BP goal <130/80) -Controlled- BP at goal in recent OV -Recent diagnosis of mitral valve/aortic valve stenosis and hypertrophic cardiomyopathy (12/2022); pt underwent TAVR 02/06/23 with improved ECHO findings -Current home readings: "good" -Current treatment: Lisinopril 20 mg - 1/2 tab daily ([place])- Appropriate, Effective, Safe, Accessible Metoprolol succinate 50 mg daily  Furosemide 20 mg PRN -Medications previously tried: n/a  -Educated on BP goals and benefits of medications for prevention of heart attack, stroke and kidney damage; Importance of home blood pressure monitoring; -Counseled to monitor BP at home daily -Recommended to continue current medication  Hyperlipidemia: (LDL goal < 70) -Uncontrolled- LDL 204 (11/2022) very high; she has not tolerated multiple statins; she was prescribed Repatha last month, but she picked up the pre-filled syringe and is afraid to inject this herself, she would prefer the auto-injector version -ASCVD risk 29% is very high; hx statin intolerance -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 -Current treatment: Repatha 140 mg q14 days- not started -Medications previously tried: ezetimibe, fenofibrate, rosuvastatin 5 mg weekly, simvastatin 20 mg, pravastatin -Educated on Cholesterol goals; Benefits of Repatha for LDL lowering and ASCVD risk reduction; -Recommend to switch to [employer] auto-injector; advised pt to set up office visit with me for device training  Diabetes (A1c goal <7%) -Controlled - A1c 6.0% (11/2022) at goal; pt is doing well with Ozempic, though has not had any significant  weight loss -Current home glucose readings: Fasting: 114, 111, 71, 101, 121, 118, 94, 86, 134, 109 -Denies  hypoglycemic/hyperglycemic symptoms -Current medications: Ozempic 2 mg weekly - Appropriate, Effective, Safe, Accessible Testing supplies -Medications previously tried: Trulicity, Jardiance (yeast infxn), glipizide, Victoza, metformin (GI), Ozempic 1 mg (GI) -Current meal patterns: increased fruit and vegetables, water -Current exercise: walking -Educated on A1c and blood sugar goals;Complications of diabetes including kidney damage, retinal damage, and cardiovascular disease; -Assessed pt finances - she is able to pay for Ozempic currently ($47/month); discussed PAP is available if needed -Recommended to continue current medication  Neuropathy (Goal: manage symptoms) -Controlled - per pt report -Follows with DPM Hyatt -Current treatment  Duloxetine 30 mg daily - Appropriate, Effective, Safe, Accessible Gabapentin 600 mg - 2 cap BID - Appropriate, Effective, Safe, Accessible -Medications previously tried: n/a  -Recommended to continue current medication  GERD (Goal: manage symptoms) -Controlled - per pt report -Hx esophageal stretching -Current treatment  Esomeprazole 40 mg BID - Appropriate, Effective, Safe, Accessible -Medications previously tried: n/a  -Recommended to continue current medication  Health Maintenance -Vaccine gaps: Shingrix, TDAP    Al Corpus, PharmD, BCACP Clinical Pharmacist [facility] at [facility] [phone]

## 2023-03-03 NOTE — Telephone Encounter (Signed)
Pt c/o Shortness Of Breath: STAT if SOB developed within the last 24 hours or pt is noticeably SOB on the phone  1. Are you currently SOB (can you hear that pt is SOB on the phone)?   No  2. How long have you been experiencing SOB?   Within the last week or so  3. Are you SOB when sitting or when up moving around?    When moving around  4. Are you currently experiencing any other symptoms?   No   Patient stated she has been feeling very tired even though she has been walking more.  Patient wants to know if this is normal after her procedure.

## 2023-03-03 NOTE — Telephone Encounter (Signed)
Patient had S/P TAVR on 4/3 with Dr. Excell Seltzer. Patient has been experiencing more sob since having procedure and is questioning if this is normal.   Please advise.

## 2023-03-03 NOTE — Telephone Encounter (Signed)
Care Management & Coordination Services Outreach Note  03/03/2023 Name: Abigail [name] MRN: [id number] DOB: 1951-04-10  Referred by: Mort Sawyers, FNP  Patient had a phone appointment scheduled with clinical pharmacist today.  An unsuccessful telephone outreach was attempted today. The patient was referred to the pharmacist for assistance with medications, care management and care coordination.   Patient will NOT be penalized in any way for missing a Care Management & Coordination Services appointment. The no-show fee does not apply.  If possible, a message was left to return call to: [phone] or to [name].  Al Corpus, PharmD, BCACP Clinical Pharmacist [facility] [phone]

## 2023-03-06 ENCOUNTER — Other Ambulatory Visit: Payer: Self-pay | Admitting: Internal Medicine

## 2023-03-06 MED ORDER — DILTIAZEM HCL 60 MG PO TABS: 60.0000 mg | ORAL_TABLET | Freq: Three times a day (TID) | ORAL | 1 refills | Status: DC

## 2023-03-06 NOTE — Telephone Encounter (Signed)
I spoke with patient and discussed Dr.Mallipeddi's instructions. Patient wants only a 30 day supply e-scribed to [facility] to start.  She will call us back with update on symptoms.

## 2023-03-11 NOTE — Progress Notes (Addendum)
[facility]   MULTIDISCIPLINARY HEART VALVE CLINIC                                     Cardiology Office Note:    Date:  03/13/2023   ID:  Abigail [name], DOB 1951/01/01, MRN [id number]  PCP:  Mort Sawyers, FNP  [facility] Cardiologist:  Marjo Bicker, MD /Dr. Excell Seltzer, MD & Dr. Leafy Ro, MD (TAVR)   Referring MD: Mort Sawyers, FNP   Chief Complaint  Patient presents with   Follow-up    1 month s/p TAVR   History of Present Illness:    Abigail [name] is a 72 y.o. female with a hx of HTN, HLD, DM2, COPD, HCM, and severe aortic stenosis who presented to [facility] on 12/07/22 for planned TAVR who is being seen today for one month follow up.    [name] is followed by Dr. Jenene Slicker for her cardiology care after being evaluated by her PCP for worsening SOB and DOE. Echocardiogram performed 12/09/22 given her symptoms that showed LVEF 65 to 70%, severe asymmetric basal septal hypertrophy measuring 1.9 cm, mild SAM of the anterior MV leaflet, dynamic subvalvular gradient, peak gradient at least 25 mmHg, G1 DD, normal RV systolic function, severely dilated LA, mild mitral valve stenosis, severe stenosis of aortic valve with mean gradient 51 mmHg, peak gradient 88.6 mmHg, valve area 0.73 cm (aortic valve area by VTI was 1.04 cm and mean gradient 24 mmHg on 04/2021 study). She was referred to our structural heart team for further evaluation of her AS.    R/LHC showed nonobstructive CAD with no flow-limiting coronary lesions. Invasive catheter pullback demonstrated a mean transaortic gradient of 61 mmHg and calculated AVA of 0.6 square cm. CTA studies show an aortic valve Ca score of 1941, annular measurements suitable for a 23 mm Sapien 3 Ultra valve, and suitable iliofemoral vessels for transfemoral access.    The patient was then evaluated by the multidisciplinary valve team and felt to have severe, symptomatic aortic stenosis and to be a suitable candidate and is now s/p successful  TAVR with a 23 mm Edwards Sapien 3 THV via the TF approach on 02/05/23. Post operative echo showed a mean gradient at and peak at 26.76mmHg. 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   Patient was seen by Dr. Jenene Slicker for [facility] follow up and was doing well from a CV standpoint with no acute issues. Today she is here with her husband and reports that she continues to do well overal however has noticed more SOB with activities since she was last seen. She called Dr. Antoine Poche office and was prescribed Diltiazem 60mg  TID. She has not noticed much change with this addition. She does not appear volume overloaded and has not needed PRN Lasix. She has been having increased nasal congestion with post nasal drip and has suffered greatly from allergies in the past but is not on an antihistamine. She denies chest pain, palpitations, LE edema, dizziness, bleeding in stool or urine, dizziness, or syncope.   Past Medical History:  Diagnosis Date   Allergy    Anxiety    Arthritis    hands, knees   Cancer (HCC)    Cataract    surgery to remove -bilateral   COPD (chronic obstructive pulmonary disease) (  HCC)    Depression    Diabetes mellitus (HCC)    a. A1c 6.6 in 05/2013 indicating new dx.   Fatty liver    Female stress incontinence    GERD (gastroesophageal reflux disease)    Heart murmur    no problems   Hemorrhoids    History of kidney stones    surgery to remove   Hyperlipidemia    Hyperplastic colon polyp 06/07/2008   Hypertension    Hypertriglyceridemia    IBS (irritable bowel syndrome)    Lung nodule    a. 4mm left lung nodule by CT 05/2013.   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 Excell Seltzer and Dr. Leafy Ro   SCCA (squamous cell carcinoma) of skin  01/16/2022   Left Malar Cheek (in situ) (tx p bx)   Skin cancer    Squamous cell carcinoma of skin 01/16/2022   Right Breast (in situ) (curet and 5FU)    Past Surgical History:  Procedure Laterality Date   CATARACT EXTRACTION W/PHACO Left 10/06/2014   Procedure: CATARACT EXTRACTION PHACO AND INTRAOCULAR LENS PLACEMENT LEFT EYE;  Surgeon: Gemma Payor, MD;  Location: [facility];  Service: Ophthalmology;  Laterality: Left;  CDE 7.35   CATARACT EXTRACTION W/PHACO Right 11/14/2014   Procedure: CATARACT EXTRACTION PHACO AND INTRAOCULAR LENS PLACEMENT RIGHT EYE;  Surgeon: Gemma Payor, MD;  Location: [facility];  Service: Ophthalmology;  Laterality: Right;  CDE:6.39   COLONOSCOPY  06/2008   hx polyps   CYSTOSCOPY W/ RETROGRADES Right 01/21/2017   Procedure: CYSTOSCOPY WITH RETROGRADE PYELOGRAM;  Surgeon: Vanna Scotland, MD;  Location: [facility] ORS;  Service: Urology;  Laterality: Right;   CYSTOSCOPY/URETEROSCOPY/HOLMIUM LASER/STENT PLACEMENT Left 01/21/2017   Procedure: CYSTOSCOPY/URETEROSCOPY/HOLMIUM LASER/STENT PLACEMENT;  Surgeon: Vanna Scotland, MD;  Location: [facility] OR[facility];  Service: Urology;  Laterality: Left;   DORSAL COMPARTMENT RELEASE Left 06/15/2015   Procedure: LEFT WRIST DEQUERVAINS;  Surgeon: Loreta Ave, MD;  Location: [facility];  Service: Orthopedics;  Laterality: Left;   HERNIA REPAIR     INTRAOPERATIVE TRANSTHORACIC ECHOCARDIOGRAM N/A 02/05/2023   Procedure: INTRAOPERATIVE TRANSTHORACIC ECHOCARDIOGRAM;  Surgeon: Tonny Bollman, MD;  Location: [facility] INVASIVE CV LAB;  Service: Open Heart Surgery;  Laterality: N/A;   MOUTH SURGERY     tooth ext   NASAL SINUS SURGERY     RIGHT/LEFT HEART CATH AND CORONARY ANGIOGRAPHY N/A 01/08/2023   Procedure: RIGHT/LEFT HEART CATH AND CORONARY ANGIOGRAPHY;  Surgeon: Tonny Bollman, MD;  Location: [facility] INVASIVE CV LAB;  Service: Cardiovascular;  Laterality: N/A;   SHOULDER SURGERY     right   TONSILLECTOMY     TRANSCATHETER AORTIC VALVE REPLACEMENT,  TRANSFEMORAL Right 02/05/2023   Procedure: Transcatheter Aortic Valve Replacement, Transfemoral;  Surgeon: Tonny Bollman, MD;  Location: [facility] INVASIVE CV LAB;  Service: Open Heart Surgery;  Laterality: Right;   UPPER GI ENDOSCOPY     normal per patient   VENTRAL HERNIA REPAIR      Current Medications: Current Meds  Medication Sig   albuterol (VENTOLIN HFA) 108 (90 Base) MCG/ACT inhaler Inhale 2 puffs into the lungs every 4 (four) hours as needed for wheezing or shortness of breath.   amoxicillin (AMOXIL) 500 MG tablet Take 4 tablets (2,000 mg total) by mouth as directed. 1 HOUR PRIOR TO DENTAL APPOINTMENTS   aspirin 81 MG chewable tablet Chew 1 tablet (81  mg total) by mouth daily.   Azelastine HCl 137 MCG/SPRAY SOLN Place 1 spray into both nostrils daily as needed (allergies/sinus issues.).   Capsaicin 0.025 % LOTN Apply 1 Application topically 4 (four) times daily as needed. Apply to the affected area 4 times daily as needed for itching.   clobetasol (TEMOVATE) 0.05 % external solution Apply 1 application topically 2 (two) times daily. (Patient taking differently: Apply 1 application  topically 2 (two) times daily as needed (psoriasis).)   COSENTYX SENSOREADY, 300 MG, 150 MG/ML SOAJ Inject 300 mg into the skin every 30 (thirty) days.   diclofenac (FLECTOR) 1.3 % PTCH Place 1 patch onto the skin 2 (two) times daily. (Patient taking differently: Place 1 patch onto the skin at bedtime as needed (neuropathy pain.).)   diltiazem (CARDIZEM CD) 240 MG 24 hr capsule Take 1 capsule (240 mg total) by mouth daily.   DULoxetine (CYMBALTA) 30 MG capsule TAKE 1 CAPSULE(30 MG) BY MOUTH DAILY (Patient taking differently: Take 30 mg by mouth at bedtime.)   esomeprazole (NEXIUM) 20 MG capsule Take 20 mg by mouth in the morning and at bedtime.   Evolocumab (REPATHA SURECLICK) 140 MG/ML SOAJ Inject 140 mg [place] every two weeks   furosemide (LASIX) 20 MG tablet Take 1 tablet (20 mg total) by mouth daily as needed.    gabapentin (NEURONTIN) 600 MG tablet TAKE 2 TABLETS BY MOUTH IN THE MORNING AND AT NIGHT   glucose blood (FREESTYLE LITE) test strip Use as instructed to monitor FSBS 1x daily. Dx: E11.9   glucose monitoring kit (FREESTYLE) monitoring kit 1 each by Does not apply route as needed for other. Dispense one glucometer of choice, testing strips/lancets for once per day glucose checks.  QS for 1 month, 11 refills.   Lancets (FREESTYLE) lancets Use as instructed   lisinopril (ZESTRIL) 10 MG tablet Take 10 mg by mouth every evening.   metoprolol succinate (TOPROL-XL) 50 MG 24 hr tablet Take 1 tablet (50 mg total) by mouth daily. Take with or immediately following a meal.   mometasone (NASONEX) 50 MCG/ACT nasal spray Place 2 sprays into the nose daily as needed (allergies).   Semaglutide, 2 MG/DOSE, 8 MG/3ML SOPN Inject 2 mg as directed once a week.   [DISCONTINUED] diltiazem (CARDIZEM) 60 MG tablet Take 1 tablet (60 mg total) by mouth 3 (three) times daily.     Allergies:   Statins and Cinnamon   Social History   Socioeconomic History   Marital status: Married    Spouse name: Lyda Jester   Number of children: 2   Years of education: trade school   Highest education level: Not on file  Occupational History   Occupation: retired  Tobacco Use   Smoking status: Never   Smokeless tobacco: Never  Vaping Use   Vaping Use: Never used  Substance and Sexual Activity   Alcohol use: No   Drug use: No   Sexual activity: Yes    Birth control/protection: Post-menopausal  Other Topics Concern   Not on file  Social History Narrative   01/24/22   From: the area   Living: with Lyda Jester, husband (1968)   Work: retired - school bus Printmaker      Family: 2 children - Letta Median and Hilda Lias - 4 grandchildren      Enjoys: farm life, play with grandchildren, travel      Exercise: stairs at home 20 times a day, walking the property   Diet: not following diabetic diet but trying to  do better      Safety   Seat  belts: Yes    Guns: Yes  and secure   Safe in relationships: Yes       Social Determinants of Health   Financial Resource Strain: Low Risk  (02/10/2023)   Overall Financial Resource Strain (CARDIA)    Difficulty of Paying Living Expenses: Not hard at all  Food Insecurity: No Food Insecurity (02/19/2023)   Hunger Vital Sign    Worried About Running Out of Food in the Last Year: Never true    Ran Out of Food in the Last Year: Never true  Transportation Needs: No Transportation Needs (02/19/2023)   PRAPARE - Administrator, Civil Service (Medical): No    Lack of Transportation (Non-Medical): No  Physical Activity: Inactive (02/10/2023)   Exercise Vital Sign    Days of Exercise per Week: 0 days    Minutes of Exercise per Session: 0 min  Stress: No Stress Concern Present (02/10/2023)   [employer] of Occupational Health - Occupational Stress Questionnaire    Feeling of Stress : Not at all  Social Connections: Moderately Integrated (02/10/2023)   Social Connection and Isolation Panel [NHANES]    Frequency of Communication with Friends and Family: More than three times a week    Frequency of Social Gatherings with Friends and Family: More than three times a week    Attends Religious Services: More than 4 times per year    Active Member of [employer] or Organizations: No    Attends Banker Meetings: Never    Marital Status: Married    Family History: The patient's family history includes Breast cancer in her mother; Colon polyps in her maternal aunt; Heart disease in her maternal grandfather. There is no history of Kidney cancer, Bladder Cancer, Colon cancer, Stomach cancer, Rectal cancer, Pancreatic cancer, or Esophageal cancer.  ROS:   Please see the history of present illness.    All other systems reviewed and are negative.  EKGs/Labs/Other Studies Reviewed:    The following studies were reviewed today:  Echocardiogram 03/12/23:   1. Left ventricular ejection  fraction, by estimation, is 70 to 75%. The  left ventricle has hyperdynamic function. The left ventricle has no  regional wall motion abnormalities. There is moderate asymmetric left  ventricular hypertrophy of the basal-septal   segment. Left ventricular diastolic parameters are indeterminate.   2. Right ventricular systolic function is normal. The right ventricular  size is normal. There is normal pulmonary artery systolic pressure. The  estimated right ventricular systolic pressure is 26.8 mmHg.   3. The mitral valve is degenerative. Trivial mitral valve regurgitation.  No evidence of mitral stenosis. The mean mitral valve gradient is 3.0 mmHg  with average heart rate of 69 bpm. Severe mitral annular calcification.   4. The aortic valve has been repaired/replaced. Aortic valve  regurgitation is not visualized. There is a 23 mm Edwards Sapien  prosthetic (TAVR) valve present in the aortic position. Procedure Date:  02/05/23. Echo findings are consistent with normal  structure and function of the aortic valve prosthesis. Aortic valve mean  gradient measures 14.4 mmHg.   5. The inferior vena cava is normal in size with greater than 50%  respiratory variability, suggesting right atrial pressure of 3 mmHg.    TAVR OPERATIVE NOTE     Date of Procedure:                02/05/2023   Preoperative Diagnosis:  Severe Aortic Stenosis    Postoperative Diagnosis:    Same    Procedure:        Transcatheter Aortic Valve Replacement - Percutaneous Transfemoral Approach             Edwards Sapien 3 Ultra Resilia THV (size 23 mm, serial # [id number])              Co-Surgeons:                        Eugenio Hoes, MD and Tonny Bollman, MD   Anesthesiologist:                  Ileene Hutchinson, DO   Echocardiographer:              Thurmon Fair, MD   Pre-operative Echo Findings: Severe aortic stenosis Hyperdynamic left ventricular systolic function   Post-operative Echo Findings: No  paravalvular leak Unchanged left ventricular systolic function   ____________   Echo 02/06/23:    1. Ther is a mid cavitary gradient. Peak gradient 3.92 m/s. Mean gradeint  62 mmHg. With Valsalva, peak velocity increases to 4.05 m/s and peak  gradient 66 mmHg. Left ventricular ejection fraction, by estimation, is  >75%. The left ventricle has  hyperdynamic function. The left ventricle has no regional wall motion  abnormalities. There is severe concentric left ventricular hypertrophy.  Left ventricular diastolic parameters are consistent with Grade I  diastolic dysfunction (impaired relaxation).   2. Right ventricular systolic function is normal. The right ventricular  size is normal.   3. Left atrial size was mildly dilated.   4. Systolic anterior motion of the mitral valve is present. The mitral  valve is degenerative. Trivial mitral valve regurgitation. Mild mitral  stenosis. Moderate to severe mitral annular calcification.   5. The aortic valve is normal in structure. Aortic valve regurgitation is  trivial. No aortic stenosis is present. Echo findings are consistent with  normal structure and function of the aortic valve prosthesis. Aortic valve  mean gradient measures 15.0  mmHg. Aortic valve Vmax measures 2.58 m/s.   6. The inferior vena cava is normal in size with greater than 50%  respiratory variability, suggesting right atrial pressure of 3 mmHg.    EKG:  EKG is not ordered today.    Recent Labs: 02/03/2023: ALT 19 02/06/2023: BUN 8; Creatinine, Ser 0.80; Hemoglobin 11.9; Magnesium 1.7; Platelets 141; Potassium 3.5; Sodium 136   Recent Lipid Panel    Component Value Date/Time   CHOL 282 (H) 11/08/2022 1233   TRIG 224.0 (H) 11/08/2022 1233   HDL 51.00 11/08/2022 1233   CHOLHDL 6 11/08/2022 1233   VLDL 44.8 (H) 11/08/2022 1233   LDLCALC 168 (H) 07/02/2021 1224   LDLDIRECT 204.0 11/08/2022 1233   Physical Exam:    VS:  BP 132/70   Pulse 74   Ht 5\' 2"  (1.575 m)   Wt  181 lb (82.1 kg)   SpO2 94%   BMI 33.11 kg/m     Wt Readings from Last 3 Encounters:  03/12/23 181 lb (82.1 kg)  02/14/23 182 lb (82.6 kg)  02/10/23 180 lb (81.6 kg)    General: Well developed, well nourished, NAD Lungs:Clear to ausculation bilaterally. Cardiovascular: RRR with S1 S2. No murmurs Extremities: No edema. No clubbing or cyanosis. DP/PT pulses 2+ bilaterally Neuro: Alert and oriented. No focal deficits. No facial asymmetry. MAE spontaneously. Psych: Responds to questions appropriately with normal affect.  ASSESSMENT/PLAN:    Severe AS: Patient doing well with NYHA class II symptoms s/p successful TAVR with a 23 mm Edwards Sapien 3 THV via the TF approach on 02/05/23. Post operative echo with a mean gradient at and peak at 26.30mmHg. Higher gradients felt to be secondary to HOCM therefore her metoprolol was increased to 50mg  and she was continued on ASA monotherapy. Echo today with similar gradients at 14.58mmHg and peak at with an AVA by VTI at 2.84cm2. Will increase metoprolol to 75mg  for now with consideration to increase further. Continue ASA monotherapy. Dental SBE discussed and Amoxicillin sent to preferred pharmacy. Follow with Dr. Jenene Slicker in the next 4-6 months then our team in one year.     HTN: Stable however will increase beta blocker today in the setting of MAC with higher AV gradients    HLD: Continue Repatha.     HCM: with LV normalization. Increase metoprolol to 75mg  BID. Previously scheduled for cMRI prior to TAVR. Plan to now reschedule this per Dr. Jenene Slicker.   Incidental findings: Small solid pulmonary nodules measuring up to 3 mm. If the patient is high-risk, a non-contrast chest CT can be considered in 12 months. If the patient is low risk, no further follow-up imaging is needed.  Patient reports no prior hx of tobacco use therefore considered low risk and no further imaging required.   ADDEND:  Elevated RAISE score: Patient with elevated  RAISE score on pre TAVR evaluation. Cardiac amyloid labs obtained post procedure and the patient will be scheduled for PYP screening. Plan will be based on results.    Medication Adjustments/Labs and Tests Ordered: Current medicines are reviewed at length with the patient today.  Concerns regarding medicines are outlined above.  No orders of the defined types were placed in this encounter.  Meds ordered this encounter  Medications   amoxicillin (AMOXIL) 500 MG tablet    Sig: Take 4 tablets (2,000 mg total) by mouth as directed. 1 HOUR PRIOR TO DENTAL APPOINTMENTS    Dispense:  12 tablet    Refill:  6   diltiazem (CARDIZEM CD) 240 MG 24 hr capsule    Sig: Take 1 capsule (240 mg total) by mouth daily.    Dispense:  90 capsule    Refill:  3    Patient Instructions  Medication Instructions:  Your physician has recommended you make the following change in your medication:   INCREASE CARDIZEM 240 MG DAILY Start Amoxicillin 500 mg, take 4 tablets by mouth 1 hour prior to dental procedures and cleanings.   *If you need a refill on your cardiac medications before your next appointment, please call your pharmacy*   Lab Work: NONE If you have labs (blood work) drawn today and your tests are completely normal, you will receive your results only by: MyChart Message (if you have MyChart) OR A paper copy in the mail If you have any lab test that is abnormal or we need to change your treatment, we will call you to review the results.   Testing/Procedures: NONE   Follow-Up: At [facility], you and your health needs are our priority.  As part of our continuing mission to provide you with exceptional heart care, we have created designated Provider Care Teams.  These Care Teams include your primary Cardiologist (physician) and Advanced Practice Providers (APPs -  Physician Assistants and Nurse Practitioners) who all work together to provide you with the care you need, when you need  it.  We recommend signing up for the patient portal called "MyChart".  Sign up information is provided on this After Visit Summary.  MyChart is used to connect with patients for Virtual Visits (Telemedicine).  Patients are able to view lab/test results, encounter notes, upcoming appointments, etc.  Non-urgent messages can be sent to your provider as well.   To learn more about what you can do with MyChart, go to [email or web address].    Your next appointment:   KEEP SCHEDULED FOLLOW-UP   Signed, Georgie Chard, NP  03/13/2023 10:07 AM    [facility]

## 2023-03-12 ENCOUNTER — Ambulatory Visit (INDEPENDENT_AMBULATORY_CARE_PROVIDER_SITE_OTHER): Payer: Medicare Other | Admitting: Cardiology

## 2023-03-12 ENCOUNTER — Ambulatory Visit: Payer: Medicare Other | Attending: Cardiology

## 2023-03-12 VITALS — BP 132/70 | HR 74 | Ht 62.0 in | Wt 181.0 lb

## 2023-03-12 DIAGNOSIS — I10 Essential (primary) hypertension: Secondary | ICD-10-CM | POA: Diagnosis not present

## 2023-03-12 DIAGNOSIS — R0609 Other forms of dyspnea: Secondary | ICD-10-CM | POA: Diagnosis not present

## 2023-03-12 DIAGNOSIS — I422 Other hypertrophic cardiomyopathy: Secondary | ICD-10-CM | POA: Insufficient documentation

## 2023-03-12 DIAGNOSIS — E1149 Type 2 diabetes mellitus with other diabetic neurological complication: Secondary | ICD-10-CM

## 2023-03-12 DIAGNOSIS — R911 Solitary pulmonary nodule: Secondary | ICD-10-CM

## 2023-03-12 DIAGNOSIS — I350 Nonrheumatic aortic (valve) stenosis: Secondary | ICD-10-CM

## 2023-03-12 DIAGNOSIS — Z7985 Long-term (current) use of injectable non-insulin antidiabetic drugs: Secondary | ICD-10-CM

## 2023-03-12 DIAGNOSIS — Z952 Presence of prosthetic heart valve: Secondary | ICD-10-CM

## 2023-03-12 LAB — ECHOCARDIOGRAM COMPLETE
AR max vel: 2.59 cm2
AV Area VTI: 2.84 cm2
AV Area mean vel: 2.38 cm2
AV Mean grad: 14.4 mmHg
AV Peak grad: 24 mmHg
Ao pk vel: 2.45 m/s
Area-P 1/2: 1.27 cm2
Calc EF: 73.8 %
MV VTI: 2.58 cm2
S' Lateral: 2.3 cm
Single Plane A2C EF: 69.9 %
Single Plane A4C EF: 76 %

## 2023-03-12 MED ORDER — DILTIAZEM HCL ER COATED BEADS 240 MG PO CP24: 240.0000 mg | ORAL_CAPSULE | Freq: Every day | ORAL | 3 refills | Status: DC

## 2023-03-12 MED ORDER — AMOXICILLIN 500 MG PO TABS: 2000.0000 mg | ORAL_TABLET | ORAL | 6 refills | Status: DC

## 2023-03-12 NOTE — Patient Instructions (Signed)
Medication Instructions:  Your physician has recommended you make the following change in your medication:   INCREASE CARDIZEM 240 MG DAILY Start Amoxicillin 500 mg, take 4 tablets by mouth 1 hour prior to dental procedures and cleanings.   *If you need a refill on your cardiac medications before your next appointment, please call your pharmacy*   Lab Work: NONE If you have labs (blood work) drawn today and your tests are completely normal, you will receive your results only by: MyChart Message (if you have MyChart) OR A paper copy in the mail If you have any lab test that is abnormal or we need to change your treatment, we will call you to review the results.   Testing/Procedures: NONE   Follow-Up: At [facility], you and your health needs are our priority.  As part of our continuing mission to provide you with exceptional heart care, we have created designated Provider Care Teams.  These Care Teams include your primary Cardiologist (physician) and Advanced Practice Providers (APPs -  Physician Assistants and Nurse Practitioners) who all work together to provide you with the care you need, when you need it.  We recommend signing up for the patient portal called "MyChart".  Sign up information is provided on this After Visit Summary.  MyChart is used to connect with patients for Virtual Visits (Telemedicine).  Patients are able to view lab/test results, encounter notes, upcoming appointments, etc.  Non-urgent messages can be sent to your provider as well.   To learn more about what you can do with MyChart, go to [email or web address].    Your next appointment:   KEEP SCHEDULED FOLLOW-UP

## 2023-03-14 ENCOUNTER — Ambulatory Visit: Payer: Medicare Other

## 2023-03-14 ENCOUNTER — Telehealth: Payer: Self-pay

## 2023-03-14 MED ORDER — METOPROLOL SUCCINATE ER 50 MG PO TB24: 75.0000 mg | ORAL_TABLET | Freq: Every day | ORAL | 3 refills | Status: DC

## 2023-03-14 NOTE — Telephone Encounter (Signed)
-----   Message from Filbert Schilder, NP sent at 03/14/2023  2:40 PM EDT ----- Hi [name], yes I would like her to increase from 50mg  to 75mg . Thank you ----- Message ----- From: Ethelda Chick, RN Sent: 03/13/2023   5:20 PM EDT To: Filbert Schilder, NP  Called patient about results. Patient is currently only taking metoprolol succinate 50 mg daily. Will clarify with provider on increased dosing on metoprolol.

## 2023-03-14 NOTE — Telephone Encounter (Signed)
Called patient with recommendations. Patient verbalized understanding. Will send in new prescription.

## 2023-03-17 ENCOUNTER — Other Ambulatory Visit (INDEPENDENT_AMBULATORY_CARE_PROVIDER_SITE_OTHER): Payer: Medicare Other

## 2023-03-17 DIAGNOSIS — D649 Anemia, unspecified: Secondary | ICD-10-CM | POA: Diagnosis not present

## 2023-03-17 DIAGNOSIS — E7800 Pure hypercholesterolemia, unspecified: Secondary | ICD-10-CM | POA: Diagnosis not present

## 2023-03-17 LAB — LIPID PANEL
Cholesterol: 128 mg/dL (ref 0–200)
HDL: 44.5 mg/dL (ref >39.00)
NonHDL: 83.67
Total CHOL/HDL Ratio: 3
Triglycerides: 204 mg/dL — ABNORMAL HIGH (ref 0.0–149.0)
VLDL: 40.8 mg/dL — ABNORMAL HIGH (ref 0.0–40.0)

## 2023-03-17 LAB — CBC WITH DIFFERENTIAL/PLATELET
Basophils Absolute: 0 10*3/uL (ref 0.0–0.1)
Basophils Relative: 0.4 % (ref 0.0–3.0)
Eosinophils Absolute: 0.2 10*3/uL (ref 0.0–0.7)
Eosinophils Relative: 3.5 % (ref 0.0–5.0)
HCT: 42.1 % (ref 36.0–46.0)
Hemoglobin: 14.6 g/dL (ref 12.0–15.0)
Lymphocytes Relative: 23.8 % (ref 12.0–46.0)
Lymphs Abs: 1.6 10*3/uL (ref 0.7–4.0)
MCHC: 34.8 g/dL (ref 30.0–36.0)
MCV: 86.1 fl (ref 78.0–100.0)
Monocytes Absolute: 0.5 10*3/uL (ref 0.1–1.0)
Monocytes Relative: 7.7 % (ref 3.0–12.0)
Neutro Abs: 4.5 10*3/uL (ref 1.4–7.7)
Neutrophils Relative %: 64.6 % (ref 43.0–77.0)
Platelets: 176 10*3/uL (ref 150.0–400.0)
RBC: 4.89 Mil/uL (ref 3.87–5.11)
RDW: 13.3 % (ref 11.5–15.5)
WBC: 6.9 10*3/uL (ref 4.0–10.5)

## 2023-03-17 LAB — IBC + FERRITIN
Ferritin: 30.1 ng/mL (ref 10.0–291.0)
Iron: 71 ug/dL (ref 42–145)
Saturation Ratios: 20.2 % (ref 20.0–50.0)
TIBC: 351.4 ug/dL (ref 250.0–450.0)
Transferrin: 251 mg/dL (ref 212.0–360.0)

## 2023-03-17 LAB — LDL CHOLESTEROL, DIRECT: Direct LDL: 58 mg/dL

## 2023-03-17 NOTE — Progress Notes (Signed)
Triglycerides still a bit high but some mild improvement.  Continue to work on low cholesterol diet exercise as tolerated.

## 2023-03-21 ENCOUNTER — Ambulatory Visit: Payer: Self-pay

## 2023-03-21 NOTE — Patient Outreach (Signed)
  Care Coordination   03/21/2023 Name: Abigail [name] MRN: [id number] DOB: [birth year]   Care Coordination Outreach Attempts:  An unsuccessful telephone outreach was attempted for a scheduled appointment today. HIPAA compliant message left with call back phone number.   Follow Up Plan:  Additional outreach attempts will be made to offer the patient care coordination information and services.   Encounter Outcome:  No Answer   Care Coordination Interventions:  No, not indicated    [name] [facility] Care Coordination [phone] direct line

## 2023-03-24 ENCOUNTER — Inpatient Hospital Stay (HOSPITAL_COMMUNITY): Admission: RE | Admit: 2023-03-24 | Payer: Medicare Other | Source: Ambulatory Visit

## 2023-03-26 ENCOUNTER — Ambulatory Visit (INDEPENDENT_AMBULATORY_CARE_PROVIDER_SITE_OTHER): Payer: Medicare Other | Admitting: Podiatry

## 2023-03-26 ENCOUNTER — Other Ambulatory Visit: Payer: Self-pay | Admitting: Cardiology

## 2023-03-26 ENCOUNTER — Encounter: Payer: Self-pay | Admitting: Podiatry

## 2023-03-26 DIAGNOSIS — Z952 Presence of prosthetic heart valve: Secondary | ICD-10-CM

## 2023-03-26 DIAGNOSIS — R931 Abnormal findings on diagnostic imaging of heart and coronary circulation: Secondary | ICD-10-CM

## 2023-03-26 DIAGNOSIS — I350 Nonrheumatic aortic (valve) stenosis: Secondary | ICD-10-CM

## 2023-03-26 DIAGNOSIS — M7751 Other enthesopathy of right foot: Secondary | ICD-10-CM

## 2023-03-26 DIAGNOSIS — G5792 Unspecified mononeuropathy of left lower limb: Secondary | ICD-10-CM

## 2023-03-26 DIAGNOSIS — M7752 Other enthesopathy of left foot: Secondary | ICD-10-CM

## 2023-03-26 DIAGNOSIS — G5791 Unspecified mononeuropathy of right lower limb: Secondary | ICD-10-CM | POA: Diagnosis not present

## 2023-03-26 MED ORDER — DULOXETINE HCL 30 MG PO CPEP: 30.0000 mg | ORAL_CAPSULE | Freq: Every day | ORAL | 3 refills | Status: DC

## 2023-03-26 MED ORDER — GABAPENTIN 600 MG PO TABS: ORAL_TABLET | ORAL | 2 refills | Status: DC

## 2023-03-26 MED ORDER — TRIAMCINOLONE ACETONIDE 40 MG/ML IJ SUSP
80.0000 mg | Freq: Once | INTRAMUSCULAR | Status: AC
Start: 2023-03-26 — End: 2023-03-26
  Administered 2023-03-26: 80 mg

## 2023-03-26 NOTE — Progress Notes (Signed)
She presents today for follow-up of her capsulitis and neuritis bilaterally.  She states that she is recently had a new heart valve replaced and she is feeling better as far as that goes however her feet are still painful.  Objective: Some stable oriented x 3 pulses are palpable.  Still has pain on palpation and range of motion of the lesser metatarsal phalangeal joints particularly between the second and third and third and fourth toes.  Assessment: Capsulitis and interdigital neuritis bilateral foot.  Plan: I injected all of these areas today a total of 10 mg per foot was injected of Kenalog and I will follow-up with her in about 4 months

## 2023-04-04 ENCOUNTER — Telehealth (HOSPITAL_COMMUNITY): Payer: Self-pay | Admitting: *Deleted

## 2023-04-04 NOTE — Telephone Encounter (Signed)
Reminder call given for upcoming Amyloid study 04/08/23 at 12:30.

## 2023-04-07 ENCOUNTER — Telehealth (HOSPITAL_COMMUNITY): Payer: Self-pay

## 2023-04-07 NOTE — Telephone Encounter (Signed)
Detailed instructions left on the patient's answering machine. Asked to call back with any questions. S.Braelin Costlow EMTP/CCT 

## 2023-04-08 ENCOUNTER — Telehealth: Payer: Self-pay

## 2023-04-08 ENCOUNTER — Ambulatory Visit (HOSPITAL_COMMUNITY): Payer: Medicare Other | Attending: Cardiology

## 2023-04-08 DIAGNOSIS — Z952 Presence of prosthetic heart valve: Secondary | ICD-10-CM | POA: Diagnosis not present

## 2023-04-08 DIAGNOSIS — R931 Abnormal findings on diagnostic imaging of heart and coronary circulation: Secondary | ICD-10-CM | POA: Diagnosis not present

## 2023-04-08 DIAGNOSIS — I350 Nonrheumatic aortic (valve) stenosis: Secondary | ICD-10-CM

## 2023-04-08 LAB — MYOCARDIAL AMYLOID PLANAR & SPECT: H/CL Ratio: 1.1

## 2023-04-08 MED ORDER — TECHNETIUM TC 99M PYROPHOSPHATE
22.0000 | Freq: Once | INTRAVENOUS | Status: AC
Start: 2023-04-08 — End: 2023-04-08
  Administered 2023-04-08: 22 via INTRAVENOUS

## 2023-04-08 NOTE — Progress Notes (Signed)
Care Management & Coordination Services Pharmacy Team  Reason for Encounter: Appointment Reminder  Contacted patient to confirm telephone appointment with Al Corpus , PharmD on 04/09/23 at 3:45. Unsuccessful outreach. Left voicemail for patient to return call.  Have you seen any other providers since your last visit with PCP? Yes  Podiatry, cardiology    Hospital visits:  None in previous 6 months   Star Rating Drugs:  Medication:  Last Fill: Day Supply Ozempic  03/18/23 28 Lisinopril 10mg  Patient taking husbands supply    Care Gaps: Annual wellness visit in last year? Yes  If Diabetic: Last eye exam / retinopathy screening:UTD Last diabetic foot exam:UTD   Al Corpus, PharmD notified  Burt Knack, [facility] Clinical Pharmacy Assistant [phone]

## 2023-04-09 ENCOUNTER — Encounter: Payer: Medicare Other | Admitting: Pharmacist

## 2023-04-16 ENCOUNTER — Telehealth: Payer: Self-pay

## 2023-04-16 NOTE — Progress Notes (Cosign Needed)
Care Management & Coordination Services Pharmacy Team  Reason for Encounter: Pharmacy Transfer   Spoke with patient on 04/16/2023   Patient left a message stating she would like to get her Repatha from [place] in [place]. Pharmacy contacted and was able to transfer the RX. Called the patient and let her know.   Al Corpus, PharmD notified  Burt Knack, [facility] Clinical Pharmacy Assistant [phone]

## 2023-04-24 ENCOUNTER — Other Ambulatory Visit: Payer: Self-pay | Admitting: Family

## 2023-04-24 ENCOUNTER — Ambulatory Visit: Payer: Self-pay

## 2023-04-24 ENCOUNTER — Telehealth: Payer: Self-pay | Admitting: Internal Medicine

## 2023-04-24 DIAGNOSIS — E1149 Type 2 diabetes mellitus with other diabetic neurological complication: Secondary | ICD-10-CM

## 2023-04-24 NOTE — Telephone Encounter (Signed)
Pt c/o medication issue:  1. Name of Medication:  diltiazem (CARDIZEM CD) 240 MG 24 hr capsule   2. How are you currently taking this medication (dosage and times per day)?   3. Are you having a reaction (difficulty breathing--STAT)?   4. What is your medication issue?  Patient would like to know how she should be taking Diltiazem. Please advise.

## 2023-04-24 NOTE — Patient Outreach (Signed)
  Care Coordination   Follow Up Visit Note   04/24/2023 Name: Abigail [name] MRN: [id number] DOB: [birth year]  Abigail [name] is a 72 y.o. year old female who sees [place], [name], FNP for primary care. I spoke with  [name] by phone today.  What matters to the patients health and wellness today?  Patient states she feels she is doing very well. She denies having shortness of breath with normal activity. She reports noticing SOB with heavier activity.  Patient denies any increase in weight gain or swelling in LE. She reports today's weight is 177 lbs.  Patient states she needs a refill on her Dilitazem.  She states she is taking 1 tablet 2 times per day but bottle states 1 tablet 3 times per day.      Goals Addressed             This Visit's Progress    Continued improvement post TAVR       Interventions Today    Flowsheet Row Most Recent Value  Chronic Disease   Chronic disease during today's visit Other  [status post TAVR]  General Interventions   General Interventions Discussed/Reviewed General Interventions Reviewed, Doctor Visits  [Evaluation of current treatment plan for status post TAVR and patients adherence to plan as established by provider. Assesed for symptoms of SOB, weight gain, LE swelling.]  Doctor Visits Discussed/Reviewed --  [reviewed upcoming provider visits.]  Exercise Interventions   Exercise Discussed/Reviewed Physical Activity  Physical Activity Discussed/Reviewed Physical Activity Reviewed  [assessed for ongoing increase in physical activity]  Education Interventions   Education Provided --  [Advised to continue to monitor weight and record daily. Notify provider for 3 lbs overnight or 5 lbs in a week.]  Pharmacy Interventions   Pharmacy Dicussed/Reviewed Pharmacy Topics Reviewed  [medications reviewed and compliance discussed. Advised to contact cardiology office regarding discrepency with dilitazem frequency.]              SDOH assessments and  interventions completed:  No     Care Coordination Interventions:  Yes, provided   Follow up plan: Follow up call scheduled for 06/11/23    Encounter Outcome:  Pt. Visit Completed   [name] RN,BSN,CCM [facility] Care Coordination [phone] direct line

## 2023-04-24 NOTE — Telephone Encounter (Signed)
Returned pt's call. No answer. Left msg to call back 

## 2023-04-25 ENCOUNTER — Telehealth: Payer: Self-pay | Admitting: Pharmacist

## 2023-04-25 NOTE — Telephone Encounter (Signed)
Called patient to let her know how she should be taking her Diltiazem. Advised her that as of 05/082024 when was at her last visit Georgie Chard increased her dosage to 240 mg daily. She stated that she has 60 mg tablets, she will need to take 4 to total 240 mg. She stated that she will take 2 in the morning and 2 in the evening. Patient verbalized understaning.

## 2023-04-25 NOTE — Progress Notes (Signed)
Contacted patient regarding upcoming appointment with [facility] pharmacist. Per clinical review, no pharmacist appointment needed at this time. Patient denies any medication needs at this time. Appointment canceled.   Catie T. Stiven Kaspar, PharmD, BCACP, CPP Clinical Pharmacist [facility] [phone]  

## 2023-04-30 ENCOUNTER — Encounter: Payer: Medicare Other | Admitting: Pharmacist

## 2023-05-12 ENCOUNTER — Other Ambulatory Visit: Payer: Self-pay | Admitting: Internal Medicine

## 2023-05-29 DIAGNOSIS — L400 Psoriasis vulgaris: Secondary | ICD-10-CM | POA: Diagnosis not present

## 2023-05-29 DIAGNOSIS — L720 Epidermal cyst: Secondary | ICD-10-CM | POA: Diagnosis not present

## 2023-05-29 DIAGNOSIS — L570 Actinic keratosis: Secondary | ICD-10-CM | POA: Diagnosis not present

## 2023-06-11 ENCOUNTER — Ambulatory Visit: Payer: Self-pay

## 2023-06-11 NOTE — Patient Instructions (Addendum)
Visit Information  Thank you for taking time to visit with me today. Please don't hesitate to contact me if I can be of assistance to you.   Following are the goals we discussed today:   Goals Addressed             This Visit's Progress    Continued improvement post TAVR       Interventions Today    Flowsheet Row Most Recent Value  Chronic Disease   Chronic disease during today's visit Other  [status post TAVR]  General Interventions   General Interventions Discussed/Reviewed General Interventions Reviewed, Doctor Visits  [evaluation of current treatment plan status post TAVR and patients adherence to plan as established by provider.  Assessed current weight and for new or ongoing symptoms.]  Doctor Visits Discussed/Reviewed Doctor Visits Reviewed  [name] upcoming provider visits. Advised to keep follow up appointments with providers.]  Exercise Interventions   Exercise Discussed/Reviewed Physical Activity  [Encouraged to exercise such as walking daily and as tolerated.]  Education Interventions   Education Provided Provided Education, Provided Printed Education  [Advised to notify provider for weight gain of 3 lbs overnight or  5 lbs in a week, increase swelling in feet, ankles, legs, or increase in shortness of breath. Education article sent to patient on abdominal bloating.]  Pharmacy Interventions   Pharmacy Dicussed/Reviewed Pharmacy Topics Reviewed  [Medications reviewed.  Advised to take medications as prescribed.]              Our next appointment is by telephone on 07/30/23 at 10 am  Please call the care guide team at [phone] if you need to cancel or reschedule your appointment.   If you are experiencing a Mental Health or Behavioral Health Crisis or need someone to talk to, please call the Suicide and Crisis Lifeline: 988 call 1-800-273-TALK (toll free, 24 hour hotline)  The patient verbalized understanding of instructions, educational materials, and care  plan provided today and agreed to receive a mailed copy of patient instructions, educational materials, and care plan.   [name] RN,BSN,CCM [facility] Care Coordination [phone] direct line  Abdominal Bloating When you have abdominal bloating, your abdomen may feel full, tight, or painful. It may also look bigger than normal or swollen (distended). Common causes of abdominal bloating include: Swallowing air. Constipation. Problems digesting food. Eating too much. Irritable bowel syndrome. This is a condition that affects the large intestine. Lactose intolerance. This is an inability to digest lactose, a natural sugar in dairy products. Celiac disease. This is a condition that affects the ability to digest gluten, a protein found in some grains. Gastroparesis. This is a condition that slows down the movement of food in the stomach and small intestine. It is more common in people with diabetes mellitus. Gastroesophageal reflux disease (GERD). This is a condition that makes stomach acid flow back into the esophagus. Urinary retention. This means that the body is holding onto urine, and the bladder cannot be emptied all the way. Follow these instructions at home: Eating and drinking Avoid eating too much. Try not to swallow air while talking or eating. Avoid eating while lying down. Avoid these foods and drinks: Foods that cause gas, such as broccoli, cabbage, cauliflower, and baked beans. Carbonated drinks. Hard candy. Chewing gum. Medicines Take over-the-counter and prescription medicines only as told by your health care provider. Take probiotic medicines. These medicines contain live bacteria or yeasts that can help digestion. Take coated peppermint oil capsules. General instructions Try to exercise  regularly. Exercise may help to relieve bloating that is caused by gas and relieve constipation. Keep all follow-up visits. This is important. Contact a health care provider if: You  have nausea and vomiting. You have diarrhea. You have abdominal pain. You have unusual weight loss or weight gain. You have severe pain, and medicines do not help. Get help right away if: You have chest pain. You have trouble breathing. You have shortness of breath. You have trouble urinating. You have darker urine than normal. You have blood in your stools or have dark, tarry stools. These symptoms may represent a serious problem that is an emergency. Do not wait to see if the symptoms will go away. Get medical help right away. Call your local emergency services (911 in the [place] Do not drive yourself to the hospital. Summary Abdominal bloating means that the abdomen is swollen. Common causes of abdominal bloating are swallowing air, constipation, and problems digesting food. Avoid eating too much and avoid swallowing air. Avoid foods that cause gas, carbonated drinks, hard candy, and chewing gum. This information is not intended to replace advice given to you by your health care provider. Make sure you discuss any questions you have with your health care provider. Document Revised: 05/23/2020 Document Reviewed: 05/23/2020 Elsevier Patient Education  2024 [employer].

## 2023-06-11 NOTE — Patient Outreach (Signed)
  Care Coordination   Follow Up Visit Note   06/11/2023 Name: Abigail [name] MRN: [id number] DOB: 15-Oct-1951  Abigail [name] is a 72 y.o. year old female who sees [place], [name], FNP for primary care. I spoke with  Abigail [name] by phone today.  What matters to the patients health and wellness today?  Patient states she is doing well.  She states she continues to have mild shortness of breath with increase activity such as walking stair. She denies weight gain, swelling of lower extremities.  Patient reports current weight is 170 lbs down from 177 lbs 04/2023.  She states she remains active by gardening.   Patient reports having more gas now than she did prior to having the TAVR.  She states she is scheduled to see her primary care provider on 06/16/23 and will discuss further.    Goals Addressed             This Visit's Progress    Continued improvement post TAVR       Interventions Today    Flowsheet Row Most Recent Value  Chronic Disease   Chronic disease during today's visit Other  [status post TAVR]  General Interventions   General Interventions Discussed/Reviewed General Interventions Reviewed, Doctor Visits  [evaluation of current treatment plan status post TAVR and patients adherence to plan as established by provider.  Assessed current weight and for new or ongoing symptoms.]  Doctor Visits Discussed/Reviewed Doctor Visits Reviewed  Abigail [name] upcoming provider visits. Advised to keep follow up appointments with providers.]  Exercise Interventions   Exercise Discussed/Reviewed Physical Activity  [Encouraged to exercise such as walking daily and as tolerated.]  Education Interventions   Education Provided Provided Education, Provided Printed Education  [Advised to notify provider for weight gain of 3 lbs overnight or  5 lbs in a week, increase swelling in feet, ankles, legs, or increase in shortness of breath. Education article sent to patient on abdominal bloating.]  Pharmacy  Interventions   Pharmacy Dicussed/Reviewed Pharmacy Topics Reviewed  [Medications reviewed.  Advised to take medications as prescribed.]              SDOH assessments and interventions completed:  No     Care Coordination Interventions:  Yes, provided   Follow up plan: Follow up call scheduled for 07/30/23    Encounter Outcome:  Pt. Visit Completed   [name] RN,BSN,CCM [facility] Care Coordination [phone] direct line

## 2023-06-16 ENCOUNTER — Encounter: Payer: Self-pay | Admitting: Family

## 2023-06-16 ENCOUNTER — Ambulatory Visit (INDEPENDENT_AMBULATORY_CARE_PROVIDER_SITE_OTHER): Payer: Medicare Other | Admitting: Family

## 2023-06-16 VITALS — BP 130/74 | HR 88 | Temp 98.6°F | Ht 62.0 in | Wt 186.0 lb

## 2023-06-16 DIAGNOSIS — J0110 Acute frontal sinusitis, unspecified: Secondary | ICD-10-CM | POA: Diagnosis not present

## 2023-06-16 DIAGNOSIS — E1149 Type 2 diabetes mellitus with other diabetic neurological complication: Secondary | ICD-10-CM

## 2023-06-16 LAB — POCT GLYCOSYLATED HEMOGLOBIN (HGB A1C): Hemoglobin A1C: 6 % — AB (ref 4.0–5.6)

## 2023-06-16 MED ORDER — DOXYCYCLINE HYCLATE 100 MG PO TABS
100.0000 mg | ORAL_TABLET | Freq: Two times a day (BID) | ORAL | 0 refills | Status: AC
Start: 2023-06-16 — End: 2023-06-26

## 2023-06-16 NOTE — Assessment & Plan Note (Signed)
A1c today in office stable.  Work on diabetic diet and exercise as tolerated. Yearly foot exam, and annual eye exam.

## 2023-06-16 NOTE — Progress Notes (Signed)
Established Patient Office Visit  Subjective:      CC:  Chief Complaint  Patient presents with   Diabetes   Sinusitis    Pressure and drainage     HPI: Abigail [name] is a 72 y.o. female presenting on 06/16/2023 for Diabetes and Sinusitis (Pressure and drainage ) . Some edema on her left lower ankle. She states she is not out as often as she should and has been sitting more than often especially with the hot weather. She does still try to walk around the house inside here and there. She denies pain in the lower extremity.   Todays weight 185 pounds   Wt Readings from Last 3 Encounters:  03/12/23 181 lb (82.1 kg)  02/14/23 182 lb (82.6 kg)  02/10/23 180 lb (81.6 kg)   S/p TAVR seeing cardiology regularly, improved DOE post procedure. Doing well. She denies any sudden or unexpected weight gain. Just mild edema left lower ankle.   Prediabetes:  Lab Results  Component Value Date   HGBA1C 6.0 (A) 06/16/2023   Acute concerns:  Worsening dry cough. Increased nasal congestion and sinus pressure, more so than usual. This has been going on now for three weeks.coughing up clear phlegm. Some slight increased sob with coughing. Using flonase otc CXR 4/24 no acute findings.     Social history:  Relevant past medical, surgical, family and social history reviewed and updated as indicated. Interim medical history since our last visit reviewed.  Allergies and medications reviewed and updated.  DATA REVIEWED: CHART IN EPIC     ROS: Negative unless specifically indicated above in HPI.    Current Outpatient Medications:    albuterol (VENTOLIN HFA) 108 (90 Base) MCG/ACT inhaler, Inhale 2 puffs into the lungs every 4 (four) hours as needed for wheezing or shortness of breath., Disp: 1 each, Rfl: 1   amoxicillin (AMOXIL) 500 MG tablet, Take 4 tablets (2,000 mg total) by mouth as directed. 1 HOUR PRIOR TO DENTAL APPOINTMENTS, Disp: 12 tablet, Rfl: 6   aspirin 81 MG chewable tablet,  Chew 1 tablet (81 mg total) by mouth daily., Disp: , Rfl:    Azelastine HCl 137 MCG/SPRAY SOLN, Place 1 spray into both nostrils daily as needed (allergies/sinus issues.)., Disp: , Rfl:    COSENTYX SENSOREADY, 300 MG, 150 MG/ML SOAJ, Inject 300 mg into the skin every 30 (thirty) days., Disp: , Rfl:    diclofenac (FLECTOR) 1.3 % PTCH, Place 1 patch onto the skin 2 (two) times daily. (Patient taking differently: Place 1 patch onto the skin at bedtime as needed (neuropathy pain.).), Disp: 60 patch, Rfl: 6   diltiazem (CARDIZEM CD) 240 MG 24 hr capsule, Take 1 capsule (240 mg total) by mouth daily., Disp: 90 capsule, Rfl: 3   doxycycline (VIBRA-TABS) 100 MG tablet, Take 1 tablet (100 mg total) by mouth 2 (two) times daily for 10 days., Disp: 20 tablet, Rfl: 0   DULoxetine (CYMBALTA) 30 MG capsule, Take 1 capsule (30 mg total) by mouth at bedtime., Disp: 30 capsule, Rfl: 3   esomeprazole (NEXIUM) 20 MG capsule, Take 20 mg by mouth in the morning and at bedtime., Disp: , Rfl:    Evolocumab (REPATHA SURECLICK) 140 MG/ML SOAJ, Inject 140 mg [place] every two weeks, Disp: 2 mL, Rfl: 5   furosemide (LASIX) 20 MG tablet, Take 1 tablet (20 mg total) by mouth daily as needed., Disp: 30 tablet, Rfl: 3   gabapentin (NEURONTIN) 600 MG tablet, TAKE 2 TABLETS BY MOUTH  IN THE MORNING AND AT NIGHT, Disp: 360 tablet, Rfl: 2   glucose blood (FREESTYLE LITE) test strip, Use as instructed to monitor FSBS 1x daily. Dx: E11.9, Disp: 100 each, Rfl: 1   glucose monitoring kit (FREESTYLE) monitoring kit, 1 each by Does not apply route as needed for other. Dispense one glucometer of choice, testing strips/lancets for once per day glucose checks.  QS for 1 month, 11 refills., Disp: 1 each, Rfl: 0   Lancets (FREESTYLE) lancets, Use as instructed, Disp: 100 each, Rfl: 12   lisinopril (ZESTRIL) 10 MG tablet, Take 10 mg by mouth every evening., Disp: , Rfl:    metoprolol succinate (TOPROL-XL) 50 MG 24 hr tablet, Take 1.5 tablets (75 mg  total) by mouth daily., Disp: 135 tablet, Rfl: 3   mometasone (NASONEX) 50 MCG/ACT nasal spray, Place 2 sprays into the nose daily as needed (allergies)., Disp: , Rfl:    Semaglutide, 2 MG/DOSE, (OZEMPIC, 2 MG/DOSE,) 8 MG/3ML SOPN, INJECT 2 MG INTO THE SKIN ONCE A WEEK, Disp: 9 mL, Rfl: 1   Capsaicin 0.025 % LOTN, Apply 1 Application topically 4 (four) times daily as needed. Apply to the affected area 4 times daily as needed for itching. (Patient not taking: Reported on 06/16/2023), Disp: 113.2 mL, Rfl: 0   clobetasol (TEMOVATE) 0.05 % external solution, Apply 1 application topically 2 (two) times daily. (Patient not taking: Reported on 06/16/2023), Disp: 50 mL, Rfl: 2      Objective:    There were no vitals taken for this visit.  Wt Readings from Last 3 Encounters:  03/12/23 181 lb (82.1 kg)  02/14/23 182 lb (82.6 kg)  02/10/23 180 lb (81.6 kg)    Physical Exam Constitutional:      General: She is not in acute distress.    Appearance: Normal appearance. She is normal weight. She is not ill-appearing, toxic-appearing or diaphoretic.  HENT:     Head: Normocephalic.     Right Ear: Hearing, tympanic membrane, ear canal and external ear normal.     Left Ear: Hearing, tympanic membrane, ear canal and external ear normal.     Nose:     Right Sinus: Maxillary sinus tenderness and frontal sinus tenderness present.     Left Sinus: Maxillary sinus tenderness and frontal sinus tenderness present.     Mouth/Throat:     Pharynx: Posterior oropharyngeal erythema present.  Cardiovascular:     Rate and Rhythm: Normal rate and regular rhythm.  Pulmonary:     Effort: Pulmonary effort is normal.     Breath sounds: Normal breath sounds.  Musculoskeletal:        General: Normal range of motion.     Left lower leg: 1+ Edema present.  Neurological:     General: No focal deficit present.     Mental Status: She is alert and oriented to person, place, and time. Mental status is at baseline.   Psychiatric:        Mood and Affect: Mood normal.        Behavior: Behavior normal.        Thought Content: Thought content normal.        Judgment: Judgment normal.     Diabetic Foot Form - Detailed   Diabetic Foot Exam - detailed Can the patient see the bottom of their feet?: No Are the shoes appropriate in style and fit?: Yes Is there swelling or and abnormal foot shape?: No Is there a claw toe deformity?: No Is there elevated  skin temparature?: No Is there foot or ankle muscle weakness?: No Normal Range of Motion: Yes Right posterior Tibialias: Present Left posterior Tibialias: Present   Right Dorsalis Pedis: Present Left Dorsalis Pedis: Present  Semmes-Weinstein Monofilament Test R Site 1-Great Toe: Neg L Site 1-Great Toe: Pos               Assessment & Plan:  Type 2 diabetes mellitus with neurological complications (HCC) Assessment & Plan: A1c today in office stable.  Work on diabetic diet and exercise as tolerated. Yearly foot exam, and annual eye exam.    Orders: -     POCT glycosylated hemoglobin (Hb A1C)  Acute non-recurrent frontal sinusitis Assessment & Plan: RX doxycycline 100 mg po bid x 10 days Prescription given for augmentin 875/125 mg po bid for ten days. Pt to continue tylenol prn sinus pain. Continue with humidifier prn and steam showers recommended as well. instructed If no symptom improvement in 48 hours please f/u   Orders: -     Doxycycline Hyclate; Take 1 tablet (100 mg total) by mouth 2 (two) times daily for 10 days.  Dispense: 20 tablet; Refill: 0     Return in about 6 months (around 12/17/2023) for f/u diabetes, f/u cholesterol.  Mort Sawyers, MSN, APRN, FNP-C [facility]

## 2023-06-16 NOTE — Assessment & Plan Note (Signed)
RX doxycycline 100 mg po bid x 10 days Prescription given for augmentin 875/125 mg po bid for ten days. Pt to continue tylenol prn sinus pain. Continue with humidifier prn and steam showers recommended as well. instructed If no symptom improvement in 48 hours please f/u

## 2023-06-30 DIAGNOSIS — H16223 Keratoconjunctivitis sicca, not specified as Sjogren's, bilateral: Secondary | ICD-10-CM | POA: Diagnosis not present

## 2023-07-04 ENCOUNTER — Telehealth: Payer: Self-pay | Admitting: Pharmacy Technician

## 2023-07-04 ENCOUNTER — Other Ambulatory Visit (HOSPITAL_COMMUNITY): Payer: Self-pay

## 2023-07-04 NOTE — Telephone Encounter (Signed)
Pharmacy Patient Advocate Encounter   Received notification from CoverMyMeds that prior authorization for Ozempic is required/requested.   Insurance verification completed.   The patient is insured through  [employer]  .   Per test claim: PA required; PA submitted to [employer] via CoverMyMeds Key/confirmation #/EOC [id number] Status is pending

## 2023-07-07 ENCOUNTER — Other Ambulatory Visit (HOSPITAL_COMMUNITY): Payer: Self-pay

## 2023-07-08 ENCOUNTER — Other Ambulatory Visit (HOSPITAL_COMMUNITY): Payer: Self-pay

## 2023-07-08 NOTE — Telephone Encounter (Signed)
Pharmacy Patient Advocate Encounter  Received notification from [facility] that Prior Authorization for ozempic has been APPROVED from 07/04/23 to 07/03/26   PA #/Case ID/Reference #: [id number]

## 2023-07-14 ENCOUNTER — Other Ambulatory Visit: Payer: Self-pay | Admitting: Family

## 2023-07-14 DIAGNOSIS — G620 Drug-induced polyneuropathy: Secondary | ICD-10-CM

## 2023-07-14 DIAGNOSIS — Z789 Other specified health status: Secondary | ICD-10-CM

## 2023-07-14 DIAGNOSIS — E782 Mixed hyperlipidemia: Secondary | ICD-10-CM

## 2023-07-14 MED ORDER — REPATHA SURECLICK 140 MG/ML ~~LOC~~ SOAJ
SUBCUTANEOUS | 5 refills | Status: DC
Start: 2023-07-14 — End: 2024-01-16

## 2023-07-16 ENCOUNTER — Other Ambulatory Visit: Payer: Self-pay | Admitting: Family

## 2023-07-16 ENCOUNTER — Ambulatory Visit (HOSPITAL_COMMUNITY)
Admission: RE | Admit: 2023-07-16 | Discharge: 2023-07-16 | Disposition: A | Discharge status: Home / Self Care | Payer: Medicare Other | Source: Ambulatory Visit | Attending: Family | Admitting: Family

## 2023-07-16 ENCOUNTER — Other Ambulatory Visit: Payer: Self-pay | Admitting: *Deleted

## 2023-07-16 ENCOUNTER — Encounter: Payer: Self-pay | Admitting: Family

## 2023-07-16 ENCOUNTER — Ambulatory Visit (INDEPENDENT_AMBULATORY_CARE_PROVIDER_SITE_OTHER): Payer: Medicare Other | Admitting: Family

## 2023-07-16 VITALS — BP 132/70 | HR 67 | Temp 97.7°F | Ht 62.0 in | Wt 187.0 lb

## 2023-07-16 DIAGNOSIS — J42 Unspecified chronic bronchitis: Secondary | ICD-10-CM

## 2023-07-16 DIAGNOSIS — R069 Unspecified abnormalities of breathing: Secondary | ICD-10-CM | POA: Diagnosis not present

## 2023-07-16 DIAGNOSIS — J0141 Acute recurrent pansinusitis: Secondary | ICD-10-CM | POA: Diagnosis not present

## 2023-07-16 DIAGNOSIS — R0689 Other abnormalities of breathing: Secondary | ICD-10-CM | POA: Insufficient documentation

## 2023-07-16 DIAGNOSIS — R0602 Shortness of breath: Secondary | ICD-10-CM | POA: Diagnosis not present

## 2023-07-16 DIAGNOSIS — R0989 Other specified symptoms and signs involving the circulatory and respiratory systems: Secondary | ICD-10-CM | POA: Diagnosis not present

## 2023-07-16 MED ORDER — FUROSEMIDE 40 MG PO TABS: 40.0000 mg | ORAL_TABLET | Freq: Every day | ORAL | 3 refills | Status: DC

## 2023-07-16 MED ORDER — AMOXICILLIN-POT CLAVULANATE 875-125 MG PO TABS
1.0000 | ORAL_TABLET | Freq: Two times a day (BID) | ORAL | 0 refills | Status: DC
Start: 2023-07-16 — End: 2023-07-23

## 2023-07-16 MED ORDER — ALBUTEROL SULFATE HFA 108 (90 BASE) MCG/ACT IN AERS
2.0000 | INHALATION_SPRAY | RESPIRATORY_TRACT | 1 refills | Status: AC | PRN
Start: 2023-07-16 — End: ?

## 2023-07-16 MED ORDER — PREDNISONE 20 MG PO TABS
ORAL_TABLET | ORAL | 0 refills | Status: DC
Start: 2023-07-16 — End: 2023-07-23

## 2023-07-16 NOTE — Assessment & Plan Note (Addendum)
Suspected reccurance however pending chest xray to r/o pneumonia due to abn breath sounds on todays exam.  Consider ENT referral due to recurrence

## 2023-07-16 NOTE — Progress Notes (Signed)
Established Patient Office Visit  Subjective:      CC:  Chief Complaint  Patient presents with   Diabetes    HPI: Abigail [name] is a 72 y.o. female presenting on 07/16/2023 for Diabetes . Pt states completed doxycycline course x 10 days from 8/12 and felt much better afterwards but once the medication was completed the symptoms seemed to come right back (started again about two weeks ago) not improving not changing. No fever. She does have chills and hot spells. Bil ear pain and fullness started with a sore throat not as much since. Coughing up more mucous as well. With some worsening sob and chest congestion. She used albuterol maybe once or twice , she keeps forgetting to use it.   She has nasal congestion, sinus pressure,  She had some amoxicillin for dental prophylaxis without much relief.       Social history:  Relevant past medical, surgical, family and social history reviewed and updated as indicated. Interim medical history since our last visit reviewed.  Allergies and medications reviewed and updated.  DATA REVIEWED: CHART IN EPIC     ROS: Negative unless specifically indicated above in HPI.    Current Outpatient Medications:    predniSONE (DELTASONE) 20 MG tablet, Take two tablets once daily for 7 days, Disp: 14 tablet, Rfl: 0   albuterol (VENTOLIN HFA) 108 (90 Base) MCG/ACT inhaler, Inhale 2 puffs into the lungs every 4 (four) hours as needed for wheezing or shortness of breath., Disp: 1 each, Rfl: 1   amoxicillin (AMOXIL) 500 MG tablet, Take 4 tablets (2,000 mg total) by mouth as directed. 1 HOUR PRIOR TO DENTAL APPOINTMENTS, Disp: 12 tablet, Rfl: 6   aspirin 81 MG chewable tablet, Chew 1 tablet (81 mg total) by mouth daily., Disp: , Rfl:    Azelastine HCl 137 MCG/SPRAY SOLN, Place 1 spray into both nostrils daily as needed (allergies/sinus issues.)., Disp: , Rfl:    COSENTYX SENSOREADY, 300 MG, 150 MG/ML SOAJ, Inject 300 mg into the skin every 30 (thirty)  days., Disp: , Rfl:    diclofenac (FLECTOR) 1.3 % PTCH, Place 1 patch onto the skin 2 (two) times daily. (Patient taking differently: Place 1 patch onto the skin at bedtime as needed (neuropathy pain.).), Disp: 60 patch, Rfl: 6   diltiazem (CARDIZEM CD) 240 MG 24 hr capsule, Take 1 capsule (240 mg total) by mouth daily., Disp: 90 capsule, Rfl: 3   DULoxetine (CYMBALTA) 30 MG capsule, Take 1 capsule (30 mg total) by mouth at bedtime., Disp: 30 capsule, Rfl: 3   esomeprazole (NEXIUM) 20 MG capsule, Take 20 mg by mouth in the morning and at bedtime., Disp: , Rfl:    Evolocumab (REPATHA SURECLICK) 140 MG/ML SOAJ, Inject 140 mg [place] every two weeks, Disp: 2 mL, Rfl: 5   furosemide (LASIX) 20 MG tablet, Take 1 tablet (20 mg total) by mouth daily as needed., Disp: 30 tablet, Rfl: 3   gabapentin (NEURONTIN) 600 MG tablet, TAKE 2 TABLETS BY MOUTH IN THE MORNING AND AT NIGHT, Disp: 360 tablet, Rfl: 2   glucose blood (FREESTYLE LITE) test strip, Use as instructed to monitor FSBS 1x daily. Dx: E11.9, Disp: 100 each, Rfl: 1   glucose monitoring kit (FREESTYLE) monitoring kit, 1 each by Does not apply route as needed for other. Dispense one glucometer of choice, testing strips/lancets for once per day glucose checks.  QS for 1 month, 11 refills., Disp: 1 each, Rfl: 0   Lancets (FREESTYLE) lancets,  Use as instructed, Disp: 100 each, Rfl: 12   lisinopril (ZESTRIL) 10 MG tablet, Take 10 mg by mouth every evening., Disp: , Rfl:    metoprolol succinate (TOPROL-XL) 50 MG 24 hr tablet, Take 1.5 tablets (75 mg total) by mouth daily., Disp: 135 tablet, Rfl: 3   mometasone (NASONEX) 50 MCG/ACT nasal spray, Place 2 sprays into the nose daily as needed (allergies)., Disp: , Rfl:    Semaglutide, 2 MG/DOSE, (OZEMPIC, 2 MG/DOSE,) 8 MG/3ML SOPN, INJECT 2 MG INTO THE SKIN ONCE A WEEK, Disp: 9 mL, Rfl: 1      Objective:    BP 132/70 (BP Location: Left Arm, Patient Position: Sitting, Cuff Size: Normal)   Pulse 67   Temp 97.7 F  (36.5 C) (Temporal)   Ht 5\' 2"  (1.575 m)   Wt 187 lb (84.8 kg)   SpO2 97%   BMI 34.20 kg/m   Wt Readings from Last 3 Encounters:  07/16/23 187 lb (84.8 kg)  06/16/23 186 lb (84.4 kg)  03/12/23 181 lb (82.1 kg)    Physical Exam Constitutional:      General: She is not in acute distress.    Appearance: Normal appearance. She is normal weight. She is not ill-appearing, toxic-appearing or diaphoretic.  HENT:     Head: Normocephalic.     Right Ear: Tympanic membrane is erythematous and bulging.     Left Ear: Tympanic membrane normal.     Nose: Nose normal.     Mouth/Throat:     Mouth: Mucous membranes are dry.     Pharynx: No oropharyngeal exudate or posterior oropharyngeal erythema.  Eyes:     Extraocular Movements: Extraocular movements intact.     Pupils: Pupils are equal, round, and reactive to light.  Cardiovascular:     Rate and Rhythm: Normal rate and regular rhythm.     Pulses: Normal pulses.     Heart sounds: Normal heart sounds.  Pulmonary:     Effort: Pulmonary effort is normal.     Breath sounds: Examination of the right-lower field reveals rales. Examination of the left-lower field reveals rales. Rales present.  Musculoskeletal:     Cervical back: Normal range of motion.  Neurological:     General: No focal deficit present.     Mental Status: She is alert and oriented to person, place, and time. Mental status is at baseline.  Psychiatric:        Mood and Affect: Mood normal.        Behavior: Behavior normal.        Thought Content: Thought content normal.        Judgment: Judgment normal.           Assessment & Plan:  Shortness of breath -     predniSONE; Take two tablets once daily for 7 days  Dispense: 14 tablet; Refill: 0  Chronic bronchitis, unspecified chronic bronchitis type (HCC) -     Albuterol Sulfate HFA; Inhale 2 puffs into the lungs every 4 (four) hours as needed for wheezing or shortness of breath.  Dispense: 1 each; Refill: 1  Abnormal  breath sounds Assessment & Plan: Refill albuterol as current RX expired, use prn.  Stat CXR ordered pending results.  Ddx pneumonia vs pansinusitis Pending antbx choice due to results of CXR   Orders: -     DG Chest 2 View; Future  Recurrent pansinusitis Assessment & Plan: Suspected reccurance however pending chest xray to r/o pneumonia due to abn breath sounds on  todays exam.  Consider ENT referral due to recurrence        Return in about 6 months (around 01/13/2024) for as scheduled for f/u.  Mort Sawyers, MSN, APRN, FNP-C [facility]

## 2023-07-16 NOTE — Addendum Note (Signed)
Addended by: Jaynee Eagles C on: 07/16/2023 08:11 AM   Modules accepted: Orders

## 2023-07-16 NOTE — Assessment & Plan Note (Signed)
Refill albuterol as current RX expired, use prn.  Stat CXR ordered pending results.  Ddx pneumonia vs pansinusitis Pending antbx choice due to results of CXR

## 2023-07-21 ENCOUNTER — Ambulatory Visit (INDEPENDENT_AMBULATORY_CARE_PROVIDER_SITE_OTHER): Payer: Medicare Other | Admitting: Family

## 2023-07-21 ENCOUNTER — Encounter: Payer: Self-pay | Admitting: Family

## 2023-07-21 ENCOUNTER — Other Ambulatory Visit: Payer: Self-pay | Admitting: Family

## 2023-07-21 ENCOUNTER — Telehealth: Payer: Self-pay | Admitting: Cardiology

## 2023-07-21 VITALS — BP 140/70 | HR 79 | Temp 98.1°F | Ht 62.0 in | Wt 187.2 lb

## 2023-07-21 DIAGNOSIS — R200 Anesthesia of skin: Secondary | ICD-10-CM

## 2023-07-21 DIAGNOSIS — E1149 Type 2 diabetes mellitus with other diabetic neurological complication: Secondary | ICD-10-CM

## 2023-07-21 DIAGNOSIS — R0609 Other forms of dyspnea: Secondary | ICD-10-CM | POA: Diagnosis not present

## 2023-07-21 DIAGNOSIS — R202 Paresthesia of skin: Secondary | ICD-10-CM

## 2023-07-21 DIAGNOSIS — T502X5A Adverse effect of carbonic-anhydrase inhibitors, benzothiadiazides and other diuretics, initial encounter: Secondary | ICD-10-CM | POA: Diagnosis not present

## 2023-07-21 DIAGNOSIS — R0989 Other specified symptoms and signs involving the circulatory and respiratory systems: Secondary | ICD-10-CM | POA: Diagnosis not present

## 2023-07-21 LAB — COMPREHENSIVE METABOLIC PANEL
ALT: 17 U/L (ref 0–35)
AST: 13 U/L (ref 0–37)
Albumin: 3.8 g/dL (ref 3.5–5.2)
Alkaline Phosphatase: 68 U/L (ref 39–117)
BUN: 19 mg/dL (ref 6–23)
CO2: 30 meq/L (ref 19–32)
Calcium: 9.3 mg/dL (ref 8.4–10.5)
Chloride: 98 meq/L (ref 96–112)
Creatinine, Ser: 0.78 mg/dL (ref 0.40–1.20)
GFR: 74.99 mL/min (ref >60.00)
Glucose, Bld: 198 mg/dL — ABNORMAL HIGH (ref 70–99)
Potassium: 3.8 meq/L (ref 3.5–5.1)
Sodium: 138 meq/L (ref 135–145)
Total Bilirubin: 0.6 mg/dL (ref 0.2–1.2)
Total Protein: 6 g/dL (ref 6.0–8.3)

## 2023-07-21 LAB — BASIC METABOLIC PANEL
BUN: 19 mg/dL (ref 6–23)
CO2: 30 meq/L (ref 19–32)
Calcium: 9.3 mg/dL (ref 8.4–10.5)
Chloride: 98 meq/L (ref 96–112)
Creatinine, Ser: 0.78 mg/dL (ref 0.40–1.20)
GFR: 74.99 mL/min (ref >60.00)
Glucose, Bld: 198 mg/dL — ABNORMAL HIGH (ref 70–99)
Potassium: 3.8 meq/L (ref 3.5–5.1)
Sodium: 138 meq/L (ref 135–145)

## 2023-07-21 LAB — BRAIN NATRIURETIC PEPTIDE: Pro B Natriuretic peptide (BNP): 74 pg/mL (ref 0.0–100.0)

## 2023-07-21 MED ORDER — FUROSEMIDE 20 MG PO TABS
20.0000 mg | ORAL_TABLET | Freq: Every day | ORAL | 3 refills | Status: DC
Start: 2023-07-21 — End: 2023-12-09

## 2023-07-21 NOTE — Patient Instructions (Signed)
Decrease furosemide to 20 mg once daily from 40 mg once daily.   Stop by the lab prior to leaving today. I will notify you of your results once received.   Please call cardiology to see if you can get in for a follow up for worsening shortness of breath.   [name] to schedule ultrasound of the carotid arteries.

## 2023-07-21 NOTE — Progress Notes (Signed)
Established Patient Office Visit  Subjective:      CC: No chief complaint on file.   HPI: Abigail [name] is a 72 y.o. female presenting on 07/21/2023 for No chief complaint on file. . Seen last week 9/11 dx with pansinusitis  CXR was found to have pulmonary vascular congestion with some edema, was started on furosemide 40 mg once daily.  Albuterol prn  Started on augmentin bid x 10 days  Currently on day 5 of antibiotic   She states feeling a bit better but still with some tightness in her chest. She states she gets sob when walking, and at times she will also notice tingling in her bil arms.  Echocardiogram 03/2023 was without HF, with good EF 70-75% severe LVH   CTA 01/13/23, thickening calcification of aortic valve, aortic stenosis, mild aortoiliac atherosclerosis  Wt Readings from Last 3 Encounters:  07/21/23 187 lb 3.2 oz (84.9 kg)  07/16/23 187 lb (84.8 kg)  06/16/23 186 lb (84.4 kg)   Caridology, sees Dr. Jenene Slicker. Last seen 03/12/23.  She is taking her diltizem as prescribed, increased 240 mg once daily.      Social history:  Relevant past medical, surgical, family and social history reviewed and updated as indicated. Interim medical history since our last visit reviewed.  Allergies and medications reviewed and updated.  DATA REVIEWED: CHART IN EPIC     ROS: Negative unless specifically indicated above in HPI.    Current Outpatient Medications:    albuterol (VENTOLIN HFA) 108 (90 Base) MCG/ACT inhaler, Inhale 2 puffs into the lungs every 4 (four) hours as needed for wheezing or shortness of breath., Disp: 1 each, Rfl: 1   amoxicillin (AMOXIL) 500 MG tablet, Take 4 tablets (2,000 mg total) by mouth as directed. 1 HOUR PRIOR TO DENTAL APPOINTMENTS, Disp: 12 tablet, Rfl: 6   amoxicillin-clavulanate (AUGMENTIN) 875-125 MG tablet, Take 1 tablet by mouth 2 (two) times daily., Disp: 20 tablet, Rfl: 0   aspirin 81 MG chewable tablet, Chew 1 tablet (81 mg total) by  mouth daily., Disp: , Rfl:    Azelastine HCl 137 MCG/SPRAY SOLN, Place 1 spray into both nostrils daily as needed (allergies/sinus issues.)., Disp: , Rfl:    COSENTYX SENSOREADY, 300 MG, 150 MG/ML SOAJ, Inject 300 mg into the skin every 30 (thirty) days., Disp: , Rfl:    diclofenac (FLECTOR) 1.3 % PTCH, Place 1 patch onto the skin 2 (two) times daily. (Patient taking differently: Place 1 patch onto the skin at bedtime as needed (neuropathy pain.).), Disp: 60 patch, Rfl: 6   DULoxetine (CYMBALTA) 30 MG capsule, Take 1 capsule (30 mg total) by mouth at bedtime., Disp: 30 capsule, Rfl: 3   esomeprazole (NEXIUM) 20 MG capsule, Take 20 mg by mouth in the morning and at bedtime., Disp: , Rfl:    Evolocumab (REPATHA SURECLICK) 140 MG/ML SOAJ, Inject 140 mg [place] every two weeks, Disp: 2 mL, Rfl: 5   furosemide (LASIX) 20 MG tablet, Take 1 tablet (20 mg total) by mouth daily., Disp: 30 tablet, Rfl: 3   gabapentin (NEURONTIN) 600 MG tablet, TAKE 2 TABLETS BY MOUTH IN THE MORNING AND AT NIGHT, Disp: 360 tablet, Rfl: 2   glucose blood (FREESTYLE LITE) test strip, Use as instructed to monitor FSBS 1x daily. Dx: E11.9, Disp: 100 each, Rfl: 1   glucose monitoring kit (FREESTYLE) monitoring kit, 1 each by Does not apply route as needed for other. Dispense one glucometer of choice, testing strips/lancets for once per  day glucose checks.  QS for 1 month, 11 refills., Disp: 1 each, Rfl: 0   Lancets (FREESTYLE) lancets, Use as instructed, Disp: 100 each, Rfl: 12   lisinopril (ZESTRIL) 10 MG tablet, Take 10 mg by mouth every evening., Disp: , Rfl:    metoprolol succinate (TOPROL-XL) 50 MG 24 hr tablet, Take 1.5 tablets (75 mg total) by mouth daily., Disp: 135 tablet, Rfl: 3   mometasone (NASONEX) 50 MCG/ACT nasal spray, Place 2 sprays into the nose daily as needed (allergies)., Disp: , Rfl:    predniSONE (DELTASONE) 20 MG tablet, Take two tablets once daily for 7 days, Disp: 14 tablet, Rfl: 0   Semaglutide, 2 MG/DOSE,  (OZEMPIC, 2 MG/DOSE,) 8 MG/3ML SOPN, INJECT 2 MG INTO THE SKIN ONCE A WEEK, Disp: 9 mL, Rfl: 1   diltiazem (CARDIZEM CD) 240 MG 24 hr capsule, Take 1 capsule (240 mg total) by mouth daily., Disp: 90 capsule, Rfl: 3      Objective:    BP (!) 140/70   Pulse 79   Temp 98.1 F (36.7 C) (Temporal)   Ht 5\' 2"  (1.575 m)   Wt 187 lb 3.2 oz (84.9 kg)   SpO2 98%   BMI 34.24 kg/m   Wt Readings from Last 3 Encounters:  07/21/23 187 lb 3.2 oz (84.9 kg)  07/16/23 187 lb (84.8 kg)  06/16/23 186 lb (84.4 kg)    Physical Exam Constitutional:      General: She is not in acute distress.    Appearance: Normal appearance. She is normal weight. She is not ill-appearing, toxic-appearing or diaphoretic.  HENT:     Head: Normocephalic.  Cardiovascular:     Rate and Rhythm: Normal rate and regular rhythm.     Comments: Left carotid bruit on auscultation  Pulmonary:     Effort: Pulmonary effort is normal.     Breath sounds: Examination of the right-lower field reveals rales. Examination of the left-lower field reveals rales. Rales present.  Musculoskeletal:        General: Normal range of motion.     Right lower leg: 1+ Edema present.     Left lower leg: 1+ Edema present.  Neurological:     General: No focal deficit present.     Mental Status: She is alert and oriented to person, place, and time. Mental status is at baseline.  Psychiatric:        Mood and Affect: Mood normal.        Behavior: Behavior normal.        Thought Content: Thought content normal.        Judgment: Judgment normal.           Assessment & Plan:  Adverse effect of potassium sparing diuretic -     Basic metabolic panel -     Brain natriuretic peptide  Pulmonary vascular congestion Assessment & Plan: Some improvement in symptoms over the weekend with addition of furosemide 40 mg once daily Weight stable today.  Reached out to cardiology office, they will get pt in this week 9/18 , pt aware of appointment time  and date.  Lungs with some improvement today on auscultation, still slight crackles bil bases.  Decrease, continue furosemide 20 mg once daily. Ordering bmp today to monitor potassium levels.  Advised pt to weight self daily report weight gain > 2 pounds in one day Report worsening sob and or doe.     Orders: -     Brain natriuretic peptide -  Furosemide; Take 1 tablet (20 mg total) by mouth daily.  Dispense: 30 tablet; Refill: 3 -     US Carotid Bilateral; Future -     Comprehensive metabolic panel  DOE (dyspnea on exertion) -     Brain natriuretic peptide -     Furosemide; Take 1 tablet (20 mg total) by mouth daily.  Dispense: 30 tablet; Refill: 3 -     US Carotid Bilateral; Future -     Comprehensive metabolic panel  Numbness and tingling of both upper extremities -     US Carotid Bilateral; Future  Left carotid bruit Assessment & Plan: R/o carotid occlusion  U/s carotid bil ordered pending results.  Curious if this could be what is causing bil arm neuralgia with chest tightness      Return in about 10 days (around 07/31/2023) for follow up shortness of breath .  Mort Sawyers, MSN, APRN, FNP-C [facility]

## 2023-07-21 NOTE — Progress Notes (Signed)
Can we add an A1C if so placed in future orders

## 2023-07-21 NOTE — Assessment & Plan Note (Signed)
R/o carotid occlusion  U/s carotid bil ordered pending results.  Curious if this could be what is causing bil arm neuralgia with chest tightness

## 2023-07-21 NOTE — Assessment & Plan Note (Signed)
Some improvement in symptoms over the weekend with addition of furosemide 40 mg once daily Weight stable today.  Reached out to cardiology office, they will get pt in this week 9/18 , pt aware of appointment time and date.  Lungs with some improvement today on auscultation, still slight crackles bil bases.  Decrease, continue furosemide 20 mg once daily. Ordering bmp today to monitor potassium levels.  Advised pt to weight self daily report weight gain > 2 pounds in one day Report worsening sob and or doe.

## 2023-07-21 NOTE — Progress Notes (Signed)
BNP is stable which shows not in a CHF flare which is good. We will await imaging results. Kidney function is stable however sugar is elevated. I will try to add on A1C to see where the average glucose level lies.

## 2023-07-22 NOTE — Telephone Encounter (Signed)
error 

## 2023-07-23 ENCOUNTER — Ambulatory Visit (INDEPENDENT_AMBULATORY_CARE_PROVIDER_SITE_OTHER): Payer: Medicare Other | Admitting: Physician Assistant

## 2023-07-23 ENCOUNTER — Ambulatory Visit: Payer: Medicare Other

## 2023-07-23 ENCOUNTER — Ambulatory Visit: Payer: Medicare Other | Admitting: Podiatry

## 2023-07-23 ENCOUNTER — Ambulatory Visit
Admission: RE | Admit: 2023-07-23 | Discharge: 2023-07-23 | Disposition: A | Discharge status: Home / Self Care | Payer: Medicare Other | Source: Ambulatory Visit | Attending: Family | Admitting: Family

## 2023-07-23 VITALS — BP 138/62 | HR 100 | Ht 62.0 in | Wt 185.6 lb

## 2023-07-23 DIAGNOSIS — I422 Other hypertrophic cardiomyopathy: Secondary | ICD-10-CM | POA: Diagnosis not present

## 2023-07-23 DIAGNOSIS — I3481 Nonrheumatic mitral (valve) annulus calcification: Secondary | ICD-10-CM | POA: Diagnosis not present

## 2023-07-23 DIAGNOSIS — R0989 Other specified symptoms and signs involving the circulatory and respiratory systems: Secondary | ICD-10-CM | POA: Diagnosis not present

## 2023-07-23 DIAGNOSIS — R200 Anesthesia of skin: Secondary | ICD-10-CM | POA: Insufficient documentation

## 2023-07-23 DIAGNOSIS — Z952 Presence of prosthetic heart valve: Secondary | ICD-10-CM

## 2023-07-23 DIAGNOSIS — I5033 Acute on chronic diastolic (congestive) heart failure: Secondary | ICD-10-CM | POA: Diagnosis not present

## 2023-07-23 DIAGNOSIS — R0609 Other forms of dyspnea: Secondary | ICD-10-CM | POA: Diagnosis not present

## 2023-07-23 DIAGNOSIS — R202 Paresthesia of skin: Secondary | ICD-10-CM | POA: Insufficient documentation

## 2023-07-23 NOTE — Progress Notes (Signed)
[facility]   MULTIDISCIPLINARY HEART VALVE CLINIC                                     Cardiology Office Note:    Date:  07/23/2023   ID:  ANNASTON GALLEGO, DOB 12-01-1950, MRN [id number]  PCP:  Mort Sawyers, FNP  [facility] Cardiologist:  Marjo Bicker, MD  [id number] Electrophysiologist:  None   Referring MD: Mort Sawyers, FNP   Add on for shortness of breath  History of Present Illness:    Abigail [name] is a 72 y.o. female with a hx of HTN, HLD, DM2, COPD, HOCM, and severe aortic stenosis s/p TAVR (12/07/22) who presents for evaluation of SOB.  Abigail [name] was referred to Dr. Jenene Slicker by PCP for worsening SOB and DOE and abnormal echo. Echocardiogram 12/09/22 showed LVEF 65 to 70%, severe asymmetric basal septal hypertrophy measuring 1.9 cm, mild SAM of the anterior MV leaflet, dynamic subvalvular gradient, peak gradient at least 25 mmHg, G1 DD, normal RV systolic function, severely dilated LA, mild mitral valve stenosis, severe stenosis of aortic valve with mean gradient 51 mmHg, peak gradient 88.6 mmHg, valve area 0.73 cm (aortic valve area by VTI was 1.04 cm and mean gradient 24 mmHg on 04/2021 study). R/LHC showed nonobstructive CAD with no flow-limiting coronary lesions.   Evaluated by valve team and now s/p TAVR with a 23 mm Edwards Sapien 3 THV via the TF approach on 02/05/23. Post operative echo showed a mean gradient at and peak at 26.23mmHg. Her higher gradients were felt to be secondary to HOCM and metoprolol was increased. She was continued on ASA monotherapy. She continued to have some dyspnea and Diltiazem was added. Given elevated RAISE score, she underwent PYP scanning/labs which were negative for amyloid.   Seen by PCP recently and diagnosed with sinusitis treated with Abx and prednisone. CXR showed pulmonary vascular congestion and started on lasix 40mg  daily, which was later decreased to 20 mg daily. Follow up labs showed normal BNP, creat  0.78, K 3.8.  Today the patient presents to clinic for follow up. Here alone. Has had chronic shortness of breath that has not really changed since TAVR. May have improved a little since addition of lasix. Denies LE edema, unexplained weight gain, abdominal distension or orthopnea. No chest pain. No syncope.  Past Medical History:  Diagnosis Date   Allergy    Anxiety    Arthritis    hands, knees   Cancer (HCC)    Cataract    surgery to remove -bilateral   COPD (chronic obstructive pulmonary disease) (HCC)    Depression    Diabetes mellitus (HCC)    a. A1c 6.6 in 05/2013 indicating new dx.   Fatty liver    Female stress incontinence    GERD (gastroesophageal reflux disease)    Heart murmur    no problems   Hemorrhoids    History of kidney stones    surgery to remove   Hyperlipidemia    Hyperplastic colon polyp 06/07/2008   Hypertension    Hypertriglyceridemia    IBS (irritable bowel syndrome)    Lung nodule    a. 4mm left lung nodule by CT 05/2013.   Neuromuscular disorder (HCC)    neuropathy feet   Obesity    Psoriasis    S/P TAVR (transcatheter aortic valve replacement) 02/05/2023   23mm S3UR  via TF approach with Dr. Excell Seltzer and Dr. Leafy Ro   SCCA (squamous cell carcinoma) of skin 01/16/2022   Left Malar Cheek (in situ) (tx p bx)   Skin cancer    Squamous cell carcinoma of skin 01/16/2022   Right Breast (in situ) (curet and 5FU)    Past Surgical History:  Procedure Laterality Date   CATARACT EXTRACTION W/PHACO Left 10/06/2014   Procedure: CATARACT EXTRACTION PHACO AND INTRAOCULAR LENS PLACEMENT LEFT EYE;  Surgeon: Gemma Payor, MD;  Location: [facility];  Service: Ophthalmology;  Laterality: Left;  CDE 7.35   CATARACT EXTRACTION W/PHACO Right 11/14/2014   Procedure: CATARACT EXTRACTION PHACO AND INTRAOCULAR LENS PLACEMENT RIGHT EYE;  Surgeon: Gemma Payor, MD;  Location: [facility];  Service: Ophthalmology;  Laterality: Right;  CDE:6.39   COLONOSCOPY  06/2008   hx polyps    CYSTOSCOPY W/ RETROGRADES Right 01/21/2017   Procedure: CYSTOSCOPY WITH RETROGRADE PYELOGRAM;  Surgeon: Vanna Scotland, MD;  Location: [facility];  Service: Urology;  Laterality: Right;   CYSTOSCOPY/URETEROSCOPY/HOLMIUM LASER/STENT PLACEMENT Left 01/21/2017   Procedure: CYSTOSCOPY/URETEROSCOPY/HOLMIUM LASER/STENT PLACEMENT;  Surgeon: Vanna Scotland, MD;  Location: [facility];  Service: Urology;  Laterality: Left;   DORSAL COMPARTMENT RELEASE Left 06/15/2015   Procedure: LEFT WRIST DEQUERVAINS;  Surgeon: Loreta Ave, MD;  Location: [facility];  Service: Orthopedics;  Laterality: Left;   HERNIA REPAIR     INTRAOPERATIVE TRANSTHORACIC ECHOCARDIOGRAM N/A 02/05/2023   Procedure: INTRAOPERATIVE TRANSTHORACIC ECHOCARDIOGRAM;  Surgeon: Tonny Bollman, MD;  Location: [facility] INVASIVE CV LAB;  Service: Open Heart Surgery;  Laterality: N/A;   MOUTH SURGERY     tooth ext   NASAL SINUS SURGERY     RIGHT/LEFT HEART CATH AND CORONARY ANGIOGRAPHY N/A 01/08/2023   Procedure: RIGHT/LEFT HEART CATH AND CORONARY ANGIOGRAPHY;  Surgeon: Tonny Bollman, MD;  Location: [facility] INVASIVE CV LAB;  Service: Cardiovascular;  Laterality: N/A;   SHOULDER SURGERY     right   TONSILLECTOMY     TRANSCATHETER AORTIC VALVE REPLACEMENT, TRANSFEMORAL Right 02/05/2023   Procedure: Transcatheter Aortic Valve Replacement, Transfemoral;  Surgeon: Tonny Bollman, MD;  Location: [facility] INVASIVE CV LAB;  Service: Open Heart Surgery;  Laterality: Right;   UPPER GI ENDOSCOPY     normal per patient   VENTRAL HERNIA REPAIR      Current Medications: Current Meds  Medication Sig   albuterol (VENTOLIN HFA) 108 (90 Base) MCG/ACT inhaler Inhale 2 puffs into the lungs every 4 (four) hours as needed for wheezing or shortness of breath.   amoxicillin (AMOXIL) 500 MG tablet Take 4 tablets (2,000 mg total) by mouth as directed. 1 HOUR PRIOR TO DENTAL APPOINTMENTS   aspirin 81 MG chewable tablet Chew 1 tablet (81 mg total) by mouth daily.    Azelastine HCl 137 MCG/SPRAY SOLN Place 1 spray into both nostrils daily as needed (allergies/sinus issues.).   COSENTYX SENSOREADY, 300 MG, 150 MG/ML SOAJ Inject 300 mg into the skin every 30 (thirty) days.   diltiazem (CARDIZEM CD) 240 MG 24 hr capsule Take 1 capsule (240 mg total) by mouth daily.   DULoxetine (CYMBALTA) 30 MG capsule Take 1 capsule (30 mg total) by mouth at bedtime.   esomeprazole (NEXIUM) 20 MG capsule Take 20 mg by mouth in the morning and at bedtime.   Evolocumab (REPATHA SURECLICK) 140 MG/ML SOAJ Inject 140 mg [place] every two weeks   furosemide (LASIX) 20 MG tablet Take 1 tablet (20 mg total) by mouth daily.   gabapentin (NEURONTIN) 600 MG tablet TAKE 2  TABLETS BY MOUTH IN THE MORNING AND AT NIGHT   glucose blood (FREESTYLE LITE) test strip Use as instructed to monitor FSBS 1x daily. Dx: E11.9   glucose monitoring kit (FREESTYLE) monitoring kit 1 each by Does not apply route as needed for other. Dispense one glucometer of choice, testing strips/lancets for once per day glucose checks.  QS for 1 month, 11 refills.   Lancets (FREESTYLE) lancets Use as instructed   lisinopril (ZESTRIL) 10 MG tablet Take 10 mg by mouth every evening.   metoprolol succinate (TOPROL-XL) 50 MG 24 hr tablet Take 1.5 tablets (75 mg total) by mouth daily.   mometasone (NASONEX) 50 MCG/ACT nasal spray Place 2 sprays into the nose daily as needed (allergies).   Semaglutide, 2 MG/DOSE, (OZEMPIC, 2 MG/DOSE,) 8 MG/3ML SOPN INJECT 2 MG INTO THE SKIN ONCE A WEEK      ROS:   Please see the history of present illness.    All other systems reviewed and are negative.  EKGs   EKG:  EKG is NOT ordered today.   Recent Labs: 02/06/2023: Magnesium 1.7 03/17/2023: Hemoglobin 14.6; Platelets 176.0 07/21/2023: ALT 17; BUN 19; BUN 19; Creatinine, Ser 0.78; Creatinine, Ser 0.78; Potassium 3.8; Potassium 3.8; Pro B Natriuretic peptide (BNP) 74.0; Sodium 138; Sodium 138  Recent Lipid Panel    Component Value  Date/Time   CHOL 128 03/17/2023 0741   TRIG 204.0 (H) 03/17/2023 0741   HDL 44.50 03/17/2023 0741   CHOLHDL 3 03/17/2023 0741   VLDL 40.8 (H) 03/17/2023 0741   LDLCALC 168 (H) 07/02/2021 1224   LDLDIRECT 58.0 03/17/2023 0741     Risk Assessment/Calculations:            Physical Exam:    VS:  BP 138/62   Pulse 100   Ht 5\' 2"  (1.575 m)   Wt 185 lb 9.6 oz (84.2 kg)   SpO2 94%   BMI 33.95 kg/m     Wt Readings from Last 3 Encounters:  07/23/23 185 lb 9.6 oz (84.2 kg)  07/21/23 187 lb 3.2 oz (84.9 kg)  07/16/23 187 lb (84.8 kg)     GEN: Well nourished, well developed in no acute distress NECK: No JVD; No carotid bruits CARDIAC: RRR, 3/6 systolic murmur loudest at LUSB. No rubs, gallops RESPIRATORY:  Clear to auscultation without rales, wheezing or rhonchi  ABDOMEN: Soft, non-tender, non-distended EXTREMITIES:  No edema; No deformity   ASSESSMENT:    1. DOE (dyspnea on exertion)   2. Acute on chronic heart failure with preserved ejection fraction (HCC)   3. Hypertrophic cardiomyopathy (HCC)   4. S/P TAVR (transcatheter aortic valve replacement)   5. Mitral annular calcification    PLAN:    In order of problems listed above:  HFpEF: most recent echo with EF >75%. Recent CXR with pulm vascular congestion. Started on lasix 40mg  daily which was later dropped to 20mg  daily. Follow up labs showed normal BNP, creat 0.78, K 3.8. She does not look overloaded at this time. Will continue Lasix 20mg  daily. I don't think this totally explains her ongoing shortness of breath.   HOCM: reviewed echo with Dr. Lorna Few in clinic today. Most recent echo shows a mild resting LVOT gradient although not reported in report. She was supposed to get a cMRI but this was cancelled 2/2 TAVR. Will get this set up again. Per Dr. Marnee Guarneri, could consider stress echo, but at this point she would not be a candidate for mavacamten. Continue Cardizem CD 240mg  daily  and Toprol XL 75mg  daily.    Severe AS s/p TAVR: this was stable by most recent echo. Will repeat echo and see her back in 02/2024.   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  Medication Adjustments/Labs and Tests Ordered: Current medicines are reviewed at length with the patient today.  Concerns regarding medicines are outlined above.  No orders of the defined types were placed in this encounter.  No orders of the defined types were placed in this encounter.   Patient Instructions  Medication Instructions:  Your physician recommends that you continue on your current medications as directed. Please refer to the Current Medication list given to you today.  *If you need a refill on your cardiac medications before your next appointment, please call your pharmacy*   Lab Work: None ordered   If you have labs (blood work) drawn today and your tests are completely normal, you will receive your results only by: MyChart Message (if you have MyChart) OR A paper copy in the mail If you have any lab test that is abnormal or we need to change your treatment, we will call you to review the results.   Testing/Procedures: Your physician has requested that you schedule the MRI that was ordered back in February of this year    Follow-Up: Follow up as scheduled    Signed, Cline Crock, PA-C  07/23/2023 4:36 PM    [facility]

## 2023-07-23 NOTE — Patient Instructions (Signed)
Medication Instructions:  Your physician recommends that you continue on your current medications as directed. Please refer to the Current Medication list given to you today.  *If you need a refill on your cardiac medications before your next appointment, please call your pharmacy*   Lab Work: None ordered   If you have labs (blood work) drawn today and your tests are completely normal, you will receive your results only by: MyChart Message (if you have MyChart) OR A paper copy in the mail If you have any lab test that is abnormal or we need to change your treatment, we will call you to review the results.   Testing/Procedures: Your physician has requested that you schedule the MRI that was ordered back in February of this year    Follow-Up: Follow up as scheduled

## 2023-07-24 NOTE — Progress Notes (Signed)
Some plaquing in carotid arteries however no stenosis and or blockages.  Overall good reading.

## 2023-07-30 ENCOUNTER — Ambulatory Visit: Payer: Self-pay

## 2023-07-30 NOTE — Patient Outreach (Signed)
Care Coordination   07/30/2023 Name: VERGIA LEISING MRN: [id number] DOB: Nov 16, 1950   Care Coordination Outreach Attempts:  An unsuccessful telephone outreach was attempted for a scheduled appointment today. HPAA compliant message left with return call phone number.   Follow Up Plan:  Additional outreach attempts will be made to offer the patient care coordination information and services.   Encounter Outcome:  No Answer   Care Coordination Interventions:  No, not indicated    [name] [facility] Care Coordination [phone] direct line

## 2023-08-05 ENCOUNTER — Ambulatory Visit (INDEPENDENT_AMBULATORY_CARE_PROVIDER_SITE_OTHER): Payer: Medicare Other | Admitting: Family

## 2023-08-05 ENCOUNTER — Encounter: Payer: Self-pay | Admitting: Family

## 2023-08-05 ENCOUNTER — Telehealth: Payer: Self-pay | Admitting: *Deleted

## 2023-08-05 VITALS — BP 144/72 | HR 78 | Temp 97.8°F | Ht 62.0 in | Wt 188.0 lb

## 2023-08-05 DIAGNOSIS — R0609 Other forms of dyspnea: Secondary | ICD-10-CM

## 2023-08-05 DIAGNOSIS — K219 Gastro-esophageal reflux disease without esophagitis: Secondary | ICD-10-CM | POA: Diagnosis not present

## 2023-08-05 DIAGNOSIS — R058 Other specified cough: Secondary | ICD-10-CM | POA: Diagnosis not present

## 2023-08-05 DIAGNOSIS — J3489 Other specified disorders of nose and nasal sinuses: Secondary | ICD-10-CM | POA: Insufficient documentation

## 2023-08-05 LAB — POC COVID19 BINAXNOW: SARS Coronavirus 2 Ag: NEGATIVE

## 2023-08-05 MED ORDER — ESOMEPRAZOLE MAGNESIUM 40 MG PO CPDR
40.0000 mg | DELAYED_RELEASE_CAPSULE | Freq: Every day | ORAL | 3 refills | Status: DC
Start: 2023-08-05 — End: 2023-08-15

## 2023-08-05 NOTE — Assessment & Plan Note (Signed)
Stable not improving or worsening.  Trial increase Nexium to see if there is some improvement with suspected GERD.  Reviewed chart back to 2019 and reviewed pulmonary consult which suggested that patient had upper airway symptom syndrome.  Per note with cardiologist and most recent visit they stated that they did not feel it was likely due to anything cardiac in nature so we will pursue pulmonary evaluation.  Referral placed to pulmonologist for possible PFTs and evaluation for other ddx, copd vs asthma

## 2023-08-05 NOTE — Progress Notes (Signed)
Care Coordination Note  08/05/2023 Name: TANNA BARTEK MRN: [id number] DOB: [birth year]  Garry Heater Besch is a 72 y.o. year old female who is a primary care patient of Mort Sawyers, FNP and is actively engaged with the care management team. I reached out to [name] by phone today to assist with re-scheduling a follow up visit with the RN Case Manager  Follow up plan: Unsuccessful telephone outreach attempt made. A HIPAA compliant phone message was left for the patient providing contact information and requesting a return call.   Burman Nieves, CCMA Care Coordination Care Guide Direct Dial: [phone]

## 2023-08-05 NOTE — Assessment & Plan Note (Signed)
Trial of increasing esomeprazole to 40 mg once daily to see if shortness of breath and cough improved slightly.  Advised to work on reducing acidic foods as well to include caffeine chocolate fried fatty foods and/or spicy foods.

## 2023-08-05 NOTE — Assessment & Plan Note (Signed)
COVID tested in office and is negative.  Suspect viral infection.  Tylenol as needed for sinus pressure.  Continue with humidifier prn and steam showers recommended as well. instructed If no symptom improvement in 48 hours please f/u

## 2023-08-05 NOTE — Progress Notes (Signed)
Established Patient Office Visit  Subjective:      CC:  Chief Complaint  Patient presents with   Medical Management of Chronic Issues    HPI: Abigail [name] is a 72 y.o. female presenting on 08/05/2023 for Medical Management of Chronic Issues . Echo EF 75% > with pulmonary vascular congestion. Was on lasix 40 mg once daily later dropped to 20 mg once daily. Normal BNP. Advised to continue lasix 20 mg daily by cardiology. On cardizem 240 mg every day and toprol XL 75 mg every day  9/16 visit with me, had advised pt to decrease furosemide 20 mg once daily as had had some improvement 40 mg once daily.   Ongoing sob, has not completely improved significantly. When she deep breathe it feels like it is cold in her airway. It burns when she takes a deep breath, and states feels like cold air is getting in there.  No h/o smoking.  CT chest 02/04/2018 suggestive of bronchiectasis and 2 mm right middle lobe pulmonary nodule.  CT chest 01/13/23: small solid pulmonary nodules 3 mm RML and 3 mm LUL , no longer bronchiectasis noted.  Was seen in 2019 (02/11/2018) by Dr. Sherene Sires, pulmonary, for evaluation for ongoing cough. Dx with suspected GERD related upper airway cough syndrome, advised for GERD control and ACE was suspected as well so this was d/c. COPD has been on chart since 2018,PFT's 01/22/2018 FEV1 1.96 (92 % ) ratio 81 p 1 % improvement from saba p no rx prior to study with DLCO 98 % corrects to 107 % for alv volume , unsure if official COPD diagnosis made (noted in chart as dx in 2018 by old pcp)   New complaints: C/o sinus headache today, started yesterday. She is feeling achy a bit and run down. No fever or chills. No sore throat.      Social history:  Relevant past medical, surgical, family and social history reviewed and updated as indicated. Interim medical history since our last visit reviewed.  Allergies and medications reviewed and updated.  DATA REVIEWED: CHART IN  EPIC     ROS: Negative unless specifically indicated above in HPI.    Current Outpatient Medications:    albuterol (VENTOLIN HFA) 108 (90 Base) MCG/ACT inhaler, Inhale 2 puffs into the lungs every 4 (four) hours as needed for wheezing or shortness of breath., Disp: 1 each, Rfl: 1   amoxicillin (AMOXIL) 500 MG tablet, Take 4 tablets (2,000 mg total) by mouth as directed. 1 HOUR PRIOR TO DENTAL APPOINTMENTS, Disp: 12 tablet, Rfl: 6   aspirin 81 MG chewable tablet, Chew 1 tablet (81 mg total) by mouth daily., Disp: , Rfl:    Azelastine HCl 137 MCG/SPRAY SOLN, Place 1 spray into both nostrils daily as needed (allergies/sinus issues.)., Disp: , Rfl:    COSENTYX SENSOREADY, 300 MG, 150 MG/ML SOAJ, Inject 300 mg into the skin every 30 (thirty) days., Disp: , Rfl:    DULoxetine (CYMBALTA) 30 MG capsule, Take 1 capsule (30 mg total) by mouth at bedtime., Disp: 30 capsule, Rfl: 3   esomeprazole (NEXIUM) 40 MG capsule, Take 1 capsule (40 mg total) by mouth daily., Disp: 30 capsule, Rfl: 3   Evolocumab (REPATHA SURECLICK) 140 MG/ML SOAJ, Inject 140 mg [place] every two weeks, Disp: 2 mL, Rfl: 5   furosemide (LASIX) 20 MG tablet, Take 1 tablet (20 mg total) by mouth daily., Disp: 30 tablet, Rfl: 3   gabapentin (NEURONTIN) 600 MG tablet, TAKE 2 TABLETS BY  MOUTH IN THE MORNING AND AT NIGHT, Disp: 360 tablet, Rfl: 2   glucose blood (FREESTYLE LITE) test strip, Use as instructed to monitor FSBS 1x daily. Dx: E11.9, Disp: 100 each, Rfl: 1   glucose monitoring kit (FREESTYLE) monitoring kit, 1 each by Does not apply route as needed for other. Dispense one glucometer of choice, testing strips/lancets for once per day glucose checks.  QS for 1 month, 11 refills., Disp: 1 each, Rfl: 0   Lancets (FREESTYLE) lancets, Use as instructed, Disp: 100 each, Rfl: 12   lisinopril (ZESTRIL) 10 MG tablet, Take 10 mg by mouth every evening., Disp: , Rfl:    metoprolol succinate (TOPROL-XL) 50 MG 24 hr tablet, Take 1.5 tablets (75 mg  total) by mouth daily., Disp: 135 tablet, Rfl: 3   mometasone (NASONEX) 50 MCG/ACT nasal spray, Place 2 sprays into the nose daily as needed (allergies)., Disp: , Rfl:    Semaglutide, 2 MG/DOSE, (OZEMPIC, 2 MG/DOSE,) 8 MG/3ML SOPN, INJECT 2 MG INTO THE SKIN ONCE A WEEK, Disp: 9 mL, Rfl: 1   diltiazem (CARDIZEM CD) 240 MG 24 hr capsule, Take 1 capsule (240 mg total) by mouth daily., Disp: 90 capsule, Rfl: 3      Objective:    BP (!) 144/72 (BP Location: Left Arm, Patient Position: Sitting, Cuff Size: Normal)   Pulse 78   Temp 97.8 F (36.6 C) (Temporal)   Ht 5\' 2"  (1.575 m)   Wt 188 lb (85.3 kg)   SpO2 97%   BMI 34.39 kg/m   Wt Readings from Last 3 Encounters:  08/05/23 188 lb (85.3 kg)  07/23/23 185 lb 9.6 oz (84.2 kg)  07/21/23 187 lb 3.2 oz (84.9 kg)    Physical Exam Constitutional:      General: She is not in acute distress.    Appearance: Normal appearance. She is normal weight. She is not ill-appearing, toxic-appearing or diaphoretic.  HENT:     Head: Normocephalic.     Right Ear: Tympanic membrane normal.     Left Ear: Tympanic membrane normal.     Nose:     Right Sinus: Maxillary sinus tenderness and frontal sinus tenderness present.     Left Sinus: Maxillary sinus tenderness and frontal sinus tenderness present.     Mouth/Throat:     Mouth: Mucous membranes are dry.     Pharynx: No oropharyngeal exudate or posterior oropharyngeal erythema.  Eyes:     Extraocular Movements: Extraocular movements intact.     Pupils: Pupils are equal, round, and reactive to light.  Cardiovascular:     Rate and Rhythm: Normal rate and regular rhythm.     Pulses: Normal pulses.     Heart sounds: Normal heart sounds.  Pulmonary:     Effort: Pulmonary effort is normal.     Breath sounds: Normal breath sounds.  Musculoskeletal:     Cervical back: Normal range of motion.  Neurological:     General: No focal deficit present.     Mental Status: She is alert and oriented to person,  place, and time. Mental status is at baseline.  Psychiatric:        Mood and Affect: Mood normal.        Behavior: Behavior normal.        Thought Content: Thought content normal.        Judgment: Judgment normal.           Assessment & Plan:  Upper airway cough syndrome -  Ambulatory referral to Pulmonology -     Esomeprazole Magnesium; Take 1 capsule (40 mg total) by mouth daily.  Dispense: 30 capsule; Refill: 3 -     POC COVID-19 BinaxNow  Gastroesophageal reflux disease without esophagitis Assessment & Plan: Trial of increasing esomeprazole to 40 mg once daily to see if shortness of breath and cough improved slightly.  Advised to work on reducing acidic foods as well to include caffeine chocolate fried fatty foods and/or spicy foods.  Orders: -     Ambulatory referral to Pulmonology -     Esomeprazole Magnesium; Take 1 capsule (40 mg total) by mouth daily.  Dispense: 30 capsule; Refill: 3  DOE (dyspnea on exertion) Assessment & Plan: Stable not improving or worsening.  Trial increase Nexium to see if there is some improvement with suspected GERD.  Reviewed chart back to 2019 and reviewed pulmonary consult which suggested that patient had upper airway symptom syndrome.  Per note with cardiologist and most recent visit they stated that they did not feel it was likely due to anything cardiac in nature so we will pursue pulmonary evaluation.  Referral placed to pulmonologist for possible PFTs and evaluation for other ddx, copd vs asthma    Sinus pressure Assessment & Plan: COVID tested in office and is negative.  Suspect viral infection.  Tylenol as needed for sinus pressure.  Continue with humidifier prn and steam showers recommended as well. instructed If no symptom improvement in 48 hours please f/u       Return in about 3 months (around 11/05/2023) for follow up sob.  Mort Sawyers, MSN, APRN, FNP-C [facility]

## 2023-08-06 ENCOUNTER — Other Ambulatory Visit
Admission: RE | Admit: 2023-08-06 | Discharge: 2023-08-06 | Disposition: A | Discharge status: Home / Self Care | Payer: Medicare Other | Source: Ambulatory Visit | Attending: Student in an Organized Health Care Education/Training Program | Admitting: Student in an Organized Health Care Education/Training Program

## 2023-08-06 ENCOUNTER — Encounter: Payer: Self-pay | Admitting: Student in an Organized Health Care Education/Training Program

## 2023-08-06 ENCOUNTER — Ambulatory Visit
Admission: RE | Admit: 2023-08-06 | Discharge: 2023-08-06 | Disposition: A | Discharge status: Home / Self Care | Payer: Medicare Other | Source: Ambulatory Visit | Attending: Student in an Organized Health Care Education/Training Program | Admitting: Student in an Organized Health Care Education/Training Program

## 2023-08-06 ENCOUNTER — Ambulatory Visit (INDEPENDENT_AMBULATORY_CARE_PROVIDER_SITE_OTHER): Payer: Medicare Other | Admitting: Student in an Organized Health Care Education/Training Program

## 2023-08-06 VITALS — BP 126/70 | HR 80 | Temp 98.0°F | Ht 62.0 in | Wt 186.8 lb

## 2023-08-06 DIAGNOSIS — R0602 Shortness of breath: Secondary | ICD-10-CM

## 2023-08-06 DIAGNOSIS — R053 Chronic cough: Secondary | ICD-10-CM | POA: Diagnosis not present

## 2023-08-06 LAB — CBC WITH DIFFERENTIAL/PLATELET
Abs Immature Granulocytes: 0.01 10*3/uL (ref 0.00–0.07)
Basophils Absolute: 0 10*3/uL (ref 0.0–0.1)
Basophils Relative: 0 %
Eosinophils Absolute: 0.2 10*3/uL (ref 0.0–0.5)
Eosinophils Relative: 3 %
HCT: 41.5 % (ref 36.0–46.0)
Hemoglobin: 14.4 g/dL (ref 12.0–15.0)
Immature Granulocytes: 0 %
Lymphocytes Relative: 33 %
Lymphs Abs: 2.2 10*3/uL (ref 0.7–4.0)
MCH: 29.6 pg (ref 26.0–34.0)
MCHC: 34.7 g/dL (ref 30.0–36.0)
MCV: 85.2 fL (ref 80.0–100.0)
Monocytes Absolute: 0.5 10*3/uL (ref 0.1–1.0)
Monocytes Relative: 8 %
Neutro Abs: 3.6 10*3/uL (ref 1.7–7.7)
Neutrophils Relative %: 56 %
Platelets: 185 10*3/uL (ref 150–400)
RBC: 4.87 MIL/uL (ref 3.87–5.11)
RDW: 12.2 % (ref 11.5–15.5)
WBC: 6.5 10*3/uL (ref 4.0–10.5)
nRBC: 0 % (ref 0.0–0.2)

## 2023-08-06 LAB — NITRIC OXIDE: Nitric Oxide: 11

## 2023-08-06 MED ORDER — LORATADINE 10 MG PO TABS
10.0000 mg | ORAL_TABLET | Freq: Every day | ORAL | 11 refills | Status: DC
Start: 2023-08-06 — End: 2024-06-22

## 2023-08-06 MED ORDER — BUDESONIDE-FORMOTEROL FUMARATE 80-4.5 MCG/ACT IN AERO
2.0000 | INHALATION_SPRAY | Freq: Two times a day (BID) | RESPIRATORY_TRACT | 12 refills | Status: AC
Start: 2023-08-06 — End: ?

## 2023-08-06 NOTE — Progress Notes (Signed)
Synopsis: Referred in for shortness of breath and cough by Mort Sawyers, FNP  Assessment & Plan:   1. Shortness of breath 2. Chronic cough  She presents for the evaluation of exertional dyspnea as well as chronic cough that is productive of sputum.  The symptoms have been ongoing for many years leading me to think that there is an underlying process such as reactive airway disease or uncontrolled bronchiectasis.  The bronchiectasis on the chest CT from March 2024 appears to be quite minimal and mostly in the right lower lobe; this appears improved when compared to previous imaging from 2019.  Other notable findings include eosinophilia up to 518 in 2019.  Patient's other underlying conditions include hypertension, diabetes, HFpEF, hypertrophic cardiomyopathy, and severe aortic stenosis now s/p TAVR (on 02/05/2023). TTE from May of 2024 showed asymmetric LV hypertrophy of the basal-septal segment and estimated RV systolic pressure of 26.8 mmHg. Patient has followed closely by cardiology and symptom burden felt to not be of cardiac origin.  High on my differential for the patient's symptoms includes reactive airway disease such as asthma. 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 FENO was low at 11 ppb in clinic today, but this is confounded by having had prednisone recently. I will work her up with pulmonary function testing, obtain an allergen panel, and initiate ICS/LABA empirically. I will also switch her to loratadine for management of possible upper airway cough syndrome. I do not suspect that bronchiectasis is contributing significantly to her cough given it's minimal appearance on imaging, but this remains a possibility. Bronchiectasis certainly would not be contributing to her shortness of breath.  Should PFT's not show reactive airway disease, and her symptoms not change with empiric Symbicort, I would consider referral  back to cardiology for repeat right heart cath. RHC from 2024 had showed a wedge pressure of 20 mmHg (with a PA diastolic pressure of 20 mmHg), overall consistent with heart failure, and this remains a possibility and high on the differential for her shortness of breath.  - Pulmonary Function Test [facility] Only; Future - Allergen Panel (27) + IGE; Future - CBC with Differential/Platelet; Future - DG Chest 2 View; Future - loratadine (CLARITIN) 10 MG tablet; Take 1 tablet (10 mg total) by mouth daily.  Dispense: 30 tablet; Refill: 11 - budesonide-formoterol (SYMBICORT) 80-4.5 MCG/ACT inhaler; Inhale 2 puffs into the lungs in the morning and at bedtime.  Dispense: 1 each; Refill: 12   Return in about 6 weeks (around 09/17/2023).  I spent 60 minutes caring for this patient today, including preparing to see the patient, obtaining a medical history , reviewing a separately obtained history, performing a medically appropriate examination and/or evaluation, counseling and educating the patient/family/caregiver, ordering medications, tests, or procedures, documenting clinical information in the electronic health record, and independently interpreting results (not separately reported/billed) and communicating results to the patient/family/caregiver  Raechel Chute, MD [facility] 08/06/2023 2:23 PM    End of visit medications:  Meds ordered this encounter  Medications   loratadine (CLARITIN) 10 MG tablet    Sig: Take 1 tablet (10 mg total) by mouth daily.    Dispense:  30 tablet    Refill:  11   budesonide-formoterol (SYMBICORT) 80-4.5 MCG/ACT inhaler    Sig: Inhale 2 puffs into the lungs in the morning and at bedtime.    Dispense:  1 each    Refill:  12  Current Outpatient Medications:    albuterol (VENTOLIN HFA) 108 (90 Base) MCG/ACT inhaler, Inhale 2 puffs into the lungs every 4 (four) hours as needed for wheezing or shortness of breath., Disp: 1 each, Rfl: 1    amoxicillin (AMOXIL) 500 MG tablet, Take 4 tablets (2,000 mg total) by mouth as directed. 1 HOUR PRIOR TO DENTAL APPOINTMENTS, Disp: 12 tablet, Rfl: 6   aspirin 81 MG chewable tablet, Chew 1 tablet (81 mg total) by mouth daily., Disp: , Rfl:    Azelastine HCl 137 MCG/SPRAY SOLN, Place 1 spray into both nostrils daily as needed (allergies/sinus issues.)., Disp: , Rfl:    budesonide-formoterol (SYMBICORT) 80-4.5 MCG/ACT inhaler, Inhale 2 puffs into the lungs in the morning and at bedtime., Disp: 1 each, Rfl: 12   COSENTYX SENSOREADY, 300 MG, 150 MG/ML SOAJ, Inject 300 mg into the skin every 30 (thirty) days., Disp: , Rfl:    diltiazem (CARDIZEM CD) 240 MG 24 hr capsule, Take 1 capsule (240 mg total) by mouth daily., Disp: 90 capsule, Rfl: 3   DULoxetine (CYMBALTA) 30 MG capsule, Take 1 capsule (30 mg total) by mouth at bedtime., Disp: 30 capsule, Rfl: 3   esomeprazole (NEXIUM) 40 MG capsule, Take 1 capsule (40 mg total) by mouth daily., Disp: 30 capsule, Rfl: 3   Evolocumab (REPATHA SURECLICK) 140 MG/ML SOAJ, Inject 140 mg [place] every two weeks, Disp: 2 mL, Rfl: 5   furosemide (LASIX) 20 MG tablet, Take 1 tablet (20 mg total) by mouth daily., Disp: 30 tablet, Rfl: 3   gabapentin (NEURONTIN) 600 MG tablet, TAKE 2 TABLETS BY MOUTH IN THE MORNING AND AT NIGHT, Disp: 360 tablet, Rfl: 2   glucose blood (FREESTYLE LITE) test strip, Use as instructed to monitor FSBS 1x daily. Dx: E11.9, Disp: 100 each, Rfl: 1   glucose monitoring kit (FREESTYLE) monitoring kit, 1 each by Does not apply route as needed for other. Dispense one glucometer of choice, testing strips/lancets for once per day glucose checks.  QS for 1 month, 11 refills., Disp: 1 each, Rfl: 0   Lancets (FREESTYLE) lancets, Use as instructed, Disp: 100 each, Rfl: 12   lisinopril (ZESTRIL) 10 MG tablet, Take 10 mg by mouth every evening., Disp: , Rfl:    loratadine (CLARITIN) 10 MG tablet, Take 1 tablet (10 mg total) by mouth daily., Disp: 30 tablet, Rfl:  11   metoprolol succinate (TOPROL-XL) 50 MG 24 hr tablet, Take 1.5 tablets (75 mg total) by mouth daily., Disp: 135 tablet, Rfl: 3   mometasone (NASONEX) 50 MCG/ACT nasal spray, Place 2 sprays into the nose daily as needed (allergies)., Disp: , Rfl:    Semaglutide, 2 MG/DOSE, (OZEMPIC, 2 MG/DOSE,) 8 MG/3ML SOPN, INJECT 2 MG INTO THE SKIN ONCE A WEEK, Disp: 9 mL, Rfl: 1   Subjective:   PATIENT ID: Abigail [name] GENDER: female DOB: Dec 20, 1950, MRN: [id number]  Chief Complaint  Patient presents with   Consult    Shortness of breath on exertion and occasional at rest. Negative workup with cardio for SOB.    HPI  The patient is a pleasant 72 year old female presenting to clinic for evaluation of chronic shortness of breath cough.  Patient reports that her symptoms have been ongoing for a few years now, and are accompanied by a chronic cough that is mostly productive of white and yellow sputum.  The shortness of breath is mostly exertional but she does sometimes feel it at rest.  At rest, she has episodes where she feels she  cannot get enough air.  With exertion, she feels that she needs to stop to catch her breath.  The cough has been ongoing for many many years now which was previously attributed to nasal drainage.  She does not have any fevers, chills, night sweats, or weight loss.  Patient was previously seen in our clinic in 2019 by Dr. Sherene Sires.  At that point, she was diagnosed with mild bronchiectasis as well as upper airway cough syndrome.  The patient was not started on any long-acting inhalers.  Patient reports multiple prescriptions for prednisone over the past few years, last of which was a few weeks ago.  She reports that her symptoms are usually fully resolved with prednisone.  Going up, she was told she had asthma but was not on any long-acting inhalers.  Family history is notable for asthma in one of her children.  Patient is originally from [place], [place].  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  She denies any smoking or other occupational exposures.  She had over 30 chicken (currently only has 2) and some [place].  She does have 1 parakeet that she has had for a few years as well as 1 dog.  Ancillary information including prior medications, full medical/surgical/family/social histories, and PFTs (when available) are listed below and have been reviewed.   Review of Systems  Constitutional:  Negative for chills, fever, malaise/fatigue and weight loss.  Respiratory:  Negative for cough, hemoptysis, sputum production, shortness of breath and wheezing.   Cardiovascular:  Negative for chest pain.     Objective:   Vitals:   08/06/23 1349  BP: 126/70  Pulse: 80  Temp: 98 F (36.7 C)  TempSrc: Temporal  SpO2: 96%  Weight: 186 lb 12.8 oz (84.7 kg)  Height: 5\' 2"  (1.575 m)   96% on RA BMI Readings from Last 3 Encounters:  08/06/23 34.17 kg/m  08/05/23 34.39 kg/m  07/23/23 33.95 kg/m   Wt Readings from Last 3 Encounters:  08/06/23 186 lb 12.8 oz (84.7 kg)  08/05/23 188 lb (85.3 kg)  07/23/23 185 lb 9.6 oz (84.2 kg)    Physical Exam Constitutional:      Appearance: Normal appearance.  Cardiovascular:     Rate and Rhythm: Normal rate and regular rhythm.     Pulses: Normal pulses.     Heart sounds: Normal heart sounds.  Pulmonary:     Effort: Pulmonary effort is normal.     Breath sounds: Normal breath sounds. No wheezing or rales.  Abdominal:     Palpations: Abdomen is soft.  Neurological:     General: No focal deficit present.     Mental Status: She is alert and oriented to person, place, and time. Mental status is at baseline.       Ancillary Information    Past Medical History:  Diagnosis Date   Allergy    Anxiety    Arthritis    hands, knees   Cancer (HCC)    Cataract    surgery to remove -bilateral   COPD (chronic obstructive pulmonary disease) (HCC)     Depression    Diabetes mellitus (HCC)    a. A1c 6.6 in 05/2013 indicating new dx.   Fatty liver    Female stress incontinence    GERD (gastroesophageal reflux disease)    Heart murmur    no problems   Hemorrhoids    History of kidney stones  surgery to remove   Hyperlipidemia    Hyperplastic colon polyp 06/07/2008   Hypertension    Hypertriglyceridemia    IBS (irritable bowel syndrome)    Lung nodule    a. 4mm left lung nodule by CT 05/2013.   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 Excell Seltzer and Dr. Leafy Ro   SCCA (squamous cell carcinoma) of skin 01/16/2022   Left Malar Cheek (in situ) (tx p bx)   Skin cancer    Squamous cell carcinoma of skin 01/16/2022   Right Breast (in situ) (curet and 5FU)     Family History  Problem Relation Age of Onset   Breast cancer Mother    Colon polyps Maternal Aunt    Heart disease Maternal Grandfather    Kidney cancer Neg Hx    Bladder Cancer Neg Hx    Colon cancer Neg Hx    Stomach cancer Neg Hx    Rectal cancer Neg Hx    Pancreatic cancer Neg Hx    Esophageal cancer Neg Hx      Past Surgical History:  Procedure Laterality Date   CATARACT EXTRACTION W/PHACO Left 10/06/2014   Procedure: CATARACT EXTRACTION PHACO AND INTRAOCULAR LENS PLACEMENT LEFT EYE;  Surgeon: Gemma Payor, MD;  Location: [facility];  Service: Ophthalmology;  Laterality: Left;  CDE 7.35   CATARACT EXTRACTION W/PHACO Right 11/14/2014   Procedure: CATARACT EXTRACTION PHACO AND INTRAOCULAR LENS PLACEMENT RIGHT EYE;  Surgeon: Gemma Payor, MD;  Location: [facility];  Service: Ophthalmology;  Laterality: Right;  CDE:6.39   COLONOSCOPY  06/2008   hx polyps   CYSTOSCOPY W/ RETROGRADES Right 01/21/2017   Procedure: CYSTOSCOPY WITH RETROGRADE PYELOGRAM;  Surgeon: Vanna Scotland, MD;  Location: [facility];  Service: Urology;  Laterality: Right;    CYSTOSCOPY/URETEROSCOPY/HOLMIUM LASER/STENT PLACEMENT Left 01/21/2017   Procedure: CYSTOSCOPY/URETEROSCOPY/HOLMIUM LASER/STENT PLACEMENT;  Surgeon: Vanna Scotland, MD;  Location: [facility];  Service: Urology;  Laterality: Left;   DORSAL COMPARTMENT RELEASE Left 06/15/2015   Procedure: LEFT WRIST DEQUERVAINS;  Surgeon: Loreta Ave, MD;  Location: [facility];  Service: Orthopedics;  Laterality: Left;   HERNIA REPAIR     INTRAOPERATIVE TRANSTHORACIC ECHOCARDIOGRAM N/A 02/05/2023   Procedure: INTRAOPERATIVE TRANSTHORACIC ECHOCARDIOGRAM;  Surgeon: Tonny Bollman, MD;  Location: [facility] INVASIVE CV [facility];  Service: Open Heart Surgery;  Laterality: N/A;   MOUTH SURGERY     tooth ext   NASAL SINUS SURGERY     RIGHT/LEFT HEART CATH AND CORONARY ANGIOGRAPHY N/A 01/08/2023   Procedure: RIGHT/LEFT HEART CATH AND CORONARY ANGIOGRAPHY;  Surgeon: Tonny Bollman, MD;  Location: [facility] INVASIVE CV LAB;  Service: Cardiovascular;  Laterality: N/A;   SHOULDER SURGERY     right   TONSILLECTOMY     TRANSCATHETER AORTIC VALVE REPLACEMENT, TRANSFEMORAL Right 02/05/2023   Procedure: Transcatheter Aortic Valve Replacement, Transfemoral;  Surgeon: Tonny Bollman, MD;  Location: [facility] INVASIVE CV LAB;  Service: Open Heart Surgery;  Laterality: Right;   UPPER GI ENDOSCOPY     normal per patient   VENTRAL HERNIA REPAIR      Social History   Socioeconomic History   Marital status: Married    Spouse name: Lyda Jester   Number of children: 2   Years of education: trade school   Highest education level: Not on file  Occupational History   Occupation: retired  Tobacco Use  Smoking status: Never   Smokeless tobacco: Never  Vaping Use   Vaping status: Never Used  Substance and Sexual Activity   Alcohol use: No   Drug use: No   Sexual activity: Yes    Birth control/protection: Post-menopausal  Other Topics Concern   Not on file  Social History Narrative   01/24/22   From: the area   Living: with Lyda Jester, husband  (1968)   Work: retired - school bus drivver      Family: 2 children - Letta Median and Hilda Lias - 4 grandchildren      Enjoys: farm life, play with grandchildren, travel      Exercise: stairs at home 20 times a day, walking the property   Diet: not following diabetic diet but trying to do better      Safety   Seat belts: Yes    Guns: Yes  and secure   Safe in relationships: Yes       Social Determinants of Health   Financial Resource Strain: Low Risk  (02/10/2023)   Overall Financial Resource Strain (CARDIA)    Difficulty of Paying Living Expenses: Not hard at all  Food Insecurity: No Food Insecurity (02/19/2023)   Hunger Vital Sign    Worried About Running Out of Food in the Last Year: Never true    Ran Out of Food in the Last Year: Never true  Transportation Needs: No Transportation Needs (02/19/2023)   PRAPARE - Administrator, Civil Service (Medical): No    Lack of Transportation (Non-Medical): No  Physical Activity: Inactive (02/10/2023)   Exercise Vital Sign    Days of Exercise per Week: 0 days    Minutes of Exercise per Session: 0 min  Stress: No Stress Concern Present (02/10/2023)   [employer] of Occupational Health - Occupational Stress Questionnaire    Feeling of Stress : Not at all  Social Connections: Moderately Integrated (02/10/2023)   Social Connection and Isolation Panel [NHANES]    Frequency of Communication with Friends and Family: More than three times a week    Frequency of Social Gatherings with Friends and Family: More than three times a week    Attends Religious Services: More than 4 times per year    Active Member of Clubs or Organizations: No    Attends Banker Meetings: Never    Marital Status: Married  Catering manager Violence: Not At Risk (02/10/2023)   Humiliation, Afraid, Rape, and Kick questionnaire    Fear of Current or Ex-Partner: No    Emotionally Abused: No    Physically Abused: No    Sexually Abused: No      Allergies  Allergen Reactions   Statins Other (See Comments)    Failed simvastatin, pravastatin, and rosuvastatin 5 mg weekly (myalgias)   Cinnamon     Dry throat      CBC    Component Value Date/Time   WBC 6.9 03/17/2023 0741   RBC 4.89 03/17/2023 0741   HGB 14.6 03/17/2023 0741   HCT 42.1 03/17/2023 0741   PLT 176.0 03/17/2023 0741   MCV 86.1 03/17/2023 0741   MCV 89.7 05/20/2013 0938   MCH 29.8 02/06/2023 0108   MCHC 34.8 03/17/2023 0741   RDW 13.3 03/17/2023 0741   LYMPHSABS 1.6 03/17/2023 0741   MONOABS 0.5 03/17/2023 0741   EOSABS 0.2 03/17/2023 0741   BASOSABS 0.0 03/17/2023 0741    Pulmonary Functions Testing Results:    Latest Ref Rng & Units 01/22/2018  12:52 PM  PFT Results  FVC-Pre L 2.46   FVC-Predicted Pre % 87   FVC-Post L 2.43   FVC-Predicted Post % 86   Pre FEV1/FVC % % 81   Post FEV1/FCV % % 81   FEV1-Pre L 2.00   FEV1-Predicted Pre % 94   FEV1-Post L 1.96   DLCO uncorrected ml/min/mmHg 21.20   DLCO UNC% % 98   DLCO corrected ml/min/mmHg 21.13   DLCO COR %Predicted % 98   DLVA Predicted % 107   TLC L 4.82   TLC % Predicted % 101   RV % Predicted % 99     Outpatient Medications Prior to Visit  Medication Sig Dispense Refill   albuterol (VENTOLIN HFA) 108 (90 Base) MCG/ACT inhaler Inhale 2 puffs into the lungs every 4 (four) hours as needed for wheezing or shortness of breath. 1 each 1   amoxicillin (AMOXIL) 500 MG tablet Take 4 tablets (2,000 mg total) by mouth as directed. 1 HOUR PRIOR TO DENTAL APPOINTMENTS 12 tablet 6   aspirin 81 MG chewable tablet Chew 1 tablet (81 mg total) by mouth daily.     Azelastine HCl 137 MCG/SPRAY SOLN Place 1 spray into both nostrils daily as needed (allergies/sinus issues.).     COSENTYX SENSOREADY, 300 MG, 150 MG/ML SOAJ Inject 300 mg into the skin every 30 (thirty) days.     diltiazem (CARDIZEM CD) 240 MG 24 hr capsule Take 1 capsule (240 mg total) by mouth daily. 90 capsule 3   DULoxetine (CYMBALTA) 30  MG capsule Take 1 capsule (30 mg total) by mouth at bedtime. 30 capsule 3   esomeprazole (NEXIUM) 40 MG capsule Take 1 capsule (40 mg total) by mouth daily. 30 capsule 3   Evolocumab (REPATHA SURECLICK) 140 MG/ML SOAJ Inject 140 mg [facility] every two weeks 2 mL 5   furosemide (LASIX) 20 MG tablet Take 1 tablet (20 mg total) by mouth daily. 30 tablet 3   gabapentin (NEURONTIN) 600 MG tablet TAKE 2 TABLETS BY MOUTH IN THE MORNING AND AT NIGHT 360 tablet 2   glucose blood (FREESTYLE LITE) test strip Use as instructed to monitor FSBS 1x daily. Dx: E11.9 100 each 1   glucose monitoring kit (FREESTYLE) monitoring kit 1 each by Does not apply route as needed for other. Dispense one glucometer of choice, testing strips/lancets for once per day glucose checks.  QS for 1 month, 11 refills. 1 each 0   Lancets (FREESTYLE) lancets Use as instructed 100 each 12   lisinopril (ZESTRIL) 10 MG tablet Take 10 mg by mouth every evening.     metoprolol succinate (TOPROL-XL) 50 MG 24 hr tablet Take 1.5 tablets (75 mg total) by mouth daily. 135 tablet 3   mometasone (NASONEX) 50 MCG/ACT nasal spray Place 2 sprays into the nose daily as needed (allergies).     Semaglutide, 2 MG/DOSE, (OZEMPIC, 2 MG/DOSE,) 8 MG/3ML SOPN INJECT 2 MG INTO THE SKIN ONCE A WEEK 9 mL 1   No facility-administered medications prior to visit.

## 2023-08-07 ENCOUNTER — Ambulatory Visit: Payer: Medicare Other | Admitting: Dermatology

## 2023-08-08 ENCOUNTER — Telehealth (HOSPITAL_COMMUNITY): Payer: Self-pay | Admitting: Emergency Medicine

## 2023-08-08 NOTE — Telephone Encounter (Signed)
Attempted to call patient regarding upcoming cardiac CT appointment. °Left message on voicemail with name and callback number °Dwan Fennel RN Navigator Cardiac Imaging °[facility] °[phone] Office °[phone] Cell ° °

## 2023-08-09 LAB — ALLERGEN PANEL (27) + IGE
Alternaria Alternata IgE: <0.1 kU/L
Aspergillus Fumigatus IgE: <0.1 kU/L
Bahia Grass IgE: <0.1 kU/L
Bermuda Grass IgE: <0.1 kU/L
Cat Dander IgE: <0.1 kU/L
Cedar, Mountain IgE: <0.1 kU/L
Cladosporium Herbarum IgE: <0.1 kU/L
Cocklebur IgE: <0.1 kU/L
Cockroach, American IgE: <0.1 kU/L
Common Silver Birch IgE: <0.1 kU/L
D Farinae IgE: <0.1 kU/L
D Pteronyssinus IgE: 0.15 kU/L — AB
Dog Dander IgE: <0.1 kU/L
Elm, American IgE: <0.1 kU/L
Hickory, White IgE: <0.1 kU/L
IgE (Immunoglobulin E), Serum: 29 [IU]/mL (ref 6–495)
Johnson Grass IgE: <0.1 kU/L
Kentucky Bluegrass IgE: <0.1 kU/L
Maple/Box Elder IgE: <0.1 kU/L
Mucor Racemosus IgE: <0.1 kU/L
Oak, White IgE: <0.1 kU/L
Penicillium Chrysogen IgE: <0.1 kU/L
Pigweed, Rough IgE: <0.1 kU/L
Plantain, English IgE: <0.1 kU/L
Ragweed, Short IgE: <0.1 kU/L
Setomelanomma Rostrat: <0.1 kU/L
Timothy Grass IgE: <0.1 kU/L
White Mulberry IgE: <0.1 kU/L

## 2023-08-11 ENCOUNTER — Other Ambulatory Visit: Payer: Self-pay | Admitting: Internal Medicine

## 2023-08-11 ENCOUNTER — Ambulatory Visit (HOSPITAL_COMMUNITY)
Admission: RE | Admit: 2023-08-11 | Discharge: 2023-08-11 | Disposition: A | Discharge status: Home / Self Care | Payer: Medicare Other | Source: Ambulatory Visit | Attending: Internal Medicine | Admitting: Internal Medicine

## 2023-08-11 DIAGNOSIS — I422 Other hypertrophic cardiomyopathy: Secondary | ICD-10-CM

## 2023-08-11 MED ORDER — GADOBUTROL 1 MMOL/ML IV SOLN
10.0000 mL | Freq: Once | INTRAVENOUS | Status: AC | PRN
  Administered 2023-08-11: 10 mL via INTRAVENOUS

## 2023-08-12 NOTE — Progress Notes (Signed)
Care Coordination Note  08/12/2023 Name: Abigail [name] MRN: [id number] DOB: 1951-05-25  [name] is a 72 y.o. year old female who is a primary care patient of Mort Sawyers, FNP and is actively engaged with the care management team. I reached out to [name] by phone today to assist with re-scheduling a follow up visit with the RN Case Manager  Follow up plan: Telephone appointment with care management team member scheduled for: 08/20/2023  Burman Nieves, [facility] Care Coordination Care Guide Direct Dial: [phone]

## 2023-08-13 ENCOUNTER — Ambulatory Visit: Payer: Medicare Other | Admitting: Internal Medicine

## 2023-08-15 ENCOUNTER — Encounter: Payer: Self-pay | Admitting: Student

## 2023-08-15 ENCOUNTER — Ambulatory Visit: Payer: Medicare Other | Attending: Internal Medicine | Admitting: Student

## 2023-08-15 VITALS — BP 116/60 | HR 72 | Ht 62.0 in | Wt 189.0 lb

## 2023-08-15 DIAGNOSIS — I10 Essential (primary) hypertension: Secondary | ICD-10-CM

## 2023-08-15 DIAGNOSIS — R0609 Other forms of dyspnea: Secondary | ICD-10-CM | POA: Diagnosis not present

## 2023-08-15 DIAGNOSIS — I517 Cardiomegaly: Secondary | ICD-10-CM

## 2023-08-15 DIAGNOSIS — I2510 Atherosclerotic heart disease of native coronary artery without angina pectoris: Secondary | ICD-10-CM

## 2023-08-15 DIAGNOSIS — Z952 Presence of prosthetic heart valve: Secondary | ICD-10-CM | POA: Diagnosis not present

## 2023-08-15 DIAGNOSIS — E785 Hyperlipidemia, unspecified: Secondary | ICD-10-CM

## 2023-08-15 NOTE — Progress Notes (Signed)
Cardiology Office Note    Date:  08/15/2023  ID:  Abigail [name], Abigail [name] Apr 16, 1951, MRN [id number] Cardiologist: Marjo Bicker, MD    History of Present Illness:    Abigail [name] is a 72 y.o. 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   She was examined by Carlean Jews, PA in 07/2023 and reported having chronic shortness of breath which had been unchanged since before TAVR and denied any associated chest pain or palpitations. Recent labs had shown a normal BNP and she was continued on Lasix 20 mg daily  Recent echocardiogram had shown a mild resting LVOT gradient and this reviewed with Dr. Izora Ribas and was recommended to continue with plans for cardiac MRI and she would not be a candidate for Mavacamten. She was continued on Cardizem CD 240 mg daily and Toprol-XL 75 mg daily. Cardiac MRI showed no evidence of cardiac amyloidosis and she did have asymmetric LV hypertrophy but did not meet criteria for hypertrophic cardiomyopathy. EF was estimated at 73% with  RV EF at 63%. Was also evaluated by Pulmonology in the interim with plans for allergen panel and PFT's. If PFT's were normal, it was recommended to consider a repeat RHC given that she did have increased pulmonary wedge pressure of 20 mmHg by prior catheterization in 01/2023.  In talking with the patient today, she reports her shortness of breath did improve following recent inhaler adjustments by Pulmonology. Says this is typically worse during the humidity or when they use wood heat. Reports occasional episodes of chest pain which occur at rest or with activity but spontaneously resolve within a few seconds or minutes. No recent palpitations, orthopnea or PND. Does have occasional lower extremity edema when traveling. She does take Lasix 20 mg daily and  volume status has been stable with this.  Studies Reviewed:   EKG: EKG is not ordered today.  Cardiac Catheterization: 01/2023   Prox LAD to Mid LAD lesion is 40% stenosed.   There is severe aortic valve stenosis.   1.  Patent coronary arteries with mild diffuse nonobstructive plaquing, mid LAD with 40% stenosis, widely patent left main, left circumflex, and RCA 2.  Severe calcific aortic stenosis with mean transvalvular gradient 61 mmHg and calculated aortic valve area 0.63 cm 3.  Mildly increased right heart diastolic filling pressures with increased pulmonary wedge pressure of 20 mmHg and preserved cardiac output of 4.74 L/min   Recommendations: Medical therapy for mild nonobstructive CAD, continue evaluation for aortic valve replacement.  Echocardiogram: 03/2023 IMPRESSIONS     1. Left ventricular ejection fraction, by estimation, is 70 to 75%. The  left ventricle has hyperdynamic function. The left ventricle has no  regional wall motion abnormalities. There is moderate asymmetric left  ventricular hypertrophy of the basal-septal   segment. Left ventricular diastolic parameters are indeterminate.   2. Right ventricular systolic function is normal. The right ventricular  size is normal. There is normal pulmonary artery systolic pressure. The  estimated right ventricular systolic pressure is 26.8 mmHg.   3. The mitral valve is degenerative. Trivial mitral valve regurgitation.  No evidence of mitral stenosis. The mean mitral valve gradient is 3.0 mmHg  with average heart rate of 69 bpm. Severe mitral annular calcification.   4. The aortic valve has been repaired/replaced. Aortic valve  regurgitation is not visualized. There is a 23 mm Edwards Sapien  prosthetic (TAVR) valve present in the aortic position. Procedure Date:  02/05/23. Echo findings are consistent with normal  structure and function of the aortic valve prosthesis. Aortic valve mean  gradient measures 14.4 mmHg.   5.  The inferior vena cava is normal in size with greater than 50%  respiratory variability, suggesting right atrial pressure of 3 mmHg.    cMRI: 08/2023 IMPRESSION: 1.  No evidence of cardiac amyloidosis   2. Asymmetric LV hypertrophy measuring 13mm in basal septum (8mm in posterior wall), not meeting criteria for hypertrophic cardiomyopathy (<91mm)   3.  No late gadolinium enhancement to suggest myocardial scar   4.  Normal LV size with hyperdynamic systolic function (EF 73%)   5.  Normal RV size and systolic function (EF 63%)   Physical Exam:   VS:  BP 116/60   Pulse 72   Ht 5\' 2"  (1.575 m)   Wt 189 lb (85.7 kg)   SpO2 95%   BMI 34.57 kg/m    Wt Readings from Last 3 Encounters:  08/15/23 189 lb (85.7 kg)  08/06/23 186 lb 12.8 oz (84.7 kg)  08/05/23 188 lb (85.3 kg)     GEN: Well nourished, well developed female appearing in no acute distress NECK: No JVD; No carotid bruits CARDIAC: RRR, no murmurs, rubs, gallops RESPIRATORY:  Clear to auscultation without rales, wheezing or rhonchi  ABDOMEN: Appears non-distended. No obvious abdominal masses. EXTREMITIES: No clubbing or cyanosis. No pitting edema.  Distal pedal pulses are 2+ bilaterally.   Assessment and Plan:   1. Dyspnea on Exertion - Unclear etiology but she reports her shortness of breath has been occurring for over a year but this did improve with recent adjustments in her inhalers by Pulmonology. She appears euvolemic by examination today and BNP was normal at 74 when checked in 07/2023. Would continue current diuretic therapy with Lasix 20 mg daily and we reviewed that she can take an extra tablet if needed for acute weight gain or worsening edema.  2. Aortic Stenosis - She did undergo TAVR in 02/2023 and most recent echocardiogram in 03/2023 showed her aortic valve was functioning normally with mean gradient of 14.4 mmHg. [facility] with plans for a follow-up echocardiogram in  02/2024.  3. LVH - It was previously felt that she had HOCM but recent cardiac MRI earlier this month showed no evidence of cardiac amyloidosis and she did have asymmetric LVH but did not meet criteria for hypertrophic cardiomyopathy. Continue current medical therapy with Cardizem CD 240 mg daily and Toprol-XL 75 mg daily.  4. CAD - She had mild nonobstructive disease with only 40% mid LAD stenosis by cardiac catheterization in 01/2023. Her recent episodes of chest pain overall seem atypical for angina and given recent reassuring catheterization, would not plan for further ischemic evaluation at this time. - Continue with risk factor modification. Remains on ASA 81 mg daily, Toprol-XL 75mg  daily and Repatha.  5. HTN - Blood pressure is well-controlled at 116/60 during today's visit. Continue current medical therapy with Cardizem CD 240 mg daily, Lisinopril 10 mg daily and Toprol-XL 75 mg daily.  6. HLD - LDL was at 58 when checked in 03/2023. Continue current medical therapy with Repatha as she was previously intolerant to statins.   Signed, Ellsworth Lennox, PA-C

## 2023-08-15 NOTE — Patient Instructions (Signed)
Medication Instructions:  Your physician recommends that you continue on your current medications as directed. Please refer to the Current Medication list given to you today.   Labwork: None today  Testing/Procedures: None today  Follow-Up: 4-5 months Dr.Mallipeddi  Any Other Special Instructions Will Be Listed Below (If Applicable).  If you need a refill on your cardiac medications before your next appointment, please call your pharmacy.

## 2023-08-20 ENCOUNTER — Ambulatory Visit: Payer: Self-pay

## 2023-08-20 NOTE — Patient Outreach (Signed)
Care Coordination   Follow Up Visit Note   08/20/2023 Name: ZINEB ZUNK MRN: [id number] DOB: 06-Aug-1951  Garry Heater Abramczyk is a 72 y.o. year old female who sees [place], [name], FNP for primary care. I spoke with  [name] by phone today.  What matters to the patients health and wellness today?  Patient states she still has shortness of breath at times. She reports her pulmonologist added Symbicort inhaler to her treatment plan.  Patient states she notices " a little " improvement with the this.  Patient states she remains active. She reports chopping wood at least 2 days per week. Patient states she has noticed a dryer cough over the last week. She states she burns wood for heat in her home.  Patient states she is now using a humidifier.  She states she is not sure how long she can use it during the day.     Goals Addressed             This Visit's Progress    Continued improvement post TAVR and management of health conditions.       Interventions Today    Flowsheet Row Most Recent Value  Chronic Disease   Chronic disease during today's visit Chronic Obstructive Pulmonary Disease (COPD), Other  [dyspnea on exertion, cough]  General Interventions   General Interventions Discussed/Reviewed General Interventions Reviewed, Doctor Visits, Vaccines  [evaluation of current treatment plan for mentioned health conditions and patients adherence to plan as established by provider.  Assessed for ongoing  SOB/ cough.]  Vaccines Flu, Pneumonia, RSV  [Discussed and encouraged.]  Doctor Visits Discussed/Reviewed Doctor Visits Reviewed  Algis Downs to keep follow up visits with providers.  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  Advised to rinse out mouth after taking symbicort due to risk of thrush. Encouraged hydration. Reviewed cardiology visit from 08/15/23 and confirmed patient understands instruction regarding lasix medication]              SDOH assessments and interventions completed:  No     Care Coordination Interventions:  Yes, provided   Follow up plan: Follow up call scheduled for 10/15/23    Encounter Outcome:  Patient Visit Completed   [name] RN,BSN,CCM [facility] Care Coordination [phone] direct line

## 2023-08-20 NOTE — Patient Instructions (Signed)
Visit Information  Thank you for taking time to visit with me today. Please don't hesitate to contact me if I can be of assistance to you.   Following are the goals we discussed today:   Goals Addressed             This Visit's Progress    Continued improvement post TAVR and management of health conditions.       Interventions Today    Flowsheet Row Most Recent Value  Chronic Disease   Chronic disease during today's visit Chronic Obstructive Pulmonary Disease (COPD), Other  [dyspnea on exertion, cough]  General Interventions   General Interventions Discussed/Reviewed General Interventions Reviewed, Doctor Visits, Vaccines  [evaluation of current treatment plan for mentioned health conditions and patients adherence to plan as established by provider.  Assessed for ongoing  SOB/ cough.]  Vaccines Flu, Pneumonia, RSV  [Discussed and encouraged.]  Doctor Visits Discussed/Reviewed Doctor Visits Reviewed  Algis Downs to keep follow up visits with providers.  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  Advised to rinse out mouth after taking symbicort due to risk of thrush. Encouraged hydration. Reviewed cardiology visit from 08/15/23 and confirmed patient understands instruction regarding lasix medication]              Our next appointment is by telephone on 10/15/23 at 10 am  Please call the care guide team at [phone] if you need to cancel or reschedule your appointment.   If you are experiencing a Mental Health or Behavioral Health Crisis or need someone  to talk to, please call the Suicide and Crisis Lifeline: 988 call 1-800-273-TALK (toll free, 24 hour hotline)  The patient verbalized understanding of instructions, educational materials, and care plan provided today and agreed to receive a mailed copy of patient instructions, educational materials, and care plan.   [name] RN,BSN,CCM [facility] Care Coordination [phone] direct line

## 2023-08-26 NOTE — Progress Notes (Signed)
noted 

## 2023-09-11 ENCOUNTER — Ambulatory Visit: Payer: Medicare Other

## 2023-09-11 ENCOUNTER — Encounter: Payer: Self-pay | Admitting: Emergency Medicine

## 2023-09-11 ENCOUNTER — Ambulatory Visit
Admission: EM | Admit: 2023-09-11 | Discharge: 2023-09-11 | Disposition: A | Discharge status: Home / Self Care | Payer: Medicare Other | Attending: Family Medicine | Admitting: Family Medicine

## 2023-09-11 ENCOUNTER — Other Ambulatory Visit: Payer: Self-pay

## 2023-09-11 DIAGNOSIS — R42 Dizziness and giddiness: Secondary | ICD-10-CM | POA: Diagnosis not present

## 2023-09-11 MED ORDER — MECLIZINE HCL 12.5 MG PO TABS: 12.5000 mg | ORAL_TABLET | Freq: Three times a day (TID) | ORAL | 0 refills | Status: AC | PRN

## 2023-09-11 MED ORDER — FLUTICASONE PROPIONATE 50 MCG/ACT NA SUSP: 1.0000 | Freq: Two times a day (BID) | NASAL | 2 refills | Status: DC

## 2023-09-11 NOTE — ED Triage Notes (Addendum)
Pt reports bilateral ear fullness and intermittent dizziness since last night. Pt reports hx of vertigo. Denies any known injury or pain at this time.

## 2023-09-11 NOTE — ED Provider Notes (Signed)
[facility]    CSN: [id number] Arrival date & time: 09/11/23  1106      History   Chief Complaint Chief Complaint  Patient presents with   Ear Fullness    HPI Abigail [name] is a 72 y.o. female.   Patient presenting today with 2-day history of room spinning dizziness that started when she was rolling over in bed 2 nights ago.  She states the dizzy spells come on with movement of the head, relieved by being still.  Some mild nausea associated but no vomiting.  Denies chest pain, shortness of breath, palpitations, visual change, headache, abdominal pain.  Does have a history of vertigo and has felt similar.  Not tried anything for symptoms thus far.    Past Medical History:  Diagnosis Date   Allergy    Anxiety    Arthritis    hands, knees   Cancer (HCC)    Cataract    surgery to remove -bilateral   COPD (chronic obstructive pulmonary disease) (HCC)    Depression    Diabetes mellitus (HCC)    a. A1c 6.6 in 05/2013 indicating new dx.   Fatty liver    Female stress incontinence    GERD (gastroesophageal reflux disease)    Heart murmur    no problems   Hemorrhoids    History of kidney stones    surgery to remove   Hyperlipidemia    Hyperplastic colon polyp 06/07/2008   Hypertension    Hypertriglyceridemia    IBS (irritable bowel syndrome)    Lung nodule    a. 4mm left lung nodule by CT 05/2013.   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 Excell Seltzer and Dr. 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   Heart murmur 11/11/2022   Psoriasis 11/11/2022   DOE (dyspnea on exertion) 11/11/2022   Pulmonary nodule less than 6 mm determined by computed tomography of lung 11/11/2022   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   Statin intolerance 11/11/2022   Adrenal adenoma, left 11/08/2022   Hepatic steatosis 11/08/2022   Recurrent pansinusitis 11/08/2022   Eosinophilic esophagitis 06/01/2021   Primary osteoarthritis of both first carpometacarpal joints 05/17/2020   Dysphagia 09/15/2019   Upper airway cough syndrome 02/12/2018   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   Diabetic neuropathy (HCC) 04/02/2017   Aortic valve sclerosis 04/02/2017   IRRITABLE BOWEL SYNDROME 05/18/2008   Hypercholesterolemia with LDL greater than 190 mg/dL 00/93/8182   Essential hypertension 05/17/2008   Hemorrhoids 05/17/2008   GERD 05/17/2008   FATTY LIVER DISEASE 05/17/2008   Osteoarthritis 05/17/2008   STRESS INCONTINENCE 05/17/2008    Past Surgical History:  Procedure Laterality Date   CATARACT EXTRACTION W/PHACO Left 10/06/2014  Procedure: CATARACT EXTRACTION PHACO AND INTRAOCULAR LENS PLACEMENT LEFT EYE;  Surgeon: Gemma Payor, MD;  Location: [facility];  Service: Ophthalmology;  Laterality: Left;  CDE 7.35   CATARACT EXTRACTION W/PHACO Right 11/14/2014   Procedure: CATARACT EXTRACTION PHACO AND INTRAOCULAR LENS PLACEMENT RIGHT EYE;  Surgeon: Gemma Payor, MD;  Location: [facility];  Service: Ophthalmology;  Laterality: Right;  CDE:6.39   COLONOSCOPY  06/2008   hx polyps   CYSTOSCOPY W/ RETROGRADES Right 01/21/2017   Procedure: CYSTOSCOPY WITH RETROGRADE PYELOGRAM;  Surgeon: Vanna Scotland, MD;  Location: [facility];  Service:  Urology;  Laterality: Right;   CYSTOSCOPY/URETEROSCOPY/HOLMIUM LASER/STENT PLACEMENT Left 01/21/2017   Procedure: CYSTOSCOPY/URETEROSCOPY/HOLMIUM LASER/STENT PLACEMENT;  Surgeon: Vanna Scotland, MD;  Location: [facility];  Service: Urology;  Laterality: Left;   DORSAL COMPARTMENT RELEASE Left 06/15/2015   Procedure: LEFT WRIST DEQUERVAINS;  Surgeon: Loreta Ave, MD;  Location: [facility];  Service: Orthopedics;  Laterality: Left;   HERNIA REPAIR     INTRAOPERATIVE TRANSTHORACIC ECHOCARDIOGRAM N/A 02/05/2023   Procedure: INTRAOPERATIVE TRANSTHORACIC ECHOCARDIOGRAM;  Surgeon: Tonny Bollman, MD;  Location: [facility] INVASIVE CV LAB;  Service: Open Heart Surgery;  Laterality: N/A;   MOUTH SURGERY     tooth ext   NASAL SINUS SURGERY     RIGHT/LEFT HEART CATH AND CORONARY ANGIOGRAPHY N/A 01/08/2023   Procedure: RIGHT/LEFT HEART CATH AND CORONARY ANGIOGRAPHY;  Surgeon: Tonny Bollman, MD;  Location: [facility] INVASIVE CV LAB;  Service: Cardiovascular;  Laterality: N/A;   SHOULDER SURGERY     right   TONSILLECTOMY     TRANSCATHETER AORTIC VALVE REPLACEMENT, TRANSFEMORAL Right 02/05/2023   Procedure: Transcatheter Aortic Valve Replacement, Transfemoral;  Surgeon: Tonny Bollman, MD;  Location: [facility] INVASIVE CV LAB;  Service: Open Heart Surgery;  Laterality: Right;   UPPER GI ENDOSCOPY     normal per patient   VENTRAL HERNIA REPAIR      OB History   No obstetric history on file.      Home Medications    Prior to Admission medications   Medication Sig Start Date End Date Taking? Authorizing Provider  fluticasone (FLONASE) 50 MCG/ACT nasal spray Place 1 spray into both nostrils 2 (two) times daily. 09/11/23  Yes Particia Nearing, PA-C  meclizine (ANTIVERT) 12.5 MG tablet Take 1 tablet (12.5 mg total) by mouth 3 (three) times daily as needed for dizziness. 09/11/23  Yes Particia Nearing, PA-C  albuterol (VENTOLIN HFA) 108 (90 Base) MCG/ACT inhaler Inhale 2 puffs into the lungs every 4  (four) hours as needed for wheezing or shortness of breath. 07/16/23   Mort Sawyers, FNP  amoxicillin (AMOXIL) 500 MG tablet Take 4 tablets (2,000 mg total) by mouth as directed. 1 HOUR PRIOR TO DENTAL APPOINTMENTS 03/12/23   Georgie Chard D, NP  aspirin 81 MG chewable tablet Chew 1 tablet (81 mg total) by mouth daily. 02/07/23   Georgie Chard D, NP  Azelastine HCl 137 MCG/SPRAY SOLN Place 1 spray into both nostrils daily as needed (allergies/sinus issues.). 07/27/21   [provider]  budesonide-formoterol (SYMBICORT) 80-4.5 MCG/ACT inhaler Inhale 2 puffs into the lungs in the morning and at bedtime. 08/06/23   Dgayli, Lianne Bushy, MD  COSENTYX SENSOREADY, 300 MG, 150 MG/ML SOAJ Inject 300 mg into the skin every 30 (thirty) days. 12/10/22   [provider]  diltiazem (CARDIZEM CD) 240 MG 24 hr capsule Take 1 capsule (240 mg total) by mouth daily. 03/12/23 08/15/23  Georgie Chard D, NP  DULoxetine (CYMBALTA) 30 MG capsule Take  1 capsule (30 mg total) by mouth at bedtime. 03/26/23   Hyatt, Max T, DPM  Evolocumab (REPATHA SURECLICK) 140 MG/ML SOAJ Inject 140 mg [place] every two weeks 07/14/23   Mort Sawyers, FNP  furosemide (LASIX) 20 MG tablet Take 1 tablet (20 mg total) by mouth daily. 07/21/23   Mort Sawyers, FNP  gabapentin (NEURONTIN) 600 MG tablet TAKE 2 TABLETS BY MOUTH IN THE MORNING AND AT NIGHT 03/26/23   Hyatt, Max T, DPM  glucose blood (FREESTYLE LITE) test strip Use as instructed to monitor FSBS 1x daily. Dx: E11.9 11/19/22   Mort Sawyers, FNP  glucose monitoring kit (FREESTYLE) monitoring kit 1 each by Does not apply route as needed for other. Dispense one glucometer of choice, testing strips/lancets for once per day glucose checks.  QS for 1 month, 11 refills. 05/28/13   Shade Flood, MD  Lancets (FREESTYLE) lancets Use as instructed 07/09/19   Salley Scarlet, MD  lisinopril (ZESTRIL) 10 MG tablet Take 10 mg by mouth every evening.    [provider]  loratadine  (CLARITIN) 10 MG tablet Take 1 tablet (10 mg total) by mouth daily. 08/06/23 08/05/24  Raechel Chute, MD  metoprolol succinate (TOPROL-XL) 50 MG 24 hr tablet Take 1.5 tablets (75 mg total) by mouth daily. 03/14/23   Filbert Schilder, NP  mometasone (NASONEX) 50 MCG/ACT nasal spray Place 2 sprays into the nose daily as needed (allergies).    [provider]  Semaglutide, 2 MG/DOSE, (OZEMPIC, 2 MG/DOSE,) 8 MG/3ML SOPN INJECT 2 MG INTO THE SKIN ONCE A WEEK 04/25/23   Mort Sawyers, FNP    Family History Family History  Problem Relation Age of Onset   Breast cancer Mother    Colon polyps Maternal Aunt    Heart disease Maternal Grandfather    Kidney cancer Neg Hx    Bladder Cancer Neg Hx    Colon cancer Neg Hx    Stomach cancer Neg Hx    Rectal cancer Neg Hx    Pancreatic cancer Neg Hx    Esophageal cancer Neg Hx     Social History Social History   Tobacco Use   Smoking status: Never   Smokeless tobacco: Never  Vaping Use   Vaping status: Never Used  Substance Use Topics   Alcohol use: No   Drug use: No     Allergies   Statins and Cinnamon   Review of Systems Review of Systems Per HPI  Physical Exam Triage Vital Signs ED Triage Vitals  Encounter Vitals Group     BP 09/11/23 1207 123/68     Systolic BP Percentile --      Diastolic BP Percentile --      Pulse Rate 09/11/23 1207 70     Resp 09/11/23 1207 20     Temp 09/11/23 1207 98.4 F (36.9 C)     Temp Source 09/11/23 1207 Oral     SpO2 09/11/23 1207 92 %     Weight --      Height --      Head Circumference --      Peak Flow --      Pain Score 09/11/23 1208 0     Pain Loc --      Pain Education --      Exclude from Growth Chart --    No data found.  Updated Vital Signs BP 123/68 (BP Location: Right Arm)   Pulse 70   Temp 98.4 F (36.9 C) (Oral)  Resp 20   SpO2 92%   Visual Acuity Right Eye Distance:   Left Eye Distance:   Bilateral Distance:    Right Eye Near:   Left Eye Near:     Bilateral Near:     Physical Exam Vitals and nursing note reviewed.  Constitutional:      Appearance: Normal appearance. She is not ill-appearing.  HENT:     Head: Atraumatic.     Ears:     Comments: Mild middle ear effusions bilaterally    Nose: Nose normal.     Mouth/Throat:     Mouth: Mucous membranes are moist.     Pharynx: Oropharynx is clear.  Eyes:     Extraocular Movements: Extraocular movements intact.     Conjunctiva/sclera: Conjunctivae normal.  Cardiovascular:     Rate and Rhythm: Normal rate and regular rhythm.     Heart sounds: Normal heart sounds.  Pulmonary:     Effort: Pulmonary effort is normal.     Breath sounds: Normal breath sounds. No wheezing or rales.  Abdominal:     General: Bowel sounds are normal. There is no distension.     Palpations: Abdomen is soft.     Tenderness: There is no abdominal tenderness. There is no guarding.  Musculoskeletal:        General: Normal range of motion.     Cervical back: Normal range of motion and neck supple.  Skin:    General: Skin is warm and dry.  Neurological:     General: No focal deficit present.     Mental Status: She is alert and oriented to person, place, and time.     Cranial Nerves: No cranial nerve deficit.     Motor: No weakness.     Gait: Gait normal.  Psychiatric:        Mood and Affect: Mood normal.        Thought Content: Thought content normal.        Judgment: Judgment normal.      UC Treatments / Results  Labs (all labs ordered are listed, but only abnormal results are displayed) Labs Reviewed - No data to display  EKG   Radiology No results found.  Procedures Procedures (including critical care time)  Medications Ordered in UC Medications - No data to display  Initial Impression / Assessment and Plan / UC Course  I have reviewed the triage vital signs and the nursing notes.  Pertinent labs & imaging results that were available during my care of the patient were reviewed by  me and considered in my medical decision making (see chart for details).     Vital signs and exam reassuring today, no focal neurologic deficits.  Consistent with positional vertigo.  Treat with meclizine, Flonase, supportive over-the-counter medications, home care.  Handout for Epley maneuvers given.  Follow-up with PCP for recheck.  Return for worsening symptoms.  Final Clinical Impressions(s) / UC Diagnoses   Final diagnoses:  Vertigo   Discharge Instructions   None    ED Prescriptions     Medication Sig Dispense Auth. Provider   fluticasone (FLONASE) 50 MCG/ACT nasal spray Place 1 spray into both nostrils 2 (two) times daily. 16 g Particia Nearing, [place]   meclizine (ANTIVERT) 12.5 MG tablet Take 1 tablet (12.5 mg total) by mouth 3 (three) times daily as needed for dizziness. 30 tablet Particia Nearing, [place]      PDMP not reviewed this encounter.   Particia Nearing, [place] 09/11/23  1409  

## 2023-09-18 ENCOUNTER — Encounter: Payer: Self-pay | Admitting: Gastroenterology

## 2023-09-22 ENCOUNTER — Ambulatory Visit: Payer: Medicare Other | Admitting: Student in an Organized Health Care Education/Training Program

## 2023-09-24 ENCOUNTER — Ambulatory Visit: Payer: Medicare Other | Admitting: Student in an Organized Health Care Education/Training Program

## 2023-10-15 ENCOUNTER — Ambulatory Visit: Payer: Self-pay

## 2023-10-15 NOTE — Patient Instructions (Signed)
Visit Information  Thank you for taking time to visit with me today. Please don't hesitate to contact me if I can be of assistance to you.   Following are the goals we discussed today:   Goals Addressed             This Visit's Progress    Continued improvement post TAVR and management of health conditions.       Interventions Today    Flowsheet Row Most Recent Value  Chronic Disease   Chronic disease during today's visit Chronic Obstructive Pulmonary Disease (COPD), Other  [dizziness]  General Interventions   General Interventions Discussed/Reviewed General Interventions Reviewed, Doctor Visits  [evaluation of current treatment plan for COPD/ dizziness and patients adherence to plan as established by provider. Assessed for COPD and ongoing dizziness symptoms.]  Doctor Visits Discussed/Reviewed Doctor Visits Reviewed  [name] upcoming provider visits.]  Education Interventions   Education Provided Provided Education  [Patient advised to monitor BP and record when going from sitting to standing position.  Advised to discuss ongoing dizziness symptoms with primary care provider and provider BP readings. Advised to continue to use home humidifier due to wood burning.]  Pharmacy Interventions   Pharmacy Dicussed/Reviewed Pharmacy Topics Reviewed  Algis Downs to take medications / inhalers as prescribed.]              Our next appointment is by telephone on 12/08/22 at 10:00am   Please call the care guide team at [phone] if you need to cancel or reschedule your appointment.   If you are experiencing a Mental Health or Behavioral Health Crisis or need someone to talk to, please call the Suicide and Crisis Lifeline: 988 call 1-800-273-TALK (toll free, 24 hour hotline)  The patient verbalized understanding of instructions, educational materials, and care plan provided today and agreed to receive a mailed copy of patient instructions, educational materials, and care plan.   [name] RN,BSN,CCM [place]  [facility], [facility] coordinator / Case Manager Phone: [phone]

## 2023-10-15 NOTE — Patient Outreach (Signed)
  Care Coordination   Follow Up Visit Note   10/15/2023 Name: Abigail [name] MRN: [id number] DOB: 1951-02-26  Abigail [name] is a 72 y.o. year old female who sees Abigail [name], Abigail Mage, FNP for primary care. I spoke with  Abigail [name] by phone today.  What matters to the patients health and wellness today?  Patient states she is doing much better with her breathing on the Symbicort.  Patient states she tires easily.  Denies any increase in COPD symptoms.  Patient reports having to been seen in urgent care 09/2023 due to dizziness.  Patient reports being prescribed meclizine which seems to have helped.  Patient states she still occasionally has mild dizziness and reports noticing it more when she stands up to fast.     Goals Addressed             This Visit's Progress    Continued improvement post TAVR and management of health conditions.       Interventions Today    Flowsheet Row Most Recent Value  Chronic Disease   Chronic disease during today's visit Chronic Obstructive Pulmonary Disease (COPD), Other  [dizziness]  General Interventions   General Interventions Discussed/Reviewed General Interventions Reviewed, Doctor Visits  [evaluation of current treatment plan for COPD/ dizziness and patients adherence to plan as established by provider. Assessed for COPD and ongoing dizziness symptoms.]  Doctor Visits Discussed/Reviewed Doctor Visits Reviewed  Abigail [name] upcoming provider visits.]  Education Interventions   Education Provided Provided Education  [Patient advised to monitor BP and record when going from sitting to standing position.  Advised to discuss ongoing dizziness symptoms with primary care provider and provider BP readings. Advised to continue to use home humidifier due to wood burning.]  Pharmacy Interventions   Pharmacy Dicussed/Reviewed Pharmacy Topics Reviewed  [advised to take medications / inhalers as prescribed.]              SDOH assessments and interventions  completed:  No     Care Coordination Interventions:  Yes, provided   Follow up plan: Follow up call scheduled for 12/08/22     Encounter Outcome:  Patient Visit Completed   Abigail Ina RN,BSN,CCM [facility]  [facility], [facility] coordinator / Case Manager Phone: [phone]

## 2023-11-03 ENCOUNTER — Telehealth: Payer: Self-pay | Admitting: *Deleted

## 2023-11-03 ENCOUNTER — Other Ambulatory Visit: Payer: Self-pay | Admitting: *Deleted

## 2023-11-03 DIAGNOSIS — E1149 Type 2 diabetes mellitus with other diabetic neurological complication: Secondary | ICD-10-CM

## 2023-11-03 MED ORDER — OZEMPIC (2 MG/DOSE) 8 MG/3ML ~~LOC~~ SOPN: PEN_INJECTOR | SUBCUTANEOUS | 0 refills | Status: DC

## 2023-11-03 NOTE — Telephone Encounter (Signed)
Copied from CRM [id number]. Topic: Clinical - Medication Refill >> Nov 03, 2023  1:09 PM Almira Coaster wrote: Most Recent Primary Care Visit:  Provider: Mort Sawyers  Department: [facility]  Visit Type: OFFICE VISIT  Date: 08/05/2023  Medication: ozempic  Has the patient contacted their pharmacy? Yes, they recommended that patient call the pcp office as they don't have a refill for her.  (Agent: If no, request that the patient contact the pharmacy for the refill. If patient does not wish to contact the pharmacy document the reason why and proceed with request.) (Agent: If yes, when and what did the pharmacy advise?)  Is this the correct pharmacy for this prescription? Yes If no, delete pharmacy and type the correct one.  This is the patient's preferred pharmacy:  [facility] [phone] - [place], [place] - [address] AT [facility] [address]  [place] [address] Phone: [phone] Fax: [phone]   Has the prescription been filled recently? No  Is the patient out of the medication? Yes  Has the patient been seen for an appointment in the last year OR does the patient have an upcoming appointment? Yes  Can we respond through MyChart? No  Agent: Please be advised that Rx refills may take up to 3 business days. We ask that you follow-up with your pharmacy.

## 2023-11-03 NOTE — Telephone Encounter (Signed)
Rx has already been sent in today.  °

## 2023-11-10 ENCOUNTER — Other Ambulatory Visit (HOSPITAL_COMMUNITY): Payer: Self-pay

## 2023-11-12 ENCOUNTER — Ambulatory Visit: Payer: Medicare Other | Admitting: Podiatry

## 2023-11-26 ENCOUNTER — Other Ambulatory Visit: Payer: Self-pay | Admitting: Family Medicine

## 2023-11-26 ENCOUNTER — Ambulatory Visit: Payer: Medicare Other | Admitting: Podiatry

## 2023-12-01 DIAGNOSIS — D2239 Melanocytic nevi of other parts of face: Secondary | ICD-10-CM | POA: Diagnosis not present

## 2023-12-01 DIAGNOSIS — C44629 Squamous cell carcinoma of skin of left upper limb, including shoulder: Secondary | ICD-10-CM | POA: Diagnosis not present

## 2023-12-01 DIAGNOSIS — L570 Actinic keratosis: Secondary | ICD-10-CM | POA: Diagnosis not present

## 2023-12-01 DIAGNOSIS — L821 Other seborrheic keratosis: Secondary | ICD-10-CM | POA: Diagnosis not present

## 2023-12-01 DIAGNOSIS — D1801 Hemangioma of skin and subcutaneous tissue: Secondary | ICD-10-CM | POA: Diagnosis not present

## 2023-12-01 DIAGNOSIS — Z85828 Personal history of other malignant neoplasm of skin: Secondary | ICD-10-CM | POA: Diagnosis not present

## 2023-12-01 DIAGNOSIS — L400 Psoriasis vulgaris: Secondary | ICD-10-CM | POA: Diagnosis not present

## 2023-12-01 DIAGNOSIS — L649 Androgenic alopecia, unspecified: Secondary | ICD-10-CM | POA: Diagnosis not present

## 2023-12-01 DIAGNOSIS — L814 Other melanin hyperpigmentation: Secondary | ICD-10-CM | POA: Diagnosis not present

## 2023-12-01 DIAGNOSIS — L820 Inflamed seborrheic keratosis: Secondary | ICD-10-CM | POA: Diagnosis not present

## 2023-12-07 NOTE — Progress Notes (Signed)
 noted

## 2023-12-09 ENCOUNTER — Telehealth: Payer: Self-pay | Admitting: Internal Medicine

## 2023-12-09 ENCOUNTER — Ambulatory Visit: Payer: Self-pay

## 2023-12-09 DIAGNOSIS — R0609 Other forms of dyspnea: Secondary | ICD-10-CM

## 2023-12-09 DIAGNOSIS — R0989 Other specified symptoms and signs involving the circulatory and respiratory systems: Secondary | ICD-10-CM

## 2023-12-09 MED ORDER — FUROSEMIDE 20 MG PO TABS: 20.0000 mg | ORAL_TABLET | Freq: Every day | ORAL | 3 refills | Status: DC

## 2023-12-09 NOTE — Patient Instructions (Addendum)
Visit Information  Thank you for taking time to visit with me today. Please don't hesitate to contact me if I can be of assistance to you.   Following are the goals we discussed today:   Goals Addressed             This Visit's Progress    Continued improvement post TAVR and management of health conditions.       Interventions Today    Flowsheet Row Most Recent Value  Chronic Disease   Chronic disease during today's visit Chronic Obstructive Pulmonary Disease (COPD), Other  [dizziness]  General Interventions   General Interventions Discussed/Reviewed General Interventions Reviewed, Doctor Visits  [evaluation of current treatment plan for listed health conditions and patients adherence to plan as established by provider.  Assessed for COPD and ongoing dizziness]  Doctor Visits Discussed/Reviewed Doctor Visits Reviewed  [name] upcoming provider visits. Advised to keep follow up visits with providers as recommended.]  Education Interventions   Education Provided Provided Education  [Reviewd COPD action plan. Advised to contact provider for increase in COPD symptoms and call 911 for severe symptoms. Advised to call pulmonary office to reschedule cancelled appointment.]  Pharmacy Interventions   Pharmacy Dicussed/Reviewed Pharmacy Topics Reviewed  [medications reviewed and compliance discussed. Advised patient to contact cardiology office to clarify how often she is to take her lasix.]  Safety Interventions   Safety Discussed/Reviewed Fall Risk  [Advised to notify provider for increase/ worsening dizziness.  Advised to stay hydrated. Advised to change positions slowly giving body time to adjust.]                  Our next appointment is by telephone on 01/22/24 at 10 am  Please call the care guide team at [phone] if you need to cancel or reschedule your appointment.   If you are experiencing a Mental Health or Behavioral Health Crisis or need someone to talk to, please  call the Suicide and Crisis Lifeline: 988 call 1-800-273-TALK (toll free, 24 hour hotline)  The patient verbalized understanding of instructions, educational materials, and care plan provided today and DECLINED offer to receive copy of patient instructions, educational materials, and care plan.   [name] RN, BSN, CCM [employer], Population Health Case Manager Phone: [phone]

## 2023-12-09 NOTE — Telephone Encounter (Signed)
Patient notified that medication is a daily medication. Pt had no questions or concerns at this time.

## 2023-12-09 NOTE — Telephone Encounter (Signed)
 Per last ov with provider:  1. Dyspnea on Exertion - Unclear etiology but she reports her shortness of breath has been occurring for over a year but this did improve with recent adjustments in her inhalers by Pulmonology. She appears euvolemic by examination today and BNP was normal at 74 when checked in 07/2023. Would continue current diuretic therapy with Lasix  20 mg daily and we reviewed that she can take an extra tablet if needed for acute weight gain or worsening edema.

## 2023-12-09 NOTE — Telephone Encounter (Signed)
 Pt c/o medication issue:  1. Name of Medication:   furosemide  (LASIX ) 20 MG tablet    2. How are you currently taking this medication (dosage and times per day)? As needed  3. Are you having a reaction (difficulty breathing--STAT)? No   4. What is your medication issue? Care Coordination nurse told her she was suppose to be taking it daily and to call and check with us  to clarify.

## 2023-12-09 NOTE — Patient Outreach (Signed)
  Care Coordination   Follow Up Visit Note   12/09/2023 Name: Abigail [name] MRN: [id number] DOB: 1951/02/27  Abigail [name] is a 73 y.o. year old female who sees Anderson, Ginger, FNP for primary care. I spoke with  Abigail LELON Plaza by phone today.  What matters to the patients health and wellness today?  Patient states she still has occasional shortness of breath and fatigue.  Patient states she feels fatigue is caused by recent flu like sickness within the past 2 weeks. She states she was sick with fever and aches that lasted about 7 days.  Patient reports she is feeling better.  Patient states she still has some occasional dizziness however not as bad as it was. She states she still uses meclizine  as needed and tries to be careful.  Patient states she is taking her lasix  as needed.  Per medication review and last cardiology note from 08/14/24 advisement is to take lasix  daily.  Patient states her lasix  medication bottle states take as needed. She states she is not sure if any provider told her to take the medication as needed.      Goals Addressed             This Visit's Progress    Continued improvement post TAVR and management of health conditions.       Interventions Today    Flowsheet Row Most Recent Value  Chronic Disease   Chronic disease during today's visit Chronic Obstructive Pulmonary Disease (COPD), Other  [dizziness]  General Interventions   General Interventions Discussed/Reviewed General Interventions Reviewed, Doctor Visits  [evaluation of current treatment plan for listed health conditions and patients adherence to plan as established by provider.  Assessed for COPD and ongoing dizziness]  Doctor Visits Discussed/Reviewed Doctor Visits Reviewed  bethann upcoming provider visits. Advised to keep follow up visits with providers as recommended.]  Education Interventions   Education Provided Provided Education  [Reviewd COPD action plan. Advised to contact provider for increase in  COPD symptoms and call 911 for severe symptoms. Advised to call pulmonary office to reschedule cancelled appointment.]  Pharmacy Interventions   Pharmacy Dicussed/Reviewed Pharmacy Topics Reviewed  [medications reviewed and compliance discussed. Advised patient to contact cardiology office to clarify how often she is to take her lasix .]  Safety Interventions   Safety Discussed/Reviewed Fall Risk  [Advised to notify provider for increase/ worsening dizziness.  Advised to stay hydrated. Advised to change positions slowly giving body time to adjust.]                  SDOH assessments and interventions completed:  No     Care Coordination Interventions:  Yes, provided   Follow up plan: Follow up call scheduled for 01/22/24 at 10 am    Encounter Outcome:  Patient Visit Completed   Cecile Gillispie RN, BSN, CCM [place]  [facility], Population Health Case Manager Phone: [phone]

## 2023-12-15 ENCOUNTER — Telehealth (HOSPITAL_COMMUNITY): Payer: Self-pay

## 2023-12-15 NOTE — Telephone Encounter (Signed)
 Pharmacy Patient Advocate Encounter   Received notification from CoverMyMeds that prior authorization for Repatha  Surelcick 140 mg is required/requested.   Insurance verification completed.   The patient is insured through [facility] .   Per test claim: PA required; PA started via CoverMyMeds. KEY [id number] . Waiting for clinical questions to populate.

## 2023-12-18 ENCOUNTER — Other Ambulatory Visit (HOSPITAL_COMMUNITY): Payer: Self-pay

## 2023-12-18 NOTE — Telephone Encounter (Signed)
Copied from CRM [id number]. Topic: Clinical - Prescription Issue >> Dec 18, 2023  1:51 PM [place] E wrote: Reason for CRM: Brayton Caves from [employer] called stating that the prior authorization for patient's Repatha Surelcick 140 mg is being rejected. Callback number for Brayton Caves is [phone] to relay information as to why this medication is being rejected.

## 2023-12-19 ENCOUNTER — Telehealth: Payer: Self-pay

## 2023-12-19 ENCOUNTER — Other Ambulatory Visit (HOSPITAL_COMMUNITY): Payer: Self-pay

## 2023-12-19 NOTE — Telephone Encounter (Addendum)
PA request expired.   PA request has been  RESUBMITTED . New Encounter created for follow up. For additional info see Pharmacy Prior Auth telephone encounter from 12/19/23.

## 2023-12-19 NOTE — Telephone Encounter (Signed)
Pharmacy Patient Advocate Encounter   Received notification from Pt Calls Messages that prior authorization for Repatha SureClick 140MG /ML auto-injectors is required/requested.   Insurance verification completed.   The patient is insured through [facility] .   Per test claim: PA required; PA submitted to above mentioned insurance via CoverMyMeds Key/confirmation #/EOC [id number] Status is pending

## 2023-12-19 NOTE — Telephone Encounter (Signed)
Pharmacy Patient Advocate Encounter  Received notification from [facility] that Prior Authorization for Repatha SureClick 140MG /ML auto-injectors  has been APPROVED from 12/19/23 to 12/17/24. Ran test claim, Copay is $47. This test claim was processed through [facility]- copay amounts may vary at other pharmacies due to pharmacy/plan contracts, or as the patient moves through the different stages of their insurance plan.   PA #/Case ID/Reference #: [id number]

## 2023-12-24 ENCOUNTER — Ambulatory Visit: Payer: Medicare Other | Admitting: Podiatry

## 2023-12-25 ENCOUNTER — Ambulatory Visit: Payer: Medicare Other | Admitting: Internal Medicine

## 2024-01-13 ENCOUNTER — Encounter: Payer: Self-pay | Admitting: Family

## 2024-01-13 ENCOUNTER — Ambulatory Visit (INDEPENDENT_AMBULATORY_CARE_PROVIDER_SITE_OTHER): Payer: Medicare Other | Admitting: Family

## 2024-01-13 ENCOUNTER — Ambulatory Visit (INDEPENDENT_AMBULATORY_CARE_PROVIDER_SITE_OTHER)
Admission: RE | Admit: 2024-01-13 | Discharge: 2024-01-13 | Disposition: A | Discharge status: Home / Self Care | Source: Ambulatory Visit | Attending: Family | Admitting: Family

## 2024-01-13 VITALS — BP 120/78 | HR 57 | Temp 98.2°F | Ht 62.0 in | Wt 181.4 lb

## 2024-01-13 DIAGNOSIS — H93A2 Pulsatile tinnitus, left ear: Secondary | ICD-10-CM | POA: Insufficient documentation

## 2024-01-13 DIAGNOSIS — M5412 Radiculopathy, cervical region: Secondary | ICD-10-CM | POA: Diagnosis not present

## 2024-01-13 DIAGNOSIS — H9193 Unspecified hearing loss, bilateral: Secondary | ICD-10-CM | POA: Insufficient documentation

## 2024-01-13 DIAGNOSIS — G4719 Other hypersomnia: Secondary | ICD-10-CM | POA: Insufficient documentation

## 2024-01-13 DIAGNOSIS — R011 Cardiac murmur, unspecified: Secondary | ICD-10-CM

## 2024-01-13 DIAGNOSIS — E1149 Type 2 diabetes mellitus with other diabetic neurological complication: Secondary | ICD-10-CM

## 2024-01-13 DIAGNOSIS — Z01118 Encounter for examination of ears and hearing with other abnormal findings: Secondary | ICD-10-CM

## 2024-01-13 DIAGNOSIS — I10 Essential (primary) hypertension: Secondary | ICD-10-CM

## 2024-01-13 DIAGNOSIS — R0683 Snoring: Secondary | ICD-10-CM | POA: Diagnosis not present

## 2024-01-13 DIAGNOSIS — E7800 Pure hypercholesterolemia, unspecified: Secondary | ICD-10-CM

## 2024-01-13 LAB — COMPREHENSIVE METABOLIC PANEL
ALT: 19 U/L (ref 0–35)
AST: 21 U/L (ref 0–37)
Albumin: 4.4 g/dL (ref 3.5–5.2)
Alkaline Phosphatase: 99 U/L (ref 39–117)
BUN: 9 mg/dL (ref 6–23)
CO2: 31 meq/L (ref 19–32)
Calcium: 9.4 mg/dL (ref 8.4–10.5)
Chloride: 104 meq/L (ref 96–112)
Creatinine, Ser: 0.74 mg/dL (ref 0.40–1.20)
GFR: 79.61 mL/min (ref >60.00)
Glucose, Bld: 106 mg/dL — ABNORMAL HIGH (ref 70–99)
Potassium: 4 meq/L (ref 3.5–5.1)
Sodium: 143 meq/L (ref 135–145)
Total Bilirubin: 0.7 mg/dL (ref 0.2–1.2)
Total Protein: 6.7 g/dL (ref 6.0–8.3)

## 2024-01-13 LAB — CBC
HCT: 43.7 % (ref 36.0–46.0)
Hemoglobin: 14.7 g/dL (ref 12.0–15.0)
MCHC: 33.6 g/dL (ref 30.0–36.0)
MCV: 85.6 fl (ref 78.0–100.0)
Platelets: 189 10*3/uL (ref 150.0–400.0)
RBC: 5.1 Mil/uL (ref 3.87–5.11)
RDW: 13.6 % (ref 11.5–15.5)
WBC: 6.7 10*3/uL (ref 4.0–10.5)

## 2024-01-13 LAB — LIPID PANEL
Cholesterol: 126 mg/dL (ref 0–200)
HDL: 50 mg/dL (ref >39.00)
LDL Cholesterol: 42 mg/dL (ref 0–99)
NonHDL: 75.64
Total CHOL/HDL Ratio: 3
Triglycerides: 170 mg/dL — ABNORMAL HIGH (ref 0.0–149.0)
VLDL: 34 mg/dL (ref 0.0–40.0)

## 2024-01-13 LAB — HEMOGLOBIN A1C: Hgb A1c MFr Bld: 6 % (ref 4.6–6.5)

## 2024-01-13 LAB — VITAMIN B12: Vitamin B-12: 139 pg/mL — ABNORMAL LOW (ref 211–911)

## 2024-01-13 LAB — MICROALBUMIN / CREATININE URINE RATIO
Creatinine,U: 22.2 mg/dL
Microalb Creat Ratio: 31.5 mg/g — ABNORMAL HIGH (ref 0.0–30.0)
Microalb, Ur: <0.7 mg/dL (ref 0.0–1.9)

## 2024-01-13 LAB — TSH: TSH: 0.53 u[IU]/mL (ref 0.35–5.50)

## 2024-01-13 NOTE — Progress Notes (Signed)
 Neck xray with arthritic changes but no acute findings.  She does also have osteopenia in her neck so it is important if not already to star taking daily vitamin d and calcium supplementation. If this radiation of pain is ongoing though we can consider physical therapy and or referral to spine doctor (likely either way she will need to complete physical therapy as this is typically needed prior to MRI can be ordered)

## 2024-01-13 NOTE — Patient Instructions (Signed)
  Talk to cardiology about your beta blocker which is metoprolol and talk to Dr. Al Corpus about gabapentin in terms of feeling fatigued.

## 2024-01-13 NOTE — Assessment & Plan Note (Signed)
 Ordered lipid panel, pending results. Work on low cholesterol diet and exercise as tolerated

## 2024-01-13 NOTE — Assessment & Plan Note (Signed)
 A1c today in office stable.  Work on diabetic diet and exercise as tolerated. Yearly foot exam, and annual eye exam.  Urine microalbumin ordered today pending results

## 2024-01-13 NOTE — Assessment & Plan Note (Signed)
 Reviewed carotid u/s unremarkable Can consider audiology, ent vs cardiology

## 2024-01-13 NOTE — Assessment & Plan Note (Signed)
 Ongoing.  Suspected muscle spasm  Chronic however, will order cervical spine xray R/o herniation/degeneration Could consider muscle relaxer Conservative measures discussed Neck exercises, tylenol, heat/ice

## 2024-01-13 NOTE — Progress Notes (Signed)
 Established Patient Office Visit  Subjective:      CC:  Chief Complaint  Patient presents with   Medical Management of Chronic Issues    HPI: Abigail [name] is a 73 y.o. female presenting on 01/13/2024 for Medical Management of Chronic Issues . C/o fatigue. Ongoing. She is on gabapentin, she is only taking 600 mg twice daily she did change this down from 1200 mg twice daily, and there has been slight improvement with her fatigue. She is also taking metoprolol 75 mg once daily. She has never had a sleep study. At night time wakes up 2-3 times a night to go the bathroom.   She does also notice she can at times while lying down hear /feel her heart beat in her left ear not every night but it can keep her up at night time. Carotid u/s 4/24 negative.   C/o radiation of pain down bil upper arms at intermittent times. She does report tightness/muscle in her posterior neck at times.  Xray cervical spine 2014 with mild inferior endplate spurring at C5.       Social history:  Relevant past medical, surgical, family and social history reviewed and updated as indicated. Interim medical history since our last visit reviewed.  Allergies and medications reviewed and updated.  DATA REVIEWED: CHART IN EPIC     ROS: Negative unless specifically indicated above in HPI.    Current Outpatient Medications:    albuterol (VENTOLIN HFA) 108 (90 Base) MCG/ACT inhaler, Inhale 2 puffs into the lungs every 4 (four) hours as needed for wheezing or shortness of breath., Disp: 1 each, Rfl: 1   amoxicillin (AMOXIL) 500 MG tablet, Take 4 tablets (2,000 mg total) by mouth as directed. 1 HOUR PRIOR TO DENTAL APPOINTMENTS, Disp: 12 tablet, Rfl: 6   aspirin 81 MG chewable tablet, Chew 1 tablet (81 mg total) by mouth daily., Disp: , Rfl:    Azelastine HCl 137 MCG/SPRAY SOLN, Place 1 spray into both nostrils daily as needed (allergies/sinus issues.)., Disp: , Rfl:    budesonide-formoterol (SYMBICORT) 80-4.5  MCG/ACT inhaler, Inhale 2 puffs into the lungs in the morning and at bedtime., Disp: 1 each, Rfl: 12   COSENTYX SENSOREADY, 300 MG, 150 MG/ML SOAJ, Inject 300 mg into the skin every 30 (thirty) days., Disp: , Rfl:    DULoxetine (CYMBALTA) 30 MG capsule, Take 1 capsule (30 mg total) by mouth at bedtime., Disp: 30 capsule, Rfl: 3   Evolocumab (REPATHA SURECLICK) 140 MG/ML SOAJ, Inject 140 mg Abigail [name] every two weeks, Disp: 2 mL, Rfl: 5   fluticasone (FLONASE) 50 MCG/ACT nasal spray, Place 1 spray into both nostrils 2 (two) times daily., Disp: 16 g, Rfl: 2   furosemide (LASIX) 20 MG tablet, Take 1 tablet (20 mg total) by mouth daily. Can take additional tablet as needed for fluid/edema, Disp: 30 tablet, Rfl: 3   gabapentin (NEURONTIN) 600 MG tablet, TAKE 2 TABLETS BY MOUTH IN THE MORNING AND AT NIGHT, Disp: 360 tablet, Rfl: 2   glucose blood (FREESTYLE LITE) test strip, Use as instructed to monitor FSBS 1x daily. Dx: E11.9, Disp: 100 each, Rfl: 1   glucose monitoring kit (FREESTYLE) monitoring kit, 1 each by Does not apply route as needed for other. Dispense one glucometer of choice, testing strips/lancets for once per day glucose checks.  QS for 1 month, 11 refills., Disp: 1 each, Rfl: 0   Lancets (FREESTYLE) lancets, Use as instructed, Disp: 100 each, Rfl: 12   lisinopril (ZESTRIL) 10  MG tablet, Take 10 mg by mouth every evening., Disp: , Rfl:    loratadine (CLARITIN) 10 MG tablet, Take 1 tablet (10 mg total) by mouth daily., Disp: 30 tablet, Rfl: 11   meclizine (ANTIVERT) 12.5 MG tablet, Take 1 tablet (12.5 mg total) by mouth 3 (three) times daily as needed for dizziness., Disp: 30 tablet, Rfl: 0   metoprolol succinate (TOPROL-XL) 50 MG 24 hr tablet, Take 1.5 tablets (75 mg total) by mouth daily., Disp: 135 tablet, Rfl: 3   mometasone (NASONEX) 50 MCG/ACT nasal spray, Place 2 sprays into the nose daily as needed (allergies)., Disp: , Rfl:    Semaglutide, 2 MG/DOSE, (OZEMPIC, 2 MG/DOSE,) 8 MG/3ML SOPN,  Inject 2mg  into the skin once a week, Disp: 9 mL, Rfl: 0   diltiazem (CARDIZEM CD) 240 MG 24 hr capsule, Take 1 capsule (240 mg total) by mouth daily., Disp: 90 capsule, Rfl: 3      Objective:    BP 120/78 (BP Location: Left Arm, Patient Position: Sitting, Cuff Size: Normal)   Pulse (!) 57   Temp 98.2 F (36.8 C) (Temporal)   Ht 5\' 2"  (1.575 m)   Wt 181 lb 6.4 oz (82.3 kg)   SpO2 95%   BMI 33.18 kg/m   Wt Readings from Last 3 Encounters:  01/13/24 181 lb 6.4 oz (82.3 kg)  08/15/23 189 lb (85.7 kg)  08/06/23 186 lb 12.8 oz (84.7 kg)    Physical Exam Constitutional:      General: She is not in acute distress.    Appearance: Normal appearance. She is normal weight. She is not ill-appearing, toxic-appearing or diaphoretic.  HENT:     Head: Normocephalic.     Ears:     Comments: Bil ears with slight clear bulging with white coloring within TM  Left ear with ear wax blocking some visualization of TM however what I do see if questionable foreign body vs bulging tm white discoloration No erythema  Cardiovascular:     Rate and Rhythm: Normal rate.  Pulmonary:     Effort: Pulmonary effort is normal.  Musculoskeletal:        General: Normal range of motion.  Neurological:     General: No focal deficit present.     Mental Status: She is alert and oriented to person, place, and time. Mental status is at baseline.  Psychiatric:        Mood and Affect: Mood normal.        Behavior: Behavior normal.        Thought Content: Thought content normal.        Judgment: Judgment normal.           Assessment & Plan:  Excessive daytime sleepiness Assessment & Plan: D/w her possibility of OSA  Sending in order for sleep study Ordering b12 cbc and tsh  R/o thyroid disease, anemia, and b12 def  Orders: -     Ambulatory referral to Sleep Studies -     CBC -     Vitamin B12 -     TSH  Snoring -     Ambulatory referral to Sleep Studies  Essential hypertension -      TSH  Type 2 diabetes mellitus with neurological complications (HCC) Assessment & Plan: A1c today in office stable.  Work on diabetic diet and exercise as tolerated. Yearly foot exam, and annual eye exam.  Urine microalbumin ordered today pending results  Orders: -     Comprehensive metabolic panel -  Hemoglobin A1c -     Microalbumin / creatinine urine ratio  Heart murmur  Hypercholesterolemia with LDL greater than 190 mg/dL Assessment & Plan: Ordered lipid panel, pending results. Work on low cholesterol diet and exercise as tolerated   Orders: -     Lipid panel  Cervical radiculopathy Assessment & Plan: Ongoing.  Suspected muscle spasm  Chronic however, will order cervical spine xray R/o herniation/degeneration Could consider muscle relaxer Conservative measures discussed Neck exercises, tylenol, heat/ice  Orders: -     DG Cervical Spine Complete; Future  Pulsatile tinnitus of left ear Assessment & Plan: Reviewed carotid u/s unremarkable Can consider audiology, ent vs cardiology   Orders: -     Ambulatory referral to ENT  Abnormal ear exam -     Ambulatory referral to ENT  Bilateral hearing loss, unspecified hearing loss type -     Ambulatory referral to ENT     Return in about 6 months (around 07/15/2024) for f/u diabetes.  Mort Sawyers, MSN, APRN, FNP-C [place] [facility]

## 2024-01-13 NOTE — Assessment & Plan Note (Addendum)
 D/w her possibility of OSA  Sending in order for sleep study Ordering b12 cbc and tsh  R/o thyroid disease, anemia, and b12 def

## 2024-01-13 NOTE — Progress Notes (Signed)
 Vitamin B12 very low, please set up for B12 injections, 1000 mcg IM once monthly for three months. . Recommend also oral otc 1000 mcg once daily vitamin B12. B12 can cause a lot of fatigue!  Cholesterol looks great.  Urine microalbumin is a low positive. Pt is on lisinopril which is how you protect the kidneys from further stress/da mange. This is what you do when we see microalbumin in the urine.    A1c is stable continue with diabetic diet.  Thyroid normal range.

## 2024-01-14 ENCOUNTER — Ambulatory Visit

## 2024-01-14 ENCOUNTER — Telehealth: Payer: Self-pay | Admitting: Family

## 2024-01-14 ENCOUNTER — Encounter: Payer: Self-pay | Admitting: Podiatry

## 2024-01-14 ENCOUNTER — Ambulatory Visit (INDEPENDENT_AMBULATORY_CARE_PROVIDER_SITE_OTHER): Admitting: Podiatry

## 2024-01-14 DIAGNOSIS — G5792 Unspecified mononeuropathy of left lower limb: Secondary | ICD-10-CM | POA: Diagnosis not present

## 2024-01-14 DIAGNOSIS — E538 Deficiency of other specified B group vitamins: Secondary | ICD-10-CM | POA: Diagnosis not present

## 2024-01-14 DIAGNOSIS — G5791 Unspecified mononeuropathy of right lower limb: Secondary | ICD-10-CM | POA: Diagnosis not present

## 2024-01-14 MED ORDER — TRIAMCINOLONE ACETONIDE 40 MG/ML IJ SUSP
40.0000 mg | Freq: Once | INTRAMUSCULAR | Status: AC
Start: 2024-01-14 — End: 2024-01-14
  Administered 2024-01-14: 40 mg

## 2024-01-14 MED ORDER — CYANOCOBALAMIN 1000 MCG/ML IJ SOLN
1000.0000 ug | Freq: Once | INTRAMUSCULAR | Status: AC
Start: 2024-01-14 — End: 2024-01-14
  Administered 2024-01-14: 1000 ug via INTRAMUSCULAR

## 2024-01-14 NOTE — Progress Notes (Signed)
 She presents today for follow-up of her capsulitis bilaterally she states probably need shots since I usually come every 4 months and has been since May of last year.  She states that the pain has increased as well as some of the numbness and tingling that she has developed.  She does relate coming down off of her gabapentin from 600 mg in the morning and 600 at night to 300 in the morning and 300 at night with no side effects or complications.  She states that she really cannot tell any difference in those dosages when related to her pain.  Objective: Vital signs are stable alert oriented x 3.  Pulses are palpable.  There is no erythema edema salines drainage odor she has palpable nonpulsatile masses on the dorsal aspect of the foot at the tarsometatarsal joints which is consistent with osteoarthritis most likely entrapping the peroneal nerve and intermediate dorsal continuous nerve medial dorsal cutaneous nerve.  Assessment: Capsulitis osteoarthritis neuropathy neuritis.  Plan: Injected the dorsal aspect of the foot 20 mg Kenalog 5 mg Marcaine along the dorsal aspect of the bilateral foot.  I will follow-up with her in 4 months if necessary.

## 2024-01-14 NOTE — Progress Notes (Signed)
 Per orders of Mordecai Maes, NP , injection of B-12 given by Leonor Liv in right deltoid. Patient tolerated injection well.

## 2024-01-14 NOTE — Telephone Encounter (Signed)
 Spoke with pt. She had several questions about her appointment with Tabitha yesterday. Pt wanted to know about her home sleep study and ENT referral. I have answered her questions to the best of my ability. Nothing further was needed.

## 2024-01-14 NOTE — Telephone Encounter (Signed)
 Copied from CRM [id number]. Topic: Clinical - Medical Advice >> Jan 14, 2024  8:55 AM Isabell A wrote: Reason for CRM: Patient seen yesterday, she is requesting to speak to Tabitha's nurse in regard to questions she has.

## 2024-01-16 ENCOUNTER — Other Ambulatory Visit: Payer: Self-pay | Admitting: Family

## 2024-01-16 DIAGNOSIS — E782 Mixed hyperlipidemia: Secondary | ICD-10-CM

## 2024-01-16 DIAGNOSIS — Z789 Other specified health status: Secondary | ICD-10-CM

## 2024-01-16 DIAGNOSIS — G620 Drug-induced polyneuropathy: Secondary | ICD-10-CM

## 2024-01-16 MED ORDER — REPATHA SURECLICK 140 MG/ML ~~LOC~~ SOAJ: SUBCUTANEOUS | 5 refills | Status: DC

## 2024-01-21 ENCOUNTER — Ambulatory Visit: Payer: Medicare Other | Admitting: Podiatry

## 2024-01-22 ENCOUNTER — Ambulatory Visit: Payer: Self-pay

## 2024-01-22 NOTE — Patient Instructions (Signed)
 Visit Information  Thank you for taking time to visit with me today. Your case management goals have been met.   What we discussed today:   Goals Addressed             This Visit's Progress    Continued improvement post TAVR and management of health conditions.       Interventions Today    Flowsheet Row Most Recent Value  Chronic Disease   Chronic disease during today's visit Chronic Obstructive Pulmonary Disease (COPD), Other  [dizziness, daytime sleepiness]  General Interventions   General Interventions Discussed/Reviewed General Interventions Reviewed, Doctor Visits, Labs  [place] of current treatment plan for listed health conditions and patients adherence to plan as established by provider.  Assessed for COPD symptoms and ongoing dizziness.]  Labs --  [Reviewed / discussed recent B12 and tsh results.]  Doctor Visits Discussed/Reviewed Doctor Visits Reviewed  [facility] upcoming provider visit. Advised to keep follow up visits with providers as recommended.]  Exercise Interventions   Exercise Discussed/Reviewed Physical Activity  [Assessed patients physical activity. Encouraged increase in activity as tolerated.]  Education Interventions   Education Provided Provided Education  [Reviewed COPD signs/ symptoms. Reviewed COPD action plan. Advised to notify provider of early onset of increase in COPD symptoms. Advised to call 911 for severe symptoms especially effecting breathing.]  Pharmacy Interventions   Pharmacy Dicussed/Reviewed Pharmacy Topics Reviewed  [medications reviewed and adherence discussed and advised.]              Please contact your primary care provider if case management services are needed in the future.   If you are experiencing a Mental Health or Behavioral Health Crisis or need someone to talk to, please call the Suicide and Crisis Lifeline: 988 call 1-800-273-TALK (toll free, 24 hour hotline)  The patient verbalized understanding of instructions,  educational materials, and care plan provided today and agreed to receive a mailed copy of patient instructions, educational materials, and care plan.   [name] RN, BSN, CCM [employer], Population Health Case Manager Phone: [phone]

## 2024-01-22 NOTE — Patient Outreach (Signed)
 Care Coordination   Follow Up Visit Note   01/22/2024 Name: Abigail [name] MRN: [id number] DOB: 1950/12/07  Abigail [name] is a 73 y.o. year old female who sees Abigail [name], Abigail Mage, FNP for primary care. I spoke with  Abigail [name] by phone today.  What matters to the patients health and wellness today?  Patient reports having daytime sleepiness. She reports seeing her primary care provider on 01/13/24 and had lab work completed.  Patient states her B12 level was low and she has since started on B12 injections.  She states she has been a little more active since receiving the shots and has noticed some decrease in the daytime sleepiness.  Patient states she is also being referred for a sleep study.  Patient states she has some mild SOB however better than it was. Patient denies increase in COPD symptoms.  She states she continues to have occasional dizziness which is managed with meclizine. Patient denies any further needs or concerns and is agreeable that her care coordination/ case management goals have been met.    Goals Addressed             This Visit's Progress    Continued improvement post TAVR and management of health conditions.       Interventions Today    Flowsheet Row Most Recent Value  Chronic Disease   Chronic disease during today's visit Chronic Obstructive Pulmonary Disease (COPD), Other  [dizziness, daytime sleepiness]  General Interventions   General Interventions Discussed/Reviewed General Interventions Reviewed, Doctor Visits, Labs  [place] of current treatment plan for listed health conditions and patients adherence to plan as established by provider.  Assessed for COPD symptoms and ongoing dizziness.]  Labs --  [Reviewed / discussed recent B12 and tsh results.]  Doctor Visits Discussed/Reviewed Doctor Visits Reviewed  [name] upcoming provider visit. Advised to keep follow up visits with providers as recommended.]  Exercise Interventions   Exercise Discussed/Reviewed  Physical Activity  [Assessed patients physical activity. Encouraged increase in activity as tolerated.]  Education Interventions   Education Provided Provided Education  [Reviewed COPD signs/ symptoms. Reviewed COPD action plan. Advised to notify provider of early onset of increase in COPD symptoms. Advised to call 911 for severe symptoms especially effecting breathing.]  Pharmacy Interventions   Pharmacy Dicussed/Reviewed Pharmacy Topics Reviewed  [medications reviewed and adherence discussed and advised.]              SDOH assessments and interventions completed:  No     Care Coordination Interventions:  Yes, provided   Follow up plan: No further intervention required.   Encounter Outcome:  Patient Visit Completed   [name] RN, BSN, CCM [place]  [facility], Population Health Case Manager Phone: [phone]

## 2024-02-05 ENCOUNTER — Other Ambulatory Visit: Payer: Self-pay | Admitting: Family

## 2024-02-05 DIAGNOSIS — E1149 Type 2 diabetes mellitus with other diabetic neurological complication: Secondary | ICD-10-CM

## 2024-02-05 MED ORDER — OZEMPIC (2 MG/DOSE) 8 MG/3ML ~~LOC~~ SOPN
PEN_INJECTOR | SUBCUTANEOUS | 0 refills | Status: DC
Start: 2024-02-05 — End: 2024-05-17

## 2024-02-06 ENCOUNTER — Other Ambulatory Visit: Payer: Self-pay | Admitting: Internal Medicine

## 2024-02-06 DIAGNOSIS — R0989 Other specified symptoms and signs involving the circulatory and respiratory systems: Secondary | ICD-10-CM

## 2024-02-06 DIAGNOSIS — R0609 Other forms of dyspnea: Secondary | ICD-10-CM

## 2024-02-08 NOTE — Progress Notes (Unsigned)
 [facility]   MULTIDISCIPLINARY HEART VALVE CLINIC                                     Cardiology Office Note:    Date:  02/09/2024   ID:  Abigail [name], DOB [birth year], MRN [id number]  PCP:  Mort Sawyers, FNP  [facility] Cardiologist:  Marjo Bicker, MD  [facility] Structural heart: Tonny Bollman, MD [facility] Electrophysiologist:  None   Referring MD: Mort Sawyers, FNP   1 year s/p TAVR  History of Present Illness:    Abigail [name] is a 73 y.o. female with a hx of HTN, HLD, DM2, COPD, LVH, HFpEF and severe aortic stenosis s/p TAVR (12/07/22) who presents for evaluation of SOB.   Ms. Bagg was referred to Dr. Jenene Slicker by PCP for worsening SOB and DOE and abnormal echo. Echocardiogram 12/09/22 showed LVEF 65 to 70%, severe asymmetric basal septal hypertrophy measuring 1.9 cm, mild SAM of the anterior MV leaflet, dynamic subvalvular gradient, peak gradient at least 25 mmHg, G1 DD, normal RV systolic function, severely dilated LA, mild mitral valve stenosis, severe stenosis of aortic valve with mean gradient 51 mmHg, peak gradient 88.6 mmHg, valve area 0.73 cm (aortic valve area by VTI was 1.04 cm and mean gradient 24 mmHg on 04/2021 study). R/LHC showed nonobstructive CAD with no flow-limiting coronary lesions. S/p TAVR with a 23 mm Edwards Sapien 3 THV via the TF approach on 02/05/23. Post operative echo showed a mean gradient at . She continued to have some dyspnea and Diltiazem was added. Given elevated RAISE score, she underwent PYP scanning/labs which were negative for amyloid. Follow up cardiac MRI showed no evidence of cardiac amyloidosis and she did have asymmetric LV hypertrophy but did not meet criteria for hypertrophic cardiomyopathy. EF was estimated at 73% with  RV EF at 63%. Was also evaluated by Pulmonology in the interim with plans for allergen panel and PFT's, but it was never scheduled.    Today the patient presents to clinic for follow  up. Here with her husband. No CP. Has some mild SOB but actually this has improved. No LE edema, orthopnea or PND. Occasional dizziness with positional changes but no syncope. No blood in stool or urine. No palpitations.    Past Medical History:  Diagnosis Date   Allergy    Anxiety    Arthritis    hands, knees   Cancer (HCC)    Cataract    surgery to remove -bilateral   COPD (chronic obstructive pulmonary disease) (HCC)    Depression    Diabetes mellitus (HCC)    a. A1c 6.6 in 05/2013 indicating new dx.   Fatty liver    Female stress incontinence    GERD (gastroesophageal reflux disease)    Heart murmur    no problems   Hemorrhoids    History of kidney stones    surgery to remove   Hyperlipidemia    Hyperplastic colon polyp 06/07/2008   Hypertension    Hypertriglyceridemia    IBS (irritable bowel syndrome)    Lung nodule    a. 4mm left lung nodule by CT 05/2013.   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 Excell Seltzer and Dr. Leafy Ro  SCCA (squamous cell carcinoma) of skin 01/16/2022   Left Malar Cheek (in situ) (tx p bx)   Skin cancer    Squamous cell carcinoma of skin 01/16/2022   Right Breast (in situ) (curet and 5FU)     Current Medications: Current Meds  Medication Sig   albuterol (VENTOLIN HFA) 108 (90 Base) MCG/ACT inhaler Inhale 2 puffs into the lungs every 4 (four) hours as needed for wheezing or shortness of breath.   amoxicillin (AMOXIL) 500 MG tablet Take 4 tablets (2,000 mg total) by mouth as directed. 1 HOUR PRIOR TO DENTAL APPOINTMENTS   aspirin 81 MG chewable tablet Chew 1 tablet (81 mg total) by mouth daily.   Azelastine HCl 137 MCG/SPRAY SOLN Place 1 spray into both nostrils daily as needed (allergies/sinus issues.).   budesonide-formoterol (SYMBICORT) 80-4.5 MCG/ACT inhaler Inhale 2 puffs into the lungs in the morning and at  bedtime.   COSENTYX SENSOREADY, 300 MG, 150 MG/ML SOAJ Inject 300 mg into the skin every 30 (thirty) days.   DULoxetine (CYMBALTA) 30 MG capsule Take 1 capsule (30 mg total) by mouth at bedtime.   Evolocumab (REPATHA SURECLICK) 140 MG/ML SOAJ Inject 140 mg [place] every two weeks   fluticasone (FLONASE) 50 MCG/ACT nasal spray Place 1 spray into both nostrils 2 (two) times daily.   furosemide (LASIX) 20 MG tablet TAKE 1 TABLET(20 MG) BY MOUTH DAILY AS NEEDED   gabapentin (NEURONTIN) 600 MG tablet TAKE 2 TABLETS BY MOUTH IN THE MORNING AND AT NIGHT   glucose blood (FREESTYLE LITE) test strip Use as instructed to monitor FSBS 1x daily. Dx: E11.9   glucose monitoring kit (FREESTYLE) monitoring kit 1 each by Does not apply route as needed for other. Dispense one glucometer of choice, testing strips/lancets for once per day glucose checks.  QS for 1 month, 11 refills.   Lancets (FREESTYLE) lancets Use as instructed   lisinopril (ZESTRIL) 10 MG tablet Take 10 mg by mouth every evening.   loratadine (CLARITIN) 10 MG tablet Take 1 tablet (10 mg total) by mouth daily.   meclizine (ANTIVERT) 12.5 MG tablet Take 1 tablet (12.5 mg total) by mouth 3 (three) times daily as needed for dizziness.   metoprolol succinate (TOPROL-XL) 50 MG 24 hr tablet Take 1.5 tablets (75 mg total) by mouth daily.   mometasone (NASONEX) 50 MCG/ACT nasal spray Place 2 sprays into the nose daily as needed (allergies).   Semaglutide, 2 MG/DOSE, (OZEMPIC, 2 MG/DOSE,) 8 MG/3ML SOPN Inject 2mg  into the skin once a week      ROS:   Please see the history of present illness.    All other systems reviewed and are negative.  EKGs       Risk Assessment/Calculations:            Physical Exam:    VS:  BP (!) 118/52   Pulse (!) 53   Ht 5\' 2"  (1.575 m)   Wt 170 lb (77.1 kg)   SpO2 94%   BMI 31.09 kg/m     Wt Readings from Last 3 Encounters:  02/09/24 170 lb (77.1 kg)  01/13/24 181 lb 6.4 oz (82.3 kg)  08/15/23 189 lb (85.7  kg)     GEN: Well nourished, well developed in no acute distress NECK: No JVD CARDIAC: RRR, soft flow murmur.  No rubs, gallops RESPIRATORY:  Clear to auscultation without rales, wheezing or rhonchi  ABDOMEN: Soft, non-tender, non-distended EXTREMITIES:  No edema; No deformity.  ASSESSMENT:    1. S/P  TAVR (transcatheter aortic valve replacement)   2. LVH (left ventricular hypertrophy)   3. Chronic heart failure with preserved ejection fraction (HCC)   4. Mitral annular calcification     PLAN:    In order of problems listed above:  Severe AS s/p TAVR:  -- Echo today shows EF 55%, mild LVH, normally functioning TAVR with a mean gradient of 11 mm hg and no PVL.  -- NYHA class I symptoms with marked improvement in SOB.  -- Continue Aspirin 81mg  daily.  -- She requires lifelong SBE prophylaxis. She has amoxicillin.  -- Continue regular follow up with Dr. Elson Areas   HFpEF:  -- Appreas euvolemic.  -- Continue Lasix 20mg  PRN.    LVH without HOCM:  -- cMRI did not meet criteria for HOCM  as LVH <67mm.  -- Continue Cardizem CD 240mg  daily and Toprol XL 75mg  daily.  -- LVH mild by echo today.    Moderate MAC: -- Noted on echo with mild MR.  Medication Adjustments/Labs and Tests Ordered: Current medicines are reviewed at length with the patient today.  Concerns regarding medicines are outlined above.  No orders of the defined types were placed in this encounter.  No orders of the defined types were placed in this encounter.   Patient Instructions  Medication Instructions:  Your physician recommends that you continue on your current medications as directed. Please refer to the Current Medication list given to you today.  *If you need a refill on your cardiac medications before your next appointment, please call your pharmacy*  Lab Work: NONE If you have labs (blood work) drawn today and your tests are completely normal, you will receive your results only by: MyChart  Message (if you have MyChart) OR A paper copy in the mail If you have any lab test that is abnormal or we need to change your treatment, we will call you to review the results.  Testing/Procedures: NONE  Follow-Up: At [facility], you and your health needs are our priority.  As part of our continuing mission to provide you with exceptional heart care, our providers are all part of one team.  This team includes your primary Cardiologist (physician) and Advanced Practice Providers or APPs (Physician Assistants and Nurse Practitioners) who all work together to provide you with the care you need, when you need it.  Your next appointment:   KEEP SCHEDULED FOLLOW-UP  We recommend signing up for the patient portal called "MyChart".  Sign up information is provided on this After Visit Summary.  MyChart is used to connect with patients for Virtual Visits (Telemedicine).  Patients are able to view lab/test results, encounter notes, upcoming appointments, etc.  Non-urgent messages can be sent to your provider as well.   To learn more about what you can do with MyChart, go to [email or web address].   Other Instructions       1st Floor: - Lobby - Registration  - Pharmacy  - Lab - Cafe  2nd Floor: - PV Lab - Diagnostic Testing (echo, CT, nuclear med)  3rd Floor: - Vacant  4th Floor: - TCTS (cardiothoracic surgery) - AFib Clinic - Structural Heart Clinic - Vascular Surgery  - Vascular Ultrasound  5th Floor: - HeartCare Cardiology (general and EP) - Clinical Pharmacy for coumadin, hypertension, lipid, weight-loss medications, and med management appointments    Valet parking services will be available as well.      Signed, Cline Crock, PA-C  02/09/2024 11:15 AM    [facility]

## 2024-02-09 ENCOUNTER — Ambulatory Visit (HOSPITAL_COMMUNITY): Payer: Medicare Other | Attending: Cardiovascular Disease

## 2024-02-09 ENCOUNTER — Ambulatory Visit (INDEPENDENT_AMBULATORY_CARE_PROVIDER_SITE_OTHER): Payer: Medicare Other | Admitting: Physician Assistant

## 2024-02-09 VITALS — BP 118/52 | HR 53 | Ht 62.0 in | Wt 170.0 lb

## 2024-02-09 DIAGNOSIS — Z952 Presence of prosthetic heart valve: Secondary | ICD-10-CM | POA: Diagnosis not present

## 2024-02-09 DIAGNOSIS — I350 Nonrheumatic aortic (valve) stenosis: Secondary | ICD-10-CM | POA: Insufficient documentation

## 2024-02-09 DIAGNOSIS — I517 Cardiomegaly: Secondary | ICD-10-CM

## 2024-02-09 DIAGNOSIS — I5032 Chronic diastolic (congestive) heart failure: Secondary | ICD-10-CM | POA: Insufficient documentation

## 2024-02-09 DIAGNOSIS — I3481 Nonrheumatic mitral (valve) annulus calcification: Secondary | ICD-10-CM | POA: Insufficient documentation

## 2024-02-09 LAB — ECHOCARDIOGRAM COMPLETE
AR max vel: 1.86 cm2
AV Area VTI: 2.22 cm2
AV Area mean vel: 2.24 cm2
AV Mean grad: 11 mmHg
AV Peak grad: 26 mmHg
Ao pk vel: 2.55 m/s
Area-P 1/2: 1.87 cm2
S' Lateral: 2.3 cm

## 2024-02-09 NOTE — Patient Instructions (Signed)
 Medication Instructions:  Your physician recommends that you continue on your current medications as directed. Please refer to the Current Medication list given to you today.  *If you need a refill on your cardiac medications before your next appointment, please call your pharmacy*  Lab Work: NONE  If you have labs (blood work) drawn today and your tests are completely normal, you will receive your results only by: MyChart Message (if you have MyChart) OR A paper copy in the mail If you have any lab test that is abnormal or we need to change your treatment, we will call you to review the results.  Testing/Procedures: NONE  Follow-Up: At [facility], you and your health needs are our priority.  As part of our continuing mission to provide you with exceptional heart care, our providers are all part of one team.  This team includes your primary Cardiologist (physician) and Advanced Practice Providers or APPs (Physician Assistants and Nurse Practitioners) who all work together to provide you with the care you need, when you need it.  Your next appointment:   KEEP SCHEDULED FOLLOW-UP  We recommend signing up for the patient portal called "MyChart".  Sign up information is provided on this After Visit Summary.  MyChart is used to connect with patients for Virtual Visits (Telemedicine).  Patients are able to view lab/test results, encounter notes, upcoming appointments, etc.  Non-urgent messages can be sent to your provider as well.   To learn more about what you can do with MyChart, go to [email or web address].   Other Instructions       1st Floor: - Lobby - Registration  - Pharmacy  - Lab - Cafe  2nd Floor: - PV Lab - Diagnostic Testing (echo, CT, nuclear med)  3rd Floor: - Vacant  4th Floor: - TCTS (cardiothoracic surgery) - AFib Clinic - Structural Heart Clinic - Vascular Surgery  - Vascular Ultrasound  5th Floor: - HeartCare Cardiology (general and EP) -  Clinical Pharmacy for coumadin, hypertension, lipid, weight-loss medications, and med management appointments    Valet parking services will be available as well.

## 2024-02-11 ENCOUNTER — Ambulatory Visit (INDEPENDENT_AMBULATORY_CARE_PROVIDER_SITE_OTHER): Payer: Medicare Other

## 2024-02-11 VITALS — Ht 62.0 in | Wt 170.0 lb

## 2024-02-11 DIAGNOSIS — Z0000 Encounter for general adult medical examination without abnormal findings: Secondary | ICD-10-CM

## 2024-02-11 NOTE — Patient Instructions (Signed)
 Ms. Abigail [name] , Thank you for taking time to come for your Medicare Wellness Visit. I appreciate your ongoing commitment to your health goals. Please review the following plan we discussed and let me know if I can assist you in the future.   Referrals/Orders/Follow-Ups/Clinician Recommendations: none  This is a list of the screening recommended for you and due dates:  Health Maintenance  Topic Date Due   DTaP/Tdap/Td vaccine (1 - Tdap) Never done   Zoster (Shingles) Vaccine (1 of 2) Never done   DEXA scan (bone density measurement)  01/23/2020   Mammogram  12/18/2022   Complete foot exam   05/02/2023   Colon Cancer Screening  06/06/2023   Eye exam for diabetics  07/18/2023   Medicare Annual Wellness Visit  02/10/2024   Flu Shot  06/04/2024   Hemoglobin A1C  07/15/2024   Yearly kidney function blood test for diabetes  01/12/2025   Yearly kidney health urinalysis for diabetes  01/12/2025   Pneumonia Vaccine  Completed   Hepatitis C Screening  Completed   HPV Vaccine  Aged Out   COVID-19 Vaccine  Discontinued    Advanced directives: (Copy Requested) Please bring a copy of your health care power of attorney and living will to the office to be added to your chart at your convenience. You can mail to [facility] [address] [place], [place] [address] or email to [email or web address]  Next Medicare Annual Wellness Visit scheduled for next year: Yes 02/11/24 @  8:10am televisit

## 2024-02-11 NOTE — Progress Notes (Signed)
 Subjective:   Abigail [name] is a 73 y.o. who presents for a Medicare Wellness preventive visit.  Visit Complete: Virtual I connected with  Abigail [name] on 02/11/24 by a audio enabled telemedicine application and verified that I am speaking with the correct person using two identifiers.  Patient Location: Home  Provider Location: Home Office  I discussed the limitations of evaluation and management by telemedicine. The patient expressed understanding and agreed to proceed.  Vital Signs: Because this visit was a virtual/telehealth visit, some criteria may be missing or patient reported. Any vitals not documented were not able to be obtained and vitals that have been documented are patient reported.  VideoDeclined- This patient declined Librarian, academic. Therefore the visit was completed with audio only.  Persons Participating in Visit: Patient.  AWV Questionnaire: No: Patient Medicare AWV questionnaire was not completed prior to this visit.  Cardiac Risk Factors include: advanced age (>63men, >78 women);diabetes mellitus;dyslipidemia;hypertension;obesity (BMI >30kg/m2)     Objective:    Today's Vitals   02/11/24 0811  Weight: 170 lb (77.1 kg)  Height: 5\' 2"  (1.575 m)  PainSc: 5    Body mass index is 31.09 kg/m.     02/11/2024    8:35 AM 02/10/2023    8:15 AM 02/05/2023    1:00 PM 01/08/2023    7:56 AM 02/05/2022    8:25 AM 02/06/2017   12:52 PM 06/15/2015    9:25 AM  Advanced Directives  Does Patient Have a Medical Advance Directive? Yes Yes Yes No No No   Type of Estate agent of [place];Living will Healthcare Power of [place];Living will Living will      Does patient want to make changes to medical advance directive?   No - Patient declined      Copy of Healthcare Power of Attorney in Chart? No - copy requested No - copy requested     No - copy requested  Would patient like information on creating a medical advance directive?     No - Patient declined;Yes (Inpatient - patient defers creating a medical advance directive at this time - Information given) No - Patient declined  Yes - Transport planner given;No - patient declined information    Current Medications (verified) Outpatient Encounter Medications as of 02/11/2024  Medication Sig   albuterol (VENTOLIN HFA) 108 (90 Base) MCG/ACT inhaler Inhale 2 puffs into the lungs every 4 (four) hours as needed for wheezing or shortness of breath.   amoxicillin (AMOXIL) 500 MG tablet Take 4 tablets (2,000 mg total) by mouth as directed. 1 HOUR PRIOR TO DENTAL APPOINTMENTS   aspirin 81 MG chewable tablet Chew 1 tablet (81 mg total) by mouth daily.   Azelastine HCl 137 MCG/SPRAY SOLN Place 1 spray into both nostrils daily as needed (allergies/sinus issues.).   budesonide-formoterol (SYMBICORT) 80-4.5 MCG/ACT inhaler Inhale 2 puffs into the lungs in the morning and at bedtime.   COSENTYX SENSOREADY, 300 MG, 150 MG/ML SOAJ Inject 300 mg into the skin every 30 (thirty) days.   diltiazem (CARDIZEM CD) 240 MG 24 hr capsule Take 1 capsule (240 mg total) by mouth daily.   DULoxetine (CYMBALTA) 30 MG capsule Take 1 capsule (30 mg total) by mouth at bedtime.   Evolocumab (REPATHA SURECLICK) 140 MG/ML SOAJ Inject 140 mg [place] every two weeks   fluticasone (FLONASE) 50 MCG/ACT nasal spray Place 1 spray into both nostrils 2 (two) times daily.   furosemide (LASIX) 20 MG tablet TAKE 1  TABLET(20 MG) BY MOUTH DAILY AS NEEDED   gabapentin (NEURONTIN) 600 MG tablet TAKE 2 TABLETS BY MOUTH IN THE MORNING AND AT NIGHT   glucose blood (FREESTYLE LITE) test strip Use as instructed to monitor FSBS 1x daily. Dx: E11.9   glucose monitoring kit (FREESTYLE) monitoring kit 1 each by Does not apply route as needed for other. Dispense one glucometer of choice, testing strips/lancets for once per day glucose checks.  QS for 1 month, 11 refills.   Lancets (FREESTYLE) lancets Use as instructed   lisinopril  (ZESTRIL) 10 MG tablet Take 10 mg by mouth every evening.   loratadine (CLARITIN) 10 MG tablet Take 1 tablet (10 mg total) by mouth daily.   meclizine (ANTIVERT) 12.5 MG tablet Take 1 tablet (12.5 mg total) by mouth 3 (three) times daily as needed for dizziness.   metoprolol succinate (TOPROL-XL) 50 MG 24 hr tablet Take 1.5 tablets (75 mg total) by mouth daily.   mometasone (NASONEX) 50 MCG/ACT nasal spray Place 2 sprays into the nose daily as needed (allergies).   Semaglutide, 2 MG/DOSE, (OZEMPIC, 2 MG/DOSE,) 8 MG/3ML SOPN Inject 2mg  into the skin once a week   No facility-administered encounter medications on file as of 02/11/2024.    Allergies (verified) Statins and Cinnamon   History: Past Medical History:  Diagnosis Date   Allergy    Anxiety    Arthritis    hands, knees   Cancer (HCC)    Cataract    surgery to remove -bilateral   COPD (chronic obstructive pulmonary disease) (HCC)    Depression    Diabetes mellitus (HCC)    a. A1c 6.6 in 05/2013 indicating new dx.   Fatty liver    Female stress incontinence    GERD (gastroesophageal reflux disease)    Heart murmur    no problems   Hemorrhoids    History of kidney stones    surgery to remove   Hyperlipidemia    Hyperplastic colon polyp 06/07/2008   Hypertension    Hypertriglyceridemia    IBS (irritable bowel syndrome)    Lung nodule    a. 4mm left lung nodule by CT 05/2013.   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 Excell Seltzer and Dr. Leafy Ro   SCCA (squamous cell carcinoma) of skin 01/16/2022   Left Malar Cheek (in situ) (tx p bx)   Skin cancer    Squamous cell carcinoma of skin 01/16/2022   Right Breast (in situ) (curet and 5FU)   Past Surgical History:  Procedure Laterality Date   CATARACT EXTRACTION W/PHACO Left 10/06/2014   Procedure: CATARACT EXTRACTION PHACO AND INTRAOCULAR LENS  PLACEMENT LEFT EYE;  Surgeon: Gemma Payor, MD;  Location: [facility];  Service: Ophthalmology;  Laterality: Left;  CDE 7.35   CATARACT EXTRACTION W/PHACO Right 11/14/2014   Procedure: CATARACT EXTRACTION PHACO AND INTRAOCULAR LENS PLACEMENT RIGHT EYE;  Surgeon: Gemma Payor, MD;  Location: [facility];  Service: Ophthalmology;  Laterality: Right;  CDE:6.39   COLONOSCOPY  06/2008   hx polyps   CYSTOSCOPY W/ RETROGRADES Right 01/21/2017   Procedure: CYSTOSCOPY WITH RETROGRADE PYELOGRAM;  Surgeon: Vanna Scotland, MD;  Location: [facility];  Service: Urology;  Laterality: Right;   CYSTOSCOPY/URETEROSCOPY/HOLMIUM LASER/STENT PLACEMENT Left 01/21/2017   Procedure: CYSTOSCOPY/URETEROSCOPY/HOLMIUM LASER/STENT PLACEMENT;  Surgeon: Vanna Scotland, MD;  Location: [facility];  Service: Urology;  Laterality:  Left;   DORSAL COMPARTMENT RELEASE Left 06/15/2015   Procedure: LEFT WRIST DEQUERVAINS;  Surgeon: Loreta Ave, MD;  Location: [facility];  Service: Orthopedics;  Laterality: Left;   HERNIA REPAIR     INTRAOPERATIVE TRANSTHORACIC ECHOCARDIOGRAM N/A 02/05/2023   Procedure: INTRAOPERATIVE TRANSTHORACIC ECHOCARDIOGRAM;  Surgeon: Tonny Bollman, MD;  Location: [facility] CV [facility];  Service: Open Heart Surgery;  Laterality: N/A;   MOUTH SURGERY     tooth ext   NASAL SINUS SURGERY     RIGHT/LEFT HEART CATH AND CORONARY ANGIOGRAPHY N/A 01/08/2023   Procedure: RIGHT/LEFT HEART CATH AND CORONARY ANGIOGRAPHY;  Surgeon: Tonny Bollman, MD;  Location: [facility] INVASIVE CV LAB;  Service: Cardiovascular;  Laterality: N/A;   SHOULDER SURGERY     right   TONSILLECTOMY     TRANSCATHETER AORTIC VALVE REPLACEMENT, TRANSFEMORAL Right 02/05/2023   Procedure: Transcatheter Aortic Valve Replacement, Transfemoral;  Surgeon: Tonny Bollman, MD;  [facility];  Service: Open Heart Surgery;  Laterality: Right;   UPPER GI ENDOSCOPY     normal per patient   VENTRAL HERNIA REPAIR     Family History  Problem Relation Age  of Onset   Breast cancer Mother    Colon polyps Maternal Aunt    Heart disease Maternal Grandfather    Kidney cancer Neg Hx    Bladder Cancer Neg Hx    Colon cancer Neg Hx    Stomach cancer Neg Hx    Rectal cancer Neg Hx    Pancreatic cancer Neg Hx    Esophageal cancer Neg Hx    Social History   Socioeconomic History   Marital status: Married    Spouse name: Lyda Jester   Number of children: 2   Years of education: trade school   Highest education level: Not on file  Occupational History   Occupation: retired  Tobacco Use   Smoking status: Never   Smokeless tobacco: Never  Vaping Use   Vaping status: Never Used  Substance and Sexual Activity   Alcohol use: No   Drug use: No   Sexual activity: Yes    Birth control/protection: Post-menopausal  Other Topics Concern   Not on file  Social History Narrative   01/24/22   From: the area   Living: with Lyda Jester, husband (1968)   Work: retired - school bus Printmaker      Family: 2 children - Letta Median and Hilda Lias - 4 grandchildren      Enjoys: farm life, play with grandchildren, travel      Exercise: stairs at home 20 times a day, walking the property   Diet: not following diabetic diet but trying to do better      Safety   Seat belts: Yes    Guns: Yes  and secure   Safe in relationships: Yes       Social Drivers of Health   Financial Resource Strain: Low Risk  (02/11/2024)   Overall Financial Resource Strain (CARDIA)    Difficulty of Paying Living Expenses: Not hard at all  Food Insecurity: No Food Insecurity (02/11/2024)   Hunger Vital Sign    Worried About Running Out of Food in the Last Year: Never true    Ran Out of Food in the Last Year: Never true  Transportation Needs: No Transportation Needs (02/11/2024)   PRAPARE - Administrator, Civil Service (Medical): No    Lack of Transportation (Non-Medical): No  Physical Activity: Insufficiently Active (02/11/2024)   Exercise Vital  Sign    Days of Exercise per Week:  5 days    Minutes of Exercise per Session: 20 min  Stress: No Stress Concern Present (02/11/2024)   [employer] of Occupational Health - Occupational Stress Questionnaire    Feeling of Stress : Not at all  Social Connections: Moderately Integrated (02/11/2024)   Social Connection and Isolation Panel [NHANES]    Frequency of Communication with Friends and Family: More than three times a week    Frequency of Social Gatherings with Friends and Family: More than three times a week    Attends Religious Services: More than 4 times per year    Active Member of [employer] or Organizations: No    Attends Engineer, structural: Never    Marital Status: Married    Tobacco Counseling Counseling given: Not Answered    Clinical Intake:  Pre-visit preparation completed: Yes  Pain : 0-10 Pain Score: 5  Pain Type: Chronic pain Pain Location: Back Pain Orientation: Lower Pain Descriptors / Indicators: Aching Pain Onset: More than a month ago Pain Frequency: Intermittent Pain Relieving Factors: rest, Tylenol arthritis  Pain Relieving Factors: rest, Tylenol arthritis  BMI - recorded: 31.09 Nutritional Status: BMI > 30  Obese Nutritional Risks: None Diabetes: Yes CBG done?: No Did pt. bring in CBG monitor from home?: No  Lab Results  Component Value Date   HGBA1C 6.0 01/13/2024   HGBA1C 6.0 (A) 06/16/2023   HGBA1C 6.0 (A) 11/08/2022     How often do you need to have someone help you when you read instructions, pamphlets, or other written materials from your doctor or pharmacy?: 1 - Never  Interpreter Needed?: No  Comments: lives with husband Information entered by :: B.Kameron Glazebrook,LPN   Activities of Daily Living     02/11/2024    8:37 AM 06/16/2023    8:19 AM  In your present state of health, do you have any difficulty performing the following activities:  Hearing? 1 0  Vision? 0 0  Difficulty concentrating or making decisions? 0 0  Walking or climbing stairs? 0 0   Dressing or bathing? 0 0  Doing errands, shopping? 0 0  Preparing Food and eating ? N   Using the Toilet? N   In the past six months, have you accidently leaked urine? N   Do you have problems with loss of bowel control? N   Managing your Medications? N   Managing your Finances? N   Housekeeping or managing your Housekeeping? N     Patient Care Team: Mort Sawyers, FNP as PCP - General (Family Medicine) Mallipeddi, Orion Modest, MD as PCP - Cardiology (Cardiology) Tonny Bollman, MD as PCP - Structural Heart (Cardiology) Glyn Ade, PA-C as Physician Assistant (Dermatology) [place], Annye Rusk, [place] as Consulting Physician (Podiatry) Napoleon Form, MD as Consulting Physician (Gastroenterology) Ewing Schlein, MD as Referring Physician (Pain Medicine)  Indicate any recent Medical Services you may have received from other than [facility] providers in the past year (date may be approximate).     Assessment:   This is a routine wellness examination for [facility].  Hearing/Vision screen Hearing Screening - Comments:: Pt says she has issues with hearing: has ENT appt next month:says muffled hearing and blurred words Vision Screening - Comments:: Pt says her vision is good;readers only Office manager   Goals Addressed             This Visit's Progress    Continued improvement post TAVR and management of health  conditions.   On track    Interventions Today    Flowsheet Row Most Recent Value  Chronic Disease   Chronic disease during today's visit Chronic Obstructive Pulmonary Disease (COPD), Other  [dizziness, daytime sleepiness]  General Interventions   General Interventions Discussed/Reviewed General Interventions Reviewed, Doctor Visits, Labs  [place] of current treatment plan for listed health conditions and patients adherence to plan as established by provider.  Assessed for COPD symptoms and ongoing dizziness.]  Labs --  [Reviewed / discussed recent B12 and tsh results.]   Doctor Visits Discussed/Reviewed Doctor Visits Reviewed  [facility] upcoming provider visit. Advised to keep follow up visits with providers as recommended.]  Exercise Interventions   Exercise Discussed/Reviewed Physical Activity  [Assessed patients physical activity. Encouraged increase in activity as tolerated.]  Education Interventions   Education Provided Provided Education  [Reviewed COPD signs/ symptoms. Reviewed COPD action plan. Advised to notify provider of early onset of increase in COPD symptoms. Advised to call 911 for severe symptoms especially effecting breathing.]  Pharmacy Interventions   Pharmacy Dicussed/Reviewed Pharmacy Topics Reviewed  [medications reviewed and adherence discussed and advised.]           Patient Stated   On track    Continue 02/11/24-Stay active and spend lots of time with family     Patient Stated       02/11/24-Drink more water and eat healthier.       Depression Screen     02/11/2024    8:29 AM 06/16/2023    9:38 AM 06/16/2023    8:19 AM 02/10/2023    8:14 AM 01/14/2023    9:00 AM 02/05/2022    8:23 AM 01/24/2022   11:27 AM  PHQ 2/9 Scores  PHQ - 2 Score 0 0 0 0 0 0 0  PHQ- 9 Score  1 2        Fall Risk     02/11/2024    8:20 AM 01/13/2024    8:32 AM 06/16/2023    9:39 AM 06/16/2023    8:20 AM 02/19/2023    1:04 PM  Fall Risk   Falls in the past year? 1 0 0 1 0  Comment playing softball:fell on grass loosing balance      Number falls in past yr: 0 0 0 0   Injury with Fall? 0 0 0 0   Risk for fall due to : Impaired balance/gait No Fall Risks No Fall Risks History of fall(s) No Fall Risks  Risk for fall due to: Comment pt says she has neuropathy      Follow up Education provided;Falls prevention discussed Falls evaluation completed Falls evaluation completed Falls evaluation completed     MEDICARE RISK AT HOME:  Medicare Risk at Home Any stairs in or around the home?: Yes If so, are there any without handrails?: Yes Home free of loose throw  rugs in walkways, pet beds, electrical cords, etc?: Yes Adequate lighting in your home to reduce risk of falls?: Yes Life alert?: No Use of a cane, walker or w/c?: No Grab bars in the bathroom?: Yes Shower chair or bench in shower?: Yes Elevated toilet seat or a handicapped toilet?: Yes  TIMED UP AND GO:  Was the test performed?  No  Cognitive Function: 6CIT completed        02/11/2024    8:44 AM 02/10/2023    8:16 AM 02/05/2022    8:27 AM  6CIT Screen  What Year? 0 points 0 points 0 points  What month? 0 points 0 points 0 points  What time? 0 points 0 points 0 points  Count back from 20 0 points 0 points 0 points  Months in reverse 0 points 0 points 0 points  Repeat phrase 0 points 4 points 2 points  Total Score 0 points 4 points 2 points    Immunizations Immunization History  Administered Date(s) Administered   Pneumococcal Conjugate-13 04/02/2017   Pneumococcal Polysaccharide-23 01/19/2018    Screening Tests Health Maintenance  Topic Date Due   DTaP/Tdap/Td (1 - Tdap) Never done   Zoster Vaccines- Shingrix (1 of 2) Never done   DEXA SCAN  01/23/2020   MAMMOGRAM  12/18/2022   FOOT EXAM  05/02/2023   Colonoscopy  06/06/2023   OPHTHALMOLOGY EXAM  07/18/2023   Medicare Annual Wellness (AWV)  02/10/2024   INFLUENZA VACCINE  06/04/2024   HEMOGLOBIN A1C  07/15/2024   Diabetic kidney evaluation - eGFR measurement  01/12/2025   Diabetic kidney evaluation - Urine ACR  01/12/2025   Pneumonia Vaccine 50+ Years old  Completed   Hepatitis C Screening  Completed   HPV VACCINES  Aged Out   COVID-19 Vaccine  Discontinued    Health Maintenance  Health Maintenance Due  Topic Date Due   DTaP/Tdap/Td (1 - Tdap) Never done   Zoster Vaccines- Shingrix (1 of 2) Never done   DEXA SCAN  01/23/2020   MAMMOGRAM  12/18/2022   FOOT EXAM  05/02/2023   Colonoscopy  06/06/2023   OPHTHALMOLOGY EXAM  07/18/2023   Medicare Annual Wellness (AWV)  02/10/2024   Health Maintenance Items  Addressed: Vaccinations: pt declines: Influenza vaccine: recommend every Fall Shingles vaccine: recommend Shingrix which is 2 doses 2-6 months apart and over 90% effective     Covid-19: recommend 2 doses one month apart with a booster 6 months later   Additional Screening:  Vision Screening: Recommended annual ophthalmology exams for early detection of glaucoma and other disorders of the eye.  Dental Screening: Recommended annual dental exams for proper oral hygiene  Community Resource Referral / Chronic Care Management: CRR required this visit?  No   CCM required this visit?  No     Plan:     I have personally reviewed and noted the following in the patient's chart:   Medical and social history Use of alcohol, tobacco or illicit drugs  Current medications and supplements including opioid prescriptions. Patient is not currently taking opioid prescriptions. Functional ability and status Nutritional status Physical activity Advanced directives List of other physicians Hospitalizations, surgeries, and ER visits in previous 12 months Vitals Screenings to include cognitive, depression, and falls Referrals and appointments  In addition, I have reviewed and discussed with patient certain preventive protocols, quality metrics, and best practice recommendations. A written personalized care plan for preventive services as well as general preventive health recommendations were provided to patient.    Sue Lush, LPN   03/07/6643   After Visit Summary: (Declined) Due to this being a telephonic visit, with patients personalized plan was offered to patient but patient Declined AVS at this time   Notes: Nothing significant to report at this time.

## 2024-02-18 ENCOUNTER — Ambulatory Visit (INDEPENDENT_AMBULATORY_CARE_PROVIDER_SITE_OTHER)

## 2024-02-18 DIAGNOSIS — E538 Deficiency of other specified B group vitamins: Secondary | ICD-10-CM | POA: Diagnosis not present

## 2024-02-18 MED ORDER — CYANOCOBALAMIN 1000 MCG/ML IJ SOLN
1000.0000 ug | Freq: Once | INTRAMUSCULAR | Status: AC
Start: 2024-02-18 — End: 2024-02-18
  Administered 2024-02-18: 1000 ug via INTRAMUSCULAR

## 2024-02-18 NOTE — Progress Notes (Addendum)
 Per orders of Felicita Horns, NP, injection of #2 of 3 monthly B12 1000 mcg/ml given by Franne Ivory, CMA in Left Deltoid. Patient tolerated injection well.  Sent to Margarie Shay, NP in [place] Dugal's absence.

## 2024-02-25 ENCOUNTER — Ambulatory Visit: Payer: Medicare Other | Attending: Internal Medicine | Admitting: Internal Medicine

## 2024-02-25 ENCOUNTER — Encounter: Payer: Self-pay | Admitting: Internal Medicine

## 2024-02-25 ENCOUNTER — Telehealth: Payer: Self-pay | Admitting: Family

## 2024-02-25 VITALS — BP 110/66 | HR 69 | Ht 62.0 in | Wt 178.0 lb

## 2024-02-25 DIAGNOSIS — R0989 Other specified symptoms and signs involving the circulatory and respiratory systems: Secondary | ICD-10-CM | POA: Diagnosis not present

## 2024-02-25 DIAGNOSIS — Z952 Presence of prosthetic heart valve: Secondary | ICD-10-CM | POA: Diagnosis not present

## 2024-02-25 DIAGNOSIS — Z79899 Other long term (current) drug therapy: Secondary | ICD-10-CM | POA: Insufficient documentation

## 2024-02-25 DIAGNOSIS — R0609 Other forms of dyspnea: Secondary | ICD-10-CM | POA: Diagnosis not present

## 2024-02-25 DIAGNOSIS — I5032 Chronic diastolic (congestive) heart failure: Secondary | ICD-10-CM | POA: Insufficient documentation

## 2024-02-25 MED ORDER — FUROSEMIDE 20 MG PO TABS
20.0000 mg | ORAL_TABLET | Freq: Every day | ORAL | 3 refills | Status: AC
Start: 2024-02-25 — End: ?

## 2024-02-25 MED ORDER — AMOXICILLIN 500 MG PO TABS: 2000.0000 mg | ORAL_TABLET | ORAL | 5 refills | Status: AC

## 2024-02-25 MED ORDER — METOPROLOL SUCCINATE ER 25 MG PO TB24: ORAL_TABLET | ORAL | 0 refills | Status: DC

## 2024-02-25 NOTE — Progress Notes (Signed)
 Cardiology Office Note  Date: 02/25/2024   ID: Abigail [name], Abigail [name] [birth year], MRN [id number]  PCP:  Felicita Horns, FNP  Cardiologist:  Lasalle Pointer, MD Electrophysiologist:  None   Reason for Office Visit: Follow-up of post TAVR   History of Present Illness: Abigail [name] is a 73 y.o. female known to have severe aortic valve stenosis s/p Edwards SAPIEN 3 ultra Resilia THV (size 23 [name]) on 02/05/2023, mild mitral valve stenosis, HTN, DM 2, COPD is here for follow-up visit.  Patient was referred to cardiology clinic in 12/2022 for evaluation of severe aortic valve stenosis. Echocardiogram on 12/09/2022 showed showed LVEF 65 to 70%, severe asymmetric basal septal hypertrophy measuring 1.9 cm, mild SAM of the anterior MV leaflet, dynamic subvalvular gradient, peak gradient at least 25 mmHg, G1 DD, normal RV systolic function, severely dilated LA, mild mitral valve stenosis, severe stenosis of aortic valve with mean gradient 51 mmHg, peak gradient 88.6 mmHg, valve area 0.73 cm (aortic valve area by VTI was 1.04 cm and mean gradient 24 mmHg on 04/2021 study). She has symptoms of DOE for quite a while for which she was scheduled for LHC and RHC and referred to structural cardiology.  LHC showed mild nonobstructive CAD, 40% mid LAD stenosis and LHC/RHC confirmed findings of severe aortic valve stenosis. She subsequently underwent TAVR procedure on 02/05/2023 with no complications. Echocardiogram postprocedure showed normal functioning of the aortic valve prosthesis.  Cardiac MRI showed no evidence of HCM or cardiac amyloidosis.  Repeat echocardiogram showed mild LVH.  She is here for follow-up visit.  She has shortness of breath with exertional activities but improved compared to before.  No angina.  She was diagnosed with vertigo and takes meclizine  as needed.  No exertional dizziness.  No syncope, palpitations, leg swelling.  Compliant with medications.  Past Medical History:  Diagnosis Date    Allergy    Anxiety    Arthritis    hands, knees   Cancer (HCC)    Cataract    surgery to remove -bilateral   COPD (chronic obstructive pulmonary disease) (HCC)    Depression    Diabetes mellitus (HCC)    a. A1c 6.6 in 05/2013 indicating new dx.   Fatty liver    Female stress incontinence    GERD (gastroesophageal reflux disease)    Heart murmur    no problems   Hemorrhoids    History of kidney stones    surgery to remove   Hyperlipidemia    Hyperplastic colon polyp 06/07/2008   Hypertension    Hypertriglyceridemia    IBS (irritable bowel syndrome)    Lung nodule    a. 4mm left lung nodule by CT 05/2013.   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 Arlester Ladd and Dr. Honey Lusty   SCCA (squamous cell carcinoma) of skin 01/16/2022   Left Malar Cheek (in situ) (tx p bx)   Skin cancer    Squamous cell carcinoma of skin 01/16/2022   Right Breast (in situ) (curet and 5FU)    Past Surgical History:  Procedure Laterality Date   CATARACT EXTRACTION W/PHACO Left 10/06/2014   Procedure: CATARACT EXTRACTION PHACO AND INTRAOCULAR LENS PLACEMENT LEFT EYE;  Surgeon: Anner Kill, MD;  Location: [facility];  Service: Ophthalmology;  Laterality: Left;  CDE 7.35   CATARACT EXTRACTION W/PHACO Right 11/14/2014  Procedure: CATARACT EXTRACTION PHACO AND INTRAOCULAR LENS PLACEMENT RIGHT EYE;  Surgeon: Anner Kill, MD;  Location: [facility];  Service: Ophthalmology;  Laterality: Right;  CDE:6.39   COLONOSCOPY  06/2008   hx polyps   CYSTOSCOPY W/ RETROGRADES Right 01/21/2017   Procedure: CYSTOSCOPY WITH RETROGRADE PYELOGRAM;  Surgeon: Dustin Gimenez, MD;  Location: [facility];  Service: Urology;  Laterality: Right;   CYSTOSCOPY/URETEROSCOPY/HOLMIUM LASER/STENT PLACEMENT Left 01/21/2017   Procedure: CYSTOSCOPY/URETEROSCOPY/HOLMIUM LASER/STENT PLACEMENT;  Surgeon: Dustin Gimenez, MD;   Location: [facility];  Service: Urology;  Laterality: Left;   DORSAL COMPARTMENT RELEASE Left 06/15/2015   Procedure: LEFT WRIST DEQUERVAINS;  Surgeon: Ferd Householder, MD;  Location: [facility];  Service: Orthopedics;  Laterality: Left;   HERNIA REPAIR     INTRAOPERATIVE TRANSTHORACIC ECHOCARDIOGRAM N/A 02/05/2023   Procedure: INTRAOPERATIVE TRANSTHORACIC ECHOCARDIOGRAM;  Surgeon: Arnoldo Lapping, MD;  Location: [facility] INVASIVE CV LAB;  Service: Open Heart Surgery;  Laterality: N/A;   MOUTH SURGERY     tooth ext   NASAL SINUS SURGERY     RIGHT/LEFT HEART CATH AND CORONARY ANGIOGRAPHY N/A 01/08/2023   Procedure: RIGHT/LEFT HEART CATH AND CORONARY ANGIOGRAPHY;  Surgeon: Arnoldo Lapping, MD;  Location: [facility] INVASIVE CV LAB;  Service: Cardiovascular;  Laterality: N/A;   SHOULDER SURGERY     right   TONSILLECTOMY     TRANSCATHETER AORTIC VALVE REPLACEMENT, TRANSFEMORAL Right 02/05/2023   Procedure: Transcatheter Aortic Valve Replacement, Transfemoral;  Surgeon: Arnoldo Lapping, MD;  Location: [facility] INVASIVE CV LAB;  Service: Open Heart Surgery;  Laterality: Right;   UPPER GI ENDOSCOPY     normal per patient   VENTRAL HERNIA REPAIR      Current Outpatient Medications  Medication Sig Dispense Refill   albuterol  (VENTOLIN  HFA) 108 (90 Base) MCG/ACT inhaler Inhale 2 puffs into the lungs every 4 (four) hours as needed for wheezing or shortness of breath. 1 each 1   amoxicillin  (AMOXIL ) 500 MG tablet Take 4 tablets (2,000 mg total) by mouth as directed. 1 HOUR PRIOR TO DENTAL APPOINTMENTS 12 tablet 6   aspirin  81 MG chewable tablet Chew 1 tablet (81 mg total) by mouth daily.     Azelastine HCl 137 MCG/SPRAY SOLN Place 1 spray into both nostrils daily as needed (allergies/sinus issues.).     budesonide -formoterol  (SYMBICORT ) 80-4.5 MCG/ACT inhaler Inhale 2 puffs into the lungs in the morning and at bedtime. 1 each 12   COSENTYX  SENSOREADY, 300 MG, 150 MG/ML SOAJ Inject 300 mg into the skin every 30  (thirty) days.     diltiazem  (CARDIZEM  CD) 240 MG 24 hr capsule Take 1 capsule (240 mg total) by mouth daily. 90 capsule 3   DULoxetine  (CYMBALTA ) 30 MG capsule Take 1 capsule (30 mg total) by mouth at bedtime. 30 capsule 3   Evolocumab  (REPATHA  SURECLICK) 140 MG/ML SOAJ Inject 140 mg [place] every two weeks 2 mL 5   fluticasone  (FLONASE ) 50 MCG/ACT nasal spray Place 1 spray into both nostrils 2 (two) times daily. 16 g 2   furosemide  (LASIX ) 20 MG tablet TAKE 1 TABLET(20 MG) BY MOUTH DAILY AS NEEDED 30 tablet 3   gabapentin  (NEURONTIN ) 600 MG tablet TAKE 2 TABLETS BY MOUTH IN THE MORNING AND AT NIGHT 360 tablet 2   glucose blood (FREESTYLE LITE) test strip Use as instructed to monitor FSBS 1x daily. Dx: E11.9 100 each 1   glucose monitoring kit (FREESTYLE) monitoring kit 1 each by Does not apply route as needed for other. Dispense one glucometer of  choice, testing strips/lancets for once per day glucose checks.  QS for 1 month, 11 refills. 1 each 0   Lancets (FREESTYLE) lancets Use as instructed 100 each 12   lisinopril  (ZESTRIL ) 10 MG tablet Take 10 mg by mouth every evening.     loratadine  (CLARITIN ) 10 MG tablet Take 1 tablet (10 mg total) by mouth daily. 30 tablet 11   meclizine  (ANTIVERT ) 12.5 MG tablet Take 1 tablet (12.5 mg total) by mouth 3 (three) times daily as needed for dizziness. 30 tablet 0   metoprolol  succinate (TOPROL -XL) 50 MG 24 hr tablet Take 1.5 tablets (75 mg total) by mouth daily. 135 tablet 3   mometasone (NASONEX) 50 MCG/ACT nasal spray Place 2 sprays into the nose daily as needed (allergies).     Semaglutide , 2 MG/DOSE, (OZEMPIC , 2 MG/DOSE,) 8 MG/3ML SOPN Inject 2mg  into the skin once a week 9 mL 0   No current facility-administered medications for this visit.   Allergies:  Statins and Cinnamon   Social History: The patient  reports that she has never smoked. She has never used smokeless tobacco. She reports that she does not drink alcohol and does not use drugs.   Family  History: The patient's family history includes Breast cancer in her mother; Colon polyps in her maternal aunt; Heart disease in her maternal grandfather.   ROS:  Please see the history of present illness. Otherwise, complete review of systems is positive for none.  All other systems are reviewed and negative.   Physical Exam: VS:  Ht 5\' 2"  (1.575 m)   Wt 178 lb (80.7 kg)   BMI 32.56 kg/m , BMI Body mass index is 32.56 kg/m.  Wt Readings from Last 3 Encounters:  02/25/24 178 lb (80.7 kg)  02/11/24 170 lb (77.1 kg)  02/09/24 170 lb (77.1 kg)    General: Patient appears comfortable at rest. HEENT: Conjunctiva and lids normal, oropharynx clear with moist mucosa. Neck: Supple, no elevated JVP or carotid bruits, no thyromegaly. Lungs: Clear to auscultation, nonlabored breathing at rest. Cardiac: Regular rate and rhythm, no S3 or significant systolic murmur, no pericardial rub. Abdomen: Soft, nontender, no hepatomegaly, bowel sounds present, no guarding or rebound. Extremities: No pitting edema, distal pulses 2+. Skin: Warm and dry. Musculoskeletal: No kyphosis. Neuropsychiatric: Alert and oriented x3, affect grossly appropriate.  ECG:  An ECG dated 01/01/2023 was personally reviewed today and demonstrated:  Normal sinus rhythm, Q waves in inferior leads and T wave inversions in the lateral leads (1 and aVL).  Recent Labwork: 07/21/2023: Pro B Natriuretic peptide (BNP) 74.0 01/13/2024: ALT 19; AST 21; BUN 9; Creatinine, Ser 0.74; Hemoglobin 14.7; Platelets 189.0; Potassium 4.0; Sodium 143; TSH 0.53     Component Value Date/Time   CHOL 126 01/13/2024 0925   TRIG 170.0 (H) 01/13/2024 0925   HDL 50.00 01/13/2024 0925   CHOLHDL 3 01/13/2024 0925   VLDL 34.0 01/13/2024 0925   LDLCALC 42 01/13/2024 0925   LDLCALC 168 (H) 07/02/2021 1224   LDLDIRECT 58.0 03/17/2023 0741     Assessment and Plan:  Severe aortic valve stenosis s/p TAVR Abigail [name]) on  April 2024: Asymptomatic.  Echo from April 2025 showed normal LVEF and normal functioning TAVR prosthesis.  Mean PG 11 mmHg.  Dental cleanings twice per year and SBE prophylaxis prior to dental procedures.  Will refill amoxicillin  2 g.  Continue aspirin  81 mg once daily.  Repeat echocardiogram in 1 year before the next  clinic visit.  LVH: Initially, echocardiogram showed severe LVH consistent with hypertrophic cardiomyopathy and dynamic subvalvular gradient.  However, cardiac MRI did not meet criteria for HCM.  There was no evidence of cardiac amyloidosis either.  Repeat echocardiogram from April 2025 showed mild LVH.  No further cardiac workup at this time.  Previously started on metoprolol  for presumed HCM, will wean off and stop metoprolol .  Chronic diastolic heart failure: Repeat echocardiogram showed normal LVEF, G2 DD.  COPD unable to estimate.  Symptomatic with DOE.  Currently on p.o. Lasix  as needed, will switch to scheduled Lasix  regimen.  Take p.o. Lasix  20 mg once daily.  Obtain BMP in 5 days.  If the repeat BMP is normal, we will start her on Farxiga or Jardiance .  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  Severe MAC.  Will monitor.  HLD, statin intolerance: Intolerant to statins.  Continue Repatha .  Follow-up with PCP.  Mild nonobstructive CAD by LHC.  LDL 58 in 2024.  HTN, controlled: Continue current antihypertensives, lisinopril  10 mg once daily.  Continue diltiazem  240 mg once daily.  Will wean off metoprolol  that was previously started for presumed hypertrophic cardiomyopathy.     Medication Adjustments/Labs and Tests Ordered: Current medicines are reviewed at length with the patient today.  Concerns regarding medicines are outlined above.   Tests Ordered: Orders Placed This Encounter  Procedures   EKG 12-Lead    Medication Changes: No orders of the defined types were placed in this encounter.   Disposition:  Follow up 1 year  Signed, Charelle Petrakis Beauford Bounds, MD, 02/25/2024 9:55 AM    [facility] [address], [place], [place] [address]

## 2024-02-25 NOTE — Telephone Encounter (Signed)
 Copied from CRM [id number]. Topic: Clinical - Medication Question >> Feb 25, 2024  2:35 PM Earnestine Goes B wrote: Reason for CRM: pt called to speak with nurse regarding medication, would like to ask some question  please call pt back at  [phone]

## 2024-02-25 NOTE — Patient Instructions (Signed)
 Medication Instructions:  Your physician has recommended you make the following change in your medication:   -Decrease Metoprolol  to 50 mg for one week, THEN 25 mg for one week, THEN stop. -Change Lasix  to 20 mg once daily   *If you need a refill on your cardiac medications before your next appointment, please call your pharmacy*  Lab Work: In 7 Days- BMP   If you have labs (blood work) drawn today and your tests are completely normal, you will receive your results only by: MyChart Message (if you have MyChart) OR A paper copy in the mail If you have any lab test that is abnormal or we need to change your treatment, we will call you to review the results.  Testing/Procedures: Your physician has requested that you have an echocardiogram. Echocardiography is a painless test that uses sound waves to create images of your heart. It provides your doctor with information about the size and shape of your heart and how well your heart's chambers and valves are working. This procedure takes approximately one hour. There are no restrictions for this procedure. Please do NOT wear cologne, perfume, aftershave, or lotions (deodorant is allowed). Please arrive 15 minutes prior to your appointment time.  Please note: We ask at that you not bring children with you during ultrasound (echo/ vascular) testing. Due to room size and safety concerns, children are not allowed in the ultrasound rooms during exams. Our front office staff cannot provide observation of children in our lobby area while testing is being conducted. An adult accompanying a patient to their appointment will only be allowed in the ultrasound room at the discretion of the ultrasound technician under special circumstances. We apologize for any inconvenience.   Follow-Up: At [facility], you and your health needs are our priority.  As part of our continuing mission to provide you with exceptional heart care, our providers are all  part of one team.  This team includes your primary Cardiologist (physician) and Advanced Practice Providers or APPs (Physician Assistants and Nurse Practitioners) who all work together to provide you with the care you need, when you need it.  Your next appointment:   1 year(s)  Provider:   You may see Vishnu P Mallipeddi, MD or one of the following Advanced Practice Providers on your designated Care Team:   [place], PA-C  [place]ia [place], [place] Theotis Flake, [place]     We recommend signing up for the patient portal called "MyChart".  Sign up information is provided on this After Visit Summary.  MyChart is used to connect with patients for Virtual Visits (Telemedicine).  Patients are able to view lab/test results, encounter notes, upcoming appointments, etc.  Non-urgent messages can be sent to your provider as well.   To learn more about what you can do with MyChart, go to [email or web address].   Other Instructions

## 2024-02-26 NOTE — Telephone Encounter (Signed)
 Spoke with pt yesterday when she called in. She had some questions about her medications from Cardiology. Pt had an appointment with them on 02/25/24 and was confused about her medications. I read her the AVS instructions from her appointment about which medications she was to continue. Nothing further was needed at the time.

## 2024-02-27 ENCOUNTER — Ambulatory Visit (INDEPENDENT_AMBULATORY_CARE_PROVIDER_SITE_OTHER): Admitting: Family

## 2024-02-27 ENCOUNTER — Encounter: Payer: Self-pay | Admitting: Family

## 2024-02-27 VITALS — BP 114/62 | HR 65 | Temp 97.8°F | Ht 62.0 in | Wt 178.4 lb

## 2024-02-27 DIAGNOSIS — H66012 Acute suppurative otitis media with spontaneous rupture of ear drum, left ear: Secondary | ICD-10-CM | POA: Diagnosis not present

## 2024-02-27 DIAGNOSIS — J339 Nasal polyp, unspecified: Secondary | ICD-10-CM | POA: Insufficient documentation

## 2024-02-27 DIAGNOSIS — J301 Allergic rhinitis due to pollen: Secondary | ICD-10-CM | POA: Diagnosis not present

## 2024-02-27 MED ORDER — CEFDINIR 300 MG PO CAPS: 300.0000 mg | ORAL_CAPSULE | Freq: Two times a day (BID) | ORAL | 0 refills | Status: AC

## 2024-02-27 MED ORDER — MONTELUKAST SODIUM 10 MG PO TABS: 10.0000 mg | ORAL_TABLET | Freq: Every day | ORAL | 3 refills | Status: DC

## 2024-02-27 NOTE — Progress Notes (Signed)
 Established Patient Office Visit  Subjective:   Patient ID: Abigail [name], female    DOB: 15-Jun-1951  Age: 73 y.o. MRN: [id number]  CC:  Chief Complaint  Patient presents with   Acute Visit    Still having issues with sinus congestion, runny nose and sinus pain. She has an appointment with ENT but it's not until next month.    HPI: Abigail [name] is a 73 y.o. female presenting on 02/27/2024 for Acute Visit (Still having issues with sinus congestion, runny nose and sinus pain. She has an appointment with ENT but it's not until next month.)  Again with sinus concerns, sinus pressure in the face.  She is going to [place]  in the next month and hopes to feel better prior to that.  She has an upcoming appt with ENT but doesn't have consult until next month.  She is taking zyrtec intermittently but not daily and is using flonase  daily which she states is helpful. She states worse in the last few weeks with sinus pressure and pain, nasal congestion, post nasal drip, no sore throat or fever. No chest congestion. She states doxycycline  helps her really well but augmentin  never seems to help.        ROS: Negative unless specifically indicated above in HPI.   Relevant past medical history reviewed and updated as indicated.   Allergies and medications reviewed and updated.   Current Outpatient Medications:    albuterol  (VENTOLIN  HFA) 108 (90 Base) MCG/ACT inhaler, Inhale 2 puffs into the lungs every 4 (four) hours as needed for wheezing or shortness of breath., Disp: 1 each, Rfl: 1   amoxicillin  (AMOXIL ) 500 MG tablet, Take 4 tablets (2,000 mg total) by mouth as directed. 1 HOUR PRIOR TO DENTAL APPOINTMENTS, Disp: 12 tablet, Rfl: 5   aspirin  81 MG chewable tablet, Chew 1 tablet (81 mg total) by mouth daily., Disp: , Rfl:    Azelastine HCl 137 MCG/SPRAY SOLN, Place 1 spray into both nostrils daily as needed (allergies/sinus issues.)., Disp: , Rfl:    budesonide -formoterol  (SYMBICORT ) 80-4.5  MCG/ACT inhaler, Inhale 2 puffs into the lungs in the morning and at bedtime., Disp: 1 each, Rfl: 12   cefdinir  (OMNICEF ) 300 MG capsule, Take 1 capsule (300 mg total) by mouth 2 (two) times daily for 10 days., Disp: 20 capsule, Rfl: 0   COSENTYX  SENSOREADY, 300 MG, 150 MG/ML SOAJ, Inject 300 mg into the skin every 30 (thirty) days., Disp: , Rfl:    DULoxetine  (CYMBALTA ) 30 MG capsule, Take 1 capsule (30 mg total) by mouth at bedtime., Disp: 30 capsule, Rfl: 3   Evolocumab  (REPATHA  SURECLICK) 140 MG/ML SOAJ, Inject 140 mg [place] every two weeks, Disp: 2 mL, Rfl: 5   fluticasone  (FLONASE ) 50 MCG/ACT nasal spray, Place 1 spray into both nostrils 2 (two) times daily., Disp: 16 g, Rfl: 2   furosemide  (LASIX ) 20 MG tablet, Take 1 tablet (20 mg total) by mouth daily., Disp: 90 tablet, Rfl: 3   gabapentin  (NEURONTIN ) 600 MG tablet, TAKE 2 TABLETS BY MOUTH IN THE MORNING AND AT NIGHT, Disp: 360 tablet, Rfl: 2   glucose blood (FREESTYLE LITE) test strip, Use as instructed to monitor FSBS 1x daily. Dx: E11.9, Disp: 100 each, Rfl: 1   glucose monitoring kit (FREESTYLE) monitoring kit, 1 each by Does not apply route as needed for other. Dispense one glucometer of choice, testing strips/lancets for once per day glucose checks.  QS for 1 month, 11 refills., Disp: 1 each, Rfl:  0   Lancets (FREESTYLE) lancets, Use as instructed, Disp: 100 each, Rfl: 12   lisinopril  (ZESTRIL ) 10 MG tablet, Take 10 mg by mouth every evening., Disp: , Rfl:    loratadine  (CLARITIN ) 10 MG tablet, Take 1 tablet (10 mg total) by mouth daily., Disp: 30 tablet, Rfl: 11   meclizine  (ANTIVERT ) 12.5 MG tablet, Take 1 tablet (12.5 mg total) by mouth 3 (three) times daily as needed for dizziness., Disp: 30 tablet, Rfl: 0   metoprolol  succinate (TOPROL  XL) 25 MG 24 hr tablet, Take 2 tablets (50 mg total) by mouth daily for 7 days, THEN 1 tablet (25 mg total) daily for 7 days., Disp: 21 tablet, Rfl: 0   mometasone (NASONEX) 50 MCG/ACT nasal spray, Place  2 sprays into the nose daily as needed (allergies)., Disp: , Rfl:    montelukast (SINGULAIR) 10 MG tablet, Take 1 tablet (10 mg total) by mouth at bedtime., Disp: 30 tablet, Rfl: 3   Semaglutide , 2 MG/DOSE, (OZEMPIC , 2 MG/DOSE,) 8 MG/3ML SOPN, Inject 2mg  into the skin once a week, Disp: 9 mL, Rfl: 0   diltiazem  (CARDIZEM  CD) 240 MG 24 hr capsule, Take 1 capsule (240 mg total) by mouth daily., Disp: 90 capsule, Rfl: 3  Allergies  Allergen Reactions   Statins Other (See Comments)    Failed simvastatin, pravastatin , and rosuvastatin  5 mg weekly (myalgias)   Cinnamon     Dry throat     Objective:   BP 114/62 (BP Location: Left Arm, Patient Position: Sitting, Cuff Size: Normal)   Pulse 65   Temp 97.8 F (36.6 C) (Temporal)   Ht 5\' 2"  (1.575 m)   Wt 178 lb 6.4 oz (80.9 kg)   SpO2 98%   BMI 32.63 kg/m    Physical Exam Constitutional:      General: She is not in acute distress.    Appearance: Normal appearance. She is normal weight. She is not ill-appearing, toxic-appearing or diaphoretic.  HENT:     Head: Normocephalic.     Right Ear: Tympanic membrane normal.     Left Ear: Drainage (yellow pus) present. A middle ear effusion is present. Tympanic membrane is perforated.     Nose:     Right Sinus: Frontal sinus tenderness present.     Left Sinus: Frontal sinus tenderness present.     Mouth/Throat:     Mouth: Mucous membranes are dry.     Pharynx: No oropharyngeal exudate or posterior oropharyngeal erythema.  Eyes:     Extraocular Movements: Extraocular movements intact.     Pupils: Pupils are equal, round, and reactive to light.  Cardiovascular:     Rate and Rhythm: Normal rate and regular rhythm.     Pulses: Normal pulses.     Heart sounds: Normal heart sounds.  Pulmonary:     Effort: Pulmonary effort is normal.     Breath sounds: Normal breath sounds.  Musculoskeletal:     Cervical back: Normal range of motion.  Neurological:     General: No focal deficit present.      Mental Status: She is alert and oriented to person, place, and time. Mental status is at baseline.  Psychiatric:        Mood and Affect: Mood normal.        Behavior: Behavior normal.        Thought Content: Thought content normal.        Judgment: Judgment normal.     Assessment & Plan:  Nasal polyps -  Montelukast Sodium; Take 1 tablet (10 mg total) by mouth at bedtime.  Dispense: 30 tablet; Refill: 3  Non-recurrent acute suppurative otitis media of left ear with spontaneous rupture of tympanic membrane Assessment & Plan: Take antibiotic as prescribed. Increase oral fluids. Pt to f/u if sx worsen and or fail to improve in 2-3 days.  Rx cefdinir  300 mg po bid x 10 days  F/u with ENT as scheduled for consult next month as well    Orders: -     Cefdinir ; Take 1 capsule (300 mg total) by mouth 2 (two) times daily for 10 days.  Dispense: 20 capsule; Refill: 0  Seasonal allergic rhinitis due to pollen Assessment & Plan: Continue flonase  start zyrtec daily Start Singulair nightly for nasal polyps and recurrent sinusitis      Follow up plan: Return if symptoms worsen or fail to improve.  Felicita Horns, FNP

## 2024-02-27 NOTE — Assessment & Plan Note (Signed)
 Take antibiotic as prescribed. Increase oral fluids. Pt to f/u if sx worsen and or fail to improve in 2-3 days.  Rx cefdinir  300 mg po bid x 10 days  F/u with ENT as scheduled for consult next month as well

## 2024-02-27 NOTE — Assessment & Plan Note (Signed)
 Continue flonase  start zyrtec daily Start Singulair nightly for nasal polyps and recurrent sinusitis

## 2024-03-03 ENCOUNTER — Other Ambulatory Visit (HOSPITAL_COMMUNITY)
Admission: RE | Admit: 2024-03-03 | Discharge: 2024-03-03 | Disposition: A | Discharge status: Home / Self Care | Source: Ambulatory Visit | Attending: Internal Medicine | Admitting: Internal Medicine

## 2024-03-03 DIAGNOSIS — Z79899 Other long term (current) drug therapy: Secondary | ICD-10-CM | POA: Diagnosis not present

## 2024-03-03 LAB — BASIC METABOLIC PANEL WITH GFR
Anion gap: 8 (ref 5–15)
BUN: 12 mg/dL (ref 8–23)
CO2: 24 mmol/L (ref 22–32)
Calcium: 8.9 mg/dL (ref 8.9–10.3)
Chloride: 107 mmol/L (ref 98–111)
Creatinine, Ser: 0.65 mg/dL (ref 0.44–1.00)
GFR, Estimated: >60 mL/min (ref >60)
Glucose, Bld: 98 mg/dL (ref 70–99)
Potassium: 3.8 mmol/L (ref 3.5–5.1)
Sodium: 139 mmol/L (ref 135–145)

## 2024-03-05 ENCOUNTER — Telehealth: Payer: Self-pay | Admitting: Internal Medicine

## 2024-03-05 NOTE — Telephone Encounter (Signed)
 Pt c/o medication issue:  1. Name of Medication:  Furosemide   2. How are you currently taking this medication (dosage and times per day)?   3. Are you having a reaction (difficulty breathing--STAT)?  4. What is your medication issue?   Patient would like to know if she needs to continure on Furosemide . Please advise.

## 2024-03-05 NOTE — Telephone Encounter (Signed)
 Per office note on 02/25/24  Chronic diastolic heart failure: Repeat echocardiogram showed normal LVEF, G2 DD. COPD unable to estimate. Symptomatic with DOE. Currently on p.o. Lasix  as needed, will switch to scheduled Lasix  regimen. Take p.o. Lasix  20 mg once daily. Obtain BMP in 5 days. If the repeat BMP is normal, we will start her on Farxiga or Jardiance    Pt informed to continue to take Lasix  20 mg daily.

## 2024-03-09 ENCOUNTER — Telehealth: Payer: Self-pay

## 2024-03-09 DIAGNOSIS — Z79899 Other long term (current) drug therapy: Secondary | ICD-10-CM

## 2024-03-09 MED ORDER — DAPAGLIFLOZIN PROPANEDIOL 10 MG PO TABS: 10.0000 mg | ORAL_TABLET | Freq: Every day | ORAL | 11 refills | Status: DC

## 2024-03-09 NOTE — Telephone Encounter (Signed)
-----   Message from Vishnu P Mallipeddi sent at 03/08/2024 10:51 AM EDT ----- BMP is normal. Start Farxiga or Jardiance  10 mg once daily for diastolic HF (whichever is cheaper based on her insurance). Obtain BMP in 5 days.

## 2024-03-09 NOTE — Telephone Encounter (Signed)
 The patient has been notified of the result and verbalized understanding.  All questions (if any) were answered. Casper Clement, CMA 03/09/2024 11:28 AM

## 2024-03-11 ENCOUNTER — Telehealth: Payer: Self-pay | Admitting: Internal Medicine

## 2024-03-11 NOTE — Telephone Encounter (Signed)
 Pt c/o medication issue:  1. Name of Medication:   dapagliflozin propanediol (FARXIGA) 10 MG TABS tablet    2. How are you currently taking this medication (dosage and times per day)?    3. Are you having a reaction (difficulty breathing--STAT)? no  4. What is your medication issue? Calling to see if she can start the medication on 5/19 because she will be out of town next. Please advise

## 2024-03-12 NOTE — Telephone Encounter (Signed)
--   Message from Vishnu P Mallipeddi sent at 03/08/2024 10:51 AM EDT ----- BMP is normal. Start Farxiga  or Jardiance  10 mg once daily for diastolic HF (whichever is cheaper based on her insurance). Obtain BMP in 5 days.   Please advise

## 2024-03-15 NOTE — Telephone Encounter (Signed)
 Left a message for patient to call office back regarding medication/lab work

## 2024-03-16 NOTE — Telephone Encounter (Signed)
 Spoke to patient and notified her that provider stated it was okay to start medication on 5/19. Patient voiced understanding to complete lab work 5 days post medication start.   Patient had no further questions or concerns at this time.

## 2024-03-24 ENCOUNTER — Ambulatory Visit

## 2024-03-24 ENCOUNTER — Ambulatory Visit (INDEPENDENT_AMBULATORY_CARE_PROVIDER_SITE_OTHER): Admitting: Audiology

## 2024-03-24 ENCOUNTER — Institutional Professional Consult (permissible substitution) (INDEPENDENT_AMBULATORY_CARE_PROVIDER_SITE_OTHER): Admitting: Otolaryngology

## 2024-03-24 DIAGNOSIS — S46812A Strain of other muscles, fascia and tendons at shoulder and upper arm level, left arm, initial encounter: Secondary | ICD-10-CM | POA: Diagnosis not present

## 2024-03-24 NOTE — Progress Notes (Deleted)
  [address], [address] [place], [place] [address] [phone]  Audiological Evaluation    Name: Abigail [name]     DOB:   Jun 08, 1951      MRN:   [id number]                                                                                     Service Date: 03/24/2024     Accompanied by: ***   Patient comes today after {ENT providers:31223} sent a referral for a hearing evaluation due to concerns with {audiology symptoms:31224}.   Symptoms Yes Details  Hearing loss  []    Tinnitus  []    Ear pain/ infections/pressure  []    Balance problems  []    Noise exposure history  []    Previous ear surgeries  []    Family history of hearing loss  []    Amplification  []    Other  []      Otoscopy: Right ear: {otoscopy:31227} Left ear:  {otoscopy:31227}  Tympanometry: Right ear: {tympanometry results:31367}. Left ear: {tympanometry results:31367}.   Pure tone Audiometry: Right ear- *** {hearing loss types:31372::"sensorineural hearing loss"} from *** Hz - *** Hz. Left ear-  *** {hearing loss types:31372::"sensorineural hearing loss"} from *** Hz - *** Hz.  Speech Audiometry: Right ear- Speech Reception Threshold (SRT) was obtained at *** dBHL. Left ear-Speech Reception Threshold (SRT) was obtained at *** dBHL.   Word Recognition Score Tested using {word lists:31376::"NU-6 (MLV)"} Right ear: ***% was obtained at a presentation level of *** dBHL with contralateral masking which is deemed as  {word recognition score:31373}. Left ear: ***% was obtained at a presentation level of *** dBHL with contralateral masking which is deemed as  {word recognition score:31373}.   The hearing test results were completed {transducer options:31388} and results are deemed to be of {test reliability:31390::"good reliability"}. Test technique:  conventional     Recommendations: {Audiology Recommendations:31370::"Follow up with ENT as scheduled for today."}   Evia Goldsmith MARIE LEROUX-MARTINEZ, AUD

## 2024-03-30 ENCOUNTER — Other Ambulatory Visit: Payer: Self-pay | Admitting: Podiatry

## 2024-03-30 ENCOUNTER — Other Ambulatory Visit (HOSPITAL_COMMUNITY)
Admission: RE | Admit: 2024-03-30 | Discharge: 2024-03-30 | Disposition: A | Discharge status: Home / Self Care | Source: Ambulatory Visit | Attending: Internal Medicine | Admitting: Internal Medicine

## 2024-03-30 DIAGNOSIS — Z79899 Other long term (current) drug therapy: Secondary | ICD-10-CM | POA: Insufficient documentation

## 2024-03-30 LAB — BASIC METABOLIC PANEL WITH GFR
Anion gap: 8 (ref 5–15)
BUN: 9 mg/dL (ref 8–23)
CO2: 25 mmol/L (ref 22–32)
Calcium: 8.6 mg/dL — ABNORMAL LOW (ref 8.9–10.3)
Chloride: 109 mmol/L (ref 98–111)
Creatinine, Ser: 0.67 mg/dL (ref 0.44–1.00)
GFR, Estimated: >60 mL/min (ref >60)
Glucose, Bld: 109 mg/dL — ABNORMAL HIGH (ref 70–99)
Potassium: 3.8 mmol/L (ref 3.5–5.1)
Sodium: 142 mmol/L (ref 135–145)

## 2024-04-07 ENCOUNTER — Other Ambulatory Visit: Payer: Self-pay

## 2024-04-07 MED ORDER — GABAPENTIN 600 MG PO TABS: ORAL_TABLET | ORAL | 2 refills | Status: DC

## 2024-04-09 ENCOUNTER — Ambulatory Visit: Payer: Self-pay | Admitting: Internal Medicine

## 2024-04-12 ENCOUNTER — Telehealth: Payer: Self-pay | Admitting: Internal Medicine

## 2024-04-12 ENCOUNTER — Telehealth: Payer: Self-pay

## 2024-04-12 ENCOUNTER — Other Ambulatory Visit: Payer: Self-pay | Admitting: Family

## 2024-04-12 DIAGNOSIS — B3731 Acute candidiasis of vulva and vagina: Secondary | ICD-10-CM

## 2024-04-12 MED ORDER — MICONAZOLE 3 200 MG VA SUPP
200.0000 mg | Freq: Every day | VAGINAL | 0 refills | Status: DC
Start: 2024-04-12 — End: 2024-06-22

## 2024-04-12 NOTE — Telephone Encounter (Signed)
 Pt c/o medication issue:  1. Name of Medication: dapagliflozin  propanediol (FARXIGA ) 10 MG TABS tablet   2. How are you currently taking this medication (dosage and times per day)? As written   3. Are you having a reaction (difficulty breathing--STAT)? No   4. What is your medication issue? Pt called in stating this med has caused her a yeast infection. Please advise if you can prescribe something to help with this issue.

## 2024-04-12 NOTE — Telephone Encounter (Signed)
 Patient will stop Farxiga  and call PCP for treatment of yeast infection  Added to allergy list

## 2024-04-12 NOTE — Telephone Encounter (Signed)
 Copied from CRM [id number]. Topic: Clinical - Medication Question >> Apr 12, 2024  2:19 PM Adonis Hoot wrote: Reason for CRM: Patient was prescribed medication farxiga  by her heart doctor and it has caused her to have a yeast infection.Her heart doctor advised her to call her PCP to get some medication for the yeast infection  [facility] [phone] - [place], [place] - [address] AT [facility] [address]  Phone: [phone] Fax: [phone]

## 2024-04-12 NOTE — Telephone Encounter (Signed)
 Hey Abigail [name]! I am sorry to hear you ended up with a yeast infection with that new medication.   I am sending in some vaginal suppositories you will insert one once nightly for three days. Let me know if symptoms do not improve.    Am guessing you are no longer going to be taking farxiga ? But if you are, sometimes I hear that patients do well with having a squirt bottle of water by the toilet and squirt the area after peeing. This is because with farxiga , the sugar is urinated out so the urine is full of sugar. When this sits on the vaginal area it causes a yeast infection to grow. Either way, just wanted to share some info!

## 2024-04-12 NOTE — Telephone Encounter (Signed)
 LM for pt to returncall

## 2024-04-12 NOTE — Telephone Encounter (Signed)
 I will forward to Dr.Mallipeddi and Pharm-D

## 2024-04-13 NOTE — Telephone Encounter (Signed)
 Spoke with pt and she is aware of Tabitha's message. She verbalized understanding.

## 2024-05-12 DIAGNOSIS — L918 Other hypertrophic disorders of the skin: Secondary | ICD-10-CM | POA: Diagnosis not present

## 2024-05-12 DIAGNOSIS — L400 Psoriasis vulgaris: Secondary | ICD-10-CM | POA: Diagnosis not present

## 2024-05-12 DIAGNOSIS — D1801 Hemangioma of skin and subcutaneous tissue: Secondary | ICD-10-CM | POA: Diagnosis not present

## 2024-05-12 DIAGNOSIS — L570 Actinic keratosis: Secondary | ICD-10-CM | POA: Diagnosis not present

## 2024-05-12 DIAGNOSIS — D692 Other nonthrombocytopenic purpura: Secondary | ICD-10-CM | POA: Diagnosis not present

## 2024-05-12 DIAGNOSIS — L821 Other seborrheic keratosis: Secondary | ICD-10-CM | POA: Diagnosis not present

## 2024-05-12 DIAGNOSIS — Z85828 Personal history of other malignant neoplasm of skin: Secondary | ICD-10-CM | POA: Diagnosis not present

## 2024-05-12 DIAGNOSIS — L578 Other skin changes due to chronic exposure to nonionizing radiation: Secondary | ICD-10-CM | POA: Diagnosis not present

## 2024-05-12 DIAGNOSIS — L723 Sebaceous cyst: Secondary | ICD-10-CM | POA: Diagnosis not present

## 2024-05-12 DIAGNOSIS — D224 Melanocytic nevi of scalp and neck: Secondary | ICD-10-CM | POA: Diagnosis not present

## 2024-05-12 DIAGNOSIS — L814 Other melanin hyperpigmentation: Secondary | ICD-10-CM | POA: Diagnosis not present

## 2024-05-17 ENCOUNTER — Other Ambulatory Visit: Payer: Self-pay

## 2024-05-17 DIAGNOSIS — E1149 Type 2 diabetes mellitus with other diabetic neurological complication: Secondary | ICD-10-CM

## 2024-05-17 MED ORDER — OZEMPIC (2 MG/DOSE) 8 MG/3ML ~~LOC~~ SOPN: PEN_INJECTOR | SUBCUTANEOUS | 0 refills | Status: DC

## 2024-05-19 ENCOUNTER — Ambulatory Visit (INDEPENDENT_AMBULATORY_CARE_PROVIDER_SITE_OTHER): Admitting: Podiatry

## 2024-05-19 DIAGNOSIS — M7751 Other enthesopathy of right foot: Secondary | ICD-10-CM | POA: Diagnosis not present

## 2024-05-19 DIAGNOSIS — G5791 Unspecified mononeuropathy of right lower limb: Secondary | ICD-10-CM | POA: Diagnosis not present

## 2024-05-19 DIAGNOSIS — M7752 Other enthesopathy of left foot: Secondary | ICD-10-CM

## 2024-05-19 DIAGNOSIS — G5792 Unspecified mononeuropathy of left lower limb: Secondary | ICD-10-CM

## 2024-05-19 NOTE — Progress Notes (Signed)
 She presents today complaining of pain to the top of the feet states that they really have been hurting lately nothing really makes her feel any better she had been seeing pain clinic with Dr. Dannial until he shut his clinic down.  But states that her feet hurt so bad that she can barely stand and she said it is very depressing and limits her ability to do the things she wants to do.  She says she is only 73 years old.  States that the injections to the dorsal aspect of the foot were not as good as the ones in the interdigital spaces several months ago.  Objective: Vital signs stable oriented x 3 severe pain and neuropathy to the bilateral foot majority of her pain is in her midfoot and forefoot.  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  Plan: Discussed etiology pathology conservative surgical therapies at this point we will refer her to [facility].  I did go ahead and inject the dorsal aspect of the bilateral foot today in the distal intermetatarsal spaces.  A total of 20 mg of Kenalog  was used amongst the 2 feet.  Follow-up with her in 4 months

## 2024-05-20 DIAGNOSIS — G5792 Unspecified mononeuropathy of left lower limb: Secondary | ICD-10-CM | POA: Diagnosis not present

## 2024-05-20 DIAGNOSIS — M7751 Other enthesopathy of right foot: Secondary | ICD-10-CM | POA: Diagnosis not present

## 2024-05-20 DIAGNOSIS — M7752 Other enthesopathy of left foot: Secondary | ICD-10-CM | POA: Diagnosis not present

## 2024-05-20 DIAGNOSIS — G5791 Unspecified mononeuropathy of right lower limb: Secondary | ICD-10-CM | POA: Diagnosis not present

## 2024-05-20 MED ORDER — TRIAMCINOLONE ACETONIDE 40 MG/ML IJ SUSP
80.0000 mg | Freq: Once | INTRAMUSCULAR | Status: AC
  Administered 2024-05-20: 80 mg

## 2024-05-20 NOTE — Addendum Note (Signed)
 Addended by: ELAYNE ROSINA BRAVO on: 05/20/2024 07:35 PM   Modules accepted: Orders

## 2024-05-24 ENCOUNTER — Other Ambulatory Visit: Payer: Self-pay | Admitting: *Deleted

## 2024-05-24 MED ORDER — DILTIAZEM HCL ER COATED BEADS 240 MG PO CP24: 240.0000 mg | ORAL_CAPSULE | Freq: Every day | ORAL | 3 refills | Status: AC

## 2024-06-08 ENCOUNTER — Telehealth: Payer: Self-pay | Admitting: Podiatry

## 2024-06-08 NOTE — Telephone Encounter (Signed)
 Patient called today stating she has not heard from the pain clinic to schedule an appt yet.

## 2024-06-08 NOTE — Telephone Encounter (Signed)
 Faxed referral

## 2024-06-22 ENCOUNTER — Telehealth: Payer: Self-pay | Admitting: Family

## 2024-06-22 ENCOUNTER — Encounter: Payer: Self-pay | Admitting: Family

## 2024-06-22 ENCOUNTER — Ambulatory Visit (INDEPENDENT_AMBULATORY_CARE_PROVIDER_SITE_OTHER): Admitting: Family

## 2024-06-22 VITALS — BP 120/64 | HR 93 | Temp 98.6°F | Ht 62.0 in | Wt 170.0 lb

## 2024-06-22 DIAGNOSIS — E1142 Type 2 diabetes mellitus with diabetic polyneuropathy: Secondary | ICD-10-CM | POA: Diagnosis not present

## 2024-06-22 DIAGNOSIS — Z7985 Long-term (current) use of injectable non-insulin antidiabetic drugs: Secondary | ICD-10-CM

## 2024-06-22 DIAGNOSIS — Z1231 Encounter for screening mammogram for malignant neoplasm of breast: Secondary | ICD-10-CM | POA: Diagnosis not present

## 2024-06-22 DIAGNOSIS — D513 Other dietary vitamin B12 deficiency anemia: Secondary | ICD-10-CM | POA: Diagnosis not present

## 2024-06-22 DIAGNOSIS — E1169 Type 2 diabetes mellitus with other specified complication: Secondary | ICD-10-CM | POA: Diagnosis not present

## 2024-06-22 DIAGNOSIS — E1149 Type 2 diabetes mellitus with other diabetic neurological complication: Secondary | ICD-10-CM | POA: Diagnosis not present

## 2024-06-22 DIAGNOSIS — E785 Hyperlipidemia, unspecified: Secondary | ICD-10-CM | POA: Diagnosis not present

## 2024-06-22 DIAGNOSIS — M150 Primary generalized (osteo)arthritis: Secondary | ICD-10-CM

## 2024-06-22 DIAGNOSIS — G63 Polyneuropathy in diseases classified elsewhere: Secondary | ICD-10-CM

## 2024-06-22 LAB — LIPID PANEL
Cholesterol: 146 mg/dL (ref 0–200)
HDL: 52.3 mg/dL (ref >39.00)
LDL Cholesterol: 67 mg/dL (ref 0–99)
NonHDL: 93.84
Total CHOL/HDL Ratio: 3
Triglycerides: 133 mg/dL (ref 0.0–149.0)
VLDL: 26.6 mg/dL (ref 0.0–40.0)

## 2024-06-22 LAB — HEMOGLOBIN A1C: Hgb A1c MFr Bld: 6.5 % (ref 4.6–6.5)

## 2024-06-22 LAB — VITAMIN B12: Vitamin B-12: 173 pg/mL — ABNORMAL LOW (ref 211–911)

## 2024-06-22 MED ORDER — GABAPENTIN 600 MG PO TABS
ORAL_TABLET | ORAL | Status: AC
Start: 2024-06-22 — End: ?

## 2024-06-22 MED ORDER — DULOXETINE HCL 20 MG PO CPEP
20.0000 mg | ORAL_CAPSULE | Freq: Every day | ORAL | 0 refills | Status: DC
Start: 2024-06-22 — End: 2024-09-10

## 2024-06-22 NOTE — Patient Instructions (Addendum)
  Use tylenol  arthritis as needed for pain.  Over the counter voltaren gel as needed Once dosage is confirmed of cymbalta  when you check it at home, can consider increase to 60 mg if taking 30 mg once daily.   ------------------------------------  Dr. Wallie Maranda Sherry, MD  [address] [place], [place], [place] [address]  14 mi [phone]

## 2024-06-22 NOTE — Progress Notes (Signed)
 Established Patient Office Visit  Subjective:      CC: No chief complaint on file.   HPI: Abigail [name] is a 73 y.o. female presenting on 06/22/2024 for No chief complaint on file. .  Discussed the use of AI scribe software for clinical note transcription with the patient, who gave verbal consent to proceed.  History of Present Illness Abigail [name] is a 73 year old female with neuropathy and osteoarthritis who presents with worsening foot pain.  She has a history of neuropathy diagnosed several years ago, initially presenting with numbness and pain in her right toes, which has since progressed to her left foot. Gabapentin  is no longer providing relief despite a dose of 1200 mg twice daily. She uses pain pads at night to manage discomfort. She has since decreased back to 600 mg twice daily as it was not helping at the increased dose.   She was previously seen by a podiatrist, Dr. Verta, who diagnosed her condition and referred her to a pain center where she was prescribed low-dose oxycodone , which she discontinued three years ago. She also received cortisone injections in her feet, which provided temporary relief for about a week or two.  Recently, she tried etodolac, given by a friend, which she found to be dramatically helpful, but she is concerned about potential interactions with her low-dose aspirin , which she takes due to a history of aortic valve replacement.  She is also on duloxetine  for depression, anxiety, and nerve pain, but is unsure of her current dosage.   The pain and neuropathy significantly impact her daily activities and quality of life. She reports numbness and pain in her feet, particularly in the toes. No recent nerve conduction studies or EMG tests.         Social history:  Relevant past medical, surgical, family and social history reviewed and updated as indicated. Interim medical history since our last visit reviewed.  Allergies and medications  reviewed and updated.  DATA REVIEWED: CHART IN EPIC     ROS: Negative unless specifically indicated above in HPI.    Current Outpatient Medications:    albuterol  (VENTOLIN  HFA) 108 (90 Base) MCG/ACT inhaler, Inhale 2 puffs into the lungs every 4 (four) hours as needed for wheezing or shortness of breath., Disp: 1 each, Rfl: 1   amoxicillin  (AMOXIL ) 500 MG tablet, Take 4 tablets (2,000 mg total) by mouth as directed. 1 HOUR PRIOR TO DENTAL APPOINTMENTS, Disp: 12 tablet, Rfl: 5   aspirin  81 MG chewable tablet, Chew 1 tablet (81 mg total) by mouth daily., Disp: , Rfl:    Azelastine HCl 137 MCG/SPRAY SOLN, Place 1 spray into both nostrils daily as needed (allergies/sinus issues.)., Disp: , Rfl:    budesonide -formoterol  (SYMBICORT ) 80-4.5 MCG/ACT inhaler, Inhale 2 puffs into the lungs in the morning and at bedtime., Disp: 1 each, Rfl: 12   COSENTYX  SENSOREADY, 300 MG, 150 MG/ML SOAJ, Inject 300 mg into the skin every 30 (thirty) days., Disp: , Rfl:    diltiazem  (CARDIZEM  CD) 240 MG 24 hr capsule, Take 1 capsule (240 mg total) by mouth daily., Disp: 90 capsule, Rfl: 3   Evolocumab  (REPATHA  SURECLICK) 140 MG/ML SOAJ, Inject 140 mg [place] every two weeks, Disp: 2 mL, Rfl: 5   fluticasone  (FLONASE ) 50 MCG/ACT nasal spray, Place 1 spray into both nostrils 2 (two) times daily., Disp: 16 g, Rfl: 2   furosemide  (LASIX ) 20 MG tablet, Take 1 tablet (20 mg total) by mouth daily., Disp: 90 tablet,  Rfl: 3   glucose blood (FREESTYLE LITE) test strip, Use as instructed to monitor FSBS 1x daily. Dx: E11.9, Disp: 100 each, Rfl: 1   glucose monitoring kit (FREESTYLE) monitoring kit, 1 each by Does not apply route as needed for other. Dispense one glucometer of choice, testing strips/lancets for once per day glucose checks.  QS for 1 month, 11 refills., Disp: 1 each, Rfl: 0   Lancets (FREESTYLE) lancets, Use as instructed, Disp: 100 each, Rfl: 12   lisinopril  (ZESTRIL ) 10 MG tablet, Take 10 mg by mouth every evening.,  Disp: , Rfl:    meclizine  (ANTIVERT ) 12.5 MG tablet, Take 1 tablet (12.5 mg total) by mouth 3 (three) times daily as needed for dizziness., Disp: 30 tablet, Rfl: 0   mometasone (NASONEX) 50 MCG/ACT nasal spray, Place 2 sprays into the nose daily as needed (allergies)., Disp: , Rfl:    Semaglutide , 2 MG/DOSE, (OZEMPIC , 2 MG/DOSE,) 8 MG/3ML SOPN, Inject 2mg  into the skin once a week, Disp: 9 mL, Rfl: 0   SKYRIZI PEN 150 MG/ML pen, , Disp: , Rfl:    gabapentin  (NEURONTIN ) 600 MG tablet, TAKE 1 TABLET by MOUTH IN THE MORNING AND AT NIGHT, Disp: , Rfl:         Objective:        BP 120/64   Pulse 93   Temp 98.6 F (37 C) (Oral)   Ht 5' 2 (1.575 m)   Wt 170 lb (77.1 kg)   SpO2 96%   BMI 31.09 kg/m   Physical Exam CARDIOVASCULAR: Heart sounds normal. NEUROLOGICAL: Decreased sensation in feet.  Wt Readings from Last 3 Encounters:  06/22/24 170 lb (77.1 kg)  02/27/24 178 lb 6.4 oz (80.9 kg)  02/25/24 178 lb (80.7 kg)    Physical Exam Vitals reviewed.  Constitutional:      General: She is not in acute distress.    Appearance: Normal appearance. She is normal weight. She is not ill-appearing, toxic-appearing or diaphoretic.  HENT:     Head: Normocephalic.  Cardiovascular:     Rate and Rhythm: Normal rate and regular rhythm.     Pulses:          Popliteal pulses are 2+ on the right side and 2+ on the left side.       Dorsalis pedis pulses are 2+ on the right side and 2+ on the left side.  Pulmonary:     Effort: Pulmonary effort is normal.  Musculoskeletal:        General: Normal range of motion.     Right lower leg: No edema.     Left lower leg: No edema.  Neurological:     General: No focal deficit present.     Mental Status: She is alert and oriented to person, place, and time. Mental status is at baseline.  Psychiatric:        Mood and Affect: Mood normal.        Behavior: Behavior normal.        Thought Content: Thought content normal.        Judgment: Judgment  normal.     Title   Diabetic Foot Exam - detailed Is there a history of foot ulcer?: No Is there a foot ulcer now?: No Is there swelling?: No Is there elevated skin temperature?: No Is there abnormal foot shape?: No Is there a claw toe deformity?: No Are the toenails long?: No Are the toenails thick?: No Are the toenails ingrown?: No Is the skin  thin, fragile, shiny and hairless?: No Normal Range of Motion?: Yes Is there foot or ankle muscle weakness?: No Do you have pain in calf while walking?: No Are the shoes appropriate in style and fit?: Yes Can the patient see the bottom of their feet?: Yes Pulse Foot Exam completed.: Yes   Right Posterior Tibialis: Present Left posterior Tibialis: Present   Right Dorsalis Pedis: Present Left Dorsalis Pedis: Present     Semmes-Weinstein Monofilament Test + means has sensation and - means no sensation  R Foot Test Control: Pos L Foot Test Control: Pos   R Site 1-Great Toe: Neg L Site 1-Great Toe: Neg   R Site 4: Pos L Site 4: Neg   R site 5: Pos L Site 5: Neg  R Site 6: Pos L Site 6: Neg     Image components are not supported.   Image components are not supported. Image components are not supported.  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 Gabapentin  at current dose not effective. Previous treatment with oxycodone  discontinued. Cortisone injections provide temporary relief. Etodolac provided significant relief but poses risks due to concurrent aspirin  use and CHF. Consideration of  increasing duloxetine  for nerve pain management. Risks of NSAIDs include GI bleed and fluid retention, especially with CHF and concurrent aspirin  use. - Reduce gabapentin  to 600 mg in the morning and 600 mg at night. - Confirm current dose of duloxetine  (Cymbalta ) and consider increasing if currently at 30 mg. - Use Tylenol  arthritis as needed for pain. - Apply Voltaren gel as needed. - Refer to neurology for nerve conduction study. - Ensure B12 supplementation is continued. - Monitor A1c to ensure diabetes control.  Osteoarthritis of feet Contributing to foot pain along with neuropathy. Etodolac provided relief but not suitable due to risks with aspirin  and CHF. - Continue current management with Tylenol  arthritis and Voltaren gel as needed. - Consider increasing duloxetine  if current dose is 30 mg.  Congestive heart failure with mechanical aortic valve replacement On low-dose aspirin  for anticoagulation. Etodolac not recommended due to potential for fluid retention and interaction with aspirin . - Continue low-dose aspirin  therapy. - Avoid NSAIDs like etodolac due to risk of GI bleed and fluid retention.  Vitamin B12 deficiency anemia B12 supplementation ongoing. Important to maintain to prevent exacerbation of neuropathy. - Continue B12 supplementation. - Recheck B12 levels as needed.  Major depressive disorder Currently on duloxetine  (Cymbalta ) 30 mg, which also aids in nerve pain management. Potential to increase dose for better management of depression and neuropathy. - Confirm current dose of duloxetine  and consider increasing if at 30 mg.  Recording duration: 23 minutes      Return in about 3 months (around 09/22/2024) for follow up neuropathy .     Ginger Patrick, MSN, APRN, FNP-C [facility]

## 2024-06-22 NOTE — Telephone Encounter (Signed)
 Copied from CRM [id number]. Topic: Clinical - Prescription Issue >> Jun 22, 2024 12:32 PM Pinkey ORN wrote: Reason for CRM: CYMBALTA  >> Jun 22, 2024 12:33 PM Pinkey ORN wrote: Patient states she needs a prescription order sent into the pharmacy. Patient would like a call back to follow up on this, call back number is [phone]

## 2024-06-22 NOTE — Addendum Note (Signed)
 Addended by: CORWIN ANTU on: 06/22/2024 01:22 PM   Modules accepted: Orders

## 2024-06-22 NOTE — Telephone Encounter (Signed)
 Spoke with pt and she states that she is not currently taking any Cymbalta . She was taking 30mg  daily a few months ago but has not had it in a while. Pt would like to have a prescription sent in if possible.

## 2024-06-24 ENCOUNTER — Ambulatory Visit: Payer: Self-pay | Admitting: Family

## 2024-06-29 ENCOUNTER — Other Ambulatory Visit

## 2024-06-29 ENCOUNTER — Ambulatory Visit (INDEPENDENT_AMBULATORY_CARE_PROVIDER_SITE_OTHER): Admitting: Gastroenterology

## 2024-06-29 ENCOUNTER — Encounter: Payer: Self-pay | Admitting: Gastroenterology

## 2024-06-29 VITALS — BP 120/68 | HR 73 | Ht 62.0 in | Wt 172.4 lb

## 2024-06-29 DIAGNOSIS — R197 Diarrhea, unspecified: Secondary | ICD-10-CM

## 2024-06-29 DIAGNOSIS — Z8719 Personal history of other diseases of the digestive system: Secondary | ICD-10-CM | POA: Insufficient documentation

## 2024-06-29 MED ORDER — HYOSCYAMINE SULFATE 0.125 MG SL SUBL: 0.1250 mg | SUBLINGUAL_TABLET | Freq: Four times a day (QID) | SUBLINGUAL | 1 refills | Status: AC | PRN

## 2024-06-29 NOTE — Patient Instructions (Signed)
 Your provider has requested that you go to the basement level for lab work before leaving today. Press B on the elevator. The lab is located at the first door on the left as you exit the elevator.  We have sent the following medications to your pharmacy for you to pick up at your convenience: Levsin  0.125 mg every 6 hours as needed.   _______________________________________________________  If your blood pressure at your visit was 140/90 or greater, please contact your primary care physician to follow up on this.  _______________________________________________________  If you are age 75 or older, your body mass index should be between 23-30. Your Body mass index is 31.53 kg/m. If this is out of the aforementioned range listed, please consider follow up with your Primary Care Provider.  If you are age 31 or younger, your body mass index should be between 19-25. Your Body mass index is 31.53 kg/m. If this is out of the aformentioned range listed, please consider follow up with your Primary Care Provider.   ________________________________________________________  The [facility] providers would like to encourage you to use MYCHART to communicate with providers for non-urgent requests or questions.  Due to long hold times on the telephone, sending your provider a message by [facility] may be a faster and more efficient way to get a response.  Please allow 48 business hours for a response.  Please remember that this is for non-urgent requests.  _______________________________________________________  [facility] is using a team-based approach to care.  Your team is made up of your doctor and two to three APPS. Our APPS (Nurse Practitioners and Physician Assistants) work with your physician to ensure care continuity for you. They are fully qualified to address your health concerns and develop a treatment plan. They communicate directly with your gastroenterologist to care for you. Seeing  the Advanced Practice Practitioners on your physician's team can help you by facilitating care more promptly, often allowing for earlier appointments, access to diagnostic testing, procedures, and other specialty referrals.

## 2024-06-29 NOTE — Progress Notes (Signed)
 06/29/2024 Abigail [name] [id number] Mar 08, 1951   HISTORY OF PRESENT ILLNESS: This is a 73 year old female who is a patient of Dr. Trenna.  She usually follows here for upper GI issues with dysphagia and esophageal stricture, esophageal candidiasis, EOE.  Today thought she is here here with complaints of about 3 weeks of diarrhea and nausea with a dull stomachache.  Has a history of IBS, but reports historically has had constipation.  Did have a prescription of Levsin  dated from 2017 that she says has seemed to help her and is asking for a new prescription for that.  She is on Repatha , Ozempic , and was on Cosentyx  (although has been off of that for about a month now and is going to start Skyrizi soon for her psoriasis), but has been on all of her medications for 8 months or more now.  Otherwise she did take amoxicillin  and cefdinir  in April for some sinus issues.  Colonoscopy August 2019 with removal of transverse colon adenomatous polyp, sigmoid diverticulosis and internal hemorrhoids.  Recall 7 years.  Past Medical History:  Diagnosis Date   Allergy    Anxiety    Arthritis    hands, knees   Cancer (HCC)    Cataract    surgery to remove -bilateral   COPD (chronic obstructive pulmonary disease) (HCC)    Depression    Diabetes mellitus (HCC)    a. A1c 6.6 in 05/2013 indicating new dx.   Fatty liver    Female stress incontinence    GERD (gastroesophageal reflux disease)    Heart murmur    no problems   Hemorrhoids    History of kidney stones    surgery to remove   Hyperlipidemia    Hyperplastic colon polyp 06/07/2008   Hypertension    Hypertriglyceridemia    IBS (irritable bowel syndrome)    Lung nodule    a. 4mm left lung nodule by CT 05/2013.   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 Wonda and Dr. Maryjane   SCCA (squamous cell carcinoma)  of skin 01/16/2022   Left Malar Cheek (in situ) (tx p bx)   Skin cancer    Squamous cell carcinoma of skin 01/16/2022   Right Breast (in situ) (curet and 5FU)   Past Surgical History:  Procedure Laterality Date   CATARACT EXTRACTION W/PHACO Left 10/06/2014   Procedure: CATARACT EXTRACTION PHACO AND INTRAOCULAR LENS PLACEMENT LEFT EYE;  Surgeon: Cherene Mania, MD;  Location: [facility];  Service: Ophthalmology;  Laterality: Left;  CDE 7.35   CATARACT EXTRACTION W/PHACO Right 11/14/2014   Procedure: CATARACT EXTRACTION PHACO AND INTRAOCULAR LENS PLACEMENT RIGHT EYE;  Surgeon: Cherene Mania, MD;  Location: [facility];  Service: Ophthalmology;  Laterality: Right;  CDE:6.39   COLONOSCOPY  06/2008   hx polyps   CYSTOSCOPY W/ RETROGRADES Right 01/21/2017   Procedure: CYSTOSCOPY WITH RETROGRADE PYELOGRAM;  Surgeon: Rosina Riis, MD;  Location: [facility];  Service: Urology;  Laterality: Right;   CYSTOSCOPY/URETEROSCOPY/HOLMIUM LASER/STENT PLACEMENT Left 01/21/2017   Procedure: CYSTOSCOPY/URETEROSCOPY/HOLMIUM LASER/STENT PLACEMENT;  Surgeon: Rosina Riis, MD;  Location: [facility];  Service: Urology;  Laterality: Left;   DORSAL COMPARTMENT RELEASE Left 06/15/2015   Procedure: LEFT WRIST DEQUERVAINS;  Surgeon: Toribio JULIANNA Chancy, MD;  Location: [facility];  Service: Orthopedics;  Laterality: Left;   HERNIA REPAIR  INTRAOPERATIVE TRANSTHORACIC ECHOCARDIOGRAM N/A 02/05/2023   Procedure: INTRAOPERATIVE TRANSTHORACIC ECHOCARDIOGRAM;  Surgeon: Wonda Sharper, MD;  Location: [facility] INVASIVE CV LAB;  Service: Open Heart Surgery;  Laterality: N/A;   MOUTH SURGERY     tooth ext   NASAL SINUS SURGERY     RIGHT/LEFT HEART CATH AND CORONARY ANGIOGRAPHY N/A 01/08/2023   Procedure: RIGHT/LEFT HEART CATH AND CORONARY ANGIOGRAPHY;  Surgeon: Wonda Sharper, MD;  Location: [facility] INVASIVE CV LAB;  Service: Cardiovascular;  Laterality: N/A;   SHOULDER SURGERY     right   TONSILLECTOMY     TRANSCATHETER AORTIC VALVE REPLACEMENT,  TRANSFEMORAL Right 02/05/2023   Procedure: Transcatheter Aortic Valve Replacement, Transfemoral;  Surgeon: Wonda Sharper, MD;  [facility] INV[facility] CV LAB;  Service: Open Heart Surgery;  Laterality: Right;   UPPER GI ENDOSCOPY     normal per patient   VENTRAL HERNIA REPAIR      reports that she has never smoked. She has never used smokeless tobacco. She reports that she does not drink alcohol and does not use drugs. family history includes Breast cancer in her mother; Colon polyps in her maternal aunt; Heart disease in her maternal grandfather. Allergies  Allergen Reactions   Statins Other (See Comments)    Failed simvastatin, pravastatin , and rosuvastatin  5 mg weekly (myalgias)   Cinnamon     Dry throat    Farxiga  [Dapagliflozin ] Other (See Comments)    Developed yeast infection      Outpatient Encounter Medications as of 06/29/2024  Medication Sig   albuterol  (VENTOLIN  HFA) 108 (90 Base) MCG/ACT inhaler Inhale 2 puffs into the lungs every 4 (four) hours as needed for wheezing or shortness of breath.   amoxicillin  (AMOXIL ) 500 MG tablet Take 4 tablets (2,000 mg total) by mouth as directed. 1 HOUR PRIOR TO DENTAL APPOINTMENTS   aspirin  81 MG chewable tablet Chew 1 tablet (81 mg total) by mouth daily.   Azelastine HCl 137 MCG/SPRAY SOLN Place 1 spray into both nostrils daily as needed (allergies/sinus issues.).   budesonide -formoterol  (SYMBICORT ) 80-4.5 MCG/ACT inhaler Inhale 2 puffs into the lungs in the morning and at bedtime.   diltiazem  (CARDIZEM  CD) 240 MG 24 hr capsule Take 1 capsule (240 mg total) by mouth daily.   DULoxetine  (CYMBALTA ) 20 MG capsule Take 1 capsule (20 mg total) by mouth daily.   Evolocumab  (REPATHA  SURECLICK) 140 MG/ML SOAJ Inject 140 mg  every two weeks   fluticasone  (FLONASE ) 50 MCG/ACT nasal spray Place 1 spray into both nostrils 2 (two) times daily.   furosemide  (LASIX ) 20 MG tablet Take 1 tablet (20 mg total) by mouth daily.   gabapentin  (NEURONTIN ) 600  MG tablet TAKE 1 TABLET by MOUTH IN THE MORNING AND AT NIGHT   glucose blood (FREESTYLE LITE) test strip Use as instructed to monitor FSBS 1x daily. Dx: E11.9   glucose monitoring kit (FREESTYLE) monitoring kit 1 each by Does not apply route as needed for other. Dispense one glucometer of choice, testing strips/lancets for once per day glucose checks.  QS for 1 month, 11 refills.   Lancets (FREESTYLE) lancets Use as instructed   lisinopril  (ZESTRIL ) 10 MG tablet Take 10 mg by mouth every evening.   meclizine  (ANTIVERT ) 12.5 MG tablet Take 1 tablet (12.5 mg total) by mouth 3 (three) times daily as needed for dizziness.   mometasone (NASONEX) 50 MCG/ACT nasal spray Place 2 sprays into the nose daily as needed (allergies).   Semaglutide , 2 MG/DOSE, (OZEMPIC , 2 MG/DOSE,) 8 MG/3ML SOPN Inject 2mg   into the skin once a week   SKYRIZI PEN 150 MG/ML pen    [DISCONTINUED] COSENTYX  SENSOREADY, 300 MG, 150 MG/ML SOAJ Inject 300 mg into the skin every 30 (thirty) days.   No facility-administered encounter medications on file as of 06/29/2024.    REVIEW OF SYSTEMS  : All other systems reviewed and negative except where noted in the History of Present Illness.   PHYSICAL EXAM: BP 120/68   Pulse 73   Ht 5' 2 (1.575 m)   Wt 172 lb 6.4 oz (78.2 kg)   BMI 31.53 kg/m  General: Well developed white female in no acute distress Head: Normocephalic and atraumatic Eyes:  Sclerae anicteric, conjunctiva pink. Ears: Normal auditory acuity Lungs: Clear throughout to auscultation; no W/R/R. Heart: Regular rate and rhythm; no M/R/G. Abdomen: Soft, non-distended.  BS present.  Mild diffuse TTP. Musculoskeletal: Symmetrical with no gross deformities  Skin: No lesions on visible extremities Extremities: No edema  Neurological: Alert oriented x 4, grossly non-focal Psychological:  Alert and cooperative. Normal mood and affect  ASSESSMENT AND PLAN: *73 year old female with complaints of about 3 weeks of diarrhea  and nausea with a dull stomachache.  Has a history of IBS, but reports historically has had constipation.  Did have a prescription of Levsin  dated from 2017 that she says has seemed to help her and is asking for a new prescription for that.  I am happy to refill that for her.  This could be IBS.  Could be medication side effect as she is on Repatha , Ozempic , and was on Cosentyx  (although has been off of that for about a month now and is going to start Skyrizi soon for her psoriasis).  Otherwise she did take amoxicillin  and cefdinir  in April for some sinus issues.  Symptoms not severe at this point.  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  Will send a prescription for her Levsin  to her pharmacy.  I have ordered stool studies for C. difficile and stool culture to have her perform if symptoms continue or certainly if they worsen.   CC:  Corwin Antu, FNP

## 2024-07-01 ENCOUNTER — Ambulatory Visit

## 2024-07-06 ENCOUNTER — Ambulatory Visit (INDEPENDENT_AMBULATORY_CARE_PROVIDER_SITE_OTHER)

## 2024-07-06 DIAGNOSIS — D513 Other dietary vitamin B12 deficiency anemia: Secondary | ICD-10-CM | POA: Diagnosis not present

## 2024-07-06 MED ORDER — CYANOCOBALAMIN 1000 MCG/ML IJ SOLN
1000.0000 ug | Freq: Once | INTRAMUSCULAR | Status: AC
  Administered 2024-07-06: 1000 ug via INTRAMUSCULAR

## 2024-07-06 NOTE — Progress Notes (Signed)
 Per orders of Ginger Patrick, NP, injection of Vitamin b 12 given by Laray Arenas in right deltoid. Patient tolerated injection well. Patient will make appointment for 1 month.

## 2024-07-09 ENCOUNTER — Encounter

## 2024-08-05 ENCOUNTER — Ambulatory Visit

## 2024-08-10 ENCOUNTER — Ambulatory Visit

## 2024-08-10 DIAGNOSIS — E538 Deficiency of other specified B group vitamins: Secondary | ICD-10-CM

## 2024-08-10 MED ORDER — CYANOCOBALAMIN 1000 MCG/ML IJ SOLN
1000.0000 ug | Freq: Once | INTRAMUSCULAR | Status: AC
  Administered 2024-08-10: 1000 ug via INTRAMUSCULAR

## 2024-08-10 NOTE — Progress Notes (Signed)
 Per orders of Ginger Patrick, NP, injection of B-12 #2 given by Nellie Hummer in right deltoid. Patient tolerated injection well. Patient will make appointment for 1 month.

## 2024-08-31 ENCOUNTER — Encounter: Payer: Self-pay | Admitting: Student in an Organized Health Care Education/Training Program

## 2024-08-31 ENCOUNTER — Ambulatory Visit
Attending: Student in an Organized Health Care Education/Training Program | Admitting: Student in an Organized Health Care Education/Training Program

## 2024-08-31 VITALS — BP 156/67 | HR 82 | Temp 97.3°F | Resp 16 | Ht 62.0 in | Wt 165.0 lb

## 2024-08-31 DIAGNOSIS — G894 Chronic pain syndrome: Secondary | ICD-10-CM | POA: Insufficient documentation

## 2024-08-31 DIAGNOSIS — E1140 Type 2 diabetes mellitus with diabetic neuropathy, unspecified: Secondary | ICD-10-CM | POA: Diagnosis not present

## 2024-08-31 DIAGNOSIS — M792 Neuralgia and neuritis, unspecified: Secondary | ICD-10-CM | POA: Insufficient documentation

## 2024-08-31 DIAGNOSIS — Z7985 Long-term (current) use of injectable non-insulin antidiabetic drugs: Secondary | ICD-10-CM | POA: Diagnosis not present

## 2024-08-31 NOTE — Progress Notes (Signed)
 PROVIDER NOTE: Interpretation of information contained herein should be left to medically-trained personnel. Specific patient instructions are provided elsewhere under Patient Instructions section of medical record. This document was created in part using AI and STT-dictation technology, any transcriptional errors that may result from this process are unintentional.  Patient: Abigail [name]  Service: E/M Encounter  Provider: Wallie Sherry, MD  DOB: 11-30-1950  Delivery: Face-to-face  Specialty: Interventional Pain Management  MRN: [id number]  Setting: Ambulatory outpatient facility  Specialty designation: 09  Type: New Patient  Location: Outpatient office facility  PCP: Corwin Antu, FNP  DOS: 08/31/2024    Referring Prov.: Hyatt, Max T, DPM   Primary Reason(s) for Visit: Encounter for initial evaluation of one or more chronic problems (new to examiner) potentially causing chronic pain, and posing a threat to normal musculoskeletal function. (Level of risk: High) CC: Foot Pain (Bilateral  )  HPI  Abigail [name] is a 73 y.o. year old, female patient, who comes for the first time to our practice referred by Verta Royden DASEN, DPM for our initial evaluation of her chronic pain. She has HLD (hyperlipidemia); Essential hypertension; Hemorrhoids; GERD; IRRITABLE BOWEL SYNDROME; FATTY LIVER DISEASE; Osteoarthritis; Diarrhea; STRESS INCONTINENCE; Type 2 diabetes mellitus with neurological complications (HCC); Diabetic neuropathy (HCC); Aortic valve sclerosis; Upper airway cough syndrome; Dysphagia; Eosinophilic esophagitis; Adrenal adenoma, left; Hepatic steatosis; Recurrent pansinusitis; Heart murmur; Psoriasis; DOE (dyspnea on exertion); Pulmonary nodule less than 6 mm determined by computed tomography of lung; Simvastatin induced neuropathy; Statin intolerance; Severe aortic stenosis; Mitral stenosis; Hypertrophic cardiomyopathy (HCC); Primary osteoarthritis of both first carpometacarpal joints; Anemia; S/P TAVR  (transcatheter aortic valve replacement); Left carotid bruit; Numbness and tingling of both upper extremities; Pulmonary vascular congestion; Pulsatile tinnitus of left ear; Cervical radiculopathy; Bilateral hearing loss; Excessive daytime sleepiness; Chronic diastolic heart failure (HCC); Non-recurrent acute suppurative otitis media of left ear with spontaneous rupture of tympanic membrane; Nasal polyps; Seasonal allergic rhinitis due to pollen; and History of IBS on their problem list. Today she comes in for evaluation of her Foot Pain (Bilateral  )  Pain Assessment: Location: Right, Left Foot Radiating: sometimes will go up to calves Onset: More than a month ago Duration: Chronic pain Quality: Aching, Constant, Tiring, Spasm, Discomfort, Grimacing Severity: 8 /10 (subjective, self-reported pain score)  Effect on ADL: ADL's, prolonged walking and standing Timing: Constant Modifying factors: pain patches on bottom of feet, rest BP: (!) 156/67  HR: 82  Onset and Duration: Gradual Cause of pain: Unknown Severity: Getting worse, NAS-11 at its worse: 10/10, NAS-11 at its best: 10/10, NAS-11 now: 7/10, and NAS-11 on the average: 7/10 Timing: Not influenced by the time of the day, During activity or exercise, and After activity or exercise Aggravating Factors: Climbing, Prolonged standing, Twisting, and Walking Alleviating Factors: Cold packs, Hot packs, Resting, and Warm showers or baths Associated Problems: Dizziness, Fatigue, Numbness, Spasms, Tingling, Weakness, and Pain that does not allow patient to sleep Quality of Pain: Aching, Burning, Getting longer, Nagging, and Numb Previous Examinations or Tests: CT scan, Epidurogram, and Neurological evaluation Previous Treatments: Relaxation therapy  Abigail [name] is being evaluated for possible interventional pain management therapies for the treatment of her chronic pain.   Discussed the use of AI scribe software for clinical note transcription  with the patient, who gave verbal consent to proceed.  History of Present Illness   Abigail [name] is a 73 year old female who presents with bilateral foot pain. [facility]  for evaluation of her neuropathy related to type II DM.  She experiences severe pain in both feet, initially worse in the right foot but now equally affecting both. The pain is intense enough to require the use of pain pads both at night and during the day to manage symptoms. Without these pads, she is unable to sleep due to the intensity of the pain.  She has a history of using gabapentin  for her neuropathy, but higher doses resulted in sedation and a 'foggy' feeling in her head. She attempted to reduce the dose to one pill in the morning and one at night, but noticed no significant difference in symptom relief compared to the higher dose.  She has also used a topical cream, similar to the pain pads, which contains 2% of an unspecified active ingredient, but she cannot recall the name of the product.  She experiences episodes of itching inside her feet, which can last two to three days and are sometimes alleviated by applying ice, although this is not always effective. These episodes can last two to three days and are sometimes alleviated by applying ice, although this is not always effective.  She mentions a recent incident where she irritated a nerve in her arm, resulting in severe pain for a week, which has since improved but still occasionally bothers her.  She also reports experiencing shaking, which she questions if it is related to her neuropathy.       Meds   Current Outpatient Medications:    albuterol  (VENTOLIN  HFA) 108 (90 Base) MCG/ACT inhaler, Inhale 2 puffs into the lungs every 4 (four) hours as needed for wheezing or shortness of breath., Disp: 1 each, Rfl: 1   aspirin  81 MG chewable tablet, Chew 1 tablet (81 mg total) by mouth daily., Disp: , Rfl:    Azelastine HCl 137  MCG/SPRAY SOLN, Place 1 spray into both nostrils daily as needed (allergies/sinus issues.)., Disp: , Rfl:    budesonide -formoterol  (SYMBICORT ) 80-4.5 MCG/ACT inhaler, Inhale 2 puffs into the lungs in the morning and at bedtime., Disp: 1 each, Rfl: 12   diltiazem  (CARDIZEM  CD) 240 MG 24 hr capsule, Take 1 capsule (240 mg total) by mouth daily., Disp: 90 capsule, Rfl: 3   DULoxetine  (CYMBALTA ) 20 MG capsule, Take 1 capsule (20 mg total) by mouth daily., Disp: 90 capsule, Rfl: 0   Evolocumab  (REPATHA  SURECLICK) 140 MG/ML SOAJ, Inject 140 mg [place] every two weeks, Disp: 2 mL, Rfl: 5   fluticasone  (FLONASE ) 50 MCG/ACT nasal spray, Place 1 spray into both nostrils 2 (two) times daily., Disp: 16 g, Rfl: 2   furosemide  (LASIX ) 20 MG tablet, Take 1 tablet (20 mg total) by mouth daily., Disp: 90 tablet, Rfl: 3   gabapentin  (NEURONTIN ) 600 MG tablet, TAKE 1 TABLET by MOUTH IN THE MORNING AND AT NIGHT (Patient taking differently: 300 mg. TAKE 1 TABLET by MOUTH IN THE MORNING AND AT NIGHT  takes 300mg  in am and pm), Disp: , Rfl:    glucose blood (FREESTYLE LITE) test strip, Use as instructed to monitor FSBS 1x daily. Dx: E11.9, Disp: 100 each, Rfl: 1   glucose monitoring kit (FREESTYLE) monitoring kit, 1 each by Does not apply route as needed for other. Dispense one glucometer of choice, testing strips/lancets for once per day glucose checks.  QS for 1 month, 11 refills., Disp: 1 each, Rfl: 0   hyoscyamine  (LEVSIN  SL) 0.125 MG SL tablet, Place 1 tablet (0.125 mg total) under the tongue every 6 (six)  hours as needed., Disp: 30 tablet, Rfl: 1   Lancets (FREESTYLE) lancets, Use as instructed, Disp: 100 each, Rfl: 12   lisinopril  (ZESTRIL ) 10 MG tablet, Take 10 mg by mouth every evening., Disp: , Rfl:    meclizine  (ANTIVERT ) 12.5 MG tablet, Take 1 tablet (12.5 mg total) by mouth 3 (three) times daily as needed for dizziness., Disp: 30 tablet, Rfl: 0   mometasone (NASONEX) 50 MCG/ACT nasal spray, Place 2 sprays into the  nose daily as needed (allergies)., Disp: , Rfl:    Semaglutide , 2 MG/DOSE, (OZEMPIC , 2 MG/DOSE,) 8 MG/3ML SOPN, Inject 2mg  into the skin once a week, Disp: 9 mL, Rfl: 0   SKYRIZI PEN 150 MG/ML pen, , Disp: , Rfl:    amoxicillin  (AMOXIL ) 500 MG tablet, Take 4 tablets (2,000 mg total) by mouth as directed. 1 HOUR PRIOR TO DENTAL APPOINTMENTS (Patient not taking: Reported on 08/31/2024), Disp: 12 tablet, Rfl: 5  Imaging Review   DG Cervical Spine Complete  Narrative CLINICAL DATA:  Cervical radiculopathy.  EXAM: CERVICAL SPINE - COMPLETE 4+ VIEW  COMPARISON:  Cervical spine radiograph dated 07/09/2013.  FINDINGS: No acute fracture or subluxation of the cervical spine. The bones are osteopenic. Multilevel bilateral neural foramina narrowing. The visualized posterior elements and odontoid appear intact. There is anatomic alignment of the lateral masses of C1 and C2. The soft tissues are unremarkable.  IMPRESSION: 1. No acute findings. 2. Multilevel bilateral neural foramina narrowing.   Electronically Signed By: Vanetta Chou M.D. On: 01/13/2024 12:57  MR LUMBAR SPINE WO CONTRAST  Narrative CLINICAL DATA:  Numbness in both feet, hands and wrists for 1.5 years. No known injury.  EXAM: MRI LUMBAR SPINE WITHOUT CONTRAST  TECHNIQUE: Multiplanar, multisequence MR imaging of the lumbar spine was performed. No intravenous contrast was administered.  COMPARISON:  CT abdomen and pelvis 01/14/2017.  FINDINGS: Segmentation:  Standard.  Alignment: No listhesis. Convex right scoliosis with the apex at L2 is noted.  Vertebrae: No fracture or worrisome lesion. There is some degenerative endplate signal change anteriorly at L2-3.  Conus medullaris: Extends to the T12-L1 level and appears normal.  Paraspinal and other soft tissues: Negative.  Disc levels:  T11-12 and T12-L1 are imaged in the sagittal plane only and negative.  L1-2: Mild facet degenerative change is seen  on the left. No disc bulge or protrusion. The central canal and foramina are open.  L2-3: Shallow disc bulge causes mild central canal and and left subarticular recess narrowing. The foramina are open.  L3-4: No disc bulge or protrusion. Mild to moderate facet arthropathy. The central canal and foramina are open.  L4-5: Minimal disc bulge and mild facet degenerative change. The central canal and foramina are open.  L5-S1: Right paravertebral broad-based disc bulge and endplate spur extend into the periphery of the right foramen. Disc contacts the undersurface of the exiting right L5 root without compression or displacement. The central canal and left foramen are open.  IMPRESSION: Mild central canal and left subarticular recess narrowing L2-3 due to a shallow disc bulge.  Disc contacts the undersurface of the exiting right L5 root at L5-S1 without compression or displacement.  Convex right scoliosis.   Electronically Signed By: Debby Prader M.D. On: 03/28/2017 13:42  DG Lumbar Spine Complete  Narrative Clinical Data: Headaches, posterior neck pain, low back pain.  CERVICAL SPINE, FIVE VIEWS Alignment is normal. There is mild spondylosis at C5-6 with small osteophytes that do not grossly impinge upon the spinal canal or neural  foramina. Facets on the left appear normal. On the right, there are mild facet degenerative changes at C6-7.  IMPRESSION Negative except for mild spondylosis at C5-6 and mild facet degeneration on the right at C6-7. No osteophytic encroachment of any significance seen upon the canal or neural foramina. No upper cervical malalignment or facet disease seen to explain the cervicogenic headache.  LUMBAR SPINE, FOUR VIEWS There is thoracolumbar scoliosis convex to the right with the apex at L1. There is disk space narrowing at L2-3 with leftward projecting osteophytes. Other disk space heights appear within normal limits. No significant facet  degenerative disease. No sign of pars defects or slippage. SI joints appear unremarkable.  IMPRESSION Thoracolumbar scoliosis convex to the right with evidence of degenerative disk disease at L2-3.  jst  Provider: Milderd Mari  Narrative CLINICAL DATA:  Left knee pain for 10 years.  No recent injury.  EXAM: BILATERAL KNEES STANDING - 1 VIEW; LEFT KNEE - 3 VIEW  COMPARISON:  None.  FINDINGS: Moderate medial compartment joint space narrowing in the left knee is seen with standing. There is some osteophytosis about the medial and patellofemoral compartments. No fracture or dislocation. No joint effusion.  IMPRESSION: Moderate to moderately severe medial compartment osteoarthritis left knee.   Electronically Signed By: Debby Prader M.D. On: 12/11/2016 14:58   Narrative Please see detailed radiograph report in office note.  Foot-L DG Complete: Results for orders placed in visit on 10/04/15  DG Foot Complete Left  Narrative 3 views of the left foot demonstrates osseously mature foot with mild osteopenia. Plantar distally oriented calcaneal heel spur. Soft tissue thickening of the peroneal brevis tendon left. Appears to be a small fracture at the styloid process of the fifth metatarsal base. It is nondisplaced and non-comminuted.     Complexity Note: Imaging results reviewed.                         ROS  Cardiovascular: Abnormal heart rhythm, Heart murmur, and Heart valve problems Pulmonary or Respiratory: Shortness of breath and Coughing up mucus (Bronchitis) Neurological: Abnormal skin sensations (Peripheral Neuropathy) Psychological-Psychiatric: No reported psychological or psychiatric signs or symptoms such as difficulty sleeping, anxiety, depression, delusions or hallucinations (schizophrenial), mood swings (bipolar disorders) or suicidal ideations or attempts Gastrointestinal: Reflux or heatburn and Alternating episodes iof diarrhea and constipation  (IBS-Irritable bowe syndrome) Genitourinary: Passing kidney stones Hematological: Brusing easily and Bleeding easily Endocrine: High blood sugar requiring insulin  (IDDM) Rheumatologic: Rheumatoid arthritis Musculoskeletal: Negative for myasthenia gravis, muscular dystrophy, multiple sclerosis or malignant hyperthermia Work History: Retired  Allergies  Abigail [name] is allergic to statins, cinnamon, and farxiga  [dapagliflozin ].  Laboratory Chemistry Profile   Renal Lab Results  Component Value Date   BUN 9 03/30/2024   CREATININE 0.67 03/30/2024   LABCREA 152 12/01/2020   BCR NOT APPLICABLE 11/16/2021   GFR 79.61 01/13/2024   GFRAA 76 07/09/2019   GFRNONAA >60 03/30/2024   SPECGRAV 1.015 02/03/2017   PHUR 5.5 02/03/2017   PROTEINUR NEGATIVE 02/03/2023     Electrolytes Lab Results  Component Value Date   NA 142 03/30/2024   K 3.8 03/30/2024   CL 109 03/30/2024   CALCIUM  8.6 (L) 03/30/2024   MG 1.7 02/06/2023     Hepatic Lab Results  Component Value Date   AST 21 01/13/2024   ALT 19 01/13/2024   ALBUMIN 4.4 01/13/2024   ALKPHOS 99 01/13/2024   AMYLASE 42 07/23/2012   LIPASE 27.0 07/23/2012  ID Lab Results  Component Value Date   SARSCOV2NAA NEGATIVE 02/03/2023   STAPHAUREUS NEGATIVE 02/03/2023   MRSAPCR NEGATIVE 02/03/2023     Bone Lab Results  Component Value Date   VD25OH 25 (L) 06/13/2017     Endocrine Lab Results  Component Value Date   GLUCOSE 109 (H) 03/30/2024   GLUCOSEU NEGATIVE 02/03/2023   HGBA1C 6.5 06/22/2024   TSH 0.53 01/13/2024     Neuropathy Lab Results  Component Value Date   VITAMINB12 173 (L) 06/22/2024   HGBA1C 6.5 06/22/2024     CNS No results found for: COLORCSF, APPEARCSF, RBCCOUNTCSF, WBCCSF, POLYSCSF, LYMPHSCSF, EOSCSF, PROTEINCSF, GLUCCSF, JCVIRUS, CSFOLI, IGGCSF, LABACHR, ACETBL   Inflammation (CRP: Acute  ESR: Chronic) Lab Results  Component Value Date   CRP 0.4 (L) 12/26/2015      Rheumatology Lab Results  Component Value Date   ANA Negative 12/27/2015     Coagulation Lab Results  Component Value Date   INR 1.1 02/03/2023   LABPROT 13.7 02/03/2023   PLT 189.0 01/13/2024   DDIMER 1.51 (H) 05/20/2013     Cardiovascular Lab Results  Component Value Date   CKTOTAL 91 07/09/2013   TROPONINI <0.30 05/21/2013   HGB 14.7 01/13/2024   HCT 43.7 01/13/2024     Screening Lab Results  Component Value Date   SARSCOV2NAA NEGATIVE 02/03/2023   STAPHAUREUS NEGATIVE 02/03/2023   MRSAPCR NEGATIVE 02/03/2023     Cancer No results found for: CEA, CA125, LABCA2   Allergens No results found for: ALMOND, APPLE, ASPARAGUS, AVOCADO, BANANA, BARLEY, BASIL, BAYLEAF, GREENBEAN, LIMABEAN, WHITEBEAN, BEEFIGE, REDBEET, BLUEBERRY, BROCCOLI, CABBAGE, MELON, CARROT, CASEIN, CASHEWNUT, CAULIFLOWER, CELERY     Note: Lab results reviewed.  PFSH  Drug: Ms. Dohrmann  reports no history of drug use. Alcohol:  reports no history of alcohol use. Tobacco:  reports that she has never smoked. She has never used smokeless tobacco. Medical:  has a past medical history of Allergy, Anxiety, Arthritis, Cancer (HCC), Cataract, COPD (chronic obstructive pulmonary disease) (HCC), Depression, Diabetes mellitus (HCC), Fatty liver, Female stress incontinence, GERD (gastroesophageal reflux disease), Heart murmur, Hemorrhoids, History of kidney stones, Hyperlipidemia, Hyperplastic colon polyp (06/07/2008), Hypertension, Hypertriglyceridemia, IBS (irritable bowel syndrome), Lung nodule, Neuromuscular disorder (HCC), Obesity, Psoriasis, S/P TAVR (transcatheter aortic valve replacement) (02/05/2023), SCCA (squamous cell carcinoma) of skin (01/16/2022), Skin cancer, and Squamous cell carcinoma of skin (01/16/2022). Family: family history includes Breast cancer in her mother; Colon polyps in her maternal aunt; Heart disease in her maternal grandfather.  Past  Surgical History:  Procedure Laterality Date   CATARACT EXTRACTION W/PHACO Left 10/06/2014   Procedure: CATARACT EXTRACTION PHACO AND INTRAOCULAR LENS PLACEMENT LEFT EYE;  Surgeon: Cherene Mania, MD;  Location: [facility];  Service: Ophthalmology;  Laterality: Left;  CDE 7.35   CATARACT EXTRACTION W/PHACO Right 11/14/2014   Procedure: CATARACT EXTRACTION PHACO AND INTRAOCULAR LENS PLACEMENT RIGHT EYE;  Surgeon: Cherene Mania, MD;  Location: [facility];  Service: Ophthalmology;  Laterality: Right;  CDE:6.39   COLONOSCOPY  06/2008   hx polyps   CYSTOSCOPY W/ RETROGRADES Right 01/21/2017   Procedure: CYSTOSCOPY WITH RETROGRADE PYELOGRAM;  Surgeon: Rosina Riis, MD;  Location: [facility];  Service: Urology;  Laterality: Right;   CYSTOSCOPY/URETEROSCOPY/HOLMIUM LASER/STENT PLACEMENT Left 01/21/2017   Procedure: CYSTOSCOPY/URETEROSCOPY/HOLMIUM LASER/STENT PLACEMENT;  Surgeon: Rosina Riis, MD;  Location: [facility];  Service: Urology;  Laterality: Left;   DORSAL COMPARTMENT RELEASE Left 06/15/2015   Procedure: LEFT WRIST DEQUERVAINS;  Surgeon: Toribio JULIANNA Chancy, MD;  Location: [facility];  Service: Orthopedics;  Laterality: Left;   HERNIA REPAIR     INTRAOPERATIVE TRANSTHORACIC ECHOCARDIOGRAM N/A 02/05/2023   Procedure: INTRAOPERATIVE TRANSTHORACIC ECHOCARDIOGRAM;  Surgeon: Wonda Sharper, MD;  Location: [facility]VAS[facility] CV LAB;  Service: Open Heart Surgery;  Laterality: N/A;   MOUTH SURGERY     tooth ext   NASAL SINUS SURGERY     RIGHT/LEFT HEART CATH AND CORONARY ANGIOGRAPHY N/A 01/08/2023   Procedure: RIGHT/LEFT HEART CATH AND CORONARY ANGIOGRAPHY;  Surgeon: Wonda Sharper, MD;  Location: [facility] INVASIVE CV LAB;  Service: Cardiovascular;  Laterality: N/A;   SHOULDER SURGERY     right   TONSILLECTOMY     TRANSCATHETER AORTIC VALVE REPLACEMENT, TRANSFEMORAL Right 02/05/2023   Procedure: Transcatheter Aortic Valve Replacement, Transfemoral;  Surgeon: Wonda Sharper, MD;  Location: [facility] INVASIVE CV LAB;  Service:  Open Heart Surgery;  Laterality: Right;   UPPER GI ENDOSCOPY     normal per patient   VENTRAL HERNIA REPAIR     Active Ambulatory Problems    Diagnosis Date Noted   HLD (hyperlipidemia) 05/17/2008   Essential hypertension 05/17/2008   Hemorrhoids 05/17/2008   GERD 05/17/2008   IRRITABLE BOWEL SYNDROME 05/18/2008   FATTY LIVER DISEASE 05/17/2008   Osteoarthritis 05/17/2008   Diarrhea 05/17/2008   STRESS INCONTINENCE 05/17/2008   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   Diabetic neuropathy (HCC) 04/02/2017   Aortic valve sclerosis 04/02/2017   Upper airway cough syndrome 02/12/2018   Dysphagia 09/15/2019   Eosinophilic esophagitis 06/01/2021   Adrenal adenoma, left 11/08/2022   Hepatic steatosis 11/08/2022   Recurrent pansinusitis 11/08/2022   Heart murmur 11/11/2022   Psoriasis 11/11/2022   DOE (dyspnea on exertion) 11/11/2022   Pulmonary nodule less than 6 mm determined by computed tomography of lung 11/11/2022   Simvastatin induced neuropathy 11/11/2022   Statin intolerance 11/11/2022   Severe aortic stenosis 01/01/2023   Mitral stenosis 01/01/2023   Hypertrophic cardiomyopathy (HCC) 01/01/2023   Primary osteoarthritis of both first carpometacarpal joints 05/17/2020   Anemia 01/14/2023   S/P TAVR (transcatheter aortic valve replacement) 02/05/2023   Left carotid bruit 07/21/2023   Numbness and tingling of both upper extremities 07/21/2023   Pulmonary vascular congestion 07/21/2023   Pulsatile tinnitus of left ear 01/13/2024   Cervical radiculopathy 01/13/2024   Bilateral hearing loss 01/13/2024   Excessive daytime sleepiness 01/13/2024   Chronic diastolic heart failure (HCC) 02/25/2024   Non-recurrent acute suppurative otitis media of left ear with spontaneous rupture of tympanic membrane 02/27/2024   Nasal polyps 02/27/2024   Seasonal allergic rhinitis due to pollen 02/27/2024   History of IBS 06/29/2024   Resolved Ambulatory  Problems    Diagnosis Date Noted   PSORIASIS 05/17/2008   Chest pain 05/20/2013   Pulmonary nodule 05/21/2013   Elevated d-dimer 05/21/2013   Chronic erosive gastritis 12/06/2013   Nephrolithiasis 02/03/2017   COPD (chronic obstructive pulmonary disease) (HCC) 04/02/2017   Bronchiectasis without complication (HCC) 02/12/2018   Acute non-recurrent pansinusitis 06/01/2021   Wound healing, delayed 01/24/2022   Risk of exposure to Lyme disease 05/01/2022   Acute non-recurrent frontal sinusitis 09/15/2022   Skin lesion of left ear 11/11/2022   Abnormal breath sounds 07/16/2023   Sinus pressure 08/05/2023   Past Medical History:  Diagnosis Date   Allergy    Anxiety    Arthritis    Cancer (HCC)    Cataract    Depression    Diabetes mellitus (HCC)    Fatty liver  Female stress incontinence    GERD (gastroesophageal reflux disease)    Hemorrhoids    History of kidney stones    Hyperlipidemia    Hyperplastic colon polyp 06/07/2008   Hypertension    Hypertriglyceridemia    IBS (irritable bowel syndrome)    Lung nodule    Neuromuscular disorder (HCC)    Obesity    SCCA (squamous cell carcinoma) of skin 01/16/2022   Skin cancer    Squamous cell carcinoma of skin 01/16/2022   Constitutional Exam  General appearance: Well nourished, well developed, and well hydrated. In no apparent acute distress Vitals:   08/31/24 0824  BP: (!) 156/67  Pulse: 82  Resp: 16  Temp: (!) 97.3 F (36.3 C)  SpO2: 96%  Weight: 165 lb (74.8 kg)  Height: 5' 2 (1.575 m)   BMI Assessment: Estimated body mass index is 30.18 kg/m as calculated from the following:   Height as of this encounter: 5' 2 (1.575 m).   Weight as of this encounter: 165 lb (74.8 kg).  BMI interpretation table: BMI level Category Range association with higher incidence of chronic pain  <18 kg/m2 Underweight   18.5-24.9 kg/m2 Ideal body weight   25-29.9 kg/m2 Overweight Increased incidence by 20%  30-34.9 kg/m2 Obese  (Class I) Increased incidence by 68%  35-39.9 kg/m2 Severe obesity (Class II) Increased incidence by 136%  >40 kg/m2 Extreme obesity (Class III) Increased incidence by 254%   Patient's current BMI Ideal Body weight  Body mass index is 30.18 kg/m. Ideal body weight: 50.1 kg (110 lb 7.2 oz) Adjusted ideal body weight: 60 kg (132 lb 4.3 oz)   BMI Readings from Last 4 Encounters:  08/31/24 30.18 kg/m  06/29/24 31.53 kg/m  06/22/24 31.09 kg/m  02/27/24 32.63 kg/m   Wt Readings from Last 4 Encounters:  08/31/24 165 lb (74.8 kg)  06/29/24 172 lb 6.4 oz (78.2 kg)  06/22/24 170 lb (77.1 kg)  02/27/24 178 lb 6.4 oz (80.9 kg)    Psych/Mental status: Alert, oriented x 3 (person, place, & time)       Eyes: PERLA Respiratory: No evidence of acute respiratory distress  Lower Extremity Exam    Side: Right lower extremity  Side: Left lower extremity  Stability: No instability observed          Stability: No instability observed          Skin & Extremity Inspection: Skin color, temperature, and hair growth are WNL. No peripheral edema or cyanosis. No masses, redness, swelling, asymmetry, or associated skin lesions. No contractures.  Skin & Extremity Inspection: Skin color, temperature, and hair growth are WNL. No peripheral edema or cyanosis. No masses, redness, swelling, asymmetry, or associated skin lesions. No contractures.  Functional ROM: Decreased ROM                  Functional ROM: Decreased ROM                  Muscle Tone/Strength: Functionally intact. No obvious neuro-muscular anomalies detected.  Muscle Tone/Strength: Functionally intact. No obvious neuro-muscular anomalies detected.  Sensory (Neurological): Neuropathic pain pattern        Sensory (Neurological): Neuropathic pain pattern        DTR: Patellar: deferred today Achilles: deferred today Plantar: deferred today  DTR: Patellar: deferred today Achilles: deferred today Plantar: deferred today  Palpation: No palpable  anomalies  Palpation: No palpable anomalies    Assessment  Primary Diagnosis & Pertinent Problem List: The primary  encounter diagnosis was Chronic painful diabetic neuropathy (HCC). Diagnoses of Neuropathic pain of lower extremity and Chronic pain syndrome were also pertinent to this visit.  Visit Diagnosis (New problems to examiner): 1. Chronic painful diabetic neuropathy (HCC)   2. Neuropathic pain of lower extremity   3. Chronic pain syndrome    Plan of Care (Initial workup plan)  Assessment and Plan    Painful diabetic polyneuropathy of both feet   Chronic painful diabetic polyneuropathy affects both feet, causing severe neuropathic pain with burning, tingling, and itching. Gabapentin  previously caused sedation and fogginess without significant relief. Schedule Qutenza  (capsaicin  8%) treatment in the clinic within 1-2 weeks. Apply Qutenza  to the feet for 30-40 minutes. Repeat treatment every 3 months for up to one year. Consider more invasive spinal options if Qutenza  proves ineffective such as SCS.      Procedure Orders         NEUROLYSIS        Provider-requested follow-up: Return in about 8 days (around 09/08/2024) for Qutenza .  Future Appointments  Date Time Provider Department Center  09/08/2024  1:20 PM Marcelino Nurse, MD [facility] None  09/14/2024 10:00 AM [facility] CLINICAL SUPPORT [facility] [address]  09/15/2024  1:45 PM Hyatt, Max T, DPM [facility]  02/11/2025  8:10 AM [facility] ANNUAL WELLNESS VISIT 1 [facility] [address]   I discussed the assessment and treatment plan with the patient. The patient was provided an opportunity to ask questions and all were answered. The patient agreed with the plan and demonstrated an understanding of the instructions.  Patient advised to call back or seek an in-person evaluation if the symptoms or condition worsens.  I personally spent a total of 40 minutes in the care of the patient today including preparing to see the patient,  getting/reviewing separately obtained history, performing a medically appropriate exam/evaluation, counseling and educating, placing orders, and documenting clinical information in the EHR.   Note by: Nurse Marcelino, MD (TTS and AI technology used. I apologize for any typographical errors that were not detected and corrected.) Date: 08/31/2024; Time: 9:50 AM

## 2024-08-31 NOTE — Progress Notes (Signed)
 Safety precautions to be maintained throughout the outpatient stay will include: orient to surroundings, keep bed in low position, maintain call bell within reach at all times, provide assistance with transfer out of bed and ambulation.

## 2024-09-03 ENCOUNTER — Other Ambulatory Visit: Payer: Self-pay | Admitting: *Deleted

## 2024-09-03 DIAGNOSIS — E1149 Type 2 diabetes mellitus with other diabetic neurological complication: Secondary | ICD-10-CM

## 2024-09-03 MED ORDER — OZEMPIC (2 MG/DOSE) 8 MG/3ML ~~LOC~~ SOPN: PEN_INJECTOR | SUBCUTANEOUS | 0 refills | Status: DC

## 2024-09-08 ENCOUNTER — Ambulatory Visit
Attending: Student in an Organized Health Care Education/Training Program | Admitting: Student in an Organized Health Care Education/Training Program

## 2024-09-08 ENCOUNTER — Encounter: Payer: Self-pay | Admitting: Student in an Organized Health Care Education/Training Program

## 2024-09-08 VITALS — BP 139/67 | HR 83 | Temp 98.4°F | Resp 16 | Ht 62.0 in | Wt 160.0 lb

## 2024-09-08 DIAGNOSIS — E1140 Type 2 diabetes mellitus with diabetic neuropathy, unspecified: Secondary | ICD-10-CM | POA: Diagnosis not present

## 2024-09-08 DIAGNOSIS — G894 Chronic pain syndrome: Secondary | ICD-10-CM | POA: Insufficient documentation

## 2024-09-08 DIAGNOSIS — M792 Neuralgia and neuritis, unspecified: Secondary | ICD-10-CM | POA: Diagnosis not present

## 2024-09-08 MED ORDER — CAPSAICIN-CLEANSING GEL 8 % EX KIT
4.0000 | PACK | Freq: Once | CUTANEOUS | Status: DC
  Filled 2024-09-08: qty 4

## 2024-09-08 NOTE — Progress Notes (Signed)
 PROVIDER NOTE: Interpretation of information contained herein should be left to medically-trained personnel. Specific patient instructions are provided elsewhere under Patient Instructions section of medical record. This document was created in part using STT-dictation technology, any transcriptional errors that may result from this process are unintentional.  Patient: Abigail [name] Type: Established DOB: May 25, 1951 MRN: [id number] PCP: Corwin Antu, FNP  Service: Procedure DOS: 09/08/2024 Setting: Ambulatory Location: Ambulatory outpatient facility Delivery: Face-to-face Provider: Wallie Sherry, MD Specialty: Interventional Pain Management Specialty designation: 09 Location: Outpatient facility Ref. Prov.: Corwin Antu, FNP       Interventional Therapy   Type: Qutenza  Neurolysis #1  Laterality:  Bilateral Area treated: Feet Imaging Guidance: None Anesthesia/analgesia/anxiolysis/sedation: None required Medication (Right): Qutenza  (capsaicin  8%) topical system Medication (Left): Qutenza  (capsaicin  8%) topical system Date: 09/08/2024 Performed by: Wallie Sherry, MD Rationale (medical necessity): procedure needed and proper for the treatment of Abigail [name]'s medical symptoms and needs. 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 1. Chronic painful diabetic neuropathy (HCC)   2. Neuropathic pain of lower extremity   3. Chronic pain syndrome    NAS-11 Pain score:   Pre-procedure: 7 /10   Post-procedure: 7 /10     Position / Prep / Materials:  Position: Supine  Materials: Qutenza  Kit (4 patches)  H&P (Pre-op Assessment):  Abigail [name] is a 73 y.o. (year old), female patient, seen today for interventional treatment. 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 Abigail [name] has a current medication list which includes the following prescription(s): albuterol , amoxicillin , aspirin , azelastine hcl, budesonide -formoterol , diltiazem , duloxetine , repatha  sureclick, fluticasone , furosemide , gabapentin , freestyle lite, glucose monitoring kit, hyoscyamine , freestyle, lisinopril , meclizine , mometasone, ozempic  (2 mg/dose), and skyrizi pen, and the following Facility-Administered Medications: capsaicin  topical system. Her primarily concern today is the Pain  Initial Vital Signs:  Pulse/HCG Rate: 83  Temp: 98.4 F (36.9 C) Resp: 16 BP: 139/67 SpO2: 97 %  BMI: Estimated body mass index is 29.26 kg/m as calculated from the following:   Height as of this encounter: 5' 2 (1.575 m).   Weight as of this encounter: 160 lb (72.6 kg).  Risk Assessment: Allergies: Reviewed. She is allergic to statins, cinnamon, and farxiga  [dapagliflozin ].  Allergy Precautions: None required Coagulopathies: Reviewed. None identified.  Blood-thinner therapy: None at this time Active Infection(s): Reviewed. None identified. Abigail [name] is afebrile  Site Confirmation: Abigail [name] was asked to confirm the procedure and laterality before marking the site Procedure checklist: Completed Consent: Before the procedure and under the influence of no sedative(s), amnesic(s), or anxiolytics, the patient was informed of the treatment options, risks and possible complications. To fulfill our ethical and legal obligations, as recommended by the  American Medical Association's Code of Ethics, I have informed the patient of my clinical impression; the nature and purpose of the treatment or procedure; the risks, benefits, and possible complications of the intervention; the alternatives, including doing nothing; the risk(s) and benefit(s) of the alternative treatment(s) or procedure(s); and the risk(s) and benefit(s) of doing nothing. The patient was provided information about the general risks and possible complications  associated with the procedure. These may include, but are not limited to: failure to achieve desired goals, infection, bleeding, organ or nerve damage, allergic reactions, paralysis, and death. In addition, the patient was informed of those risks and complications associated to the procedure, such as failure to decrease pain; infection; bleeding; organ or nerve damage with subsequent damage to sensory, motor, and/or autonomic systems, resulting in permanent pain, numbness, and/or weakness of one or several areas of the body; allergic reactions; (i.e.: anaphylactic reaction); and/or death. Furthermore, the patient was informed of those risks and complications associated with the medications. These include, but are not limited to: allergic reactions (i.e.: anaphylactic or anaphylactoid reaction(s)); adrenal axis suppression; blood sugar elevation that in diabetics may result in ketoacidosis or comma; water retention that in patients with history of congestive heart failure may result in shortness of breath, pulmonary edema, and decompensation with resultant heart failure; weight gain; swelling or edema; medication-induced neural toxicity; particulate matter embolism and blood vessel occlusion with resultant organ, and/or nervous system infarction; and/or aseptic necrosis of one or more joints. Finally, the patient was informed that Medicine is not an exact science; therefore, there is also the possibility of unforeseen or unpredictable risks  and/or possible complications that may result in a catastrophic outcome. The patient indicated having understood very clearly. We have given the patient no guarantees and we have made no promises. Enough time was given to the patient to ask questions, all of which were answered to the patient's satisfaction. Ms. Egle has indicated that she wanted to continue with the procedure. Attestation: I, the ordering provider, attest that I have discussed with the patient the benefits, risks, side-effects, alternatives, likelihood of achieving goals, and potential problems during recovery for the procedure that I have provided informed consent. Date  Time: 09/08/2024 12:35 PM  Pre-Procedure Preparation:  Monitoring: As per clinic protocol. Respiration, ETCO2, SpO2, BP, heart rate and rhythm monitor placed and checked for adequate function Safety Precautions: Patient was assessed for positional comfort and pressure points before starting the procedure. Time-out: I initiated and conducted the Time-out before starting the procedure, as per protocol. The patient was asked to participate by confirming the accuracy of the Time Out information. Verification of the correct person, site, and procedure were performed and confirmed by me, the nursing staff, and the patient. Time-out conducted as per Joint Commission's Universal Protocol (UP.01.01.01). Time: 1310 Start Time: 1310 hrs.  Description/Narrative of Procedure:          Region: Distal lower extremities Target Area: Sensory peripheral nerves affected by diabetic peripheral neuropathy Site: Feet Approach: Percutaneous  No./Series: Not applicable  Type: Percutaneous  Purpose: Therapeutic  Region: Distal lower extremities  Start Time: 1310 hrs.  Description of the Procedure: Protocol guidelines were followed. The patient was assisted into a comfortable position.  Informed consent was obtained in the patient monitored in the usual manner.  All questions  were answered prior to the procedure.  They Qutenza  patches were applied to the affected area and then covered with the wrap.  The Patient was kept under observation until the treatment was completed.  The patches were removed and the treated area was inspected.  Vitals:   09/08/24 1253  BP: 139/67  Pulse: 83  Resp: 16  Temp: 98.4 F (36.9 C)  SpO2: 97%  Weight: 160 lb (72.6 kg)  Height: 5' 2 (1.575 m)     End Time:   hrs.  Type of Imaging Technique: None used Indication(s): N/A Exposure Time:  No patient exposure Contrast: None used. Fluoroscopic Guidance: N/A Ultrasound Guidance: N/A Interpretation: N/A  Post-operative Assessment:  Post-procedure Vital Signs:  Pulse/HCG Rate: 83  Temp: 98.4 F (36.9 C) Resp: 16 BP: 139/67 SpO2: 97 %  EBL: None  Complications: No immediate post-treatment complications observed by team, or reported by patient.  Note: The patient tolerated the entire procedure well. A repeat set of vitals were taken after the procedure and the patient was kept under observation following institutional policy, for this type of procedure. Post-procedural neurological assessment was performed, showing return to baseline, prior to discharge. The patient was provided with post-procedure discharge instructions, including a section on how to identify potential problems. Should any problems arise concerning this procedure, the patient was given instructions to immediately contact us , at any time, without hesitation. In any case, we plan to contact the patient by telephone for a follow-up status report regarding this interventional procedure.  Comments:  No additional relevant information.  Plan of Care (POC)  Orders:  Orders Placed This Encounter  Procedures   NEUROLYSIS    Please order Qutenza  patches    Standing Status:   Future    Expiration Date:   12/09/2024    Where will this procedure be performed?:   [facility]    Medications ordered for  procedure: Meds ordered this encounter  Medications   capsaicin  topical system 8 % patch 4 patch   Medications administered: Abigail [name] had no medications administered during this visit.  See the medical record for exact dosing, route, and time of administration.    Follow-up plan:   Return in about 3 months (around 12/09/2024) for Qutenza  #2.     Recent Visits Date Type Provider Dept  08/31/24 Office Visit Marcelino Nurse, MD [facility]  Showing recent visits within past 90 days and meeting all other requirements Today's Visits Date Type Provider Dept  09/08/24 Procedure visit Marcelino Nurse, MD [facility]  Showing today's visits and meeting all other requirements Future Appointments No visits were found meeting these conditions. Showing future appointments within next 90 days and meeting all other requirements   Disposition: Discharge home  Discharge (Date  Time): 09/08/2024;   hrs.   Primary Care Physician: Corwin Antu, FNP Location: [facility] Note by: Nurse Marcelino, MD (TTS technology used. I apologize for any typographical errors that were not detected and corrected.) Date: 09/08/2024; Time: 3:38 PM  Disclaimer:  Medicine is not an visual merchandiser. The only guarantee in medicine is that nothing is guaranteed. It is important to note that the decision to proceed with this intervention was based on the information collected from the patient. The Data and conclusions were drawn from the patient's questionnaire, the interview, and the physical examination. Because the information was provided in large part by the patient, it cannot be guaranteed that it has not been purposely or unconsciously manipulated. Every effort has been made to obtain as much relevant data as possible for this evaluation. It is important to note that the conclusions that lead to this procedure are derived in large part from the available data. Always take  into account that the treatment will also be dependent on availability of resources and existing treatment guidelines, considered by other Pain Management Practitioners as being common knowledge and practice, at the time of the intervention. For Medico-Legal purposes, it is also important to point out that variation in procedural techniques and pharmacological choices are the acceptable norm. The indications,  contraindications, technique, and results of the above procedure should only be interpreted and judged by a Board-Certified Interventional Pain Specialist with extensive familiarity and expertise in the same exact procedure and technique.

## 2024-09-08 NOTE — Progress Notes (Signed)
 Safety precautions to be maintained throughout the outpatient stay will include: orient to surroundings, keep bed in low position, maintain call bell within reach at all times, provide assistance with transfer out of bed and ambulation.

## 2024-09-09 ENCOUNTER — Telehealth: Payer: Self-pay

## 2024-09-09 NOTE — Telephone Encounter (Signed)
 Post procedure follow up. Patient states she is doing fine.

## 2024-09-10 ENCOUNTER — Other Ambulatory Visit: Payer: Self-pay | Admitting: *Deleted

## 2024-09-10 DIAGNOSIS — M150 Primary generalized (osteo)arthritis: Secondary | ICD-10-CM

## 2024-09-10 MED ORDER — DULOXETINE HCL 20 MG PO CPEP
20.0000 mg | ORAL_CAPSULE | Freq: Every day | ORAL | 0 refills | Status: AC
Start: 2024-09-10 — End: ?

## 2024-09-14 ENCOUNTER — Telehealth: Payer: Self-pay | Admitting: Family

## 2024-09-14 ENCOUNTER — Ambulatory Visit (INDEPENDENT_AMBULATORY_CARE_PROVIDER_SITE_OTHER)

## 2024-09-14 DIAGNOSIS — E538 Deficiency of other specified B group vitamins: Secondary | ICD-10-CM | POA: Diagnosis not present

## 2024-09-14 MED ORDER — CYANOCOBALAMIN 1000 MCG/ML IJ SOLN
1000.0000 ug | Freq: Once | INTRAMUSCULAR | Status: AC
  Administered 2024-09-14: 1000 ug via INTRAMUSCULAR

## 2024-09-14 NOTE — Telephone Encounter (Signed)
 Spoke with pt. States that she does not want Janae to be the one to do her B12 injection this morning. Advised her that there will be someone else administering her injection this morning. Nothing further was needed at this time.

## 2024-09-14 NOTE — Telephone Encounter (Signed)
 Copied from CRM #[id number]. Topic: General - Other >> Sep 14, 2024  8:11 AM Mesmerise C wrote: Reason for CRM: Patient states at the last appointment for her B12 injection that was administer by Janae she was in pain for 15 minutes would prefer at her appointment today she didn't administer it this time, also left a message in appt notes

## 2024-09-14 NOTE — Progress Notes (Signed)
 Per orders of Ginger Patrick, NP, injection of vitamin b 12 given by Laray Arenas in right deltoid. Patient tolerated injection well. This was pt's third Vit B 12 inj.,

## 2024-09-15 ENCOUNTER — Ambulatory Visit: Admitting: Podiatry

## 2024-10-07 ENCOUNTER — Other Ambulatory Visit: Payer: Self-pay | Admitting: *Deleted

## 2024-10-07 DIAGNOSIS — Z789 Other specified health status: Secondary | ICD-10-CM

## 2024-10-07 DIAGNOSIS — T466X5A Adverse effect of antihyperlipidemic and antiarteriosclerotic drugs, initial encounter: Secondary | ICD-10-CM

## 2024-10-07 DIAGNOSIS — E782 Mixed hyperlipidemia: Secondary | ICD-10-CM

## 2024-10-07 MED ORDER — REPATHA SURECLICK 140 MG/ML ~~LOC~~ SOAJ: SUBCUTANEOUS | 5 refills | Status: AC

## 2024-10-25 ENCOUNTER — Other Ambulatory Visit: Payer: Self-pay | Admitting: *Deleted

## 2024-10-25 MED ORDER — FLUTICASONE PROPIONATE 50 MCG/ACT NA SUSP: 1.0000 | Freq: Two times a day (BID) | NASAL | 2 refills | Status: AC

## 2024-11-02 ENCOUNTER — Telehealth: Payer: Self-pay

## 2024-11-02 ENCOUNTER — Other Ambulatory Visit (HOSPITAL_COMMUNITY): Payer: Self-pay

## 2024-11-02 NOTE — Telephone Encounter (Signed)
 Pharmacy Patient Advocate Encounter   Received notification from Onbase that prior authorization for Ozempic  8 is required/requested.   Insurance verification completed.   The patient is insured through [facility].   Per test claim: PA required; PA started via CoverMyMeds. KEY [id number] . Waiting for clinical questions to populate.

## 2024-11-12 ENCOUNTER — Telehealth: Payer: Self-pay | Admitting: Internal Medicine

## 2024-11-12 NOTE — Telephone Encounter (Signed)
 Per Dr.Mallipeddi response:  Yes she needs antibiotic prior to dental procedures due to the presence of aortic prosthetic valve. She should have amoxicillin  with her or dentist can also prescribe.   Advised patient of response. And if she needed a refill to let us  know. Patient verbalized understanding

## 2024-11-12 NOTE — Telephone Encounter (Signed)
 Patient would like to know if she needs to take antibiotic for 2/05 dental extraction. She says dentist is not requesting clearance. She just wants to know for herself. Please advise.

## 2024-11-22 ENCOUNTER — Telehealth: Payer: Self-pay | Admitting: Pharmacy Technician

## 2024-11-22 NOTE — Telephone Encounter (Signed)
 Pharmacy Patient Advocate Encounter   Received notification from Fax that prior authorization for repatha is required/requested.   Insurance verification completed.   The patient is insured through [employer] .   Per test claim: PA required; PA submitted to above mentioned insurance via Fax Key/confirmation #/EOC faxed Status is pending

## 2024-11-23 ENCOUNTER — Other Ambulatory Visit (HOSPITAL_COMMUNITY): Payer: Self-pay

## 2024-11-23 NOTE — Telephone Encounter (Signed)
 Pharmacy Patient Advocate Encounter  Received notification from [employer] that Prior Authorization for repatha  has been APPROVED from 11/22/24 to 11/22/25. Unable to obtain price due to refill too soon rejection, last fill date 11/10/24 next available fill date01/28/26   PA #/Case ID/Reference #: [id number]

## 2024-12-08 ENCOUNTER — Ambulatory Visit: Admitting: Student in an Organized Health Care Education/Training Program

## 2024-12-09 ENCOUNTER — Other Ambulatory Visit: Payer: Self-pay | Admitting: *Deleted

## 2024-12-09 DIAGNOSIS — E1149 Type 2 diabetes mellitus with other diabetic neurological complication: Secondary | ICD-10-CM

## 2024-12-09 MED ORDER — OZEMPIC (2 MG/DOSE) 8 MG/3ML ~~LOC~~ SOPN: PEN_INJECTOR | SUBCUTANEOUS | 1 refills | Status: AC

## 2024-12-20 ENCOUNTER — Ambulatory Visit: Admitting: Student in an Organized Health Care Education/Training Program

## 2025-02-11 ENCOUNTER — Ambulatory Visit
